# Patient Record
Sex: Male | Born: 1985 | Race: White | Hispanic: No | Marital: Married | Smoking: Never smoker
Health system: Southern US, Community
[De-identification: ages and names within clinical notes are randomized; demographics above are authoritative.]

## PROBLEM LIST (undated history)

## (undated) DIAGNOSIS — F509 Eating disorder, unspecified: Secondary | ICD-10-CM

## (undated) DIAGNOSIS — F1010 Alcohol abuse, uncomplicated: Secondary | ICD-10-CM

## (undated) DIAGNOSIS — F32A Depression, unspecified: Secondary | ICD-10-CM

## (undated) DIAGNOSIS — R569 Unspecified convulsions: Secondary | ICD-10-CM

## (undated) DIAGNOSIS — I10 Essential (primary) hypertension: Secondary | ICD-10-CM

## (undated) HISTORY — DX: Depression, unspecified: F32.A

## (undated) HISTORY — DX: Essential (primary) hypertension: I10

## (undated) HISTORY — DX: Eating disorder, unspecified: F50.9

---
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
## 2009-11-29 ENCOUNTER — Emergency Department: Payer: Self-pay | Admitting: Internal Medicine

## 2010-02-25 ENCOUNTER — Emergency Department: Payer: Self-pay | Admitting: Unknown Physician Specialty

## 2010-04-26 ENCOUNTER — Emergency Department: Payer: Self-pay | Admitting: Emergency Medicine

## 2010-06-15 ENCOUNTER — Emergency Department (HOSPITAL_COMMUNITY): Admission: EM | Admit: 2010-06-15 | Discharge: 2010-06-15 | Payer: Self-pay | Admitting: Emergency Medicine

## 2010-06-25 ENCOUNTER — Ambulatory Visit: Payer: Self-pay | Admitting: Family Medicine

## 2010-06-25 DIAGNOSIS — I10 Essential (primary) hypertension: Secondary | ICD-10-CM | POA: Insufficient documentation

## 2010-06-25 HISTORY — DX: Essential (primary) hypertension: I10

## 2010-11-30 ENCOUNTER — Telehealth: Payer: Self-pay | Admitting: Family Medicine

## 2010-12-24 NOTE — Assessment & Plan Note (Signed)
Summary: BRAND NEW PT/TO EST/CJR   Vital Signs:  Patient profile:   25 year old male Height:      66.25 inches Weight:      189 pounds BMI:     30.39 Temp:     98.4 degrees F oral Pulse rate:   80 / minute Pulse rhythm:   regular Resp:     12 per minute BP sitting:   162 / 100  (left arm) Cuff size:   regular  Vitals Entered By: Sid Falcon LPN (June 25, 2010 11:15 AM)  Nutrition Counseling: Patient's BMI is greater than 25 and therefore counseled on weight management options.    Serial Vital Signs/Assessments:  Time      Position  BP       Pulse  Resp  Temp     By                     154/100                        Evelena Peat MD  CC: New to establish   History of Present Illness: New pt to establish care.  Dxed about 2 months ago with severe hypertension.  Seen at [facility] and started  on Metoprolol and HCTZ.  BP initially reported at 240/130 per pt.  Not well controlled since then. Ran out of metoprolol today.  Has had some fatigue and headache issues since increased BP noted. Dealing with stress of mother dxed with lung cancer recently.  Has occ GERD but no other medical problems. Denies smoking, ETOH, or ilicit drug use.   Preventive Screening-Counseling & Management  Alcohol-Tobacco     Smoking Status: never  Caffeine-Diet-Exercise     Does Patient Exercise: no  Allergies (verified): No Known Drug Allergies  Past History:  Family History: Last updated: 06/25/2010 Family History Hypertension, father Family History Lung cancer, mother  Social History: Last updated: 06/25/2010 Occupation:  unemployed Single Never Smoked Alcohol use-no Regular exercise-no  Risk Factors: Exercise: no (06/25/2010)  Risk Factors: Smoking Status: never (06/25/2010)  Past Medical History:  eating disorder-?past hx of bulemia Hypertension PMH-FH-SH reviewed for relevance  Family History: Family History Hypertension, father Family History  Lung cancer, mother  Social History: Occupation:  unemployed Single Never Smoked Alcohol use-no Regular exercise-no Smoking Status:  never Occupation:  employed Does Patient Exercise:  no  Review of Systems  The patient denies anorexia, weight loss, weight gain, vision loss, chest pain, syncope, dyspnea on exertion, and peripheral edema.    Physical Exam  General:  Well-developed,well-nourished,in no acute distress; alert,appropriate and cooperative throughout examination Head:  Normocephalic and atraumatic without obvious abnormalities. No apparent alopecia or balding. Eyes:  pupils equal, pupils round, and pupils reactive to light.  Fundi benign-no hemorrhages. Neck:  No deformities, masses, or tenderness noted. Lungs:  Normal respiratory effort, chest expands symmetrically. Lungs are clear to auscultation, no crackles or wheezes. Heart:  normal rate, regular rhythm, and no murmur.   Extremities:  no edema. Neurologic:  alert & oriented X3, cranial nerves II-XII intact, and gait normal.     Impression & Recommendations:  Problem # 1:  HYPERTENSION (ICD-401.9) not to goal.  Cont metoprolol 50 mg once daily and add lisinopril hctz 20/12.5 one daily.  He will call with BP readings in 2 weeks.  He has no insurance and we will try to manage and titrate some by home readings. The following  medications were removed from the medication list:    Hydrochlorothiazide 25 Mg Tabs (Hydrochlorothiazide) ..... Once daily His updated medication list for this problem includes:    Metoprolol Tartrate 50 Mg Tabs (Metoprolol tartrate) ..... Once daily    Lisinopril-hydrochlorothiazide 20-12.5 Mg Tabs (Lisinopril-hydrochlorothiazide) ..... One by mouth once daily  Complete Medication List: 1)  Metoprolol Tartrate 50 Mg Tabs (Metoprolol tartrate) .... Once daily 2)  Lisinopril-hydrochlorothiazide 20-12.5 Mg Tabs (Lisinopril-hydrochlorothiazide) .... One by mouth once daily  Patient  Instructions: 1)  Check your  Blood Pressure regularly . If it is above:140/90   you should make an appointment. 2)  Limit your Sodium(salt) .  3)  Call in 2 weeks to give feedback about blood pressure. Prescriptions: METOPROLOL TARTRATE 50 MG TABS (METOPROLOL TARTRATE) once daily  #30 x 6   Entered and Authorized by:   Evelena Peat MD   Signed by:   Evelena Peat MD on 06/25/2010   Method used:   Electronically to        [facility] [address] [phone]* (retail)       [address] [place]  [address]       Ph: [phone]       Fax: [phone]   RxID:   [id number] LISINOPRIL-HYDROCHLOROTHIAZIDE 20-12.5 MG TABS (LISINOPRIL-HYDROCHLOROTHIAZIDE) one by mouth once daily  #30 x 6   Entered and Authorized by:   Evelena Peat MD   Signed by:   Evelena Peat MD on 06/25/2010   Method used:   Electronically to        [facility] [address] [phone]* (retail)       [address] [place], [place]  [address]       Ph: [phone]       Fax: [phone]   RxID:   [id number]   Preventive Care Screening  Last Tetanus Booster:    Date:  04/22/2010    Results:  Tdap

## 2010-12-24 NOTE — Progress Notes (Signed)
Summary: Pt called re: getting put back on diff type of bp med  Phone Note Call from Patient Call back at [facility] Phone [phone]   Caller: Patient Summary of Call: Pt called and would like to know when he could be put back on diff type of bp med? Pls call.  Initial call taken by: Lucy Antigua,  November 30, 2010 2:17 PM  Follow-up for Phone Call        We need to know how his BP is currently on present meds.  Need to make sure he is taking both meds.  He really needs office f/u but he has no insurance and we are trying to help with costs. Follow-up by: Evelena Peat MD,  November 30, 2010 3:21 PM  Additional Follow-up for Phone Call Additional follow up Details #1::        Pt says that he has been taking both meds and bp has been around  125/100, sometimes its 140/80 something. Pls advise.  Additional Follow-up by: Lucy Antigua,  November 30, 2010 4:18 PM    Additional Follow-up for Phone Call Additional follow up Details #2::    This is greatly improved from when we saw pt.  Why does he want to change meds if working? If he feels need to chnage we really need to reassess with office follow up. Follow-up by: Evelena Peat MD,  November 30, 2010 5:52 PM  Additional Follow-up for Phone Call Additional follow up Details #3:: Details for Additional Follow-up Action Taken: Lft vm for pt,notifying him of info noted above. Waiting on call back.  Additional Follow-up by: Lucy Antigua,  December 01, 2010 9:20 AM

## 2011-01-31 ENCOUNTER — Other Ambulatory Visit: Payer: Self-pay | Admitting: Family Medicine

## 2011-02-01 ENCOUNTER — Telehealth: Payer: Self-pay | Admitting: *Deleted

## 2011-02-01 DIAGNOSIS — I10 Essential (primary) hypertension: Secondary | ICD-10-CM

## 2011-02-01 MED ORDER — LISINOPRIL-HYDROCHLOROTHIAZIDE 20-12.5 MG PO TABS
1.0000 | ORAL_TABLET | Freq: Every day | ORAL | Status: DC
Start: 2011-02-01 — End: 2012-04-04

## 2011-02-01 MED ORDER — METOPROLOL TARTRATE 50 MG PO TABS: 50.0000 mg | ORAL_TABLET | Freq: Every day | ORAL | Status: DC

## 2011-02-01 NOTE — Telephone Encounter (Signed)
Pt BP have been running 120/80.  Filled med for 5 months and informed pt he will need annual CPX in August 1012, he voiced his understanding

## 2011-02-01 NOTE — Telephone Encounter (Signed)
Metoprolol 50mg  and Lisinopril 20mg  -12.5 out of meds.  [facility] in [place].

## 2011-03-19 ENCOUNTER — Emergency Department: Payer: Self-pay | Admitting: Emergency Medicine

## 2011-07-10 ENCOUNTER — Other Ambulatory Visit: Payer: Self-pay | Admitting: Family Medicine

## 2011-07-14 ENCOUNTER — Other Ambulatory Visit: Payer: Self-pay | Admitting: *Deleted

## 2011-07-14 ENCOUNTER — Emergency Department: Payer: Self-pay | Admitting: Internal Medicine

## 2011-07-14 MED ORDER — METOPROLOL TARTRATE 50 MG PO TABS: 50.0000 mg | ORAL_TABLET | Freq: Every day | ORAL | Status: DC

## 2011-09-11 ENCOUNTER — Other Ambulatory Visit: Payer: Self-pay | Admitting: Family Medicine

## 2011-09-14 ENCOUNTER — Other Ambulatory Visit: Payer: Self-pay | Admitting: *Deleted

## 2011-09-14 NOTE — Telephone Encounter (Signed)
Refill denied, pt has not been seen since 06/2010 and has been informed several times he needs return OV

## 2011-09-27 ENCOUNTER — Emergency Department: Payer: Self-pay | Admitting: Emergency Medicine

## 2011-10-13 ENCOUNTER — Other Ambulatory Visit: Payer: Self-pay | Admitting: Family Medicine

## 2011-11-21 ENCOUNTER — Other Ambulatory Visit: Payer: Self-pay | Admitting: Family Medicine

## 2011-11-26 ENCOUNTER — Encounter: Payer: Self-pay | Admitting: Family Medicine

## 2011-11-26 ENCOUNTER — Ambulatory Visit: Payer: Self-pay | Admitting: Family Medicine

## 2012-01-20 ENCOUNTER — Ambulatory Visit: Payer: Self-pay | Admitting: Family Medicine

## 2012-01-26 ENCOUNTER — Emergency Department: Payer: Self-pay | Admitting: Emergency Medicine

## 2012-02-20 ENCOUNTER — Emergency Department: Payer: Self-pay | Admitting: Emergency Medicine

## 2012-04-04 ENCOUNTER — Other Ambulatory Visit: Payer: Self-pay | Admitting: *Deleted

## 2012-05-14 ENCOUNTER — Emergency Department: Payer: Self-pay | Admitting: Emergency Medicine

## 2012-05-14 LAB — COMPREHENSIVE METABOLIC PANEL
Albumin: 4.6 g/dL (ref 3.4–5.0)
Anion Gap: 7 (ref 7–16)
BUN: 10 mg/dL (ref 7–18)
Calcium, Total: 9.3 mg/dL (ref 8.5–10.1)
Co2: 31 mmol/L (ref 21–32)
Creatinine: 0.89 mg/dL (ref 0.60–1.30)
EGFR (African American): >60
Glucose: 79 mg/dL (ref 65–99)
Potassium: 4.4 mmol/L (ref 3.5–5.1)
SGOT(AST): 16 U/L (ref 15–37)
SGPT (ALT): 22 U/L
Sodium: 140 mmol/L (ref 136–145)
Total Protein: 8 g/dL (ref 6.4–8.2)

## 2012-05-14 LAB — CBC
MCH: 30.4 pg (ref 26.0–34.0)
MCHC: 33 g/dL (ref 32.0–36.0)
RBC: 5.44 10*6/uL (ref 4.40–5.90)
WBC: 5 10*3/uL (ref 3.8–10.6)

## 2012-09-22 ENCOUNTER — Telehealth: Payer: Self-pay | Admitting: Family Medicine

## 2012-09-22 NOTE — Telephone Encounter (Signed)
Pt called requesting refill on BP meds. He has not been seen since 2011, so I told him he would probably need to come in for [facility] w/ Dr. Caryl Never first. He states he is out of meds. I offered him an appt today, but he refused. I sched for Monday at his [facility]. He said he needed refill on atenolol, but I only see metoprolol on list.  As I was talking to him, [facility] pharmacy called and spoke w/another scheduler. They stated that this pt called them and said we had authorized an emergency refill on his medicine, and that he had an appt (he did not have appt at this time). Pharmacy stated patient has been caught stealing his dad's medicine and lying to the pharmacy.  Pt's apt is Monday 11/4. He asked how much the visit cost, so he may be uninsured, and not sure if he will keep the appt. Please advise about refill without appt, etc.

## 2012-09-22 NOTE — Telephone Encounter (Signed)
Please advise 

## 2012-09-22 NOTE — Telephone Encounter (Signed)
Needs to be seen before further refills 

## 2012-09-22 NOTE — Telephone Encounter (Signed)
Attempt to call- the number listed in the chart - "has been disconnect or changed" cant leave msg for pt to make ROV to discuss meds and RFs

## 2012-09-22 NOTE — Telephone Encounter (Signed)
error 

## 2012-09-25 ENCOUNTER — Ambulatory Visit (INDEPENDENT_AMBULATORY_CARE_PROVIDER_SITE_OTHER): Payer: Self-pay | Admitting: Family Medicine

## 2012-09-25 ENCOUNTER — Encounter: Payer: Self-pay | Admitting: Family Medicine

## 2012-09-25 VITALS — BP 142/92 | Temp 98.0°F | Wt 174.0 lb

## 2012-09-25 DIAGNOSIS — S0003XA Contusion of scalp, initial encounter: Secondary | ICD-10-CM

## 2012-09-25 DIAGNOSIS — S1093XA Contusion of unspecified part of neck, initial encounter: Secondary | ICD-10-CM

## 2012-09-25 DIAGNOSIS — I10 Essential (primary) hypertension: Secondary | ICD-10-CM

## 2012-09-25 DIAGNOSIS — S0083XA Contusion of other part of head, initial encounter: Secondary | ICD-10-CM

## 2012-09-25 MED ORDER — LOSARTAN POTASSIUM 50 MG PO TABS: 50.0000 mg | ORAL_TABLET | Freq: Every day | ORAL | Status: DC

## 2012-09-25 MED ORDER — HYDROCODONE-ACETAMINOPHEN 5-325 MG PO TABS: ORAL_TABLET | ORAL | Status: DC

## 2012-09-25 MED ORDER — ATENOLOL 50 MG PO TABS: ORAL_TABLET | ORAL | Status: DC

## 2012-09-25 NOTE — Progress Notes (Signed)
  Subjective:    Patient ID: Adam [name], male    DOB: [birth year], 26 y.o.   MRN: [id number]  HPI  Patient seen for evaluation of hypertension. Had hypertension for quite some time and fell out of followup after losing insurance. He has recently been on atenolol 50 mg twice daily. He has some fatigue issues and erectile dysfunction.  Would like to explore other options. He apparently had tendency toward high pulse in the past. He denies any history of asthma. No history of depression. Previously was on lisinopril HCTZ but apparently still had somewhat borderline elevated blood pressure on that.  Accidentally hit left jaw with door 4 days ago. Pain with eating. Taking 800 mg ibuprofen but still had considerable soreness. Pain with chewing. No visible bruising.  Difficulty sleeping secondary to pain.   Review of Systems  Constitutional: Negative for fatigue.  Eyes: Negative for visual disturbance.  Respiratory: Negative for cough, chest tightness and shortness of breath.   Cardiovascular: Negative for chest pain, palpitations and leg swelling.  Neurological: Negative for dizziness, syncope, weakness, light-headedness and headaches.       Objective:   Physical Exam  Constitutional: He appears well-developed and well-nourished.  HENT:       Tender left TMJ joint. No visible bruising  Neck: Neck supple. No thyromegaly present.  Cardiovascular: Normal rate and regular rhythm.   No murmur heard. Pulmonary/Chest: Effort normal and breath sounds normal. No respiratory distress. He has no wheezes. He has no rales.  Musculoskeletal: He exhibits no edema.  Lymphadenopathy:    He has no cervical adenopathy.          Assessment & Plan:  #1 hypertension. Currently not well controlled. Start losartan 50 mg once daily. Continue atenolol but reduced to 50 mg one half tablet twice daily. He'll call with blood pressure readings in the next few weeks. #2 contusion left TMJ joint. Limited  hydrocodone 5/325 mg one or 2 every 6 hours when necessary for severe pain (disp #30 with no refill). Consider x-rays if not improving over the next week

## 2012-09-25 NOTE — Patient Instructions (Addendum)
Start Losartan 50 mg once daily Reduce atenolol 50 mg to one half twice daily.

## 2012-10-06 ENCOUNTER — Other Ambulatory Visit: Payer: Self-pay | Admitting: Family Medicine

## 2012-10-06 NOTE — Telephone Encounter (Signed)
Per Dr. Clent Ridges, okay to do a early refill. I called pharmacy and spoke with pt.

## 2012-10-06 NOTE — Telephone Encounter (Signed)
Pharm called b/c pt was there and wanted to get script filled early. Pt called later and states he is out of pills. He would like to get script filled today.

## 2012-10-11 ENCOUNTER — Telehealth: Payer: Self-pay | Admitting: Family Medicine

## 2012-10-11 DIAGNOSIS — R6884 Jaw pain: Secondary | ICD-10-CM

## 2012-10-11 NOTE — Telephone Encounter (Signed)
We need to consider x-rays mandible if still having this much pain.  OK to refill #20 but no additional refills.  Will need further evaluation if not improving.

## 2012-10-11 NOTE — Telephone Encounter (Signed)
Hydrocodone 1-2 tabs every 6 hours prn pain  last filled at [facility] on 09-25-12, #30 with 0 refills

## 2012-10-11 NOTE — Telephone Encounter (Signed)
Patient called stating that he need a refill of his hydrocodone HYDROcodone-acetaminophen (NORCO/VICODIN) 5-325 MG per tablet [[id number]] : 1-2 po q 6 hours prn pain. Please assist.

## 2012-10-12 MED ORDER — HYDROCODONE-ACETAMINOPHEN 5-325 MG PO TABS: ORAL_TABLET | ORAL | Status: DC

## 2012-10-12 NOTE — Telephone Encounter (Signed)
I attempted PC to inform pt, home phone no longer in service, cell VM not set up yet.

## 2012-10-16 NOTE — Telephone Encounter (Signed)
1)  Let's increase his Losartan to 100 mg po daily 2)  Cannot justify further opioids for jaw pain without at least some plain films.  We do not doubt his pain, but without fracture would be unusual to have this level of pain.       We need to avoid prolonged use (of opioids) to avoid dependence.  I will be happy to order some mandible films if he is willing to go.

## 2012-10-16 NOTE — Telephone Encounter (Signed)
Pt called back with updated phone number, message given.  Pt states Dr Caryl Never wrote him out a Rx at [facility] for Percocet 5 mg.  He said you would remember the story about his father letting him try his.  He did have the Hydrocodone acetaminophen (Norco/Vicodin) filled, did not realize until after he opened it that it was not the same med, "Vicodin makes him itch".  I explained to pt we have no record of the percocet on his chart.  Also, pt states the Losartan was not working for him, BP running 170/100, so he has been taking 2 daily and seems to be effective now.  He did stop the atenolol.  With all these changes and Dr Lucie Leather message for pt, I asked him to return for OV soon, he stated he cannot do that as he does not have insurance.

## 2012-10-16 NOTE — Telephone Encounter (Signed)
Pt call be reached on cell [phone]

## 2012-10-17 NOTE — Telephone Encounter (Signed)
Pt cell VM has not been set up yet, not able to inform him.  Will try again tomorrow

## 2012-10-18 MED ORDER — LOSARTAN POTASSIUM 100 MG PO TABS: 100.0000 mg | ORAL_TABLET | Freq: Every day | ORAL | Status: DC

## 2012-10-18 NOTE — Telephone Encounter (Signed)
VM has not been set up yet, I tried calling again today

## 2012-10-18 NOTE — Telephone Encounter (Signed)
Pt finally called back, I asked him again to set up his VM so I could leave him messages  .Losartan 100 mg sent to [facility], pt agreed to left mandible. Pt agreed to left jaw x-rays, I was going to order, however several choices for views.

## 2012-10-23 NOTE — Telephone Encounter (Signed)
I ordered 4 view of mandible.  We need to make sure they do these at [facility].

## 2012-10-24 NOTE — Telephone Encounter (Signed)
Pt informed where to go for x-ray of jaw

## 2012-11-03 ENCOUNTER — Telehealth: Payer: Self-pay | Admitting: Family Medicine

## 2012-11-03 NOTE — Telephone Encounter (Signed)
Pt did not complete jaw films ordered 10/20/12

## 2013-06-07 ENCOUNTER — Other Ambulatory Visit: Payer: Self-pay | Admitting: Family Medicine

## 2013-06-14 ENCOUNTER — Ambulatory Visit: Payer: Self-pay | Admitting: Family Medicine

## 2013-06-14 DIAGNOSIS — Z0289 Encounter for other administrative examinations: Secondary | ICD-10-CM

## 2013-07-20 ENCOUNTER — Ambulatory Visit (INDEPENDENT_AMBULATORY_CARE_PROVIDER_SITE_OTHER): Payer: Self-pay | Admitting: Family Medicine

## 2013-07-20 ENCOUNTER — Encounter: Payer: Self-pay | Admitting: Family Medicine

## 2013-07-20 VITALS — BP 150/88 | HR 84 | Temp 98.0°F | Wt 159.0 lb

## 2013-07-20 DIAGNOSIS — N529 Male erectile dysfunction, unspecified: Secondary | ICD-10-CM

## 2013-07-20 DIAGNOSIS — I10 Essential (primary) hypertension: Secondary | ICD-10-CM

## 2013-07-20 MED ORDER — SILDENAFIL CITRATE 100 MG PO TABS: 100.0000 mg | ORAL_TABLET | Freq: Every day | ORAL | Status: DC | PRN

## 2013-07-20 MED ORDER — LOSARTAN POTASSIUM 100 MG PO TABS: 100.0000 mg | ORAL_TABLET | Freq: Every day | ORAL | Status: DC

## 2013-07-20 NOTE — Patient Instructions (Addendum)

## 2013-07-20 NOTE — Progress Notes (Signed)
  Subjective:    Patient ID: Adam [name], male    DOB: Aug 17, 1986, 27 y.o.   MRN: [id number]  HPI Followup hypertension. Patient takes losartan 100 mg daily. Generally compliant with therapy but did not take medicine this morning. His blood pressures have been consistently 120s to 130s systolic. Generally feels well. Drinks occasional alcohol but not daily. Nonsmoker. Exercising regularly  Has had some problems with erectile dysfunction. We have tapered him off atenolol because of that previously. He has good libido. Able to get erection but difficulty maintaining. Good energy levels. No specific stressors.  Past Medical History  Diagnosis Date  . HYPERTENSION 06/25/2010  . Eating disorder     history of bulemia   No past surgical history on file.  reports that he has never smoked. He does not have any smokeless tobacco history on file. His alcohol and drug histories are not on file. family history includes Cancer in his mother; Hypertension in his father. No Known Allergies    Review of Systems  Constitutional: Negative for appetite change, fatigue and unexpected weight change.  Eyes: Negative for visual disturbance.  Respiratory: Negative for cough, chest tightness and shortness of breath.   Cardiovascular: Negative for chest pain, palpitations and leg swelling.  Neurological: Negative for dizziness, syncope, weakness, light-headedness and headaches.       Objective:   Physical Exam  Constitutional: He is oriented to person, place, and time. He appears well-developed and well-nourished.  Neck: Neck supple. No thyromegaly present.  Cardiovascular: Normal rate and regular rhythm.   No murmur heard. Pulmonary/Chest: Effort normal and breath sounds normal. No respiratory distress. He has no wheezes. He has no rales.  Musculoskeletal: He exhibits no edema.  Neurological: He is alert and oriented to person, place, and time. No cranial nerve deficit.  Psychiatric: He has a  normal mood and affect. His behavior is normal.          Assessment & Plan:  #1 hypertension. Slightly up today but generally controlled by home readings. He did not take medication today. Continue current regimen. Refill medication for one year. Be in touch if consistently over 140/90 #2 erectile dysfunction. Suspect psychological component. Prescription for Viagra 100 mg one half to one tablet daily as needed.

## 2014-10-30 ENCOUNTER — Emergency Department: Payer: Self-pay | Admitting: Emergency Medicine

## 2019-04-28 ENCOUNTER — Inpatient Hospital Stay (HOSPITAL_COMMUNITY)
Admission: EM | Admit: 2019-04-28 | Discharge: 2019-04-30 | DRG: 200 | Disposition: A | Discharge status: Home / Self Care | Payer: Self-pay | Attending: General Surgery | Admitting: General Surgery

## 2019-04-28 ENCOUNTER — Other Ambulatory Visit: Payer: Self-pay

## 2019-04-28 ENCOUNTER — Encounter (HOSPITAL_COMMUNITY): Payer: Self-pay | Admitting: *Deleted

## 2019-04-28 ENCOUNTER — Observation Stay (HOSPITAL_COMMUNITY): Payer: Self-pay

## 2019-04-28 ENCOUNTER — Emergency Department (HOSPITAL_COMMUNITY): Payer: Self-pay

## 2019-04-28 DIAGNOSIS — I10 Essential (primary) hypertension: Secondary | ICD-10-CM | POA: Diagnosis present

## 2019-04-28 DIAGNOSIS — Z1159 Encounter for screening for other viral diseases: Secondary | ICD-10-CM

## 2019-04-28 DIAGNOSIS — Y92239 Unspecified place in hospital as the place of occurrence of the external cause: Secondary | ICD-10-CM | POA: Diagnosis not present

## 2019-04-28 DIAGNOSIS — Z23 Encounter for immunization: Secondary | ICD-10-CM

## 2019-04-28 DIAGNOSIS — R112 Nausea with vomiting, unspecified: Secondary | ICD-10-CM | POA: Diagnosis not present

## 2019-04-28 DIAGNOSIS — S2232XA Fracture of one rib, left side, initial encounter for closed fracture: Secondary | ICD-10-CM | POA: Diagnosis present

## 2019-04-28 DIAGNOSIS — J939 Pneumothorax, unspecified: Secondary | ICD-10-CM | POA: Diagnosis present

## 2019-04-28 DIAGNOSIS — S21112A Laceration without foreign body of left front wall of thorax without penetration into thoracic cavity, initial encounter: Secondary | ICD-10-CM | POA: Diagnosis present

## 2019-04-28 DIAGNOSIS — F17200 Nicotine dependence, unspecified, uncomplicated: Secondary | ICD-10-CM | POA: Diagnosis present

## 2019-04-28 DIAGNOSIS — D62 Acute posthemorrhagic anemia: Secondary | ICD-10-CM | POA: Diagnosis present

## 2019-04-28 DIAGNOSIS — F1010 Alcohol abuse, uncomplicated: Secondary | ICD-10-CM | POA: Diagnosis present

## 2019-04-28 DIAGNOSIS — R7989 Other specified abnormal findings of blood chemistry: Secondary | ICD-10-CM

## 2019-04-28 DIAGNOSIS — E876 Hypokalemia: Secondary | ICD-10-CM | POA: Diagnosis present

## 2019-04-28 DIAGNOSIS — S21111A Laceration without foreign body of right front wall of thorax without penetration into thoracic cavity, initial encounter: Secondary | ICD-10-CM | POA: Diagnosis present

## 2019-04-28 DIAGNOSIS — S270XXA Traumatic pneumothorax, initial encounter: Principal | ICD-10-CM | POA: Diagnosis present

## 2019-04-28 DIAGNOSIS — T402X5A Adverse effect of other opioids, initial encounter: Secondary | ICD-10-CM | POA: Diagnosis not present

## 2019-04-28 HISTORY — DX: Essential (primary) hypertension: I10

## 2019-04-28 LAB — TYPE AND SCREEN
ABO/RH(D): A NEG
Antibody Screen: NEGATIVE
Unit division: 0
Unit division: 0

## 2019-04-28 LAB — COMPREHENSIVE METABOLIC PANEL
ALT: 18 U/L (ref 0–44)
AST: 26 U/L (ref 15–41)
Albumin: 4.3 g/dL (ref 3.5–5.0)
Alkaline Phosphatase: 62 U/L (ref 38–126)
Anion gap: 16 — ABNORMAL HIGH (ref 5–15)
BUN: 10 mg/dL (ref 6–20)
CO2: 25 mmol/L (ref 22–32)
Calcium: 9.6 mg/dL (ref 8.9–10.3)
Chloride: 100 mmol/L (ref 98–111)
Creatinine, Ser: 1.2 mg/dL (ref 0.61–1.24)
GFR calc Af Amer: >60 mL/min (ref >60)
GFR calc non Af Amer: >60 mL/min (ref >60)
Glucose, Bld: 144 mg/dL — ABNORMAL HIGH (ref 70–99)
Potassium: 3.2 mmol/L — ABNORMAL LOW (ref 3.5–5.1)
Sodium: 141 mmol/L (ref 135–145)
Total Bilirubin: 0.8 mg/dL (ref 0.3–1.2)
Total Protein: 6.5 g/dL (ref 6.5–8.1)

## 2019-04-28 LAB — CBC
HCT: 42.8 % (ref 39.0–52.0)
Hemoglobin: 15.1 g/dL (ref 13.0–17.0)
MCH: 31.9 pg (ref 26.0–34.0)
MCHC: 35.3 g/dL (ref 30.0–36.0)
MCV: 90.5 fL (ref 80.0–100.0)
Platelets: 265 10*3/uL (ref 150–400)
RBC: 4.73 MIL/uL (ref 4.22–5.81)
RDW: 11.1 % — ABNORMAL LOW (ref 11.5–15.5)
WBC: 6.3 10*3/uL (ref 4.0–10.5)
nRBC: 0 % (ref 0.0–0.2)

## 2019-04-28 LAB — CDS SEROLOGY

## 2019-04-28 LAB — URINALYSIS, ROUTINE W REFLEX MICROSCOPIC
Bilirubin Urine: NEGATIVE
Glucose, UA: NEGATIVE mg/dL
Hgb urine dipstick: NEGATIVE
Ketones, ur: NEGATIVE mg/dL
Leukocytes,Ua: NEGATIVE
Nitrite: NEGATIVE
Protein, ur: NEGATIVE mg/dL
Specific Gravity, Urine: 1.029 (ref 1.005–1.030)
pH: 7 (ref 5.0–8.0)

## 2019-04-28 LAB — BPAM FFP
Blood Product Expiration Date: 202006102359
Blood Product Expiration Date: 202006102359
ISSUE DATE / TIME: 202006060257
ISSUE DATE / TIME: 202006060257
Unit Type and Rh: 6200
Unit Type and Rh: 6200

## 2019-04-28 LAB — PROTIME-INR
INR: 1 (ref 0.8–1.2)
Prothrombin Time: 12.9 seconds (ref 11.4–15.2)

## 2019-04-28 LAB — BPAM RBC
Blood Product Expiration Date: 202006242359
Blood Product Expiration Date: 202006242359
ISSUE DATE / TIME: 202006060257
ISSUE DATE / TIME: 202006060257
Unit Type and Rh: 5100
Unit Type and Rh: 5100

## 2019-04-28 LAB — PREPARE FRESH FROZEN PLASMA
Unit division: 0
Unit division: 0

## 2019-04-28 LAB — I-STAT CHEM 8, ED
BUN: 11 mg/dL (ref 6–20)
Calcium, Ion: 1.09 mmol/L — ABNORMAL LOW (ref 1.15–1.40)
Chloride: 98 mmol/L (ref 98–111)
Creatinine, Ser: 1.2 mg/dL (ref 0.61–1.24)
Glucose, Bld: 144 mg/dL — ABNORMAL HIGH (ref 70–99)
HCT: 45 % (ref 39.0–52.0)
Hemoglobin: 15.3 g/dL (ref 13.0–17.0)
Potassium: 3.2 mmol/L — ABNORMAL LOW (ref 3.5–5.1)
Sodium: 138 mmol/L (ref 135–145)
TCO2: 24 mmol/L (ref 22–32)

## 2019-04-28 LAB — ETHANOL: Alcohol, Ethyl (B): 80 mg/dL — ABNORMAL HIGH (ref <10)

## 2019-04-28 LAB — BLOOD PRODUCT ORDER (VERBAL) VERIFICATION

## 2019-04-28 LAB — ABO/RH: ABO/RH(D): A NEG

## 2019-04-28 LAB — MRSA PCR SCREENING: MRSA by PCR: NEGATIVE

## 2019-04-28 LAB — SARS CORONAVIRUS 2 BY RT PCR (HOSPITAL ORDER, PERFORMED IN ~~LOC~~ HOSPITAL LAB): SARS Coronavirus 2: NEGATIVE

## 2019-04-28 LAB — LACTIC ACID, PLASMA: Lactic Acid, Venous: 4.7 mmol/L (ref 0.5–1.9)

## 2019-04-28 MED ORDER — MORPHINE SULFATE (PF) 4 MG/ML IV SOLN
4.0000 mg | Freq: Once | INTRAVENOUS | Status: AC
  Administered 2019-04-28: 04:00:00 4 mg via INTRAVENOUS
  Filled 2019-04-28: qty 1

## 2019-04-28 MED ORDER — ONDANSETRON HCL 4 MG/2ML IJ SOLN
4.0000 mg | Freq: Once | INTRAMUSCULAR | Status: AC
  Administered 2019-04-28: 4 mg via INTRAVENOUS

## 2019-04-28 MED ORDER — SODIUM CHLORIDE 0.9 % IV BOLUS
1000.0000 mL | Freq: Once | INTRAVENOUS | Status: AC
  Administered 2019-04-28: 04:00:00 1000 mL via INTRAVENOUS

## 2019-04-28 MED ORDER — METHOCARBAMOL 500 MG PO TABS
500.0000 mg | ORAL_TABLET | Freq: Four times a day (QID) | ORAL | Status: DC | PRN
  Administered 2019-04-28: 500 mg via ORAL
  Filled 2019-04-28 (×2): qty 1

## 2019-04-28 MED ORDER — ONDANSETRON 4 MG PO TBDP
4.0000 mg | ORAL_TABLET | Freq: Four times a day (QID) | ORAL | Status: DC | PRN
  Administered 2019-04-29: 4 mg via ORAL
  Filled 2019-04-28 (×2): qty 1

## 2019-04-28 MED ORDER — SODIUM CHLORIDE 0.9 % IV SOLN
INTRAVENOUS | Status: DC
  Administered 2019-04-28: 1000 mL via INTRAVENOUS
  Administered 2019-04-28: 06:00:00 via INTRAVENOUS

## 2019-04-28 MED ORDER — HYDRALAZINE HCL 20 MG/ML IJ SOLN: 10.0000 mg | INTRAMUSCULAR | Status: DC | PRN

## 2019-04-28 MED ORDER — THIAMINE HCL 100 MG/ML IJ SOLN: 100.0000 mg | Freq: Every day | INTRAMUSCULAR | Status: DC

## 2019-04-28 MED ORDER — GABAPENTIN 300 MG PO CAPS
300.0000 mg | ORAL_CAPSULE | Freq: Three times a day (TID) | ORAL | Status: DC
  Administered 2019-04-28 – 2019-04-30 (×7): 300 mg via ORAL
  Filled 2019-04-28 (×7): qty 1

## 2019-04-28 MED ORDER — LORAZEPAM 1 MG PO TABS
1.0000 mg | ORAL_TABLET | Freq: Four times a day (QID) | ORAL | Status: DC | PRN
  Administered 2019-04-28 – 2019-04-29 (×3): 1 mg via ORAL
  Filled 2019-04-28 (×3): qty 1

## 2019-04-28 MED ORDER — ACETAMINOPHEN 325 MG PO TABS
650.0000 mg | ORAL_TABLET | Freq: Four times a day (QID) | ORAL | Status: DC
  Administered 2019-04-28 – 2019-04-30 (×10): 650 mg via ORAL
  Filled 2019-04-28 (×10): qty 2

## 2019-04-28 MED ORDER — OXYCODONE HCL 5 MG PO TABS
5.0000 mg | ORAL_TABLET | ORAL | Status: DC | PRN
  Administered 2019-04-28 – 2019-04-29 (×3): 5 mg via ORAL
  Filled 2019-04-28 (×3): qty 1

## 2019-04-28 MED ORDER — ADULT MULTIVITAMIN W/MINERALS CH
1.0000 | ORAL_TABLET | Freq: Every day | ORAL | Status: DC
  Administered 2019-04-28 – 2019-04-30 (×3): 1 via ORAL
  Filled 2019-04-28 (×3): qty 1

## 2019-04-28 MED ORDER — IBUPROFEN 200 MG PO TABS
800.0000 mg | ORAL_TABLET | Freq: Three times a day (TID) | ORAL | Status: DC
  Administered 2019-04-28 (×3): 800 mg via ORAL
  Filled 2019-04-28 (×4): qty 4

## 2019-04-28 MED ORDER — TETANUS-DIPHTH-ACELL PERTUSSIS 5-2.5-18.5 LF-MCG/0.5 IM SUSP
0.5000 mL | Freq: Once | INTRAMUSCULAR | Status: AC
  Administered 2019-04-28: 0.5 mL via INTRAMUSCULAR
  Filled 2019-04-28: qty 0.5

## 2019-04-28 MED ORDER — METOPROLOL TARTRATE 5 MG/5ML IV SOLN: 5.0000 mg | Freq: Four times a day (QID) | INTRAVENOUS | Status: DC | PRN

## 2019-04-28 MED ORDER — LORAZEPAM 2 MG/ML IJ SOLN: 1.0000 mg | Freq: Four times a day (QID) | INTRAMUSCULAR | Status: DC | PRN

## 2019-04-28 MED ORDER — DOCUSATE SODIUM 100 MG PO CAPS
100.0000 mg | ORAL_CAPSULE | Freq: Two times a day (BID) | ORAL | Status: DC
  Administered 2019-04-28 – 2019-04-30 (×5): 100 mg via ORAL
  Filled 2019-04-28 (×5): qty 1

## 2019-04-28 MED ORDER — ONDANSETRON HCL 4 MG/2ML IJ SOLN
4.0000 mg | Freq: Four times a day (QID) | INTRAMUSCULAR | Status: DC | PRN
  Administered 2019-04-28 – 2019-04-29 (×2): 4 mg via INTRAVENOUS
  Filled 2019-04-28 (×2): qty 2

## 2019-04-28 MED ORDER — HYDROMORPHONE HCL 1 MG/ML IJ SOLN
0.5000 mg | INTRAMUSCULAR | Status: DC | PRN
  Administered 2019-04-28 (×2): 0.5 mg via INTRAVENOUS
  Filled 2019-04-28 (×2): qty 1

## 2019-04-28 MED ORDER — VITAMIN B-1 100 MG PO TABS
100.0000 mg | ORAL_TABLET | Freq: Every day | ORAL | Status: DC
  Administered 2019-04-28 – 2019-04-30 (×3): 100 mg via ORAL
  Filled 2019-04-28 (×3): qty 1

## 2019-04-28 MED ORDER — FOLIC ACID 1 MG PO TABS
1.0000 mg | ORAL_TABLET | Freq: Every day | ORAL | Status: DC
  Administered 2019-04-28 – 2019-04-30 (×3): 1 mg via ORAL
  Filled 2019-04-28 (×3): qty 1

## 2019-04-28 MED ORDER — ONDANSETRON HCL 4 MG/2ML IJ SOLN
INTRAMUSCULAR | Status: AC
  Filled 2019-04-28: qty 2

## 2019-04-28 MED ORDER — IOHEXOL 300 MG/ML  SOLN
100.0000 mL | Freq: Once | INTRAMUSCULAR | Status: AC | PRN
  Administered 2019-04-28: 100 mL via INTRAVENOUS

## 2019-04-28 NOTE — Consult Note (Signed)
Responded to Lvl 1 page. Pt unavailable, no family present. Staff will page again if chaplain services desired.  Rev. Eloise Levels Chaplain

## 2019-04-28 NOTE — ED Notes (Signed)
Pt's family would like updates ASAP. Pt's sister Wells Guiles) phone number: [phone]. Pt's other sister Keane Scrape) phone number: [phone].
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
## 2019-04-28 NOTE — ED Notes (Signed)
Dr. Roxanne Mins notified of Lactic Acid 4.7.

## 2019-04-28 NOTE — Progress Notes (Signed)
CSW met with patient to assess for substance use concerns. CSW noted patient stated he did not want to talk and requested CSW come back at a later time for assessment.  Lamonte Richer, LCSW, [place] Worker II [phone]

## 2019-04-28 NOTE — ED Notes (Signed)
Pt speaking with sheriff at bedside

## 2019-04-28 NOTE — H&P (Signed)
Please see my note "Consult Note" from 6/6 03:22am, this is the H&P which was erroneously labeled.

## 2019-04-28 NOTE — Progress Notes (Signed)
Sheriff brought order for [facility] to disclose medical information. Paper left in chart. Will pass on in report. Sheriff requesting to be contacted if pt was to try to leave or have discharge order.

## 2019-04-28 NOTE — ED Notes (Signed)
Spoke to [employer], pt's sister, via phone and ok with patient. Informed sister currently waiting on results and would contact her when plan of care is made

## 2019-04-28 NOTE — ED Notes (Signed)
Pt placed on NRB for comfort and sob

## 2019-04-28 NOTE — ED Notes (Signed)
Sheriff's officer requesting to be contacted if pt was to try to leave

## 2019-04-28 NOTE — Progress Notes (Signed)
Pt arrived to unit. VSS stable. Oriented to room and call bell. Call bell within reach. Pt has wallet with him with over $2000 cash. Pt refused to have wallet locked up in security. RN educated pt on Therapist, music. Pt still requesting to keep wallet at bedside. Will continue to monitor.

## 2019-04-28 NOTE — ED Notes (Addendum)
Pt aware of need for urine sample, urinal at bedside, call button within reach
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
## 2019-04-29 ENCOUNTER — Observation Stay (HOSPITAL_COMMUNITY): Payer: Self-pay

## 2019-04-29 LAB — CBC
HCT: 31.6 % — ABNORMAL LOW (ref 39.0–52.0)
Hemoglobin: 10.8 g/dL — ABNORMAL LOW (ref 13.0–17.0)
MCH: 32.2 pg (ref 26.0–34.0)
MCHC: 34.2 g/dL (ref 30.0–36.0)
MCV: 94.3 fL (ref 80.0–100.0)
Platelets: 144 10*3/uL — ABNORMAL LOW (ref 150–400)
RBC: 3.35 MIL/uL — ABNORMAL LOW (ref 4.22–5.81)
RDW: 11.2 % — ABNORMAL LOW (ref 11.5–15.5)
WBC: 4.5 10*3/uL (ref 4.0–10.5)
nRBC: 0 % (ref 0.0–0.2)

## 2019-04-29 LAB — BASIC METABOLIC PANEL
Anion gap: 5 (ref 5–15)
BUN: 11 mg/dL (ref 6–20)
CO2: 27 mmol/L (ref 22–32)
Calcium: 8.5 mg/dL — ABNORMAL LOW (ref 8.9–10.3)
Chloride: 106 mmol/L (ref 98–111)
Creatinine, Ser: 0.93 mg/dL (ref 0.61–1.24)
GFR calc Af Amer: >60 mL/min (ref >60)
GFR calc non Af Amer: >60 mL/min (ref >60)
Glucose, Bld: 104 mg/dL — ABNORMAL HIGH (ref 70–99)
Potassium: 4.5 mmol/L (ref 3.5–5.1)
Sodium: 138 mmol/L (ref 135–145)

## 2019-04-29 LAB — HIV ANTIBODY (ROUTINE TESTING W REFLEX): HIV Screen 4th Generation wRfx: NONREACTIVE

## 2019-04-29 MED ORDER — METHOCARBAMOL 750 MG PO TABS
750.0000 mg | ORAL_TABLET | Freq: Three times a day (TID) | ORAL | Status: DC
  Administered 2019-04-29 – 2019-04-30 (×4): 750 mg via ORAL
  Filled 2019-04-29 (×4): qty 1

## 2019-04-29 MED ORDER — OXYCODONE HCL 5 MG PO TABS
5.0000 mg | ORAL_TABLET | ORAL | Status: DC | PRN
  Administered 2019-04-29 – 2019-04-30 (×6): 10 mg via ORAL
  Filled 2019-04-29: qty 2
  Filled 2019-04-29: qty 1
  Filled 2019-04-29 (×2): qty 2
  Filled 2019-04-29: qty 1
  Filled 2019-04-29 (×2): qty 2

## 2019-04-29 MED ORDER — POTASSIUM CHLORIDE CRYS ER 20 MEQ PO TBCR
20.0000 meq | EXTENDED_RELEASE_TABLET | Freq: Two times a day (BID) | ORAL | Status: DC
  Administered 2019-04-29 – 2019-04-30 (×3): 20 meq via ORAL
  Filled 2019-04-29 (×3): qty 1

## 2019-04-29 MED ORDER — POLYETHYLENE GLYCOL 3350 17 G PO PACK
17.0000 g | PACK | Freq: Every day | ORAL | Status: DC
  Administered 2019-04-29 – 2019-04-30 (×2): 17 g via ORAL
  Filled 2019-04-29 (×2): qty 1

## 2019-04-29 MED ORDER — MORPHINE SULFATE (PF) 2 MG/ML IV SOLN: 2.0000 mg | INTRAVENOUS | Status: DC | PRN
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
## 2019-04-30 ENCOUNTER — Inpatient Hospital Stay (HOSPITAL_COMMUNITY): Payer: Self-pay

## 2019-04-30 LAB — BASIC METABOLIC PANEL
Anion gap: 7 (ref 5–15)
BUN: 9 mg/dL (ref 6–20)
CO2: 30 mmol/L (ref 22–32)
Calcium: 8.8 mg/dL — ABNORMAL LOW (ref 8.9–10.3)
Chloride: 101 mmol/L (ref 98–111)
Creatinine, Ser: 1.08 mg/dL (ref 0.61–1.24)
GFR calc Af Amer: >60 mL/min (ref >60)
GFR calc non Af Amer: >60 mL/min (ref >60)
Glucose, Bld: 96 mg/dL (ref 70–99)
Potassium: 4.4 mmol/L (ref 3.5–5.1)
Sodium: 138 mmol/L (ref 135–145)

## 2019-04-30 LAB — CBC
HCT: 33.1 % — ABNORMAL LOW (ref 39.0–52.0)
Hemoglobin: 11.3 g/dL — ABNORMAL LOW (ref 13.0–17.0)
MCH: 31.7 pg (ref 26.0–34.0)
MCHC: 34.1 g/dL (ref 30.0–36.0)
MCV: 93 fL (ref 80.0–100.0)
Platelets: 161 10*3/uL (ref 150–400)
RBC: 3.56 MIL/uL — ABNORMAL LOW (ref 4.22–5.81)
RDW: 10.8 % — ABNORMAL LOW (ref 11.5–15.5)
WBC: 5.5 10*3/uL (ref 4.0–10.5)
nRBC: 0 % (ref 0.0–0.2)

## 2019-04-30 MED ORDER — ACETAMINOPHEN 325 MG PO TABS: 650.0000 mg | ORAL_TABLET | Freq: Four times a day (QID) | ORAL | Status: DC

## 2019-04-30 MED ORDER — OXYCODONE HCL 5 MG PO TABS: 5.0000 mg | ORAL_TABLET | ORAL | 0 refills | Status: DC | PRN

## 2019-04-30 MED ORDER — METHOCARBAMOL 750 MG PO TABS: 750.0000 mg | ORAL_TABLET | Freq: Three times a day (TID) | ORAL | 0 refills | Status: DC

## 2019-04-30 MED ORDER — GABAPENTIN 300 MG PO CAPS: 300.0000 mg | ORAL_CAPSULE | Freq: Three times a day (TID) | ORAL | 0 refills | Status: DC

## 2019-04-30 MED ORDER — IBUPROFEN 200 MG PO TABS: 600.0000 mg | ORAL_TABLET | Freq: Three times a day (TID) | ORAL | Status: DC | PRN

## 2019-04-30 MED FILL — oxyCODONE HCL 5 MG TABS: 5 | 4 days supply | Qty: 30 | Fill #0

## 2019-04-30 MED FILL — METHOCARBAMOL 750 MG TABS: 750 | 30 days supply | Qty: 90 | Fill #0

## 2019-04-30 MED FILL — GABAPENTIN 300 MG CAPSULE: 300 | 30 days supply | Qty: 90 | Fill #0
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
## 2019-04-30 NOTE — Progress Notes (Addendum)
Pt with discharge orders. [facility] office called to inform of the discharge according to form to disclose medical information to [facility] . RN went over discharge paperwork with pt and all questions answered. IV removed. Prescriptions electronically sent to outpatient pharmacy. Pt assisted with paper scrubs. Sheriff present after discharge information reviewed with pt and RN exited room. NT escorted pt out of hospital via wheelchair, pt wearing paper scrub pants and gown.

## 2019-05-01 ENCOUNTER — Encounter: Payer: Self-pay | Admitting: Family Medicine

## 2019-05-05 ENCOUNTER — Emergency Department (HOSPITAL_COMMUNITY): Payer: Self-pay

## 2019-05-05 ENCOUNTER — Encounter (HOSPITAL_COMMUNITY): Payer: Self-pay | Admitting: Emergency Medicine

## 2019-05-05 ENCOUNTER — Other Ambulatory Visit: Payer: Self-pay

## 2019-05-05 ENCOUNTER — Emergency Department (HOSPITAL_COMMUNITY)
Admission: EM | Admit: 2019-05-05 | Discharge: 2019-05-05 | Disposition: A | Discharge status: Home / Self Care | Payer: Self-pay | Attending: Emergency Medicine | Admitting: Emergency Medicine

## 2019-05-05 DIAGNOSIS — Y999 Unspecified external cause status: Secondary | ICD-10-CM | POA: Insufficient documentation

## 2019-05-05 DIAGNOSIS — Y929 Unspecified place or not applicable: Secondary | ICD-10-CM | POA: Insufficient documentation

## 2019-05-05 DIAGNOSIS — F17290 Nicotine dependence, other tobacco product, uncomplicated: Secondary | ICD-10-CM | POA: Insufficient documentation

## 2019-05-05 DIAGNOSIS — I10 Essential (primary) hypertension: Secondary | ICD-10-CM | POA: Insufficient documentation

## 2019-05-05 DIAGNOSIS — Y939 Activity, unspecified: Secondary | ICD-10-CM | POA: Insufficient documentation

## 2019-05-05 DIAGNOSIS — Z79899 Other long term (current) drug therapy: Secondary | ICD-10-CM | POA: Insufficient documentation

## 2019-05-05 DIAGNOSIS — S2232XA Fracture of one rib, left side, initial encounter for closed fracture: Secondary | ICD-10-CM | POA: Insufficient documentation

## 2019-05-05 LAB — CBC
HCT: 36.9 % — ABNORMAL LOW (ref 39.0–52.0)
Hemoglobin: 12.7 g/dL — ABNORMAL LOW (ref 13.0–17.0)
MCH: 31.8 pg (ref 26.0–34.0)
MCHC: 34.4 g/dL (ref 30.0–36.0)
MCV: 92.5 fL (ref 80.0–100.0)
Platelets: 270 10*3/uL (ref 150–400)
RBC: 3.99 MIL/uL — ABNORMAL LOW (ref 4.22–5.81)
RDW: 11.9 % (ref 11.5–15.5)
WBC: 5.3 10*3/uL (ref 4.0–10.5)
nRBC: 0 % (ref 0.0–0.2)

## 2019-05-05 LAB — BASIC METABOLIC PANEL
Anion gap: 9 (ref 5–15)
BUN: 17 mg/dL (ref 6–20)
CO2: 24 mmol/L (ref 22–32)
Calcium: 9 mg/dL (ref 8.9–10.3)
Chloride: 102 mmol/L (ref 98–111)
Creatinine, Ser: 0.94 mg/dL (ref 0.61–1.24)
GFR calc Af Amer: >60 mL/min (ref >60)
GFR calc non Af Amer: >60 mL/min (ref >60)
Glucose, Bld: 81 mg/dL (ref 70–99)
Potassium: 4.3 mmol/L (ref 3.5–5.1)
Sodium: 135 mmol/L (ref 135–145)

## 2019-05-05 MED ORDER — OXYCODONE-ACETAMINOPHEN 5-325 MG PO TABS
2.0000 | ORAL_TABLET | Freq: Once | ORAL | Status: AC
  Administered 2019-05-05: 2 via ORAL
  Filled 2019-05-05: qty 2

## 2019-05-05 MED ORDER — OXYCODONE HCL 5 MG PO TABS: 5.0000 mg | ORAL_TABLET | Freq: Four times a day (QID) | ORAL | 0 refills | Status: DC | PRN

## 2019-05-05 MED ORDER — OXYCODONE-ACETAMINOPHEN 5-325 MG PO TABS: 1.0000 | ORAL_TABLET | ORAL | 0 refills | Status: DC | PRN

## 2019-05-05 MED ORDER — DOXYCYCLINE HYCLATE 100 MG PO CAPS: 100.0000 mg | ORAL_CAPSULE | Freq: Two times a day (BID) | ORAL | 0 refills | Status: DC

## 2019-05-05 MED ORDER — DOXYCYCLINE HYCLATE 100 MG PO TABS
100.0000 mg | ORAL_TABLET | Freq: Once | ORAL | Status: AC
  Administered 2019-05-05: 100 mg via ORAL
  Filled 2019-05-05: qty 1

## 2019-05-05 MED ORDER — IOHEXOL 300 MG/ML  SOLN
75.0000 mL | Freq: Once | INTRAMUSCULAR | Status: AC | PRN
  Administered 2019-05-05: 75 mL via INTRAVENOUS

## 2019-05-05 NOTE — ED Notes (Signed)
Pt to xray
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
## 2019-05-05 NOTE — ED Notes (Signed)
Patient verbalizes understanding of discharge instructions. Opportunity for questioning and answers were provided. Armband removed by staff, pt discharged from ED.  
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
## 2019-05-05 NOTE — ED Notes (Signed)
Patient transported to X-ray 

## 2019-05-05 NOTE — Discharge Instructions (Addendum)
Please take ibuprofen and tylenol for pain  Fill the prescription for antibiotics

## 2019-09-23 ENCOUNTER — Encounter (HOSPITAL_COMMUNITY): Payer: Self-pay

## 2019-09-23 ENCOUNTER — Emergency Department (HOSPITAL_COMMUNITY): Payer: Self-pay

## 2019-09-23 ENCOUNTER — Inpatient Hospital Stay (HOSPITAL_COMMUNITY)
Admission: EM | Admit: 2019-09-23 | Discharge: 2019-09-27 | DRG: 897 | Disposition: A | Discharge status: Home / Self Care | Payer: Self-pay | Attending: Internal Medicine | Admitting: Internal Medicine

## 2019-09-23 DIAGNOSIS — I10 Essential (primary) hypertension: Secondary | ICD-10-CM | POA: Diagnosis present

## 2019-09-23 DIAGNOSIS — F10229 Alcohol dependence with intoxication, unspecified: Secondary | ICD-10-CM | POA: Diagnosis present

## 2019-09-23 DIAGNOSIS — Z634 Disappearance and death of family member: Secondary | ICD-10-CM

## 2019-09-23 DIAGNOSIS — R079 Chest pain, unspecified: Secondary | ICD-10-CM | POA: Diagnosis present

## 2019-09-23 DIAGNOSIS — Z8659 Personal history of other mental and behavioral disorders: Secondary | ICD-10-CM

## 2019-09-23 DIAGNOSIS — K2970 Gastritis, unspecified, without bleeding: Secondary | ICD-10-CM | POA: Diagnosis present

## 2019-09-23 DIAGNOSIS — F10230 Alcohol dependence with withdrawal, uncomplicated: Secondary | ICD-10-CM

## 2019-09-23 DIAGNOSIS — R109 Unspecified abdominal pain: Secondary | ICD-10-CM

## 2019-09-23 DIAGNOSIS — Z79899 Other long term (current) drug therapy: Secondary | ICD-10-CM

## 2019-09-23 DIAGNOSIS — Z20828 Contact with and (suspected) exposure to other viral communicable diseases: Secondary | ICD-10-CM | POA: Diagnosis present

## 2019-09-23 DIAGNOSIS — Z720 Tobacco use: Secondary | ICD-10-CM

## 2019-09-23 DIAGNOSIS — F329 Major depressive disorder, single episode, unspecified: Secondary | ICD-10-CM | POA: Diagnosis present

## 2019-09-23 DIAGNOSIS — F10939 Alcohol use, unspecified with withdrawal, unspecified: Secondary | ICD-10-CM | POA: Diagnosis present

## 2019-09-23 DIAGNOSIS — K209 Esophagitis, unspecified without bleeding: Secondary | ICD-10-CM | POA: Diagnosis present

## 2019-09-23 DIAGNOSIS — F10239 Alcohol dependence with withdrawal, unspecified: Principal | ICD-10-CM | POA: Diagnosis present

## 2019-09-23 DIAGNOSIS — Y908 Blood alcohol level of 240 mg/100 ml or more: Secondary | ICD-10-CM | POA: Diagnosis present

## 2019-09-23 DIAGNOSIS — R569 Unspecified convulsions: Secondary | ICD-10-CM

## 2019-09-23 LAB — RAPID URINE DRUG SCREEN, HOSP PERFORMED
Amphetamines: NOT DETECTED
Barbiturates: NOT DETECTED
Benzodiazepines: NOT DETECTED
Cocaine: NOT DETECTED
Opiates: NOT DETECTED
Tetrahydrocannabinol: NOT DETECTED

## 2019-09-23 LAB — CBC
HCT: 48.9 % (ref 39.0–52.0)
Hemoglobin: 17.6 g/dL — ABNORMAL HIGH (ref 13.0–17.0)
MCH: 33.1 pg (ref 26.0–34.0)
MCHC: 36 g/dL (ref 30.0–36.0)
MCV: 91.9 fL (ref 80.0–100.0)
Platelets: 257 10*3/uL (ref 150–400)
RBC: 5.32 MIL/uL (ref 4.22–5.81)
RDW: 11.7 % (ref 11.5–15.5)
WBC: 4.8 10*3/uL (ref 4.0–10.5)
nRBC: 0 % (ref 0.0–0.2)

## 2019-09-23 LAB — COMPREHENSIVE METABOLIC PANEL
ALT: 148 U/L — ABNORMAL HIGH (ref 0–44)
AST: 133 U/L — ABNORMAL HIGH (ref 15–41)
Albumin: 5 g/dL (ref 3.5–5.0)
Alkaline Phosphatase: 91 U/L (ref 38–126)
Anion gap: 14 (ref 5–15)
BUN: 8 mg/dL (ref 6–20)
CO2: 26 mmol/L (ref 22–32)
Calcium: 9.6 mg/dL (ref 8.9–10.3)
Chloride: 100 mmol/L (ref 98–111)
Creatinine, Ser: 1.16 mg/dL (ref 0.61–1.24)
GFR calc Af Amer: >60 mL/min (ref >60)
GFR calc non Af Amer: >60 mL/min (ref >60)
Glucose, Bld: 97 mg/dL (ref 70–99)
Potassium: 3.5 mmol/L (ref 3.5–5.1)
Sodium: 140 mmol/L (ref 135–145)
Total Bilirubin: 1.1 mg/dL (ref 0.3–1.2)
Total Protein: 7.5 g/dL (ref 6.5–8.1)

## 2019-09-23 LAB — TROPONIN I (HIGH SENSITIVITY): Troponin I (High Sensitivity): 3 ng/L (ref <18)

## 2019-09-23 LAB — ETHANOL: Alcohol, Ethyl (B): 269 mg/dL — ABNORMAL HIGH (ref <10)

## 2019-09-23 MED ORDER — LORAZEPAM 1 MG PO TABS: 0.0000 mg | ORAL_TABLET | Freq: Two times a day (BID) | ORAL | Status: DC

## 2019-09-23 MED ORDER — LORAZEPAM 1 MG PO TABS
0.0000 mg | ORAL_TABLET | Freq: Four times a day (QID) | ORAL | Status: DC
  Administered 2019-09-23: 2 mg via ORAL
  Filled 2019-09-23: qty 2

## 2019-09-23 MED ORDER — LIDOCAINE VISCOUS HCL 2 % MT SOLN
15.0000 mL | Freq: Once | OROMUCOSAL | Status: AC
  Administered 2019-09-23: 15 mL via ORAL
  Filled 2019-09-23: qty 15

## 2019-09-23 MED ORDER — LORAZEPAM 2 MG/ML IJ SOLN: 0.0000 mg | Freq: Four times a day (QID) | INTRAMUSCULAR | Status: DC

## 2019-09-23 MED ORDER — LORAZEPAM 2 MG/ML IJ SOLN: 0.0000 mg | Freq: Two times a day (BID) | INTRAMUSCULAR | Status: DC

## 2019-09-23 MED ORDER — ALUM & MAG HYDROXIDE-SIMETH 200-200-20 MG/5ML PO SUSP
30.0000 mL | Freq: Once | ORAL | Status: AC
  Administered 2019-09-23: 30 mL via ORAL
  Filled 2019-09-23: qty 30
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
## 2019-09-23 NOTE — ED Notes (Signed)
Patient transported to CT 

## 2019-09-23 NOTE — ED Notes (Signed)
Pt resting on stretcher with eyes closed, RR even and unlabored, NAD 

## 2019-09-24 ENCOUNTER — Encounter (HOSPITAL_COMMUNITY): Payer: Self-pay | Admitting: Internal Medicine

## 2019-09-24 ENCOUNTER — Observation Stay (HOSPITAL_COMMUNITY): Payer: Self-pay

## 2019-09-24 DIAGNOSIS — F10230 Alcohol dependence with withdrawal, uncomplicated: Secondary | ICD-10-CM

## 2019-09-24 DIAGNOSIS — I10 Essential (primary) hypertension: Secondary | ICD-10-CM

## 2019-09-24 DIAGNOSIS — F10239 Alcohol dependence with withdrawal, unspecified: Secondary | ICD-10-CM | POA: Diagnosis present

## 2019-09-24 DIAGNOSIS — R079 Chest pain, unspecified: Secondary | ICD-10-CM | POA: Diagnosis present

## 2019-09-24 DIAGNOSIS — F10939 Alcohol use, unspecified with withdrawal, unspecified: Secondary | ICD-10-CM | POA: Diagnosis present

## 2019-09-24 DIAGNOSIS — R569 Unspecified convulsions: Secondary | ICD-10-CM | POA: Insufficient documentation

## 2019-09-24 LAB — BASIC METABOLIC PANEL
Anion gap: 14 (ref 5–15)
BUN: 8 mg/dL (ref 6–20)
CO2: 25 mmol/L (ref 22–32)
Calcium: 8.5 mg/dL — ABNORMAL LOW (ref 8.9–10.3)
Chloride: 100 mmol/L (ref 98–111)
Creatinine, Ser: 0.97 mg/dL (ref 0.61–1.24)
GFR calc Af Amer: >60 mL/min (ref >60)
GFR calc non Af Amer: >60 mL/min (ref >60)
Glucose, Bld: 96 mg/dL (ref 70–99)
Potassium: 3.3 mmol/L — ABNORMAL LOW (ref 3.5–5.1)
Sodium: 139 mmol/L (ref 135–145)

## 2019-09-24 LAB — COMPREHENSIVE METABOLIC PANEL
ALT: 135 U/L — ABNORMAL HIGH (ref 0–44)
AST: 125 U/L — ABNORMAL HIGH (ref 15–41)
Albumin: 4.7 g/dL (ref 3.5–5.0)
Alkaline Phosphatase: 82 U/L (ref 38–126)
Anion gap: 13 (ref 5–15)
BUN: 9 mg/dL (ref 6–20)
CO2: 25 mmol/L (ref 22–32)
Calcium: 9.3 mg/dL (ref 8.9–10.3)
Chloride: 102 mmol/L (ref 98–111)
Creatinine, Ser: 1.04 mg/dL (ref 0.61–1.24)
GFR calc Af Amer: >60 mL/min (ref >60)
GFR calc non Af Amer: >60 mL/min (ref >60)
Glucose, Bld: 87 mg/dL (ref 70–99)
Potassium: 4.4 mmol/L (ref 3.5–5.1)
Sodium: 140 mmol/L (ref 135–145)
Total Bilirubin: 0.8 mg/dL (ref 0.3–1.2)
Total Protein: 6.9 g/dL (ref 6.5–8.1)

## 2019-09-24 LAB — CBC
HCT: 46 % (ref 39.0–52.0)
HCT: 46.3 % (ref 39.0–52.0)
Hemoglobin: 16.7 g/dL (ref 13.0–17.0)
Hemoglobin: 16.5 g/dL (ref 13.0–17.0)
MCH: 33.6 pg (ref 26.0–34.0)
MCH: 32.9 pg (ref 26.0–34.0)
MCHC: 36.3 g/dL — ABNORMAL HIGH (ref 30.0–36.0)
MCHC: 35.6 g/dL (ref 30.0–36.0)
MCV: 92.6 fL (ref 80.0–100.0)
MCV: 92.2 fL (ref 80.0–100.0)
Platelets: 236 10*3/uL (ref 150–400)
Platelets: 225 10*3/uL (ref 150–400)
RBC: 4.97 MIL/uL (ref 4.22–5.81)
RBC: 5.02 MIL/uL (ref 4.22–5.81)
RDW: 11.8 % (ref 11.5–15.5)
RDW: 11.8 % (ref 11.5–15.5)
WBC: 3.7 10*3/uL — ABNORMAL LOW (ref 4.0–10.5)
WBC: 2.8 10*3/uL — ABNORMAL LOW (ref 4.0–10.5)
nRBC: 0 % (ref 0.0–0.2)
nRBC: 0 % (ref 0.0–0.2)

## 2019-09-24 LAB — MAGNESIUM: Magnesium: 2.1 mg/dL (ref 1.7–2.4)

## 2019-09-24 LAB — LIPASE, BLOOD: Lipase: 26 U/L (ref 11–51)

## 2019-09-24 LAB — TROPONIN I (HIGH SENSITIVITY): Troponin I (High Sensitivity): 3 ng/L (ref <18)

## 2019-09-24 LAB — SARS CORONAVIRUS 2 (TAT 6-24 HRS): SARS Coronavirus 2: NEGATIVE

## 2019-09-24 LAB — PHOSPHORUS: Phosphorus: 2.9 mg/dL (ref 2.5–4.6)

## 2019-09-24 MED ORDER — LISINOPRIL 20 MG PO TABS
20.0000 mg | ORAL_TABLET | Freq: Every day | ORAL | Status: DC
  Administered 2019-09-24 – 2019-09-27 (×4): 20 mg via ORAL
  Filled 2019-09-24 (×4): qty 1

## 2019-09-24 MED ORDER — FOLIC ACID 1 MG PO TABS
1.0000 mg | ORAL_TABLET | Freq: Every day | ORAL | Status: DC
  Administered 2019-09-24 – 2019-09-27 (×4): 1 mg via ORAL
  Filled 2019-09-24 (×4): qty 1

## 2019-09-24 MED ORDER — POTASSIUM CHLORIDE CRYS ER 20 MEQ PO TBCR
40.0000 meq | EXTENDED_RELEASE_TABLET | ORAL | Status: AC
  Administered 2019-09-24 (×2): 40 meq via ORAL
  Filled 2019-09-24 (×2): qty 2

## 2019-09-24 MED ORDER — ALUM & MAG HYDROXIDE-SIMETH 200-200-20 MG/5ML PO SUSP
30.0000 mL | Freq: Once | ORAL | Status: AC
  Administered 2019-09-24: 30 mL via ORAL
  Filled 2019-09-24: qty 30

## 2019-09-24 MED ORDER — ACETAMINOPHEN 325 MG PO TABS
650.0000 mg | ORAL_TABLET | Freq: Four times a day (QID) | ORAL | Status: DC | PRN
  Administered 2019-09-24 (×2): 650 mg via ORAL
  Filled 2019-09-24 (×2): qty 2

## 2019-09-24 MED ORDER — ONDANSETRON HCL 4 MG/2ML IJ SOLN: 4.0000 mg | Freq: Four times a day (QID) | INTRAMUSCULAR | Status: DC | PRN

## 2019-09-24 MED ORDER — THIAMINE HCL 100 MG/ML IJ SOLN: 100.0000 mg | Freq: Every day | INTRAMUSCULAR | Status: DC

## 2019-09-24 MED ORDER — ONDANSETRON HCL 4 MG PO TABS
4.0000 mg | ORAL_TABLET | Freq: Four times a day (QID) | ORAL | Status: DC | PRN
  Administered 2019-09-24: 4 mg via ORAL
  Filled 2019-09-24: qty 1

## 2019-09-24 MED ORDER — VITAMIN B-1 100 MG PO TABS
100.0000 mg | ORAL_TABLET | Freq: Every day | ORAL | Status: DC
  Administered 2019-09-24 – 2019-09-27 (×4): 100 mg via ORAL
  Filled 2019-09-24 (×4): qty 1

## 2019-09-24 MED ORDER — MENTHOL 3 MG MT LOZG
1.0000 | LOZENGE | OROMUCOSAL | Status: DC | PRN
  Administered 2019-09-24 – 2019-09-25 (×2): 3 mg via ORAL
  Filled 2019-09-24 (×2): qty 9

## 2019-09-24 MED ORDER — ACETAMINOPHEN 650 MG RE SUPP: 650.0000 mg | Freq: Four times a day (QID) | RECTAL | Status: DC | PRN

## 2019-09-24 MED ORDER — IOHEXOL 350 MG/ML SOLN
100.0000 mL | Freq: Once | INTRAVENOUS | Status: AC | PRN
  Administered 2019-09-24: 67 mL via INTRAVENOUS

## 2019-09-24 MED ORDER — LORAZEPAM 2 MG/ML IJ SOLN: 1.0000 mg | INTRAMUSCULAR | Status: AC | PRN

## 2019-09-24 MED ORDER — LORAZEPAM 1 MG PO TABS
1.0000 mg | ORAL_TABLET | ORAL | Status: AC | PRN
  Administered 2019-09-24 (×2): 1 mg via ORAL
  Administered 2019-09-24 (×2): 2 mg via ORAL
  Administered 2019-09-24 (×2): 1 mg via ORAL
  Administered 2019-09-25 (×2): 2 mg via ORAL
  Administered 2019-09-25: 1 mg via ORAL
  Administered 2019-09-25: 2 mg via ORAL
  Administered 2019-09-26 (×3): 1 mg via ORAL
  Administered 2019-09-26: 2 mg via ORAL
  Filled 2019-09-24: qty 1
  Filled 2019-09-24: qty 2
  Filled 2019-09-24 (×2): qty 1
  Filled 2019-09-24 (×2): qty 2
  Filled 2019-09-24 (×2): qty 1
  Filled 2019-09-24 (×2): qty 2
  Filled 2019-09-24 (×2): qty 1
  Filled 2019-09-24: qty 2
  Filled 2019-09-24: qty 1

## 2019-09-24 MED ORDER — LIDOCAINE VISCOUS HCL 2 % MT SOLN
15.0000 mL | Freq: Once | OROMUCOSAL | Status: AC
  Administered 2019-09-24: 15 mL via ORAL
  Filled 2019-09-24: qty 15

## 2019-09-24 MED ORDER — HEPARIN SODIUM (PORCINE) 5000 UNIT/ML IJ SOLN
5000.0000 [IU] | Freq: Three times a day (TID) | INTRAMUSCULAR | Status: DC
  Administered 2019-09-24 – 2019-09-26 (×7): 5000 [IU] via SUBCUTANEOUS
  Filled 2019-09-24 (×9): qty 1

## 2019-09-24 MED ORDER — ADULT MULTIVITAMIN W/MINERALS CH
1.0000 | ORAL_TABLET | Freq: Every day | ORAL | Status: DC
  Administered 2019-09-24 – 2019-09-26 (×3): 1 via ORAL
  Filled 2019-09-24 (×4): qty 1

## 2019-09-24 NOTE — Procedures (Addendum)
Patient Name: Adam [name] [facility].  MRN: [id number]  Epilepsy Attending: Lora Havens  Referring Physician/Provider: Dr. Darliss Cheney Date: 09/24/2019 Duration: 23.16 minutes  Patient history: 33 year old male with history of alcohol use presented with seizure-like episode.  EEG to evaluate for seizure.  Level of alertness: Awake and asleep  AEDs during EEG study: Lorazepam  Technical aspects: This EEG study was done with scalp electrodes positioned according to the 10-20 International system of electrode placement. Electrical activity was acquired at a sampling rate of 500Hz  and reviewed with a high frequency filter of 70Hz  and a low frequency filter of 1Hz . EEG data were recorded continuously and digitally stored.   Description: During awake state, the posterior dominant rhythm consists of 10 Hz activity of moderate voltage (25-35 uV) seen predominantly in posterior head regions, symmetric and reactive to eye opening and eye closing.   Sleep was characterized by vertex waves, sleep spindles (12 to 14 Hz ), maximal bifrontal central.  There is also intermittent generalized 3 to 5 Hz theta-delta slowing admixed with an excessive amount of 15 to 18 Hz, 2-3 uV beta activity with irregular morphology distributed symmetrically and diffusely.  Physiologic photic driving was seen during photic stimulation.  Diffuse generalized 3 to 5 Hz theta-delta slowing was seen during hyperventilation.  Abnormality -Intermittent slow, generalized -Excessive beta, generalized   IMPRESSION: This study is suggestive of mild diffuse encephalopathy, nonspecific to etiology. No seizures or epileptiform discharges were seen throughout the recording.  The excessive beta activity seen in the background is most likely due to the effect of benzodiazepine and is a benign EEG pattern.  [place] Barbra Sarks

## 2019-09-24 NOTE — ED Notes (Signed)
Patient transported to CT 

## 2019-09-24 NOTE — ED Notes (Signed)
Pt to Ultrasound

## 2019-09-24 NOTE — Progress Notes (Signed)
NEW ADMISSION NOTE New Admission Note:   Arrival Method: stretcher from ED Mental Orientation: alert and oriented x4  Telemetry: yes  Assessment: Completed Skin: intact and dry  IV: right forearm Pain: 0 Safety Measures: Safety Fall Prevention Plan has been given, discussed and signed Admission: Completed [facility] Orientation: Patient has been orientated to the room, unit and staff.  Family: NA  Orders have been reviewed and implemented. Will continue to monitor the patient. Call light has been placed within reach and bed alarm has been activated.   Baldo Ash, RN

## 2019-09-24 NOTE — ED Notes (Signed)
Tele

## 2019-09-24 NOTE — ED Notes (Signed)
Pt back from US then to CT

## 2019-09-24 NOTE — ED Notes (Signed)
Meal and Beverage offered.

## 2019-09-24 NOTE — Progress Notes (Signed)
EEG Completed; Results Pending  

## 2019-09-24 NOTE — ED Notes (Signed)
EEG at bedside.

## 2019-09-24 NOTE — Progress Notes (Signed)
Patient seen and examined the ER.  This is a young 33 year old gentleman who was admitted early morning by hospitalist service due to witnessed seizure which was thought to be secondary to alcohol withdrawal.  Patient currently feels some nausea and has some tremors but no other complaint.  He has not had any other seizure activity since coming to the emergency department.  EEG is negative for any seizure.  Since he has a started to have mild symptoms of alcohol withdrawal, we are going to keep him in the hospital to treat for this. On examination he was alert and oriented.  Abdomen soft and nontender.  Lungs clear to auscultation.

## 2019-09-24 NOTE — Plan of Care (Signed)
  Problem: Education: Goal: Knowledge of General Education information will improve Description Including pain rating scale, medication(s)/side effects and non-pharmacologic comfort measures Outcome: Progressing   

## 2019-09-24 NOTE — ED Notes (Signed)
Ordered diet tray for pt  
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
## 2019-09-24 NOTE — ED Notes (Signed)
Breakfast Ordered 

## 2019-09-25 LAB — COMPREHENSIVE METABOLIC PANEL
ALT: 99 U/L — ABNORMAL HIGH (ref 0–44)
AST: 76 U/L — ABNORMAL HIGH (ref 15–41)
Albumin: 3.8 g/dL (ref 3.5–5.0)
Alkaline Phosphatase: 70 U/L (ref 38–126)
Anion gap: 9 (ref 5–15)
BUN: 9 mg/dL (ref 6–20)
CO2: 25 mmol/L (ref 22–32)
Calcium: 9 mg/dL (ref 8.9–10.3)
Chloride: 102 mmol/L (ref 98–111)
Creatinine, Ser: 0.94 mg/dL (ref 0.61–1.24)
GFR calc Af Amer: >60 mL/min (ref >60)
GFR calc non Af Amer: >60 mL/min (ref >60)
Glucose, Bld: 97 mg/dL (ref 70–99)
Potassium: 3.7 mmol/L (ref 3.5–5.1)
Sodium: 136 mmol/L (ref 135–145)
Total Bilirubin: 1.9 mg/dL — ABNORMAL HIGH (ref 0.3–1.2)
Total Protein: 6.1 g/dL — ABNORMAL LOW (ref 6.5–8.1)

## 2019-09-25 LAB — CBC WITH DIFFERENTIAL/PLATELET
Abs Immature Granulocytes: 0 10*3/uL (ref 0.00–0.07)
Basophils Absolute: 0 10*3/uL (ref 0.0–0.1)
Basophils Relative: 1 %
Eosinophils Absolute: 0 10*3/uL (ref 0.0–0.5)
Eosinophils Relative: 0 %
HCT: 45.3 % (ref 39.0–52.0)
Hemoglobin: 16 g/dL (ref 13.0–17.0)
Immature Granulocytes: 0 %
Lymphocytes Relative: 45 %
Lymphs Abs: 1.4 10*3/uL (ref 0.7–4.0)
MCH: 32.9 pg (ref 26.0–34.0)
MCHC: 35.3 g/dL (ref 30.0–36.0)
MCV: 93.2 fL (ref 80.0–100.0)
Monocytes Absolute: 0.3 10*3/uL (ref 0.1–1.0)
Monocytes Relative: 8 %
Neutro Abs: 1.4 10*3/uL — ABNORMAL LOW (ref 1.7–7.7)
Neutrophils Relative %: 46 %
Platelets: 216 10*3/uL (ref 150–400)
RBC: 4.86 MIL/uL (ref 4.22–5.81)
RDW: 11.6 % (ref 11.5–15.5)
WBC: 3.1 10*3/uL — ABNORMAL LOW (ref 4.0–10.5)
nRBC: 0 % (ref 0.0–0.2)

## 2019-09-25 LAB — MAGNESIUM: Magnesium: 2.4 mg/dL (ref 1.7–2.4)

## 2019-09-25 NOTE — Plan of Care (Signed)
  Problem: Education: Goal: Knowledge of General Education information will improve Description: Including pain rating scale, medication(s)/side effects and non-pharmacologic comfort measures Outcome: Progressing   Problem: Activity: Goal: Risk for activity intolerance will decrease Outcome: Progressing   
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
## 2019-09-25 NOTE — TOC Initial Note (Signed)
Transition of Care (TOC) - Initial/Assessment Note    Patient Details  Name: Adam [name] [employer]. MRN: [id number] Date of Birth: 1986-04-30  Transition of Care [facility]) CM/SW Contact:    Bartholomew Crews, RN Phone Number: [phone] 09/25/2019, 3:48 PM  Clinical Narrative:                 Spoke with patient at the bedside. No HH or DME needs. Reports being independent. States that he is employed, however, his job is in transition d/t current pandemic. Verified no PCP. Patient agreeable to clinic appointment - able to schedule appointment with [place] for 11/30 9:30am. TOC following for transition needs.   Expected Discharge Plan: Home/Self Care Barriers to Discharge: Continued Medical Work up   Patient Goals and CMS Choice        Expected Discharge Plan and Services Expected Discharge Plan: Home/Self Care In-house Referral: Development worker, community, Clinical Social Work Discharge Planning Services: CM Consult, [facility] Post Acute Care Choice: NA Living arrangements for the past 2 months: Single Family Home                 DME Arranged: N/A DME Agency: NA       HH Arranged: NA HH Agency: NA        Prior Living Arrangements/Services Living arrangements for the past 2 months: Single Family Home Lives with:: Self Patient language and need for interpreter reviewed:: Yes              Criminal Activity/Legal Involvement Pertinent to Current Situation/Hospitalization: No - Comment as needed  Activities of Daily Living      Permission Sought/Granted                  Emotional Assessment Appearance:: Appears stated age Attitude/Demeanor/Rapport: Engaged Affect (typically observed): Accepting Orientation: : Oriented to Self, Oriented to Place, Oriented to  Time, Oriented to Situation Alcohol / Substance Use: Alcohol Use    Admission diagnosis:  Abdominal pain [R10.9] Alcohol withdrawal seizure without complication ([place])  [J28.786, R56.9] Hypertension, unspecified type [I10] Alcohol withdrawal ([place]) [F10.239] Patient Active Problem List   Diagnosis Date Noted  . Alcohol withdrawal ([place]) 09/24/2019  . Chest pain 09/24/2019  . Alcohol withdrawal seizure without complication ([place])   . Pneumothorax 04/28/2019  . Essential hypertension 06/25/2010   PCP:  Patient, No Pcp Per Pharmacy:   [facility] [address] [place] - [address] [place] [address] Phone: [phone] Fax: [place], [place] - [address] [facility] [address] [place] [address] Phone: [phone] Fax: [place], [place] [address] Phone: [phone] Fax: [phone]     Social Determinants of Health (SDOH) Interventions    Readmission Risk Interventions No flowsheet data found.

## 2019-09-26 LAB — BASIC METABOLIC PANEL
Anion gap: 7 (ref 5–15)
BUN: 11 mg/dL (ref 6–20)
CO2: 30 mmol/L (ref 22–32)
Calcium: 9.2 mg/dL (ref 8.9–10.3)
Chloride: 101 mmol/L (ref 98–111)
Creatinine, Ser: 1.13 mg/dL (ref 0.61–1.24)
GFR calc Af Amer: >60 mL/min (ref >60)
GFR calc non Af Amer: >60 mL/min (ref >60)
Glucose, Bld: 84 mg/dL (ref 70–99)
Potassium: 4.3 mmol/L (ref 3.5–5.1)
Sodium: 138 mmol/L (ref 135–145)

## 2019-09-26 LAB — HEPATIC FUNCTION PANEL
ALT: 97 U/L — ABNORMAL HIGH (ref 0–44)
AST: 90 U/L — ABNORMAL HIGH (ref 15–41)
Albumin: 3.7 g/dL (ref 3.5–5.0)
Alkaline Phosphatase: 65 U/L (ref 38–126)
Bilirubin, Direct: 0.2 mg/dL (ref 0.0–0.2)
Indirect Bilirubin: 1.1 mg/dL — ABNORMAL HIGH (ref 0.3–0.9)
Total Bilirubin: 1.3 mg/dL — ABNORMAL HIGH (ref 0.3–1.2)
Total Protein: 6 g/dL — ABNORMAL LOW (ref 6.5–8.1)

## 2019-09-26 LAB — CBC WITH DIFFERENTIAL/PLATELET
Abs Immature Granulocytes: 0.01 10*3/uL (ref 0.00–0.07)
Basophils Absolute: 0.1 10*3/uL (ref 0.0–0.1)
Basophils Relative: 1 %
Eosinophils Absolute: 0 10*3/uL (ref 0.0–0.5)
Eosinophils Relative: 0 %
HCT: 45.9 % (ref 39.0–52.0)
Hemoglobin: 15.9 g/dL (ref 13.0–17.0)
Immature Granulocytes: 0 %
Lymphocytes Relative: 36 %
Lymphs Abs: 1.5 10*3/uL (ref 0.7–4.0)
MCH: 33 pg (ref 26.0–34.0)
MCHC: 34.6 g/dL (ref 30.0–36.0)
MCV: 95.2 fL (ref 80.0–100.0)
Monocytes Absolute: 0.4 10*3/uL (ref 0.1–1.0)
Monocytes Relative: 9 %
Neutro Abs: 2.3 10*3/uL (ref 1.7–7.7)
Neutrophils Relative %: 54 %
Platelets: 207 10*3/uL (ref 150–400)
RBC: 4.82 MIL/uL (ref 4.22–5.81)
RDW: 11.6 % (ref 11.5–15.5)
WBC: 4.3 10*3/uL (ref 4.0–10.5)
nRBC: 0 % (ref 0.0–0.2)
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
## 2019-09-27 DIAGNOSIS — R079 Chest pain, unspecified: Secondary | ICD-10-CM

## 2019-09-27 MED ORDER — ADULT MULTIVITAMIN W/MINERALS CH: 1.0000 | ORAL_TABLET | Freq: Every day | ORAL | Status: DC

## 2019-09-27 MED ORDER — FOLIC ACID 1 MG PO TABS: 1.0000 mg | ORAL_TABLET | Freq: Every day | ORAL | 0 refills | Status: DC

## 2019-09-27 MED ORDER — THIAMINE HCL 100 MG PO TABS: 100.0000 mg | ORAL_TABLET | Freq: Every day | ORAL | 0 refills | Status: DC

## 2019-09-27 NOTE — Plan of Care (Signed)
  Problem: Education: Goal: Knowledge of General Education information will improve Description: Including pain rating scale, medication(s)/side effects and non-pharmacologic comfort measures Outcome: Progressing   Problem: Coping: Goal: Level of anxiety will decrease Outcome: Progressing   

## 2019-09-27 NOTE — Discharge Instructions (Signed)

## 2019-09-27 NOTE — Progress Notes (Signed)
Pt found IV out upon awaking. RN went in and inspected IV sight. Sight clean, dry and intact. Pt refusing another IV line placement. Pt educated. On provider made aware. Will continue to monitor.

## 2019-09-27 NOTE — TOC Progression Note (Signed)
Transition of Care (TOC) - Progression Note    Patient Details  Name: Adam [name] [employer]. MRN: [id number] Date of Birth: Mar 25, 1986  Transition of Care [facility]) CM/SW Contact  Sharlet Salina Mila Homer, LCSW Phone Number: 09/27/2019, 11:32 AM  Clinical Narrative:  CSW talked with patient at the bedside regarding his alcohol use. [name] reported that he drinks 2 5th's of liquor a day. He was agreeable to receiving alcohol resource information.    Expected Discharge Plan: Home/Self Care Barriers to Discharge: Inadequate or no insurance, Continued Medical Work up  Expected Discharge Plan and Services Expected Discharge Plan: Home/Self Care In-house Referral: Development worker, community, Clinical Social Work Discharge Planning Services: CM Consult, [place] Acute Care Choice: NA Living arrangements for the past 2 months: Single Family Home Expected Discharge Date: 09/27/19               DME Arranged: N/A DME Agency: NA       HH Arranged: NA HH Agency: NA         Social Determinants of Health (SDOH) Interventions  Patient abuses alcohol and was agreeable to receiving alcohol treatment resources.  Readmission Risk Interventions No flowsheet data found.

## 2019-09-27 NOTE — Progress Notes (Addendum)
DISCHARGE NOTE  Adam [name]. to be discharged Home per MD order. Patient verbalized understanding.  Skin clean, dry and intact without evidence of skin break down, no evidence of skin tears noted. IV catheter discontinued intact. Site without signs and symptoms of complications. Dressing and pressure applied. Pt denies pain at the site currently. No complaints noted.  Patient free of lines, drains, and wounds.   Discharge packet assembled. Seen by Education officer, museum at bedside.  An After Visit Summary (AVS) was printed and given to the patient at bedside. Patient escorted via wheelchair and discharged to home via private transportation.  All questions and concerns addressed.   Dolores Hoose, RN

## 2019-09-27 NOTE — Progress Notes (Signed)
Patient refused to escort in w/c and walked out of the unit.
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
## 2019-09-27 NOTE — Plan of Care (Signed)
  Problem: Health Behavior/Discharge Planning: Goal: Ability to manage health-related needs will improve Outcome: Adequate for Discharge   Problem: Activity: Goal: Risk for activity intolerance will decrease Outcome: Adequate for Discharge   Problem: Nutrition: Goal: Adequate nutrition will be maintained Outcome: Adequate for Discharge   Problem: Coping: Goal: Level of anxiety will decrease 09/27/2019 1109 by Dolores Hoose, RN Outcome: Adequate for Discharge 09/27/2019 0122 by Dolores Hoose, RN Outcome: Progressing

## 2019-10-22 ENCOUNTER — Inpatient Hospital Stay (INDEPENDENT_AMBULATORY_CARE_PROVIDER_SITE_OTHER): Payer: Self-pay | Admitting: Primary Care

## 2019-11-06 ENCOUNTER — Encounter (HOSPITAL_COMMUNITY): Payer: Self-pay | Admitting: Emergency Medicine

## 2019-11-06 ENCOUNTER — Other Ambulatory Visit: Payer: Self-pay

## 2019-11-06 ENCOUNTER — Emergency Department (HOSPITAL_COMMUNITY)
Admission: EM | Admit: 2019-11-06 | Discharge: 2019-11-06 | Discharge status: Against Medical Advice | Payer: Self-pay | Attending: Emergency Medicine | Admitting: Emergency Medicine

## 2019-11-06 DIAGNOSIS — Z5321 Procedure and treatment not carried out due to patient leaving prior to being seen by health care provider: Secondary | ICD-10-CM | POA: Insufficient documentation

## 2019-11-06 LAB — COMPREHENSIVE METABOLIC PANEL
ALT: 28 U/L (ref 0–44)
AST: 40 U/L (ref 15–41)
Albumin: 4.4 g/dL (ref 3.5–5.0)
Alkaline Phosphatase: 55 U/L (ref 38–126)
Anion gap: 12 (ref 5–15)
BUN: 12 mg/dL (ref 6–20)
CO2: 26 mmol/L (ref 22–32)
Calcium: 8.9 mg/dL (ref 8.9–10.3)
Chloride: 98 mmol/L (ref 98–111)
Creatinine, Ser: 1.01 mg/dL (ref 0.61–1.24)
GFR calc Af Amer: >60 mL/min (ref >60)
GFR calc non Af Amer: >60 mL/min (ref >60)
Glucose, Bld: 102 mg/dL — ABNORMAL HIGH (ref 70–99)
Potassium: 4 mmol/L (ref 3.5–5.1)
Sodium: 136 mmol/L (ref 135–145)
Total Bilirubin: 1.2 mg/dL (ref 0.3–1.2)
Total Protein: 7.4 g/dL (ref 6.5–8.1)

## 2019-11-06 LAB — CBC
HCT: 49.6 % (ref 39.0–52.0)
Hemoglobin: 17.7 g/dL — ABNORMAL HIGH (ref 13.0–17.0)
MCH: 32.8 pg (ref 26.0–34.0)
MCHC: 35.7 g/dL (ref 30.0–36.0)
MCV: 92 fL (ref 80.0–100.0)
Platelets: 214 10*3/uL (ref 150–400)
RBC: 5.39 MIL/uL (ref 4.22–5.81)
RDW: 11.2 % — ABNORMAL LOW (ref 11.5–15.5)
WBC: 3.3 10*3/uL — ABNORMAL LOW (ref 4.0–10.5)
nRBC: 0 % (ref 0.0–0.2)

## 2019-11-06 LAB — ETHANOL: Alcohol, Ethyl (B): 122 mg/dL — ABNORMAL HIGH (ref <10)

## 2019-11-06 MED ORDER — LORAZEPAM 0.5 MG PO TABS
1.0000 mg | ORAL_TABLET | Freq: Once | ORAL | Status: AC
  Administered 2019-11-06: 1 mg via ORAL
  Filled 2019-11-06: qty 2

## 2019-11-06 MED ORDER — ONDANSETRON 4 MG PO TBDP
4.0000 mg | ORAL_TABLET | Freq: Once | ORAL | Status: AC
  Administered 2019-11-06: 4 mg via ORAL
  Filled 2019-11-06: qty 1

## 2019-11-06 NOTE — ED Triage Notes (Signed)
Pt reports has alcohol problem. Drinks 1/5th of liquor daily. Last drink yesterday at 2pm. Had a seizure this morning. Reports began having CP after seizure today. Denies injury. Incontinence. Awake, alert, appropriate at present. C/o nausea and vomiting frequently.

## 2019-11-06 NOTE — ED Notes (Signed)
Pt name called for updated vitals, no response 

## 2019-11-06 NOTE — ED Notes (Signed)
Pt name called 3x for vitals, still no response. Pt moved off the floor.

## 2019-11-10 ENCOUNTER — Inpatient Hospital Stay (HOSPITAL_COMMUNITY)
Admission: EM | Admit: 2019-11-10 | Discharge: 2019-11-13 | DRG: 897 | Disposition: A | Discharge status: Home / Self Care | Payer: Self-pay | Attending: Internal Medicine | Admitting: Internal Medicine

## 2019-11-10 ENCOUNTER — Inpatient Hospital Stay (HOSPITAL_COMMUNITY): Payer: Self-pay

## 2019-11-10 ENCOUNTER — Emergency Department (HOSPITAL_COMMUNITY): Payer: Self-pay

## 2019-11-10 ENCOUNTER — Encounter (HOSPITAL_COMMUNITY): Payer: Self-pay | Admitting: Emergency Medicine

## 2019-11-10 ENCOUNTER — Other Ambulatory Visit: Payer: Self-pay

## 2019-11-10 DIAGNOSIS — Z20828 Contact with and (suspected) exposure to other viral communicable diseases: Secondary | ICD-10-CM | POA: Diagnosis present

## 2019-11-10 DIAGNOSIS — E876 Hypokalemia: Secondary | ICD-10-CM

## 2019-11-10 DIAGNOSIS — F4321 Adjustment disorder with depressed mood: Secondary | ICD-10-CM

## 2019-11-10 DIAGNOSIS — Z8249 Family history of ischemic heart disease and other diseases of the circulatory system: Secondary | ICD-10-CM

## 2019-11-10 DIAGNOSIS — Z23 Encounter for immunization: Secondary | ICD-10-CM

## 2019-11-10 DIAGNOSIS — R0789 Other chest pain: Secondary | ICD-10-CM

## 2019-11-10 DIAGNOSIS — F10230 Alcohol dependence with withdrawal, uncomplicated: Secondary | ICD-10-CM

## 2019-11-10 DIAGNOSIS — R569 Unspecified convulsions: Secondary | ICD-10-CM

## 2019-11-10 DIAGNOSIS — I10 Essential (primary) hypertension: Secondary | ICD-10-CM | POA: Diagnosis present

## 2019-11-10 DIAGNOSIS — F17210 Nicotine dependence, cigarettes, uncomplicated: Secondary | ICD-10-CM | POA: Diagnosis present

## 2019-11-10 DIAGNOSIS — F10239 Alcohol dependence with withdrawal, unspecified: Principal | ICD-10-CM | POA: Diagnosis present

## 2019-11-10 DIAGNOSIS — R079 Chest pain, unspecified: Secondary | ICD-10-CM | POA: Diagnosis present

## 2019-11-10 DIAGNOSIS — F13939 Sedative, hypnotic or anxiolytic use, unspecified with withdrawal, unspecified: Secondary | ICD-10-CM

## 2019-11-10 DIAGNOSIS — Z801 Family history of malignant neoplasm of trachea, bronchus and lung: Secondary | ICD-10-CM

## 2019-11-10 DIAGNOSIS — F10939 Alcohol use, unspecified with withdrawal, unspecified: Secondary | ICD-10-CM

## 2019-11-10 DIAGNOSIS — F13239 Sedative, hypnotic or anxiolytic dependence with withdrawal, unspecified: Secondary | ICD-10-CM | POA: Diagnosis present

## 2019-11-10 LAB — COMPREHENSIVE METABOLIC PANEL
ALT: 35 U/L (ref 0–44)
AST: 54 U/L — ABNORMAL HIGH (ref 15–41)
Albumin: 3.9 g/dL (ref 3.5–5.0)
Alkaline Phosphatase: 45 U/L (ref 38–126)
Anion gap: 11 (ref 5–15)
BUN: 9 mg/dL (ref 6–20)
CO2: 25 mmol/L (ref 22–32)
Calcium: 8.6 mg/dL — ABNORMAL LOW (ref 8.9–10.3)
Chloride: 98 mmol/L (ref 98–111)
Creatinine, Ser: 1.03 mg/dL (ref 0.61–1.24)
GFR calc Af Amer: >60 mL/min (ref >60)
GFR calc non Af Amer: >60 mL/min (ref >60)
Glucose, Bld: 109 mg/dL — ABNORMAL HIGH (ref 70–99)
Potassium: 3.2 mmol/L — ABNORMAL LOW (ref 3.5–5.1)
Sodium: 134 mmol/L — ABNORMAL LOW (ref 135–145)
Total Bilirubin: 1.2 mg/dL (ref 0.3–1.2)
Total Protein: 6.3 g/dL — ABNORMAL LOW (ref 6.5–8.1)

## 2019-11-10 LAB — MRSA PCR SCREENING: MRSA by PCR: NEGATIVE

## 2019-11-10 LAB — RAPID URINE DRUG SCREEN, HOSP PERFORMED
Amphetamines: NOT DETECTED
Barbiturates: NOT DETECTED
Benzodiazepines: NOT DETECTED
Cocaine: NOT DETECTED
Opiates: NOT DETECTED
Tetrahydrocannabinol: NOT DETECTED

## 2019-11-10 LAB — BASIC METABOLIC PANEL
Anion gap: 12 (ref 5–15)
BUN: 9 mg/dL (ref 6–20)
CO2: 25 mmol/L (ref 22–32)
Calcium: 9.1 mg/dL (ref 8.9–10.3)
Chloride: 95 mmol/L — ABNORMAL LOW (ref 98–111)
Creatinine, Ser: 1.08 mg/dL (ref 0.61–1.24)
GFR calc Af Amer: >60 mL/min (ref >60)
GFR calc non Af Amer: >60 mL/min (ref >60)
Glucose, Bld: 138 mg/dL — ABNORMAL HIGH (ref 70–99)
Potassium: 3.2 mmol/L — ABNORMAL LOW (ref 3.5–5.1)
Sodium: 132 mmol/L — ABNORMAL LOW (ref 135–145)

## 2019-11-10 LAB — ETHANOL: Alcohol, Ethyl (B): <10 mg/dL (ref <10)

## 2019-11-10 LAB — RESPIRATORY PANEL BY RT PCR (FLU A&B, COVID)
Influenza A by PCR: NEGATIVE
Influenza B by PCR: NEGATIVE
SARS Coronavirus 2 by RT PCR: NEGATIVE

## 2019-11-10 LAB — CBC
HCT: 45 % (ref 39.0–52.0)
Hemoglobin: 15.9 g/dL (ref 13.0–17.0)
MCH: 33.1 pg (ref 26.0–34.0)
MCHC: 35.3 g/dL (ref 30.0–36.0)
MCV: 93.6 fL (ref 80.0–100.0)
Platelets: 242 10*3/uL (ref 150–400)
RBC: 4.81 MIL/uL (ref 4.22–5.81)
RDW: 11.4 % — ABNORMAL LOW (ref 11.5–15.5)
WBC: 9.8 10*3/uL (ref 4.0–10.5)
nRBC: 0 % (ref 0.0–0.2)

## 2019-11-10 LAB — MAGNESIUM: Magnesium: 1.8 mg/dL (ref 1.7–2.4)

## 2019-11-10 LAB — PHOSPHORUS: Phosphorus: 2.4 mg/dL — ABNORMAL LOW (ref 2.5–4.6)

## 2019-11-10 LAB — TROPONIN I (HIGH SENSITIVITY)
Troponin I (High Sensitivity): 8 ng/L (ref <18)
Troponin I (High Sensitivity): 4 ng/L (ref <18)

## 2019-11-10 MED ORDER — TRAZODONE HCL 50 MG PO TABS
100.0000 mg | ORAL_TABLET | Freq: Every day | ORAL | Status: DC
  Administered 2019-11-10 – 2019-11-12 (×3): 100 mg via ORAL
  Filled 2019-11-10 (×3): qty 2

## 2019-11-10 MED ORDER — FOLIC ACID 1 MG PO TABS
1.0000 mg | ORAL_TABLET | Freq: Every day | ORAL | Status: DC
  Administered 2019-11-10 – 2019-11-13 (×4): 1 mg via ORAL
  Filled 2019-11-10 (×4): qty 1

## 2019-11-10 MED ORDER — LORAZEPAM 2 MG/ML IJ SOLN
0.0000 mg | Freq: Four times a day (QID) | INTRAMUSCULAR | Status: DC
  Administered 2019-11-10: 2 mg via INTRAVENOUS
  Filled 2019-11-10: qty 1

## 2019-11-10 MED ORDER — POTASSIUM CHLORIDE CRYS ER 20 MEQ PO TBCR
40.0000 meq | EXTENDED_RELEASE_TABLET | ORAL | Status: AC
  Administered 2019-11-10 (×2): 40 meq via ORAL
  Filled 2019-11-10 (×2): qty 2

## 2019-11-10 MED ORDER — SODIUM CHLORIDE 0.9% FLUSH
3.0000 mL | Freq: Once | INTRAVENOUS | Status: AC
  Administered 2019-11-10: 3 mL via INTRAVENOUS

## 2019-11-10 MED ORDER — POTASSIUM CHLORIDE CRYS ER 20 MEQ PO TBCR
40.0000 meq | EXTENDED_RELEASE_TABLET | Freq: Once | ORAL | Status: AC
  Administered 2019-11-10: 40 meq via ORAL
  Filled 2019-11-10: qty 2

## 2019-11-10 MED ORDER — ACETAMINOPHEN 650 MG RE SUPP: 650.0000 mg | Freq: Four times a day (QID) | RECTAL | Status: DC | PRN

## 2019-11-10 MED ORDER — ONDANSETRON HCL 4 MG/2ML IJ SOLN
4.0000 mg | Freq: Four times a day (QID) | INTRAMUSCULAR | Status: DC | PRN
  Administered 2019-11-10 – 2019-11-11 (×2): 4 mg via INTRAVENOUS
  Filled 2019-11-10 (×3): qty 2

## 2019-11-10 MED ORDER — LORAZEPAM 1 MG PO TABS
1.0000 mg | ORAL_TABLET | ORAL | Status: AC | PRN
  Administered 2019-11-11: 2 mg via ORAL
  Administered 2019-11-12 – 2019-11-13 (×2): 1 mg via ORAL
  Filled 2019-11-10: qty 1
  Filled 2019-11-10 (×2): qty 2
  Filled 2019-11-10: qty 1

## 2019-11-10 MED ORDER — LORAZEPAM 2 MG/ML IJ SOLN: 0.0000 mg | Freq: Two times a day (BID) | INTRAMUSCULAR | Status: DC

## 2019-11-10 MED ORDER — ACETAMINOPHEN 325 MG PO TABS
650.0000 mg | ORAL_TABLET | Freq: Four times a day (QID) | ORAL | Status: DC | PRN
  Administered 2019-11-11 – 2019-11-12 (×3): 650 mg via ORAL
  Filled 2019-11-10 (×3): qty 2

## 2019-11-10 MED ORDER — INFLUENZA VAC SPLIT QUAD 0.5 ML IM SUSY
0.5000 mL | PREFILLED_SYRINGE | INTRAMUSCULAR | Status: AC
  Administered 2019-11-13: 0.5 mL via INTRAMUSCULAR
  Filled 2019-11-10: qty 0.5

## 2019-11-10 MED ORDER — LORAZEPAM 1 MG PO TABS: 0.0000 mg | ORAL_TABLET | Freq: Four times a day (QID) | ORAL | Status: DC

## 2019-11-10 MED ORDER — THIAMINE HCL 100 MG PO TABS
100.0000 mg | ORAL_TABLET | Freq: Every day | ORAL | Status: DC
  Administered 2019-11-11 – 2019-11-13 (×3): 100 mg via ORAL
  Filled 2019-11-10 (×3): qty 1

## 2019-11-10 MED ORDER — ADULT MULTIVITAMIN W/MINERALS CH
1.0000 | ORAL_TABLET | Freq: Every day | ORAL | Status: DC
  Administered 2019-11-10 – 2019-11-13 (×4): 1 via ORAL
  Filled 2019-11-10 (×4): qty 1

## 2019-11-10 MED ORDER — LORAZEPAM 1 MG PO TABS: 0.0000 mg | ORAL_TABLET | Freq: Two times a day (BID) | ORAL | Status: DC

## 2019-11-10 MED ORDER — LISINOPRIL 20 MG PO TABS
20.0000 mg | ORAL_TABLET | Freq: Every day | ORAL | Status: DC
  Administered 2019-11-10 – 2019-11-13 (×4): 20 mg via ORAL
  Filled 2019-11-10 (×4): qty 1

## 2019-11-10 MED ORDER — LORAZEPAM 2 MG/ML IJ SOLN
1.0000 mg | INTRAMUSCULAR | Status: AC | PRN
  Administered 2019-11-10: 2 mg via INTRAVENOUS
  Administered 2019-11-10: 1 mg via INTRAVENOUS
  Administered 2019-11-10: 2 mg via INTRAVENOUS
  Administered 2019-11-11: 1 mg via INTRAVENOUS
  Filled 2019-11-10 (×3): qty 1

## 2019-11-10 MED ORDER — SODIUM CHLORIDE 0.9 % IV BOLUS
1000.0000 mL | Freq: Once | INTRAVENOUS | Status: AC
  Administered 2019-11-10: 1000 mL via INTRAVENOUS

## 2019-11-10 MED ORDER — PHENOL 1.4 % MT LIQD: 1.0000 | OROMUCOSAL | Status: DC | PRN

## 2019-11-10 MED ORDER — THIAMINE HCL 100 MG/ML IJ SOLN: 100.0000 mg | Freq: Every day | INTRAMUSCULAR | Status: DC

## 2019-11-10 MED ORDER — CHLORDIAZEPOXIDE HCL 25 MG PO CAPS
25.0000 mg | ORAL_CAPSULE | Freq: Four times a day (QID) | ORAL | Status: DC
  Administered 2019-11-10 – 2019-11-12 (×9): 25 mg via ORAL
  Filled 2019-11-10 (×9): qty 1

## 2019-11-10 MED ORDER — THIAMINE HCL 100 MG PO TABS: 100.0000 mg | ORAL_TABLET | Freq: Every day | ORAL | Status: DC

## 2019-11-10 MED ORDER — HYDRALAZINE HCL 20 MG/ML IJ SOLN: 5.0000 mg | INTRAMUSCULAR | Status: DC | PRN

## 2019-11-10 MED ORDER — THIAMINE HCL 100 MG/ML IJ SOLN
100.0000 mg | Freq: Every day | INTRAMUSCULAR | Status: DC
  Administered 2019-11-10: 100 mg via INTRAVENOUS
  Filled 2019-11-10: qty 2

## 2019-11-10 NOTE — Procedures (Signed)
ELECTROENCEPHALOGRAM REPORT   Patient: [name] [employer].       Room #: 2C05C EEG No. ID: [id number] Age: 33 y.o.        Sex: male Referring Physician: Starla Link Report Date:  11/10/2019        Interpreting Physician: Alexis Goodell  History: Adam Gloss Trembath [name] is an 33 y.o. male with seizure like activity  Medications:  Librium, Zestril, Desyrel  Conditions of Recording:  This is a 21 channel routine scalp EEG performed with bipolar and monopolar montages arranged in accordance to the international 10/20 system of electrode placement. One channel was dedicated to EKG recording.  The patient is in the awake, drowsy and asleep states.  Description:  The waking background activity consists of a low voltage, symmetrical, fairly well organized, 9-10 Hz alpha activity, seen from the parieto-occipital and posterior temporal regions.  Low voltage fast activity, poorly organized, is seen anteriorly and is at times superimposed on more posterior regions.  A mixture of theta and alpha rhythms are seen from the central and temporal regions. The patient drowses with slowing to irregular, low voltage theta and beta activity.   The patient goes in to a light sleep with symmetrical sleep spindles, vertex central sharp transients and irregular slow activity.  No epileptiform activity is noted.   Hyperventilation was not performed.  Intermittent photic stimulation was performed and elicits a symmetrical driving response but fails to elicit any abnormalities.  IMPRESSION: Normal electroencephalogram, awake, asleep and with activation procedures. There are no focal lateralizing or epileptiform features.   Alexis Goodell, MD Neurology [phone] 11/10/2019, 1:46 PM

## 2019-11-10 NOTE — TOC Initial Note (Signed)
Transition of Care (TOC) - Initial/Assessment Note    Patient Details  Name: Adam [name]. MRN: [id number] Date of Birth: 1985-12-27  [facility] CM/SW Contact:    Adam [name], [place] Phone Number: [phone] 11/10/2019, 11:11 AM  Clinical Narrative:                  CSW met with patient at bedside, attempted to complete SBIRT for substance abuse assessment. Patient answered first question stating he was drinking the day admitted to hospital, however refused to answer the other questions and acted as if he were asleep. CSW provided substance abuse resource packet left on bedside table, patient nodded acknowledging this was left but did not speak to CSW.   CSW spoke with RN, patient currently on no medications to illicit such extreme drowsiness for patient to appear so unresponsive, appears this is behavioral issue.   Expected Discharge Plan: Home/Self Care Barriers to Discharge: Continued Medical Work up   Patient Goals and CMS Choice   CMS Medicare.gov Compare Post Acute Care list provided to:: Patient Choice offered to / list presented to : Patient  Expected Discharge Plan and Services Expected Discharge Plan: Home/Self Care                                              Prior Living Arrangements/Services   Lives with:: Spouse, Minor Children Patient language and need for interpreter reviewed:: Yes Do you feel safe going back to the place where you live?: Yes      Need for Family Participation in Patient Care: No (Comment) Care giver support system in place?: Yes (comment)   Criminal Activity/Legal Involvement Pertinent to Current Situation/Hospitalization: No - Comment as needed  Activities of Daily Living      Permission Sought/Granted Permission sought to share information with : Case Manager, Customer service manager, Family Supports Permission granted to share information with : Yes, Verbal Permission Granted  Share  Information with NAME: Adam [name]     Permission granted to share info w Relationship: spouse  Permission granted to share info w Contact Information: [phone]  Emotional Assessment Appearance:: Appears stated age Attitude/Demeanor/Rapport: Guarded, Lethargic, Uncooperative, Unresponsive Affect (typically observed): Flat, Guarded, Quiet Orientation: : Oriented to Self, Oriented to Place, Oriented to  Time, Oriented to Situation Alcohol / Substance Use: Not Applicable Psych Involvement: No (comment)  Admission diagnosis:  Atypical chest pain [R07.89] Alcohol withdrawal seizure ([place]) [O17.510, R56.9] Alcohol withdrawal seizure without complication ([place]) [C58.527, R56.9] Patient Active Problem List   Diagnosis Date Noted  . Alcohol withdrawal seizure ([place]) 11/10/2019  . Benzodiazepine withdrawal ([place]) 11/10/2019  . Hypokalemia 11/10/2019  . Alcohol withdrawal ([place]) 09/24/2019  . Chest pain 09/24/2019  . Alcohol withdrawal seizure without complication ([place])   . Pneumothorax 04/28/2019  . Essential hypertension 06/25/2010   PCP:  Patient, No Pcp Per Pharmacy:   [facility] [address] ([address] [place] [address] [place]  [address] Phone: [phone] Fax: [phone]     Social Determinants of Health (SDOH) Interventions    Readmission Risk Interventions No flowsheet data found.

## 2019-11-10 NOTE — ED Notes (Addendum)
Pt advised that there is a history of seizures. Pt states that today he had a seizure at home. The chest pain that the pt experienced came after the onset of the seizure. Pt claims that the seizures come with the detox of ETOH and xanax

## 2019-11-10 NOTE — Consult Note (Addendum)
Telepsych Consultation   Reason for Consult: ''severe depression'' Referring Physician:  Kara Dies Location of Patient: [facility] Location of Provider: [facility]  Patient Identification: Adam [name]. MRN:  [id number] Principal Diagnosis: Alcohol withdrawal (HCC) Diagnosis:  Principal Problem:   Alcohol withdrawal (HCC) Active Problems:   Chest pain   Alcohol withdrawal seizure (HCC)   Benzodiazepine withdrawal (HCC)   Hypokalemia   Adjustment disorder with depressed mood   Total Time spent with patient: 45 minutes  Subjective:   Adam [name]. is a 33 y.o. male patient admitted with seizure.  HPI:   33 y.o. male with medical history significant of hypertension, alcohol dependence but denies any history of mental illness. He states that he was admitted to the hospital after he had alcohol related seizure episode at home. He states that he normally drinks 1/5 of liquor daily but stopped drinking alcohol 2 days ago in an attempt to self detox. He reports history of alcohol use disorder dating back to age 59 but decided to be sober few days ago.Patient reports that he had a personal tragedy about 4 months ago but refused to elaborate because he does not like to talk about it. However, he states that he has been getting increaseingly depressed about the tragedy and has been considering talking to a counselor before he ended up in the hospital yesterday. He endorses poor appetite, sleep problem,  low energy level, feeling hopeless but denies self harming thoughts, psychosis or delusional thinking. Patient is requesting to be placed on ant-depressant and would like to be referred to a psychiatrist upon discharge. Patient denies illicit drug use.  Past Psychiatric History: none reported by the patient  Risk to Self:  denies Risk to Others:  denies Prior Inpatient Therapy:  N/A Prior Outpatient Therapy:   None Past Medical History:  Past Medical  History:  Diagnosis Date  . Eating disorder    history of bulemia  . HYPERTENSION 06/25/2010  . Hypertension    History reviewed. No pertinent surgical history. Family History:  Family History  Problem Relation Age of Onset  . Cancer Mother        lung  . Hypertension Father    Family Psychiatric  History: Patient's father is an alcoholic Social History:  Social History   Substance and Sexual Activity  Alcohol Use Yes     Social History   Substance and Sexual Activity  Drug Use Not Currently    Social History   Socioeconomic History  . Marital status: Married    Spouse name: Not on file  . Number of children: Not on file  . Years of education: Not on file  . Highest education level: Not on file  Occupational History  . Not on file  Tobacco Use  . Smoking status: Current Every Day Smoker  . Smokeless tobacco: Current User  Substance and Sexual Activity  . Alcohol use: Yes  . Drug use: Not Currently  . Sexual activity: Not on file  Other Topics Concern  . Not on file  Social History Narrative   ** Merged History Encounter **       Social Determinants of Health   Financial Resource Strain:   . Difficulty of Paying Living Expenses: Not on file  Food Insecurity:   . Worried About Programme researcher, broadcasting/film/video in the Last Year: Not on file  . Ran Out of Food in the Last Year: Not on file  Transportation Needs:   . Lack  of Transportation (Medical): Not on file  . Lack of Transportation (Non-Medical): Not on file  Physical Activity:   . Days of Exercise per Week: Not on file  . Minutes of Exercise per Session: Not on file  Stress:   . Feeling of Stress : Not on file  Social Connections:   . Frequency of Communication with Friends and Family: Not on file  . Frequency of Social Gatherings with Friends and Family: Not on file  . Attends Religious Services: Not on file  . Active Member of Clubs or Organizations: Not on file  . Attends Archivist Meetings: Not on  file  . Marital Status: Not on file   Additional Social History:    Allergies:  No Known Allergies  Labs:  Results for orders placed or performed during the hospital encounter of 11/10/19 (from the past 48 hour(s))  Rapid urine drug screen (hospital performed)     Status: None   Collection Time: 11/10/19  3:55 AM  Result Value Ref Range   Opiates NONE DETECTED NONE DETECTED   Cocaine NONE DETECTED NONE DETECTED   Benzodiazepines NONE DETECTED NONE DETECTED   Amphetamines NONE DETECTED NONE DETECTED   Tetrahydrocannabinol NONE DETECTED NONE DETECTED   Barbiturates NONE DETECTED NONE DETECTED    Comment: (NOTE) DRUG SCREEN FOR MEDICAL PURPOSES ONLY.  IF CONFIRMATION IS NEEDED FOR ANY PURPOSE, NOTIFY LAB WITHIN 5 DAYS. LOWEST DETECTABLE LIMITS FOR URINE DRUG SCREEN Drug Class                     Cutoff (ng/mL) Amphetamine and metabolites    1000 Barbiturate and metabolites    200 Benzodiazepine                 299 Tricyclics and metabolites     300 Opiates and metabolites        300 Cocaine and metabolites        300 THC                            50 Performed at [facility], [place] [address], [place], [place] [address]   Basic metabolic panel     Status: Abnormal   Collection Time: 11/10/19  3:56 AM  Result Value Ref Range   Sodium 132 (L) 135 - 145 mmol/L   Potassium 3.2 (L) 3.5 - 5.1 mmol/L   Chloride 95 (L) 98 - 111 mmol/L   CO2 25 22 - 32 mmol/L   Glucose, Bld 138 (H) 70 - 99 mg/dL   BUN 9 6 - 20 mg/dL   Creatinine, Ser 1.08 0.61 - 1.24 mg/dL   Calcium 9.1 8.9 - 10.3 mg/dL   GFR calc non Af Amer >60 >60 mL/min   GFR calc Af Amer >60 >60 mL/min   Anion gap 12 5 - 15    Comment: Performed at [place] [address] [place], [place] [address]  CBC     Status: Abnormal   Collection Time: 11/10/19  3:56 AM  Result Value Ref Range   WBC 9.8 4.0 - 10.5 K/uL   RBC 4.81 4.22 - 5.81 MIL/uL   Hemoglobin 15.9 13.0 - 17.0 g/dL   HCT 45.0 39.0 - 52.0 %    MCV 93.6 80.0 - 100.0 fL   MCH 33.1 26.0 - 34.0 pg   MCHC 35.3 30.0 - 36.0 g/dL   RDW 11.4 (L) 11.5 -  15.5 %   Platelets 242 150 - 400 K/uL   nRBC 0.0 0.0 - 0.2 %    Comment: Performed at [facility], [address], [place], [place] [address]  Troponin I (High Sensitivity)     Status: None   Collection Time: 11/10/19  3:56 AM  Result Value Ref Range   Troponin I (High Sensitivity) 8 <18 ng/L    Comment: (NOTE) Elevated high sensitivity troponin I (hsTnI) values and significant  changes across serial measurements may suggest ACS but many other  chronic and acute conditions are known to elevate hsTnI results.  Refer to the "Links" section for chest pain algorithms and additional  guidance. Performed at [facility], [address] [place], [place] [address]   Respiratory Panel by RT PCR (Flu A&B, Covid) - Nasopharyngeal Swab     Status: None   Collection Time: 11/10/19  5:39 AM   Specimen: Nasopharyngeal Swab  Result Value Ref Range   SARS Coronavirus 2 by RT PCR NEGATIVE NEGATIVE    Comment: (NOTE) SARS-CoV-2 target nucleic acids are NOT DETECTED. The SARS-CoV-2 RNA is generally detectable in upper respiratoy specimens during the acute phase of infection. The lowest concentration of SARS-CoV-2 viral copies this assay can detect is 131 copies/mL. A negative result does not preclude SARS-Cov-2 infection and should not be used as the sole basis for treatment or other patient management decisions. A negative result may occur with  improper specimen collection/handling, submission of specimen other than nasopharyngeal swab, presence of viral mutation(s) within the areas targeted by this assay, and inadequate number of viral copies (<131 copies/mL). A negative result must be combined with clinical observations, patient history, and epidemiological information. The expected result is Negative. Fact Sheet for Patients:  https://www.moore.com/ Fact  Sheet for Healthcare Providers:  https://www.young.biz/ This test is not yet ap proved or cleared by the [place] FDA and  has been authorized for detection and/or diagnosis of SARS-CoV-2 by FDA under an Emergency Use Authorization (EUA). This EUA will remain  in effect (meaning this test can be used) for the duration of the COVID-19 declaration under Section 564(b)(1) of the Act, 21 U.S.C. section 360bbb-3(b)(1), unless the authorization is terminated or revoked sooner.    Influenza A by PCR NEGATIVE NEGATIVE   Influenza B by PCR NEGATIVE NEGATIVE    Comment: (NOTE) The Xpert Xpress SARS-CoV-2/FLU/RSV assay is intended as an aid in  the diagnosis of influenza from Nasopharyngeal swab specimens and  should not be used as a sole basis for treatment. Nasal washings and  aspirates are unacceptable for Xpert Xpress SARS-CoV-2/FLU/RSV  testing. Fact Sheet for Patients: https://www.moore.com/ Fact Sheet for Healthcare Providers: https://www.young.biz/ This test is not yet approved or cleared by the [place] FDA and  has been authorized for detection and/or diagnosis of SARS-CoV-2 by  FDA under an Emergency Use Authorization (EUA). This EUA will remain  in effect (meaning this test can be used) for the duration of the  Covid-19 declaration under Section 564(b)(1) of the Act, 21  U.S.C. section 360bbb-3(b)(1), unless the authorization is  terminated or revoked. Performed at [facility], [address] [place], [place] [address]   Ethanol     Status: None   Collection Time: 11/10/19  5:39 AM  Result Value Ref Range   Alcohol, Ethyl (B) <10 <10 mg/dL    Comment: (NOTE) Lowest detectable limit for serum alcohol is 10 mg/dL. For medical purposes only. Performed at [facility], [address] [place], [place] [address]   Troponin I (High Sensitivity)     Status: None   Collection Time: 11/10/19  5:46 AM   Result Value Ref Range   Troponin I (High Sensitivity) 4 <18 ng/L    Comment: (NOTE) Elevated high sensitivity troponin I (hsTnI) values and significant  changes across serial measurements may suggest ACS but many other  chronic and acute conditions are known to elevate hsTnI results.  Refer to the "Links" section for chest pain algorithms and additional  guidance. Performed at [facility], [address] [place], [place] [address]   Magnesium     Status: None   Collection Time: 11/10/19  5:46 AM  Result Value Ref Range   Magnesium 1.8 1.7 - 2.4 mg/dL    Comment: Performed at [facility], [address] [place], [place] [address]  Phosphorus     Status: Abnormal   Collection Time: 11/10/19  5:46 AM  Result Value Ref Range   Phosphorus 2.4 (L) 2.5 - 4.6 mg/dL    Comment: Performed at [facility], [address] [place], [place] [address]  Comprehensive metabolic panel     Status: Abnormal   Collection Time: 11/10/19  5:46 AM  Result Value Ref Range   Sodium 134 (L) 135 - 145 mmol/L   Potassium 3.2 (L) 3.5 - 5.1 mmol/L   Chloride 98 98 - 111 mmol/L   CO2 25 22 - 32 mmol/L   Glucose, Bld 109 (H) 70 - 99 mg/dL   BUN 9 6 - 20 mg/dL   Creatinine, Ser 3.241.03 0.61 - 1.24 mg/dL   Calcium 8.6 (L) 8.9 - 10.3 mg/dL   Total Protein 6.3 (L) 6.5 - 8.1 g/dL   Albumin 3.9 3.5 - 5.0 g/dL   AST 54 (H) 15 - 41 U/L   ALT 35 0 - 44 U/L   Alkaline Phosphatase 45 38 - 126 U/L   Total Bilirubin 1.2 0.3 - 1.2 mg/dL   GFR calc non Af Amer >60 >60 mL/min   GFR calc Af Amer >60 >60 mL/min   Anion gap 11 5 - 15    Comment: Performed at [facility], [address] [place], [place] [address]  MRSA PCR Screening     Status: None   Collection Time: 11/10/19  8:18 AM   Specimen: Nasal Mucosa; Nasopharyngeal  Result Value Ref Range   MRSA by PCR NEGATIVE NEGATIVE    Comment:        The GeneXpert MRSA Assay (FDA approved for NASAL specimens only), is one component of  a comprehensive MRSA colonization surveillance program. It is not intended to diagnose MRSA infection nor to guide or monitor treatment for MRSA infections. Performed at [facility], [address] [place], [place] [address]     Medications:  Current Facility-Administered Medications  Medication Dose Route Frequency Provider Last Rate Last Admin  . acetaminophen (TYLENOL) tablet 650 mg  650 mg Oral Q6H PRN John Giovanniathore, Vasundhra, MD       Or  . acetaminophen (TYLENOL) suppository 650 mg  650 mg Rectal Q6H PRN John Giovanniathore, Vasundhra, MD      . chlordiazePOXIDE (LIBRIUM) capsule 25 mg  25 mg Oral Q6H John Giovanniathore, Vasundhra, MD   25 mg at 11/10/19 0705  . folic acid (FOLVITE) tablet 1 mg  1 mg Oral Daily John Giovanniathore, Vasundhra, MD   1 mg at 11/10/19 0913  . hydrALAZINE (APRESOLINE)  injection 5 mg  5 mg Intravenous Q4H PRN John Giovanni, MD      . lisinopril (ZESTRIL) tablet 20 mg  20 mg Oral Daily John Giovanni, MD   20 mg at 11/10/19 0914  . LORazepam (ATIVAN) tablet 1-4 mg  1-4 mg Oral Q1H PRN John Giovanni, MD       Or  . LORazepam (ATIVAN) injection 1-4 mg  1-4 mg Intravenous Q1H PRN John Giovanni, MD   2 mg at 11/10/19 0913  . multivitamin with minerals tablet 1 tablet  1 tablet Oral Daily John Giovanni, MD   1 tablet at 11/10/19 0914  . potassium chloride SA (KLOR-CON) CR tablet 40 mEq  40 mEq Oral Q4H Alekh, Kshitiz, MD   40 mEq at 11/10/19 0913  . thiamine tablet 100 mg  100 mg Oral Daily John Giovanni, MD       Or  . thiamine (B-1) injection 100 mg  100 mg Intravenous Daily John Giovanni, MD   100 mg at 11/10/19 0912    Musculoskeletal: Strength & Muscle Tone: not tested Gait & Station: normal Patient leans: N/A  Psychiatric Specialty Exam: Physical Exam  Psychiatric: His speech is normal and behavior is normal. Judgment and thought content normal. Cognition and memory are normal. He exhibits a depressed mood.    Review of Systems   Constitutional: Negative.   HENT: Negative.   Eyes: Negative.   Respiratory: Negative.   Cardiovascular: Negative.   Gastrointestinal: Negative.   Endocrine: Negative.   Genitourinary: Negative.   Musculoskeletal: Negative.   Allergic/Immunologic: Negative.   Neurological: Positive for seizures.  Hematological: Negative.   Psychiatric/Behavioral: Positive for dysphoric mood.    Blood pressure 110/67, pulse (!) 57, temperature 97.7 F (36.5 C), temperature source Oral, resp. rate 13, height  (1.676 m), weight 77.1 kg, SpO2 98 %.Body mass index is 27.44 kg/m.  General Appearance: Casual  Eye Contact:  Good  Speech:  Clear and Coherent  Volume:  Decreased  Mood:  Dysphoric  Affect:  Constricted  Thought Process:  Coherent and Linear  Orientation:  Full (Time, Place, and Person)  Thought Content:  Logical  Suicidal Thoughts:  No  Homicidal Thoughts:  No  Memory:  Immediate;   Good Recent;   Good Remote;   Good  Judgement:  Intact  Insight:  Fair  Psychomotor Activity:  Psychomotor Retardation  Concentration:  Concentration: Good and Attention Span: Good  Recall:  Good  Fund of Knowledge:  Good  Language:  Good  Akathisia:  No  Handed:  Right  AIMS (if indicated):     Assets:  Communication Skills Desire for Improvement  ADL's:  Intact  Cognition:  WNL  Sleep:   poor     Treatment Plan Summary: 33 year old male with long history of alcohol use disorder who was admitted after he has a seizure episode at home. Patient reports worsening depression since a close family member suddenly passed about 4 months ago. Today, patient denies self harming thoughts but agreed to be placed on medication for depression.  Recommendations: -Continue Lorazepam alcohol detox and CIWA protocol. -Consider Trazodone 100 mg daily at bedtime  for depression/insomnia/alcohol use disorder. -Consider social worker consult to refer patient to alcohol treatment facility and outpatient  psychiatrist for medication management/counseling   Disposition: No evidence of imminent risk to self or others at present.   Patient does not meet criteria for psychiatric inpatient admission. Supportive therapy provided about ongoing stressors. Psychiatric service is siging out  This service was provided via telemedicine using a 2-way, interactive audio and video technology.  Names of all persons participating in this telemedicine service and their role in this encounter. Name: Tarvares Lant. Role: Patient  Name: Nigel Sloop Role: RN  Name: Thedore Mins, MD Role: Psychiatrist  Name:  Role:     Thedore Mins, MD 11/10/2019 11:53 AM

## 2019-11-10 NOTE — Progress Notes (Signed)
Patient ID: Adam [name] Adam [name]., male   DOB: 1986-02-07, 33 y.o.   MRN: [id number] Patient was admitted early this morning for seizure with concern for possible alcohol withdrawal seizure.  Patient seen and examined at bedside and plan of care discussed with him.  I have reviewed his medical records including this morning's H&P, labs and medications myself.  I will also discuss with neurology regarding need for antiseizure medication as this is his second time in 2 months with alcohol withdrawal seizure.  Repeat a.m. labs.  Continue CIWA protocol.  Social worker consult for alcohol abuse.

## 2019-11-10 NOTE — ED Triage Notes (Signed)
Pt presents with midsternal CP onset @ midnight while playing with kids. + radiation to L jaw and arm. Pt states he is self detoxing from etoh and Xanax.

## 2019-11-10 NOTE — H&P (Signed)
History and Physical    [name] NWG:[id number] DOB: 08/23/1986 DOA: 11/10/2019  PCP: Patient, No Pcp Per Patient coming from: Home  Chief Complaint: Seizure  HPI: Adam [name]. is a 33 y.o. male with medical history significant of hypertension, alcohol dependence, history of alcohol withdrawal seizures presenting to the ED after having a seizure at home.  Patient states he normally drinks 1/5 of liquor daily but stopped drinking alcohol 2 days ago in an attempt to self detox.  He had a personal tragedy in his life and as a result started taking Xanax given to him by someone.  He was previously taking a lot of Xanax but has been trying to cut down recently.  He is not sure what dose of Xanax he takes.  He last took one half of a pill yesterday morning.  Last night around midnight while playing with his children he had a seizure.  He fell on the ground and is not sure if he hit his head.  He is having a slight headache.  States his wife found him on the floor having a seizure.  Soon after the seizure, he experienced substernal chest pain which felt like both pressure and a stabbing sensation.  His arms and legs were tingling.  Symptoms resolved after a few minutes.  Denies any fevers or recent illness.  Denies cough, shortness of breath, nausea, vomiting, abdominal pain, diarrhea, or dysuria.  He ran out of his home blood pressure medication lisinopril 2 days ago.  Of note, patient presented to the ED on 12/15 with complaints of seizure and it seems he left before work-up could be finished.  I do not see an ED provider note from this visit.  ED Course: Blood pressure elevated systolic in 160s 578I.  Remainder of vital signs stable.  Afebrile and no leukocytosis.  UDS negative.  EKG with new inverted T waves in inferior and lateral leads.  Initial high-sensitivity troponin negative, repeat pending.  Blood ethanol level pending.  Chest x-ray showing no active disease.  Patient  was started on Ativan per CIWA protocol.  Received 1 L normal saline bolus.  Review of Systems:  All systems reviewed and apart from history of presenting illness, are negative.  Past Medical History:  Diagnosis Date  . Eating disorder    history of bulemia  . HYPERTENSION 06/25/2010  . Hypertension     History reviewed. No pertinent surgical history.   reports that he has been smoking. He uses smokeless tobacco. He reports current alcohol use. He reports previous drug use.  No Known Allergies  Family History  Problem Relation Age of Onset  . Cancer Mother        lung  . Hypertension Father     Prior to Admission medications   Medication Sig Start Date End Date Taking? Authorizing Provider  lisinopril (ZESTRIL) 20 MG tablet Take 20 mg by mouth daily. 07/28/19  Yes [provider]  Multiple Vitamin (MULTIVITAMIN WITH MINERALS) TABS tablet Take 1 tablet by mouth daily. 09/27/19  Yes Hongalgi, Maximino Greenland, MD  folic acid (FOLVITE) 1 MG tablet Take 1 tablet (1 mg total) by mouth daily. Patient not taking: Reported on 11/10/2019 09/27/19   Elease Etienne, MD  thiamine 100 MG tablet Take 1 tablet (100 mg total) by mouth daily. Patient not taking: Reported on 11/10/2019 09/27/19   Elease Etienne, MD    Physical Exam: Vitals:   11/10/19 0530 11/10/19 0531 11/10/19 0615 11/10/19  0630  BP: (!) 172/99 (!) 172/99 (!) 144/87 134/81  Pulse:  96 91 77  Resp: Temp:      TempSrc:      SpO2:   97% 95%    Physical Exam  Constitutional: He is oriented to person, place, and time.  Tremulous  HENT:  Head: Normocephalic.  Eyes: Right eye exhibits no discharge. Left eye exhibits no discharge.  Cardiovascular: Normal rate, regular rhythm and intact distal pulses.  Pulmonary/Chest: Effort normal and breath sounds normal. No respiratory distress. He has no wheezes. He has no rales.  Abdominal: Soft. Bowel sounds are normal. He exhibits no distension. There is no abdominal  tenderness. There is no guarding.  Musculoskeletal:        General: No edema.     Cervical back: Neck supple.  Neurological: He is alert and oriented to person, place, and time.  No focal motor or sensory deficit  Skin: Skin is warm and dry. He is not diaphoretic.     Labs on Admission: I have personally reviewed following labs and imaging studies  CBC: Recent Labs  Lab 11/06/19 1155 11/10/19 0356  WBC 3.3* 9.8  HGB 17.7* 15.9  HCT 49.6 45.0  MCV 92.0 93.6  PLT 214 242   Basic Metabolic Panel: Recent Labs  Lab 11/06/19 1155 11/10/19 0356  NA 136 132*  K 4.0 3.2*  CL 98 95*  CO2 26 25  GLUCOSE 102* 138*  BUN 12 9  CREATININE 1.01 1.08  CALCIUM 8.9 9.1   GFR: Estimated Creatinine Clearance: 96.9 mL/min (by C-G formula based on SCr of 1.08 mg/dL). Liver Function Tests: Recent Labs  Lab 11/06/19 1155  AST 40  ALT 28  ALKPHOS 55  BILITOT 1.2  PROT 7.4  ALBUMIN 4.4   No results for input(s): LIPASE, AMYLASE in the last 168 hours. No results for input(s): AMMONIA in the last 168 hours. Coagulation Profile: No results for input(s): INR, PROTIME in the last 168 hours. Cardiac Enzymes: No results for input(s): CKTOTAL, CKMB, CKMBINDEX, TROPONINI in the last 168 hours. BNP (last 3 results) No results for input(s): PROBNP in the last 8760 hours. HbA1C: No results for input(s): HGBA1C in the last 72 hours. CBG: No results for input(s): GLUCAP in the last 168 hours. Lipid Profile: No results for input(s): CHOL, HDL, LDLCALC, TRIG, CHOLHDL, LDLDIRECT in the last 72 hours. Thyroid Function Tests: No results for input(s): TSH, T4TOTAL, FREET4, T3FREE, THYROIDAB in the last 72 hours. Anemia Panel: No results for input(s): VITAMINB12, FOLATE, FERRITIN, TIBC, IRON, RETICCTPCT in the last 72 hours. Urine analysis:    Component Value Date/Time   COLORURINE YELLOW 04/28/2019 1759   APPEARANCEUR CLEAR 04/28/2019 1759   LABSPEC 1.029 04/28/2019 1759   PHURINE 7.0  04/28/2019 1759   GLUCOSEU NEGATIVE 04/28/2019 1759   HGBUR NEGATIVE 04/28/2019 1759   BILIRUBINUR NEGATIVE 04/28/2019 1759   KETONESUR NEGATIVE 04/28/2019 1759   PROTEINUR NEGATIVE 04/28/2019 1759   NITRITE NEGATIVE 04/28/2019 1759   LEUKOCYTESUR NEGATIVE 04/28/2019 1759    Radiological Exams on Admission: DG Chest 2 View  Result Date: 11/10/2019 CLINICAL DATA:  Chest pain. EXAM: CHEST - 2 VIEW COMPARISON:  Radiograph and CT 09/24/2019 FINDINGS: The cardiomediastinal contours are normal. The lungs are clear. Pulmonary vasculature is normal. No consolidation, pleural effusion, or pneumothorax. No acute osseous abnormalities are seen. IMPRESSION: Negative radiographs of the chest. Electronically Signed   By: Narda Rutherford M.D.   On: 11/10/2019 04:13  EKG: Independently reviewed.  Sinus rhythm, heart rate 100.  Inverted T waves in inferior and lateral leads are new compared to prior tracing from 11/06/2019.  Assessment/Plan Principal Problem:   Alcohol withdrawal (HCC) Active Problems:   Chest pain   Alcohol withdrawal seizure (HCC)   Benzodiazepine withdrawal (HCC)   Hypokalemia   Acute alcohol withdrawal with seizure Patient normally drinks 1/5 of liquor daily and stop drinking alcohol 2 days ago in an attempt to self detox.  Last night around midnight he had a seizure.  Patient also presented to the ED on 12/15 with complaints of a seizure.  Appears anxious and tremulous at present.  CIWA score 16.  He had an admission for alcohol withdrawal seizure in November 2020 as well.  At risk for severe alcohol withdrawal symptoms/delirium tremens. -Progressive care CIWA protocol; Ativan as needed -Hepatic function was normal on labs done 12/15.  Start Librium 25 mg every 6 hours.  Recheck LFTs. -Monitor mag and Phos levels -Seizure precautions -Head CT as he had an unwitnessed fall and is complaining of a headache -EEG  Acute benzodiazepine withdrawal Patient does not have a  prescription for Xanax.  He has been getting this medication from someone he knows and does not know what dose he takes.  Does state that previously he was taking a lot of Xanax but has been trying to cut down recently.  Appears anxious and tremulous.  He had a seizure at home. -Ativan and Librium as above  Mild hypokalemia Potassium 3.2.  Likely secondary to ethanol use/decreased p.o. intake. -Replete potassium.  Check magnesium level and replete if low.  Continue to monitor electrolytes.  Atypical chest pain EKG with new inverted T waves in inferior and lateral leads.  High-sensitivity troponin negative.  Chest x-ray showing no active disease.  Currently chest pain-free.  Suspect EKG changes are related to demand ischemia in the setting of recent seizure. -Cardiac monitoring -Continue to trend troponin -Repeat EKG in a.m.  Uncontrolled hypertension Blood pressure elevated systolic in 160s 161W170s.  Patient ran out of lisinopril 2 days ago. -Resume home lisinopril -Hydralazine as needed for SBP >160  Depression/bereavement Patient reported having a personal tragedy but did not elaborate.  From chart review, his 31-year-old daughter passed away in a MVA.  Since then he has been drinking heavily and using benzodiazepines.  He had declined inpatient psychiatry consultation during his previous hospitalization in November.  Patient did tell me he has not seen a psychiatrist or counselor yet. -Please discuss inpatient psychiatry consultation with the patient again in the morning.  If he declines, please ensure outpatient psychiatry follow-up.  DVT prophylaxis: SCDs at this time.  Start chemical DVT prophylaxis if head CT negative for acute bleed. Code Status: Full code Family Communication: No family available at this time. Disposition Plan: Anticipate discharge after clinical improvement. Consults called: None Admission status: It is my clinical opinion that admission to INPATIENT is reasonable  and necessary in this 33 y.o. male . presenting with seizure secondary to acute alcohol and benzodiazepine withdrawal.  CIWA score 16.  At high risk for severe withdrawal symptoms/delirium tremens.  Needs close monitoring in the progressive care unit.  Given the aforementioned, the predictability of an adverse outcome is felt to be significant. I expect that the patient will require at least 2 midnights in the hospital to treat this condition.   The medical decision making on this patient was of high complexity and the patient is at high risk for clinical  deterioration, therefore this is a level 3 visit.  Adam Leff MD [facility] Pager [phone]  If 7PM-7AM, please contact night-coverage [email or web address] Password [email or web address]  11/10/2019, 7:01 AM

## 2019-11-10 NOTE — ED Notes (Signed)
ED TO INPATIENT HANDOFF REPORT  ED Nurse Name and Phone #: Myriam Jacobson [phone]  S Name/Age/Gender Adam Peoples Hilgeman Jr. 33 y.o. male Room/Bed: 036C/036C  Code Status   Code Status: Full Code  Home/SNF/Other Home Patient oriented to: self, place, time and situation Is this baseline? Yes   Triage Complete: Triage complete  Chief Complaint Alcohol withdrawal seizure (HCC) [T88.828, R56.9]  Triage Note Pt presents with midsternal CP onset @ midnight while playing with kids. + radiation to L jaw and arm. Pt states he is self detoxing from etoh and Xanax.     Allergies No Known Allergies  Level of Care/Admitting Diagnosis ED Disposition    ED Disposition Condition Comment   Admit  Hospital Area: [facility] [[id number]]  Level of Care: Progressive [102]  Admit to Progressive based on following criteria: MULTISYSTEM THREATS such as stable sepsis, metabolic/electrolyte imbalance with or without encephalopathy that is responding to early treatment.  Covid Evaluation: Asymptomatic Screening Protocol (No Symptoms)  Diagnosis: Alcohol withdrawal seizure [facility]) [[id number]]  Admitting Physician: John Giovanni [[id number]]  Attending Physician: John Giovanni [[id number]]  Estimated length of stay: past midnight tomorrow  Certification:: I certify this patient will need inpatient services for at least 2 midnights       B Medical/Surgery History Past Medical History:  Diagnosis Date  . Eating disorder    history of bulemia  . HYPERTENSION 06/25/2010  . Hypertension    History reviewed. No pertinent surgical history.   A IV Location/Drains/Wounds Patient Lines/Drains/Airways Status   Active Line/Drains/Airways    Name:   Placement date:   Placement time:   Site:   Days:   Peripheral IV 11/10/19 Right Forearm   11/10/19    0528    Forearm   less than 1   Wound / Incision (Open or Dehisced) 04/28/19 Laceration Back Posterior;Mid   04/28/19    0815    Back    196   Wound / Incision (Open or Dehisced) 04/28/19 Laceration Back Mid;Posterior   04/28/19    0815    Back   196          Intake/Output Last 24 hours  Intake/Output Summary (Last 24 hours) at 11/10/2019 0732 Last data filed at 11/10/2019 6553 Gross per 24 hour  Intake 1000 ml  Output --  Net 1000 ml    Labs/Imaging Results for orders placed or performed during the hospital encounter of 11/10/19 (from the past 48 hour(s))  Rapid urine drug screen (hospital performed)     Status: None   Collection Time: 11/10/19  3:55 AM  Result Value Ref Range   Opiates NONE DETECTED NONE DETECTED   Cocaine NONE DETECTED NONE DETECTED   Benzodiazepines NONE DETECTED NONE DETECTED   Amphetamines NONE DETECTED NONE DETECTED   Tetrahydrocannabinol NONE DETECTED NONE DETECTED   Barbiturates NONE DETECTED NONE DETECTED    Comment: (NOTE) DRUG SCREEN FOR MEDICAL PURPOSES ONLY.  IF CONFIRMATION IS NEEDED FOR ANY PURPOSE, NOTIFY LAB WITHIN 5 DAYS. LOWEST DETECTABLE LIMITS FOR URINE DRUG SCREEN Drug Class                     Cutoff (ng/mL) Amphetamine and metabolites    1000 Barbiturate and metabolites    200 Benzodiazepine                 200 Tricyclics and metabolites     300 Opiates and metabolites        300 Cocaine and  metabolites        300 THC                            50 Performed at [facility], [address] [place], [place] [address]   Basic metabolic panel     Status: Abnormal   Collection Time: 11/10/19  3:56 AM  Result Value Ref Range   Sodium 132 (L) 135 - 145 mmol/L   Potassium 3.2 (L) 3.5 - 5.1 mmol/L   Chloride 95 (L) 98 - 111 mmol/L   CO2 25 22 - 32 mmol/L   Glucose, Bld 138 (H) 70 - 99 mg/dL   BUN 9 6 - 20 mg/dL   Creatinine, Ser 6.04 0.61 - 1.24 mg/dL   Calcium 9.1 8.9 - 54.0 mg/dL   GFR calc non Af Amer >60 >60 mL/min   GFR calc Af Amer >60 >60 mL/min   Anion gap 12 5 - 15    Comment: Performed at [facility], [address], [place],  [place] [address]  CBC     Status: Abnormal   Collection Time: 11/10/19  3:56 AM  Result Value Ref Range   WBC 9.8 4.0 - 10.5 K/uL   RBC 4.81 4.22 - 5.81 MIL/uL   Hemoglobin 15.9 13.0 - 17.0 g/dL   HCT 14.7 82.9 - 56.2 %   MCV 93.6 80.0 - 100.0 fL   MCH 33.1 26.0 - 34.0 pg   MCHC 35.3 30.0 - 36.0 g/dL   RDW 13.0 (L) 86.5 - 78.4 %   Platelets 242 150 - 400 K/uL   nRBC 0.0 0.0 - 0.2 %    Comment: Performed at [facility], [address] [place], [place] [address]  Troponin I (High Sensitivity)     Status: None   Collection Time: 11/10/19  3:56 AM  Result Value Ref Range   Troponin I (High Sensitivity) 8 <18 ng/L    Comment: (NOTE) Elevated high sensitivity troponin I (hsTnI) values and significant  changes across serial measurements may suggest ACS but many other  chronic and acute conditions are known to elevate hsTnI results.  Refer to the "Links" section for chest pain algorithms and additional  guidance. Performed at [facility], [address], [place], [place] [address]   Respiratory Panel by RT PCR (Flu A&B, Covid) - Nasopharyngeal Swab     Status: None   Collection Time: 11/10/19  5:39 AM   Specimen: Nasopharyngeal Swab  Result Value Ref Range   SARS Coronavirus 2 by RT PCR NEGATIVE NEGATIVE    Comment: (NOTE) SARS-CoV-2 target nucleic acids are NOT DETECTED. The SARS-CoV-2 RNA is generally detectable in upper respiratoy specimens during the acute phase of infection. The lowest concentration of SARS-CoV-2 viral copies this assay can detect is 131 copies/mL. A negative result does not preclude SARS-Cov-2 infection and should not be used as the sole basis for treatment or other patient management decisions. A negative result may occur with  improper specimen collection/handling, submission of specimen other than nasopharyngeal swab, presence of viral mutation(s) within the areas targeted by this assay, and inadequate number of viral copies (<131 copies/mL). A  negative result must be combined with clinical observations, patient history, and epidemiological information. The expected result is Negative. Fact Sheet for Patients:  https://www.moore.com/ Fact Sheet for Healthcare Providers:  https://www.young.biz/ This test is not yet ap proved or cleared by the [place]  FDA and  has been authorized for detection and/or diagnosis of SARS-CoV-2 by FDA under an Emergency Use Authorization (EUA). This EUA will remain  in effect (meaning this test can be used) for the duration of the COVID-19 declaration under Section 564(b)(1) of the Act, 21 U.S.C. section 360bbb-3(b)(1), unless the authorization is terminated or revoked sooner.    Influenza A by PCR NEGATIVE NEGATIVE   Influenza B by PCR NEGATIVE NEGATIVE    Comment: (NOTE) The Xpert Xpress SARS-CoV-2/FLU/RSV assay is intended as an aid in  the diagnosis of influenza from Nasopharyngeal swab specimens and  should not be used as a sole basis for treatment. Nasal washings and  aspirates are unacceptable for Xpert Xpress SARS-CoV-2/FLU/RSV  testing. Fact Sheet for Patients: https://www.moore.com/https://www.fda.gov/media/142436/download Fact Sheet for Healthcare Providers: https://www.young.biz/https://www.fda.gov/media/142435/download This test is not yet approved or cleared by the [place] FDA and  has been authorized for detection and/or diagnosis of SARS-CoV-2 by  FDA under an Emergency Use Authorization (EUA). This EUA will remain  in effect (meaning this test can be used) for the duration of the  Covid-19 declaration under Section 564(b)(1) of the Act, 21  U.S.C. section 360bbb-3(b)(1), unless the authorization is  terminated or revoked. Performed at [facility], [address] [place], [place] [address]   Ethanol     Status: None   Collection Time: 11/10/19  5:39 AM  Result Value Ref Range   Alcohol, Ethyl (B) <10 <10 mg/dL    Comment: (NOTE) Lowest detectable limit for  serum alcohol is 10 mg/dL. For medical purposes only. Performed at [facility], [address] [place], [place] [address]   Troponin I (High Sensitivity)     Status: None   Collection Time: 11/10/19  5:46 AM  Result Value Ref Range   Troponin I (High Sensitivity) 4 <18 ng/L    Comment: (NOTE) Elevated high sensitivity troponin I (hsTnI) values and significant  changes across serial measurements may suggest ACS but many other  chronic and acute conditions are known to elevate hsTnI results.  Refer to the "Links" section for chest pain algorithms and additional  guidance. Performed at [facility], [address] [place], [place] [address]    DG Chest 2 View  Result Date: 11/10/2019 CLINICAL DATA:  Chest pain. EXAM: CHEST - 2 VIEW COMPARISON:  Radiograph and CT 09/24/2019 FINDINGS: The cardiomediastinal contours are normal. The lungs are clear. Pulmonary vasculature is normal. No consolidation, pleural effusion, or pneumothorax. No acute osseous abnormalities are seen. IMPRESSION: Negative radiographs of the chest. Electronically Signed   By: Narda RutherfordMelanie  Sanford M.D.   On: 11/10/2019 04:13   CT HEAD WO CONTRAST  Result Date: 11/10/2019 CLINICAL DATA:  Encephalopathy. Headache, head trauma. Seizure at home, history of seizures. EXAM: CT HEAD WITHOUT CONTRAST TECHNIQUE: Contiguous axial images were obtained from the base of the skull through the vertex without intravenous contrast. COMPARISON:  Head CT 09/23/2019 FINDINGS: Brain: No intracranial hemorrhage, mass effect, or midline shift. Mild atrophy for age. No hydrocephalus. The basilar cisterns are patent. No evidence of territorial infarct or acute ischemia. No extra-axial or intracranial fluid collection. Vascular: No hyperdense vessel or unexpected calcification. Skull: No fracture or focal lesion. Sinuses/Orbits: Paranasal sinuses and mastoid air cells are clear. The visualized orbits are unremarkable. Other: None.  IMPRESSION: 1.  No acute intracranial abnormality. 2. Mild atrophy for age. Electronically Signed   By: Narda RutherfordMelanie  Sanford M.D.   On: 11/10/2019 07:00    Pending  Labs [employer] (From admission, onward)    Start     Ordered   11/10/19 0612  Comprehensive metabolic panel  Once,   AD     11/10/19 0612   11/10/19 0610  Magnesium  Once,   AD     11/10/19 0612   11/10/19 0610  Phosphorus  Once,   AD     11/10/19 0612          Vitals/Pain Today's Vitals   11/10/19 0630 11/10/19 0700 11/10/19 0719 11/10/19 0727  BP: 134/81 123/67 (!) 148/82 (!) 148/82  Pulse: 77 87 81 81  Resp: 14 15 13    Temp:      TempSrc:      SpO2: 95% 97% 97%   PainSc:   0-No pain     Isolation Precautions No active isolations  Medications Medications  lisinopril (ZESTRIL) tablet 20 mg (has no administration in time range)  LORazepam (ATIVAN) tablet 1-4 mg (has no administration in time range)    Or  LORazepam (ATIVAN) injection 1-4 mg (has no administration in time range)  thiamine tablet 100 mg (has no administration in time range)    Or  thiamine (B-1) injection 100 mg (has no administration in time range)  folic acid (FOLVITE) tablet 1 mg (has no administration in time range)  multivitamin with minerals tablet 1 tablet (has no administration in time range)  acetaminophen (TYLENOL) tablet 650 mg (has no administration in time range)    Or  acetaminophen (TYLENOL) suppository 650 mg (has no administration in time range)  chlordiazePOXIDE (LIBRIUM) capsule 25 mg (25 mg Oral Given 11/10/19 0705)  hydrALAZINE (APRESOLINE) injection 5 mg (has no administration in time range)  sodium chloride flush (NS) 0.9 % injection 3 mL (3 mLs Intravenous Given 11/10/19 0532)  sodium chloride 0.9 % bolus 1,000 mL (0 mLs Intravenous Stopped 11/10/19 0711)  potassium chloride SA (KLOR-CON) CR tablet 40 mEq (40 mEq Oral Given 11/10/19 0704)    Mobility walks     Focused Assessments Cardiac Assessment  Handoff:  Cardiac Rhythm: Normal sinus rhythm Lab Results  Component Value Date   TROPONINI < 0.02 05/14/2012   No results found for: DDIMER Does the Patient currently have chest pain? No    R Recommendations: See Admitting Provider Note  Report given to:   Additional Notes: Patient alert and oriented tolerating PO intake.

## 2019-11-10 NOTE — ED Provider Notes (Signed)
[facility] EMERGENCY DEPARTMENT Provider Note   CSN: [id number] Arrival date & time: 11/10/19  1601     History Chief Complaint  Patient presents with  . Chest Pain    Adam [name]. is a 33 y.o. male.  HPI     This is a 33 year old male with a history of hypertension, alcohol withdrawal seizure who presents with possible seizure.  Patient reports he is trying to self detox from alcohol and Xanax.  He reports he had Xanax on the street.  His last Xanax was over 24 hours ago.  He also states that his last alcohol use was over 2 days ago.  He normally drinks at least 1/5 of liquor per day.  He has been doing this since the death of his daughter.  He was admitted in November for alcohol withdrawal seizure as well.  He states tonight he was playing with his children when his wife witnessed him falling to the ground and foaming at the mouth.  Patient does not recall the event.  She transported him emergently to the hospital.  Currently, patient is awake, alert and oriented.  He denies any recent fevers or cough.  He does state that upon awakening, he developed some anterior chest pain.  It is still currently present.  It is nonradiating.  I have reviewed his chart.  He was admitted for several days for presumed alcohol withdrawal seizures in November.  Past Medical History:  Diagnosis Date  . Eating disorder    history of bulemia  . HYPERTENSION 06/25/2010  . Hypertension     Patient Active Problem List   Diagnosis Date Noted  . Alcohol withdrawal ([place]) 09/24/2019  . Chest pain 09/24/2019  . Alcohol withdrawal seizure without complication ([place])   . Pneumothorax 04/28/2019  . Essential hypertension 06/25/2010    History reviewed. No pertinent surgical history.     Family History  Problem Relation Age of Onset  . Cancer Mother        lung  . Hypertension Father     Social History   Tobacco Use  . Smoking status: Current Every Day Smoker  .  Smokeless tobacco: Current User  Substance Use Topics  . Alcohol use: Yes  . Drug use: Not Currently    Home Medications Prior to Admission medications   Medication Sig Start Date End Date Taking? Authorizing Provider  lisinopril (ZESTRIL) 20 MG tablet Take 20 mg by mouth daily. 07/28/19  Yes [provider]  Multiple Vitamin (MULTIVITAMIN WITH MINERALS) TABS tablet Take 1 tablet by mouth daily. 09/27/19  Yes Hongalgi, Lenis Dickinson, MD  folic acid (FOLVITE) 1 MG tablet Take 1 tablet (1 mg total) by mouth daily. Patient not taking: Reported on 11/10/2019 09/27/19   Modena Jansky, MD  thiamine 100 MG tablet Take 1 tablet (100 mg total) by mouth daily. Patient not taking: Reported on 11/10/2019 09/27/19   Modena Jansky, MD    Allergies    Patient has no known allergies.  Review of Systems   Review of Systems  Constitutional: Negative for fever.  Respiratory: Negative for shortness of breath.   Cardiovascular: Positive for chest pain. Negative for leg swelling.  Gastrointestinal: Negative for abdominal pain, nausea and vomiting.  Neurological: Positive for seizures.  All other systems reviewed and are negative.   Physical Exam Updated Vital Signs BP (!) 172/99   Pulse 96   Temp 99 F (37.2 C) (Oral)   Resp 18  SpO2 97%   Physical Exam Vitals and nursing note reviewed.  Constitutional:      Appearance: He is well-developed. He is not ill-appearing.     Comments: Anxious but non-ill-appearing  HENT:     Head: Normocephalic and atraumatic.     Comments: Mucous membranes dry    Mouth/Throat:     Comments: No tongue lacerations present Eyes:     Pupils: Pupils are equal, round, and reactive to light.  Cardiovascular:     Rate and Rhythm: Regular rhythm. Tachycardia present.     Heart sounds: Normal heart sounds. No murmur.  Pulmonary:     Effort: Pulmonary effort is normal. No respiratory distress.     Breath sounds: Normal breath sounds. No wheezing.    Abdominal:     General: Bowel sounds are normal.     Palpations: Abdomen is soft.     Tenderness: There is no abdominal tenderness. There is no rebound.  Musculoskeletal:     Cervical back: Neck supple.     Right lower leg: No tenderness.     Left lower leg: No tenderness.  Lymphadenopathy:     Cervical: No cervical adenopathy.  Skin:    General: Skin is warm and dry.  Neurological:     General: No focal deficit present.     Mental Status: He is alert and oriented to person, place, and time.     Comments: Resting tremor noted  Psychiatric:        Mood and Affect: Mood is anxious.     ED Results / Procedures / Treatments   Labs (all labs ordered are listed, but only abnormal results are displayed) Labs Reviewed  BASIC METABOLIC PANEL - Abnormal; Notable for the following components:      Result Value   Sodium 132 (*)    Potassium 3.2 (*)    Chloride 95 (*)    Glucose, Bld 138 (*)    All other components within normal limits  CBC - Abnormal; Notable for the following components:   RDW 11.4 (*)    All other components within normal limits  RESPIRATORY PANEL BY RT PCR (FLU A&B, COVID)  RAPID URINE DRUG SCREEN, HOSP PERFORMED  ETHANOL  TROPONIN I (HIGH SENSITIVITY)  TROPONIN I (HIGH SENSITIVITY)    EKG EKG Interpretation  Date/Time:  Saturday November 10 2019 03:35:16 EST Ventricular Rate:  100 PR Interval:  160 QRS Duration: 94 QT Interval:  344 QTC Calculation: 443 R Axis:   57 Text Interpretation: Normal sinus rhythm T wave abnormality, consider inferior ischemia Abnormal ECG No significant change since last tracing Inverted T waves III aVF Confirmed by Ross MarcusHorton, Marston Mccadden ([id number]) on 11/10/2019 4:41:37 AM   Radiology DG Chest 2 View  Result Date: 11/10/2019 CLINICAL DATA:  Chest pain. EXAM: CHEST - 2 VIEW COMPARISON:  Radiograph and CT 09/24/2019 FINDINGS: The cardiomediastinal contours are normal. The lungs are clear. Pulmonary vasculature is normal. No  consolidation, pleural effusion, or pneumothorax. No acute osseous abnormalities are seen. IMPRESSION: Negative radiographs of the chest. Electronically Signed   By: Narda RutherfordMelanie  Sanford M.D.   On: 11/10/2019 04:13    Procedures Procedures (including critical care time)  CRITICAL CARE Performed by: Shon Batonourtney F Cristie Mckinney   Total critical care time: 45 minutes  Critical care time was exclusive of separately billable procedures and treating other patients.  Critical care was necessary to treat or prevent imminent or life-threatening deterioration.  Critical care was time spent personally by me on the following activities:  development of treatment plan with patient and/or surrogate as well as nursing, discussions with consultants, evaluation of patient's response to treatment, examination of patient, obtaining history from patient or surrogate, ordering and performing treatments and interventions, ordering and review of laboratory studies, ordering and review of radiographic studies, pulse oximetry and re-evaluation of patient's condition.   Medications Ordered in ED Medications  LORazepam (ATIVAN) injection 0-4 mg (2 mg Intravenous Given 11/10/19 0537)    Or  LORazepam (ATIVAN) tablet 0-4 mg ( Oral See Alternative 11/10/19 0537)  LORazepam (ATIVAN) injection 0-4 mg (has no administration in time range)    Or  LORazepam (ATIVAN) tablet 0-4 mg (has no administration in time range)  thiamine tablet 100 mg (has no administration in time range)    Or  thiamine (B-1) injection 100 mg (has no administration in time range)  sodium chloride flush (NS) 0.9 % injection 3 mL (3 mLs Intravenous Given 11/10/19 0532)  sodium chloride 0.9 % bolus 1,000 mL (1,000 mLs Intravenous New Bag/Given 11/10/19 0533)    ED Course  I have reviewed the triage vital signs and the nursing notes.  Pertinent labs & imaging results that were available during my care of the patient were reviewed by me and considered in my  medical decision making (see chart for details).  Clinical Course as of Nov 10 551  Sat Nov 10, 2019  0551 INitial CIWA 16   [CH]    Clinical Course User Index [CH] Sarahmarie Leavey, Mayer Masker, MD   MDM Rules/Calculators/A&P                      Patient presents with possible seizure.  Reports self detox from Xanax and alcohol.  History of alcohol withdrawal seizure.  He is overall nontoxic.  He is hypertensive.  He has a resting tremor on exam and does appear anxious.  Work-up initiated.  Initial CIWA score is 16.  He has slightly low sodium and potassium.  This will be replaced.  Alcohol level is pending.  Initial troponin is negative.  His chest pain is very atypical.  Given that he has high risk, he will need admission for detox.  Interestingly his UDS is without positive benzos.  Will discuss with admitting hospitalist.  Final Clinical Impression(s) / ED Diagnoses Final diagnoses:  Alcohol withdrawal seizure without complication (HCC)  Atypical chest pain    Rx / DC Orders ED Discharge Orders    None       Armella Stogner, Mayer Masker, MD 11/10/19 [id number]

## 2019-11-10 NOTE — ED Notes (Signed)
Pt advised that he is experiencing more chest tightness at this time.

## 2019-11-10 NOTE — Progress Notes (Signed)
EEG complete - results pending 

## 2019-11-11 DIAGNOSIS — F10230 Alcohol dependence with withdrawal, uncomplicated: Secondary | ICD-10-CM

## 2019-11-11 DIAGNOSIS — F13239 Sedative, hypnotic or anxiolytic dependence with withdrawal, unspecified: Secondary | ICD-10-CM

## 2019-11-11 LAB — BASIC METABOLIC PANEL
Anion gap: 7 (ref 5–15)
BUN: 11 mg/dL (ref 6–20)
CO2: 25 mmol/L (ref 22–32)
Calcium: 8.8 mg/dL — ABNORMAL LOW (ref 8.9–10.3)
Chloride: 105 mmol/L (ref 98–111)
Creatinine, Ser: 0.94 mg/dL (ref 0.61–1.24)
GFR calc Af Amer: >60 mL/min (ref >60)
GFR calc non Af Amer: >60 mL/min (ref >60)
Glucose, Bld: 92 mg/dL (ref 70–99)
Potassium: 4.6 mmol/L (ref 3.5–5.1)
Sodium: 137 mmol/L (ref 135–145)

## 2019-11-11 LAB — MAGNESIUM: Magnesium: 2.1 mg/dL (ref 1.7–2.4)

## 2019-11-11 NOTE — Progress Notes (Signed)
Patient ID: Adam Peoples Creasman Montez Hageman., male   DOB: 31-Jul-1986, 31 y.o.   MRN: [id number]  PROGRESS NOTE    Adam Peoples Lattanzio Jr.  ZWC:[id number] DOB: 1986-08-08 DOA: 11/10/2019 PCP: Patient, No Pcp Per   Brief Narrative:  33 year old male with history of hypertension, alcohol dependence, alcohol withdrawal seizure presented after having a seizure at home.  He had stopped drinking alcohol 2 days prior to presentation in an attempt to self detox.  In the ED, he was hypertensive but afebrile with no leukocytosis.  UDS was negative.  He was admitted for alcohol withdrawal and started on CIWA protocol.  Assessment & Plan:   Alcohol withdrawal seizure Alcohol abuse with withdrawal -Patient was admitted in November 2020 with alcohol withdrawal seizure as well.  Present second this time with probable alcohol withdrawal seizure. -Currently on CIWA protocol.  Decrease Librium to 25 mg every 8 hours.  Use Ativan as needed continue multivitamin, thiamine and folate. -No further seizures since admission.  CT head was negative for any acute abnormality.  EEG was negative for any seizures.  I discussed with neurology/Dr. Aroor on 11/10/2019 on phone and he recommended that if the EEG was negative and if this is alcohol withdrawal seizure, patient would not need any antiepileptics -Outpatient follow-up with neurology. -Seizure precautions  Possible acute benzodiazepine withdrawal -Patient does not have a prescription for Xanax but apparently he takes medication from someone he knows.  Patient apparently was taking a lot of Xanax recently and has been trying to cut down recently.  Continue Ativan and Librium as well.  Hypokalemia -Improved  Atypical chest pain -Troponin negative.  Chest pain has resolved.  No further work-up needed.  Hypertension next-monitor blood pressure.  Continue lisinopril.  Depression/bereavement -Patient reported having a personal tragedy but did not elaborate.  From chart  review, his 38-year-old daughter passed away in a MVA.  Since then he has been drinking heavily and using benzodiazepines.  -Psychiatry evaluation appreciated.  He has been started on trazodone.   DVT prophylaxis: SCDs Code Status: Full Family Communication: Spoke to patient at bedside Disposition Plan: Home in 1 to 2 days if clinically improves  Consultants: Psychiatry  Procedures: None  Antimicrobials: None   Subjective: Patient seen and examined at bedside.  He is sleepy, wakes up on calling his name.  Poor historian.  No overnight fever, nausea, vomiting or seizures reported.  Objective: Vitals:   11/10/19 1935 11/10/19 2320 11/11/19 0312 11/11/19 0650  BP: (!) 138/93 128/80 (!) 96/56 99/63  Pulse: 93 75 65 66  Resp: 14 18 18 16   Temp: 99.4 F (37.4 C) 98.5 F (36.9 C) 98.3 F (36.8 C) (!) 97.4 F (36.3 C)  TempSrc: Oral Oral Oral Oral  SpO2: 95% 98% 97% 98%  Weight:      Height:        Intake/Output Summary (Last 24 hours) at 11/11/2019 0954 Last data filed at 11/11/2019 0000 Gross per 24 hour  Intake 1420 ml  Output 600 ml  Net 820 ml   Filed Weights   11/10/19 0900  Weight: 77.1 kg    Examination:  General exam: Appears calm and comfortable.  No acute distress Respiratory system: Bilateral decreased breath sounds at bases Cardiovascular system: S1 & S2 heard, Rate controlled Gastrointestinal system: Abdomen is nondistended, soft and nontender. Normal bowel sounds heard. Extremities: No cyanosis, clubbing, edema  Central nervous system: Sleepy, wakes up slightly, poor historian. No focal neurological deficits. Moving extremities. Skin: No rashes, lesions or  ulcers Psychiatry: Could not be assessed because of mental status    Data Reviewed: I have personally reviewed following labs and imaging studies  CBC: Recent Labs  Lab 11/06/19 1155 11/10/19 0356  WBC 3.3* 9.8  HGB 17.7* 15.9  HCT 49.6 45.0  MCV 92.0 93.6  PLT 214 242   Basic  Metabolic Panel: Recent Labs  Lab 11/06/19 1155 11/10/19 0356 11/10/19 0546 11/11/19 0231  NA 136 132* 134* 137  K 4.0 3.2* 3.2* 4.6  CL 98 95* 98 105  CO2 26 25 25 25   GLUCOSE 102* 138* 109* 92  BUN 12 9 9 11   CREATININE 1.01 1.08 1.03 0.94  CALCIUM 8.9 9.1 8.6* 8.8*  MG  --   --  1.8 2.1  PHOS  --   --  2.4*  --    GFR: Estimated Creatinine Clearance: 111.3 mL/min (by C-G formula based on SCr of 0.94 mg/dL). Liver Function Tests: Recent Labs  Lab 11/06/19 1155 11/10/19 0546  AST 40 54*  ALT 28 35  ALKPHOS 55 45  BILITOT 1.2 1.2  PROT 7.4 6.3*  ALBUMIN 4.4 3.9   No results for input(s): LIPASE, AMYLASE in the last 168 hours. No results for input(s): AMMONIA in the last 168 hours. Coagulation Profile: No results for input(s): INR, PROTIME in the last 168 hours. Cardiac Enzymes: No results for input(s): CKTOTAL, CKMB, CKMBINDEX, TROPONINI in the last 168 hours. BNP (last 3 results) No results for input(s): PROBNP in the last 8760 hours. HbA1C: No results for input(s): HGBA1C in the last 72 hours. CBG: No results for input(s): GLUCAP in the last 168 hours. Lipid Profile: No results for input(s): CHOL, HDL, LDLCALC, TRIG, CHOLHDL, LDLDIRECT in the last 72 hours. Thyroid Function Tests: No results for input(s): TSH, T4TOTAL, FREET4, T3FREE, THYROIDAB in the last 72 hours. Anemia Panel: No results for input(s): VITAMINB12, FOLATE, FERRITIN, TIBC, IRON, RETICCTPCT in the last 72 hours. Sepsis Labs: No results for input(s): PROCALCITON, LATICACIDVEN in the last 168 hours.  Recent Results (from the past 240 hour(s))  Respiratory Panel by RT PCR (Flu A&B, Covid) - Nasopharyngeal Swab     Status: None   Collection Time: 11/10/19  5:39 AM   Specimen: Nasopharyngeal Swab  Result Value Ref Range Status   SARS Coronavirus 2 by RT PCR NEGATIVE NEGATIVE Final    Comment: (NOTE) SARS-CoV-2 target nucleic acids are NOT DETECTED. The SARS-CoV-2 RNA is generally detectable in  upper respiratoy specimens during the acute phase of infection. The lowest concentration of SARS-CoV-2 viral copies this assay can detect is 131 copies/mL. A negative result does not preclude SARS-Cov-2 infection and should not be used as the sole basis for treatment or other patient management decisions. A negative result may occur with  improper specimen collection/handling, submission of specimen other than nasopharyngeal swab, presence of viral mutation(s) within the areas targeted by this assay, and inadequate number of viral copies (<131 copies/mL). A negative result must be combined with clinical observations, patient history, and epidemiological information. The expected result is Negative. Fact Sheet for Patients:  https://www.moore.com/https://www.fda.gov/media/142436/download Fact Sheet for Healthcare Providers:  https://www.young.biz/https://www.fda.gov/media/142435/download This test is not yet ap proved or cleared by the [place] FDA and  has been authorized for detection and/or diagnosis of SARS-CoV-2 by FDA under an Emergency Use Authorization (EUA). This EUA will remain  in effect (meaning this test can be used) for the duration of the COVID-19 declaration under Section 564(b)(1) of the Act, 21 U.S.C. section 360bbb-3(b)(1), unless  the authorization is terminated or revoked sooner.    Influenza A by PCR NEGATIVE NEGATIVE Final   Influenza B by PCR NEGATIVE NEGATIVE Final    Comment: (NOTE) The Xpert Xpress SARS-CoV-2/FLU/RSV assay is intended as an aid in  the diagnosis of influenza from Nasopharyngeal swab specimens and  should not be used as a sole basis for treatment. Nasal washings and  aspirates are unacceptable for Xpert Xpress SARS-CoV-2/FLU/RSV  testing. Fact Sheet for Patients: PinkCheek.be Fact Sheet for Healthcare Providers: GravelBags.it This test is not yet approved or cleared by the [place] FDA and  has been authorized for  detection and/or diagnosis of SARS-CoV-2 by  FDA under an Emergency Use Authorization (EUA). This EUA will remain  in effect (meaning this test can be used) for the duration of the  Covid-19 declaration under Section 564(b)(1) of the Act, 21  U.S.C. section 360bbb-3(b)(1), unless the authorization is  terminated or revoked. Performed at [facility], [place] [address] [place], [place] [address]   MRSA PCR Screening     Status: None   Collection Time: 11/10/19  8:18 AM   Specimen: Nasal Mucosa; Nasopharyngeal  Result Value Ref Range Status   MRSA by PCR NEGATIVE NEGATIVE Final    Comment:        The GeneXpert MRSA Assay (FDA approved for NASAL specimens only), is one component of a comprehensive MRSA colonization surveillance program. It is not intended to diagnose MRSA infection nor to guide or monitor treatment for MRSA infections. Performed at [facility], [place] [address] [place], [place] [address]          Radiology Studies: DG Chest 2 View  Result Date: 11/10/2019 CLINICAL DATA:  Chest pain. EXAM: CHEST - 2 VIEW COMPARISON:  Radiograph and CT 09/24/2019 FINDINGS: The cardiomediastinal contours are normal. The lungs are clear. Pulmonary vasculature is normal. No consolidation, pleural effusion, or pneumothorax. No acute osseous abnormalities are seen. IMPRESSION: Negative radiographs of the chest. Electronically Signed   By: Keith Rake M.D.   On: 11/10/2019 04:13   CT HEAD WO CONTRAST  Result Date: 11/10/2019 CLINICAL DATA:  Encephalopathy. Headache, head trauma. Seizure at home, history of seizures. EXAM: CT HEAD WITHOUT CONTRAST TECHNIQUE: Contiguous axial images were obtained from the base of the skull through the vertex without intravenous contrast. COMPARISON:  Head CT 09/23/2019 FINDINGS: Brain: No intracranial hemorrhage, mass effect, or midline shift. Mild atrophy for age. No hydrocephalus. The basilar cisterns are patent. No evidence of  territorial infarct or acute ischemia. No extra-axial or intracranial fluid collection. Vascular: No hyperdense vessel or unexpected calcification. Skull: No fracture or focal lesion. Sinuses/Orbits: Paranasal sinuses and mastoid air cells are clear. The visualized orbits are unremarkable. Other: None. IMPRESSION: 1.  No acute intracranial abnormality. 2. Mild atrophy for age. Electronically Signed   By: Keith Rake M.D.   On: 11/10/2019 07:00   EEG adult  Result Date: 11/10/2019 Alexis Goodell, MD     11/10/2019  1:47 PM ELECTROENCEPHALOGRAM REPORT Patient: Adam Corsino [facility].       Room #: 2C05C EEG No. ID: [id number] Age: 33 y.o.        Sex: male Referring Physician: Starla Link Report Date:  11/10/2019       Interpreting Physician: Alexis Goodell History: Renae Gloss Dowell [name] is an 33 y.o. male with seizure like activity Medications: Librium, Zestril, Desyrel Conditions of Recording:  This is a 21 channel routine scalp EEG performed with bipolar and monopolar  montages arranged in accordance to the international 10/20 system of electrode placement. One channel was dedicated to EKG recording. The patient is in the awake, drowsy and asleep states. Description:  The waking background activity consists of a low voltage, symmetrical, fairly well organized, 9-10 Hz alpha activity, seen from the parieto-occipital and posterior temporal regions.  Low voltage fast activity, poorly organized, is seen anteriorly and is at times superimposed on more posterior regions.  A mixture of theta and alpha rhythms are seen from the central and temporal regions. The patient drowses with slowing to irregular, low voltage theta and beta activity.  The patient goes in to a light sleep with symmetrical sleep spindles, vertex central sharp transients and irregular slow activity. No epileptiform activity is noted.  Hyperventilation was not performed.  Intermittent photic stimulation was performed and elicits a symmetrical  driving response but fails to elicit any abnormalities. IMPRESSION: Normal electroencephalogram, awake, asleep and with activation procedures. There are no focal lateralizing or epileptiform features. Thana Farr, MD Neurology [phone] 11/10/2019, 1:46 PM        Scheduled Meds: . chlordiazePOXIDE  25 mg Oral Q6H  . folic acid  1 mg Oral Daily  . influenza vac split quadrivalent PF  0.5 mL Intramuscular Tomorrow-1000  . lisinopril  20 mg Oral Daily  . multivitamin with minerals  1 tablet Oral Daily  . thiamine  100 mg Oral Daily   Or  . thiamine  100 mg Intravenous Daily  . traZODone  100 mg Oral QHS   Continuous Infusions:        Glade Lloyd, MD [facility] 11/11/2019, 9:54 AM

## 2019-11-12 LAB — CBC WITH DIFFERENTIAL/PLATELET
Abs Immature Granulocytes: 0.01 10*3/uL (ref 0.00–0.07)
Basophils Absolute: 0 10*3/uL (ref 0.0–0.1)
Basophils Relative: 0 %
Eosinophils Absolute: 0 10*3/uL (ref 0.0–0.5)
Eosinophils Relative: 1 %
HCT: 42.5 % (ref 39.0–52.0)
Hemoglobin: 14.7 g/dL (ref 13.0–17.0)
Immature Granulocytes: 0 %
Lymphocytes Relative: 38 %
Lymphs Abs: 1.8 10*3/uL (ref 0.7–4.0)
MCH: 33.3 pg (ref 26.0–34.0)
MCHC: 34.6 g/dL (ref 30.0–36.0)
MCV: 96.4 fL (ref 80.0–100.0)
Monocytes Absolute: 0.4 10*3/uL (ref 0.1–1.0)
Monocytes Relative: 8 %
Neutro Abs: 2.5 10*3/uL (ref 1.7–7.7)
Neutrophils Relative %: 53 %
Platelets: 190 10*3/uL (ref 150–400)
RBC: 4.41 MIL/uL (ref 4.22–5.81)
RDW: 11.5 % (ref 11.5–15.5)
WBC: 4.8 10*3/uL (ref 4.0–10.5)
nRBC: 0 % (ref 0.0–0.2)

## 2019-11-12 LAB — BASIC METABOLIC PANEL
Anion gap: 8 (ref 5–15)
BUN: 14 mg/dL (ref 6–20)
CO2: 27 mmol/L (ref 22–32)
Calcium: 8.8 mg/dL — ABNORMAL LOW (ref 8.9–10.3)
Chloride: 103 mmol/L (ref 98–111)
Creatinine, Ser: 1.2 mg/dL (ref 0.61–1.24)
GFR calc Af Amer: >60 mL/min (ref >60)
GFR calc non Af Amer: >60 mL/min (ref >60)
Glucose, Bld: 94 mg/dL (ref 70–99)
Potassium: 4 mmol/L (ref 3.5–5.1)
Sodium: 138 mmol/L (ref 135–145)

## 2019-11-12 LAB — MAGNESIUM: Magnesium: 2.3 mg/dL (ref 1.7–2.4)

## 2019-11-12 MED ORDER — CHLORDIAZEPOXIDE HCL 25 MG PO CAPS
25.0000 mg | ORAL_CAPSULE | Freq: Three times a day (TID) | ORAL | Status: DC
  Administered 2019-11-12 – 2019-11-13 (×3): 25 mg via ORAL
  Filled 2019-11-12 (×3): qty 1

## 2019-11-12 NOTE — Progress Notes (Signed)
Patient ID: Adam PeoplesStephen Dean Quirino Montez HagemanJr., male   DOB: Dec 23, 1985, 33 y.o.   MRN: [id number]  PROGRESS NOTE    Adam PeoplesStephen Dean Clendenning Jr.  NFA:[id number] DOB: Dec 23, 1985 DOA: 11/10/2019 PCP: Patient, No Pcp Per   Brief Narrative:  33 year old male with history of hypertension, alcohol dependence, alcohol withdrawal seizure presented after having a seizure at home.  He had stopped drinking alcohol 2 days prior to presentation in an attempt to self detox.  In the ED, he was hypertensive but afebrile with no leukocytosis.  UDS was negative.  He was admitted for alcohol withdrawal and started on CIWA protocol.  Assessment & Plan:   Alcohol withdrawal seizure Alcohol abuse with withdrawal -Patient was admitted in November 2020 with alcohol withdrawal seizure as well.  Present second this time with probable alcohol withdrawal seizure. -Currently on CIWA protocol.  Continue Librium 25 mg every 8 hours and probably switch to every 12 hours tomorrow..  Use Ativan as needed continue multivitamin, thiamine and folate. -No further seizures since admission.  CT head was negative for any acute abnormality.  EEG was negative for any seizures.  I discussed with neurology/Dr. Aroor on 11/10/2019 on phone and he recommended that if the EEG was negative and if this is alcohol withdrawal seizure, patient would not need any antiepileptics -Outpatient follow-up with neurology. -Seizure precautions  Possible acute benzodiazepine withdrawal -Patient does not have a prescription for Xanax but apparently he takes medication from someone he knows.  Patient apparently was taking a lot of Xanax recently and has been trying to cut down recently.  Continue Ativan and Librium as well.  Hypokalemia -Improved  Atypical chest pain -Troponin negative.  Chest pain has resolved.  No further work-up needed.  Hypertension -monitor blood pressure.  Continue lisinopril.  Depression/bereavement -Patient reported having a personal  tragedy but did not elaborate.  From chart review, his 540-year-old daughter passed away in a MVA.  Since then he has been drinking heavily and using benzodiazepines.  -Psychiatry evaluation appreciated.  He has been started on trazodone.   DVT prophylaxis: SCDs Code Status: Full Family Communication: Spoke to patient at bedside Disposition Plan: Home in 1 to 2 days if clinically improves  Consultants: Psychiatry  Procedures: None  Antimicrobials: None   Subjective: Patient seen and examined at bedside.  Sleeping, wakes up on calling his name.  States that he is feeling slightly better but is still jittery at times and is not ready for discharge today yet.  No overnight fever or vomiting reported.  Objective: Vitals:   11/11/19 1200 11/11/19 1644 11/11/19 2000 11/12/19 0930  BP: 122/75 117/76 113/76 (!) 107/59  Pulse: 88 68 79   Resp:  20 16   Temp:  99.5 F (37.5 C) 98.8 F (37.1 C)   TempSrc:  Oral Oral   SpO2:  97% 98%   Weight:      Height:        Intake/Output Summary (Last 24 hours) at 11/12/2019 0934 Last data filed at 11/12/2019 0600 Gross per 24 hour  Intake 1200 ml  Output 600 ml  Net 600 ml   Filed Weights   11/10/19 0900  Weight: 77.1 kg    Examination:  General exam: No distress.  Sleepy, wakes up slightly, poor historian. Respiratory system: Bilateral decreased breath sounds at bases, some scattered crackles Cardiovascular system: Rate controlled, S1-2 heard Gastrointestinal system: Abdomen is nondistended, soft and nontender. Normal bowel sounds heard. Extremities: No cyanosis or edema   Data Reviewed: I  have personally reviewed following labs and imaging studies  CBC: Recent Labs  Lab 11/06/19 1155 11/10/19 0356 11/12/19 0243  WBC 3.3* 9.8 4.8  NEUTROABS  --   --  2.5  HGB 17.7* 15.9 14.7  HCT 49.6 45.0 42.5  MCV 92.0 93.6 96.4  PLT 214 242 190   Basic Metabolic Panel: Recent Labs  Lab 11/06/19 1155 11/10/19 0356 11/10/19 0546  11/11/19 0231 11/12/19 0243  NA 136 132* 134* 137 138  K 4.0 3.2* 3.2* 4.6 4.0  CL 98 95* 98 105 103  CO2 26 25 25 25 27   GLUCOSE 102* 138* 109* 92 94  BUN 12 9 9 11 14   CREATININE 1.01 1.08 1.03 0.94 1.20  CALCIUM 8.9 9.1 8.6* 8.8* 8.8*  MG  --   --  1.8 2.1 2.3  PHOS  --   --  2.4*  --   --    GFR: Estimated Creatinine Clearance: 87.2 mL/min (by C-G formula based on SCr of 1.2 mg/dL). Liver Function Tests: Recent Labs  Lab 11/06/19 1155 11/10/19 0546  AST 40 54*  ALT 28 35  ALKPHOS 55 45  BILITOT 1.2 1.2  PROT 7.4 6.3*  ALBUMIN 4.4 3.9   No results for input(s): LIPASE, AMYLASE in the last 168 hours. No results for input(s): AMMONIA in the last 168 hours. Coagulation Profile: No results for input(s): INR, PROTIME in the last 168 hours. Cardiac Enzymes: No results for input(s): CKTOTAL, CKMB, CKMBINDEX, TROPONINI in the last 168 hours. BNP (last 3 results) No results for input(s): PROBNP in the last 8760 hours. HbA1C: No results for input(s): HGBA1C in the last 72 hours. CBG: No results for input(s): GLUCAP in the last 168 hours. Lipid Profile: No results for input(s): CHOL, HDL, LDLCALC, TRIG, CHOLHDL, LDLDIRECT in the last 72 hours. Thyroid Function Tests: No results for input(s): TSH, T4TOTAL, FREET4, T3FREE, THYROIDAB in the last 72 hours. Anemia Panel: No results for input(s): VITAMINB12, FOLATE, FERRITIN, TIBC, IRON, RETICCTPCT in the last 72 hours. Sepsis Labs: No results for input(s): PROCALCITON, LATICACIDVEN in the last 168 hours.  Recent Results (from the past 240 hour(s))  Respiratory Panel by RT PCR (Flu A&B, Covid) - Nasopharyngeal Swab     Status: None   Collection Time: 11/10/19  5:39 AM   Specimen: Nasopharyngeal Swab  Result Value Ref Range Status   SARS Coronavirus 2 by RT PCR NEGATIVE NEGATIVE Final    Comment: (NOTE) SARS-CoV-2 target nucleic acids are NOT DETECTED. The SARS-CoV-2 RNA is generally detectable in upper respiratoy specimens  during the acute phase of infection. The lowest concentration of SARS-CoV-2 viral copies this assay can detect is 131 copies/mL. A negative result does not preclude SARS-Cov-2 infection and should not be used as the sole basis for treatment or other patient management decisions. A negative result may occur with  improper specimen collection/handling, submission of specimen other than nasopharyngeal swab, presence of viral mutation(s) within the areas targeted by this assay, and inadequate number of viral copies (<131 copies/mL). A negative result must be combined with clinical observations, patient history, and epidemiological information. The expected result is Negative. Fact Sheet for Patients:  11/12/19 Fact Sheet for Healthcare Providers:  11/12/19 This test is not yet ap proved or cleared by the https://www.moore.com/ FDA and  has been authorized for detection and/or diagnosis of SARS-CoV-2 by FDA under an Emergency Use Authorization (EUA). This EUA will remain  in effect (meaning this test can be used) for the duration of  the COVID-19 declaration under Section 564(b)(1) of the Act, 21 U.S.C. section 360bbb-3(b)(1), unless the authorization is terminated or revoked sooner.    Influenza A by PCR NEGATIVE NEGATIVE Final   Influenza B by PCR NEGATIVE NEGATIVE Final    Comment: (NOTE) The Xpert Xpress SARS-CoV-2/FLU/RSV assay is intended as an aid in  the diagnosis of influenza from Nasopharyngeal swab specimens and  should not be used as a sole basis for treatment. Nasal washings and  aspirates are unacceptable for Xpert Xpress SARS-CoV-2/FLU/RSV  testing. Fact Sheet for Patients: PinkCheek.be Fact Sheet for Healthcare Providers: GravelBags.it This test is not yet approved or cleared by the [place] FDA and  has been authorized for detection and/or diagnosis of  SARS-CoV-2 by  FDA under an Emergency Use Authorization (EUA). This EUA will remain  in effect (meaning this test can be used) for the duration of the  Covid-19 declaration under Section 564(b)(1) of the Act, 21  U.S.C. section 360bbb-3(b)(1), unless the authorization is  terminated or revoked. Performed at [facility], [place] [address], [place], [place] [address]   MRSA PCR Screening     Status: None   Collection Time: 18-Nov-2019  8:18 AM   Specimen: Nasal Mucosa; Nasopharyngeal  Result Value Ref Range Status   MRSA by PCR NEGATIVE NEGATIVE Final    Comment:        The GeneXpert MRSA Assay (FDA approved for NASAL specimens only), is one component of a comprehensive MRSA colonization surveillance program. It is not intended to diagnose MRSA infection nor to guide or monitor treatment for MRSA infections. Performed at [facility], [place] [address] [place], [place] [address]          Radiology Studies: EEG adult  Result Date: 18-Nov-2019 Alexis Goodell, MD     11/18/19  1:47 PM ELECTROENCEPHALOGRAM REPORT Patient: Daysen Gundrum Balcerzak Jr.       Room #: 2C05C EEG No. ID: [id number] Age: 33 y.o.        Sex: male Referring Physician: Starla Link Report Date:  11/18/19       Interpreting Physician: Alexis Goodell History: Renae Gloss Laye [name] is an 33 y.o. male with seizure like activity Medications: Librium, Zestril, Desyrel Conditions of Recording:  This is a 21 channel routine scalp EEG performed with bipolar and monopolar montages arranged in accordance to the international 10/20 system of electrode placement. One channel was dedicated to EKG recording. The patient is in the awake, drowsy and asleep states. Description:  The waking background activity consists of a low voltage, symmetrical, fairly well organized, 9-10 Hz alpha activity, seen from the parieto-occipital and posterior temporal regions.  Low voltage fast activity, poorly organized, is seen anteriorly and is  at times superimposed on more posterior regions.  A mixture of theta and alpha rhythms are seen from the central and temporal regions. The patient drowses with slowing to irregular, low voltage theta and beta activity.  The patient goes in to a light sleep with symmetrical sleep spindles, vertex central sharp transients and irregular slow activity. No epileptiform activity is noted.  Hyperventilation was not performed.  Intermittent photic stimulation was performed and elicits a symmetrical driving response but fails to elicit any abnormalities. IMPRESSION: Normal electroencephalogram, awake, asleep and with activation procedures. There are no focal lateralizing or epileptiform features. Alexis Goodell, MD Neurology [phone] Nov 18, 2019, 1:46 PM        Scheduled Meds: . chlordiazePOXIDE  25 mg Oral K8L  . folic acid  1 mg Oral Daily  . influenza vac split quadrivalent PF  0.5 mL Intramuscular Tomorrow-1000  . lisinopril  20 mg Oral Daily  . multivitamin with minerals  1 tablet Oral Daily  . thiamine  100 mg Oral Daily   Or  . thiamine  100 mg Intravenous Daily  . traZODone  100 mg Oral QHS   Continuous Infusions:        Glade Lloyd, MD [facility] 11/12/2019, 9:34 AM

## 2019-11-12 NOTE — Plan of Care (Signed)
  Problem: Health Behavior/Discharge Planning: Goal: Ability to manage health-related needs will improve Outcome: Progressing   Problem: Coping: Goal: Level of anxiety will decrease Outcome: Progressing   Problem: Safety: Goal: Ability to remain free from injury will improve Outcome: Progressing    Patient has had no complaints of nausea or vomiting. Patient has been in bed resting. RN encouraged patient to move around and walk. Patient has ambulated around the room and to the bathroom.

## 2019-11-13 DIAGNOSIS — F4321 Adjustment disorder with depressed mood: Secondary | ICD-10-CM

## 2019-11-13 MED ORDER — THIAMINE HCL 100 MG PO TABS: 100.0000 mg | ORAL_TABLET | Freq: Every day | ORAL | 0 refills | Status: DC

## 2019-11-13 MED ORDER — LISINOPRIL 20 MG PO TABS: 20.0000 mg | ORAL_TABLET | Freq: Every day | ORAL | 0 refills | Status: DC

## 2019-11-13 MED ORDER — FOLIC ACID 1 MG PO TABS: 1.0000 mg | ORAL_TABLET | Freq: Every day | ORAL | 0 refills | Status: DC

## 2019-11-13 MED ORDER — TRAZODONE HCL 100 MG PO TABS: 100.0000 mg | ORAL_TABLET | Freq: Every day | ORAL | 0 refills | Status: DC

## 2019-11-13 MED ORDER — CHLORDIAZEPOXIDE HCL 25 MG PO CAPS: ORAL_CAPSULE | ORAL | 0 refills | Status: DC

## 2019-11-13 MED ORDER — CHLORDIAZEPOXIDE HCL 25 MG PO CAPS: 25.0000 mg | ORAL_CAPSULE | Freq: Two times a day (BID) | ORAL | Status: DC

## 2019-11-13 MED FILL — traZODone HCL 100 MG TABS: 100 | 10 days supply | Qty: 10 | Fill #0

## 2019-11-13 MED FILL — LISINOPRIL 20 MG TABLET: 20 | 30 days supply | Qty: 30 | Fill #0

## 2019-11-13 MED FILL — FOLIC ACID 1 MG TABS: 1 | 30 days supply | Qty: 30 | Fill #0

## 2019-11-13 MED FILL — CHLORDIAZEPOXIDE 25 MG CAP: 25 | 4 days supply | Qty: 6 | Fill #0

## 2019-11-13 MED FILL — VITAMIN B-1 100 MG TABS: 100 | 30 days supply | Qty: 30 | Fill #0

## 2019-11-13 NOTE — Discharge Summary (Signed)
Physician Discharge Summary  Adam PeoplesStephen Dean Schram Jr. EAV:[id number] DOB: [birth year] DOA: 11/10/2019  PCP: Patient, No Pcp Per  Admit date: 11/10/2019 Discharge date: 11/13/2019  Admitted From: Home Disposition: Home  Recommendations for Outpatient Follow-up:  1. Follow up with PCP in 1 week with repeat CBC/BMP 2. Outpatient follow-up with psychiatry 3. Abstain from alcohol 4. Follow up in ED if symptoms worsen or new appear 5. Patient should not be driving until cleared by his PCP and/or neurology.   Home Health: No Equipment/Devices: None  Discharge Condition: Stable CODE STATUS: Full Diet recommendation: Heart healthy  Brief/Interim Summary: 33 year old male with history of hypertension, alcohol dependence, alcohol withdrawal seizure presented after having a seizure at home.  He had stopped drinking alcohol 2 days prior to presentation in an attempt to self detox.  In the ED, he was hypertensive but afebrile with no leukocytosis.  UDS was negative.  He was admitted for alcohol withdrawal and started on CIWA protocol.  During the hospitalization, he was evaluated by psychiatry and placed on trazodone.  His symptoms have improved.  He will be discharged on tapering doses of Librium.  He will need to abstain from alcohol.  Discharge Diagnoses:   Alcohol withdrawal seizure Alcohol abuse with withdrawal -Patient was admitted in November 2020 with alcohol withdrawal seizure as well.  Present second this time with probable alcohol withdrawal seizure. -Currently on CIWA protocol.    Currently on tapering doses of Librium.  -No further seizures since admission.  CT head was negative for any acute abnormality.  EEG was negative for any seizures.  I discussed with neurology/Dr. Aroor on 11/10/2019 on phone and he recommended that if the EEG was negative and if this is alcohol withdrawal seizure, patient would not need any antiepileptics -Outpatient follow-up with neurology. -Seizure  precautions -Condition has improved.  Will discharge on tapering doses of Librium: 25 mg twice a day for 2 days then 25 mg once a day for 2 days then stop. -He should not be driving till cleared by neurology and/or PCP  Possible acute benzodiazepine withdrawal -Patient does not have a prescription for Xanax but apparently he takes medication from someone he knows.  Patient apparently was taking a lot of Xanax recently and has been trying to cut down recently.    Librium plasma as above.  Outpatient follow-up with PCP  Hypokalemia -Improved  Atypical chest pain -Troponin negative.  Chest pain has resolved.  No further work-up needed.  Hypertension -monitor blood pressure.  Continue lisinopril.  Outpatient follow-up  Depression/bereavement -Patient reported having a personal tragedy but did not elaborate. From chart review, his 31-year-old daughter passed away in a MVA. Since then he has been drinking heavily and using benzodiazepines.  -Psychiatry evaluation appreciated.  He has been started on trazodone.  Trazodone will be continued on discharge.  He will need to follow-up with psychiatry as an outpatient for continuation of the same.  Discharge Instructions  Discharge Instructions    Ambulatory referral to Neurology   Complete by: As directed    An appointment is requested in approximately: few weeks for recurrent alcohol withdrawal seizure   Ambulatory referral to Psychiatry   Complete by: As directed    Diet - low sodium heart healthy   Complete by: As directed    Increase activity slowly   Complete by: As directed      Allergies as of 11/13/2019   No Known Allergies     Medication List    TAKE these medications  chlordiazePOXIDE 25 MG capsule Commonly known as: LIBRIUM 25 mg twice a day for 2 days then 25 mg daily for 2 days then stop   folic acid 1 MG tablet Commonly known as: FOLVITE Take 1 tablet (1 mg total) by mouth daily.   lisinopril 20 MG  tablet Commonly known as: ZESTRIL Take 1 tablet (20 mg total) by mouth daily.   multivitamin with minerals Tabs tablet Take 1 tablet by mouth daily.   thiamine 100 MG tablet Take 1 tablet (100 mg total) by mouth daily.   traZODone 100 MG tablet Commonly known as: DESYREL Take 1 tablet (100 mg total) by mouth at bedtime.      Follow-up Information    [facility] Follow up on 12/06/2019.   Specialty: Family Medicine Why: 9:30 for follow up apt Contact information: [address] [facility] [id number] [phone]         No Known Allergies  Consultations:  Psychiatry   Procedures/Studies: DG Chest 2 View  Result Date: 11/10/2019 CLINICAL DATA:  Chest pain. EXAM: CHEST - 2 VIEW COMPARISON:  Radiograph and CT 09/24/2019 FINDINGS: The cardiomediastinal contours are normal. The lungs are clear. Pulmonary vasculature is normal. No consolidation, pleural effusion, or pneumothorax. No acute osseous abnormalities are seen. IMPRESSION: Negative radiographs of the chest. Electronically Signed   By: Narda Rutherford M.D.   On: 11/10/2019 04:13   CT HEAD WO CONTRAST  Result Date: 11/10/2019 CLINICAL DATA:  Encephalopathy. Headache, head trauma. Seizure at home, history of seizures. EXAM: CT HEAD WITHOUT CONTRAST TECHNIQUE: Contiguous axial images were obtained from the base of the skull through the vertex without intravenous contrast. COMPARISON:  Head CT 09/23/2019 FINDINGS: Brain: No intracranial hemorrhage, mass effect, or midline shift. Mild atrophy for age. No hydrocephalus. The basilar cisterns are patent. No evidence of territorial infarct or acute ischemia. No extra-axial or intracranial fluid collection. Vascular: No hyperdense vessel or unexpected calcification. Skull: No fracture or focal lesion. Sinuses/Orbits: Paranasal sinuses and mastoid air cells are clear. The visualized orbits are unremarkable. Other: None. IMPRESSION: 1.  No  acute intracranial abnormality. 2. Mild atrophy for age. Electronically Signed   By: Narda Rutherford M.D.   On: 11/10/2019 07:00   EEG adult  Result Date: 11/10/2019 Thana Farr, MD     11/10/2019  1:47 PM ELECTROENCEPHALOGRAM REPORT Patient: [facility].       Room #: 2C05C EEG No. ID: [id number] Age: 33 y.o.        Sex: male Referring Physician: Hanley Ben Report Date:  11/10/2019       Interpreting Physician: Thana Farr History: Adam [name] Vera Montez Hageman. is an 33 y.o. male with seizure like activity Medications: Librium, Zestril, Desyrel Conditions of Recording:  This is a 21 channel routine scalp EEG performed with bipolar and monopolar montages arranged in accordance to the international 10/20 system of electrode placement. One channel was dedicated to EKG recording. The patient is in the awake, drowsy and asleep states. Description:  The waking background activity consists of a low voltage, symmetrical, fairly well organized, 9-10 Hz alpha activity, seen from the parieto-occipital and posterior temporal regions.  Low voltage fast activity, poorly organized, is seen anteriorly and is at times superimposed on more posterior regions.  A mixture of theta and alpha rhythms are seen from the central and temporal regions. The patient drowses with slowing to irregular, low voltage theta and beta activity.  The patient goes in to a light sleep  with symmetrical sleep spindles, vertex central sharp transients and irregular slow activity. No epileptiform activity is noted.  Hyperventilation was not performed.  Intermittent photic stimulation was performed and elicits a symmetrical driving response but fails to elicit any abnormalities. IMPRESSION: Normal electroencephalogram, awake, asleep and with activation procedures. There are no focal lateralizing or epileptiform features. Thana Farr, MD Neurology [phone] 11/10/2019, 1:46 PM       Subjective: Patient seen and examined at bedside.   He denies any overnight fever, nausea, vomiting.  Feels better and thinks that he is ready to go home today.  Discharge Exam: Vitals:   11/13/19 0025 11/13/19 0745  BP: 128/85 118/74  Pulse:    Resp: 18   Temp: 97.8 F (36.6 C) (!) 97.5 F (36.4 C)  SpO2: 99%     General: Pt is alert, awake, not in acute distress.  Poor historian Cardiovascular: rate controlled, S1/S2 + Respiratory: bilateral decreased breath sounds at bases Abdominal: Soft, NT, ND, bowel sounds + Extremities: no edema, no cyanosis    The results of significant diagnostics from this hospitalization (including imaging, microbiology, ancillary and laboratory) are listed below for reference.     Microbiology: Recent Results (from the past 240 hour(s))  Respiratory Panel by RT PCR (Flu A&B, Covid) - Nasopharyngeal Swab     Status: None   Collection Time: 11/10/19  5:39 AM   Specimen: Nasopharyngeal Swab  Result Value Ref Range Status   SARS Coronavirus 2 by RT PCR NEGATIVE NEGATIVE Final    Comment: (NOTE) SARS-CoV-2 target nucleic acids are NOT DETECTED. The SARS-CoV-2 RNA is generally detectable in upper respiratoy specimens during the acute phase of infection. The lowest concentration of SARS-CoV-2 viral copies this assay can detect is 131 copies/mL. A negative result does not preclude SARS-Cov-2 infection and should not be used as the sole basis for treatment or other patient management decisions. A negative result may occur with  improper specimen collection/handling, submission of specimen other than nasopharyngeal swab, presence of viral mutation(s) within the areas targeted by this assay, and inadequate number of viral copies (<131 copies/mL). A negative result must be combined with clinical observations, patient history, and epidemiological information. The expected result is Negative. Fact Sheet for Patients:  https://www.moore.com/ Fact Sheet for Healthcare Providers:   https://www.young.biz/ This test is not yet ap proved or cleared by the [place] FDA and  has been authorized for detection and/or diagnosis of SARS-CoV-2 by FDA under an Emergency Use Authorization (EUA). This EUA will remain  in effect (meaning this test can be used) for the duration of the COVID-19 declaration under Section 564(b)(1) of the Act, 21 U.S.C. section 360bbb-3(b)(1), unless the authorization is terminated or revoked sooner.    Influenza A by PCR NEGATIVE NEGATIVE Final   Influenza B by PCR NEGATIVE NEGATIVE Final    Comment: (NOTE) The Xpert Xpress SARS-CoV-2/FLU/RSV assay is intended as an aid in  the diagnosis of influenza from Nasopharyngeal swab specimens and  should not be used as a sole basis for treatment. Nasal washings and  aspirates are unacceptable for Xpert Xpress SARS-CoV-2/FLU/RSV  testing. Fact Sheet for Patients: https://www.moore.com/ Fact Sheet for Healthcare Providers: https://www.young.biz/ This test is not yet approved or cleared by the [place] FDA and  has been authorized for detection and/or diagnosis of SARS-CoV-2 by  FDA under an Emergency Use Authorization (EUA). This EUA will remain  in effect (meaning this test can be used) for the duration of the  Covid-19 declaration under Section  564(b)(1) of the Act, 21  U.S.C. section 360bbb-3(b)(1), unless the authorization is  terminated or revoked. Performed at [facility], [address], [place], [place] [address]   MRSA PCR Screening     Status: None   Collection Time: 11/10/19  8:18 AM   Specimen: Nasal Mucosa; Nasopharyngeal  Result Value Ref Range Status   MRSA by PCR NEGATIVE NEGATIVE Final    Comment:        The GeneXpert MRSA Assay (FDA approved for NASAL specimens only), is one component of a comprehensive MRSA colonization surveillance program. It is not intended to diagnose MRSA infection nor to guide  or monitor treatment for MRSA infections. Performed at [facility], [address], [place], [place] [address]      Labs: BNP (last 3 results) No results for input(s): BNP in the last 8760 hours. Basic Metabolic Panel: Recent Labs  Lab 11/06/19 1155 11/10/19 0356 11/10/19 0546 11/11/19 0231 11/12/19 0243  NA 136 132* 134* 137 138  K 4.0 3.2* 3.2* 4.6 4.0  CL 98 95* 98 105 103  CO2 GLUCOSE 102* 138* 109* 92 94  BUN CREATININE 1.01 1.08 1.03 0.94 1.20  CALCIUM 8.9 9.1 8.6* 8.8* 8.8*  MG  --   --  1.8 2.1 2.3  PHOS  --   --  2.4*  --   --    Liver Function Tests: Recent Labs  Lab 11/06/19 1155 11/10/19 0546  AST 40 54*  ALT 28 35  ALKPHOS 55 45  BILITOT 1.2 1.2  PROT 7.4 6.3*  ALBUMIN 4.4 3.9   No results for input(s): LIPASE, AMYLASE in the last 168 hours. No results for input(s): AMMONIA in the last 168 hours. CBC: Recent Labs  Lab 11/06/19 1155 11/10/19 0356 11/12/19 0243  WBC 3.3* 9.8 4.8  NEUTROABS  --   --  2.5  HGB 17.7* 15.9 14.7  HCT 49.6 45.0 42.5  MCV 92.0 93.6 96.4  PLT 214 242 190   Cardiac Enzymes: No results for input(s): CKTOTAL, CKMB, CKMBINDEX, TROPONINI in the last 168 hours. BNP: Invalid input(s): POCBNP CBG: No results for input(s): GLUCAP in the last 168 hours. D-Dimer No results for input(s): DDIMER in the last 72 hours. Hgb A1c No results for input(s): HGBA1C in the last 72 hours. Lipid Profile No results for input(s): CHOL, HDL, LDLCALC, TRIG, CHOLHDL, LDLDIRECT in the last 72 hours. Thyroid function studies No results for input(s): TSH, T4TOTAL, T3FREE, THYROIDAB in the last 72 hours.  Invalid input(s): FREET3 Anemia work up No results for input(s): VITAMINB12, FOLATE, FERRITIN, TIBC, IRON, RETICCTPCT in the last 72 hours. Urinalysis    Component Value Date/Time   COLORURINE YELLOW 04/28/2019 1759   APPEARANCEUR CLEAR 04/28/2019 1759   LABSPEC 1.029 04/28/2019 1759   PHURINE 7.0  04/28/2019 1759   GLUCOSEU NEGATIVE 04/28/2019 1759   HGBUR NEGATIVE 04/28/2019 1759   BILIRUBINUR NEGATIVE 04/28/2019 1759   KETONESUR NEGATIVE 04/28/2019 1759   PROTEINUR NEGATIVE 04/28/2019 1759   NITRITE NEGATIVE 04/28/2019 1759   LEUKOCYTESUR NEGATIVE 04/28/2019 1759   Sepsis Labs Invalid input(s): PROCALCITONIN,  WBC,  LACTICIDVEN Microbiology Recent Results (from the past 240 hour(s))  Respiratory Panel by RT PCR (Flu A&B, Covid) - Nasopharyngeal Swab     Status: None   Collection Time: 11/10/19  5:39 AM   Specimen: Nasopharyngeal Swab  Result Value Ref Range Status   SARS Coronavirus 2  by RT PCR NEGATIVE NEGATIVE Final    Comment: (NOTE) SARS-CoV-2 target nucleic acids are NOT DETECTED. The SARS-CoV-2 RNA is generally detectable in upper respiratoy specimens during the acute phase of infection. The lowest concentration of SARS-CoV-2 viral copies this assay can detect is 131 copies/mL. A negative result does not preclude SARS-Cov-2 infection and should not be used as the sole basis for treatment or other patient management decisions. A negative result may occur with  improper specimen collection/handling, submission of specimen other than nasopharyngeal swab, presence of viral mutation(s) within the areas targeted by this assay, and inadequate number of viral copies (<131 copies/mL). A negative result must be combined with clinical observations, patient history, and epidemiological information. The expected result is Negative. Fact Sheet for Patients:  PinkCheek.be Fact Sheet for Healthcare Providers:  GravelBags.it This test is not yet ap proved or cleared by the [place] FDA and  has been authorized for detection and/or diagnosis of SARS-CoV-2 by FDA under an Emergency Use Authorization (EUA). This EUA will remain  in effect (meaning this test can be used) for the duration of the COVID-19 declaration under  Section 564(b)(1) of the Act, 21 U.S.C. section 360bbb-3(b)(1), unless the authorization is terminated or revoked sooner.    Influenza A by PCR NEGATIVE NEGATIVE Final   Influenza B by PCR NEGATIVE NEGATIVE Final    Comment: (NOTE) The Xpert Xpress SARS-CoV-2/FLU/RSV assay is intended as an aid in  the diagnosis of influenza from Nasopharyngeal swab specimens and  should not be used as a sole basis for treatment. Nasal washings and  aspirates are unacceptable for Xpert Xpress SARS-CoV-2/FLU/RSV  testing. Fact Sheet for Patients: PinkCheek.be Fact Sheet for Healthcare Providers: GravelBags.it This test is not yet approved or cleared by the [place] FDA and  has been authorized for detection and/or diagnosis of SARS-CoV-2 by  FDA under an Emergency Use Authorization (EUA). This EUA will remain  in effect (meaning this test can be used) for the duration of the  Covid-19 declaration under Section 564(b)(1) of the Act, 21  U.S.C. section 360bbb-3(b)(1), unless the authorization is  terminated or revoked. Performed at [facility], [place] [address] [place], [place] [address]   MRSA PCR Screening     Status: None   Collection Time: 11/10/19  8:18 AM   Specimen: Nasal Mucosa; Nasopharyngeal  Result Value Ref Range Status   MRSA by PCR NEGATIVE NEGATIVE Final    Comment:        The GeneXpert MRSA Assay (FDA approved for NASAL specimens only), is one component of a comprehensive MRSA colonization surveillance program. It is not intended to diagnose MRSA infection nor to guide or monitor treatment for MRSA infections. Performed at [facility], [place] [address], [place], [place] [address]      Time coordinating discharge: 35 minutes  SIGNED:   Aline August, MD  [facility] 11/13/2019, 8:48 AM

## 2019-11-13 NOTE — TOC Transition Note (Signed)
Transition of Care [facility]) - CM/SW Discharge Note   Patient Details  Name: Adam [name]. MRN: [id number] Date of Birth: June 22, 1986  Transition of Care [facility]) CM/SW Contact:  Zenon Mayo, RN Phone Number: 11/13/2019, 9:19 AM   Clinical Narrative:    Patient for dc today, his wife will be transporting him home.  He has follow up apt with [facility] listed on AVS.  NCM will be assisting him with medication thru [facility].     Final next level of care: Home/Self Care Barriers to Discharge: No Barriers Identified   Patient Goals and CMS Choice Patient states their goals for this hospitalization and ongoing recovery are:: get better CMS Medicare.gov Compare Post Acute Care list provided to:: Patient Choice offered to / list presented to : Patient  Discharge Placement                       Discharge Plan and Services                DME Arranged: (NA)         HH Arranged: NA          Social Determinants of Health (SDOH) Interventions     Readmission Risk Interventions No flowsheet data found.

## 2019-11-13 NOTE — Progress Notes (Signed)
Pt provide with verbal discharge instructions copy of AVS provided to patient. Patient had no further questions. VSS at dc. IV removed per orders. Flu shot administered in right IM before discharge. Pt belongings sent with patient. Pt dc via wheelchair through employee entrance to private vehicle.

## 2019-12-06 ENCOUNTER — Inpatient Hospital Stay (INDEPENDENT_AMBULATORY_CARE_PROVIDER_SITE_OTHER): Payer: Self-pay | Admitting: Primary Care

## 2020-01-15 DIAGNOSIS — F10239 Alcohol dependence with withdrawal, unspecified: Secondary | ICD-10-CM | POA: Insufficient documentation

## 2020-01-21 ENCOUNTER — Emergency Department (HOSPITAL_COMMUNITY): Payer: Self-pay

## 2020-01-21 ENCOUNTER — Encounter (HOSPITAL_COMMUNITY): Payer: Self-pay | Admitting: Emergency Medicine

## 2020-01-21 ENCOUNTER — Observation Stay (HOSPITAL_COMMUNITY)
Admission: EM | Admit: 2020-01-21 | Discharge: 2020-01-22 | Disposition: A | Discharge status: Home / Self Care | Payer: Self-pay | Attending: Internal Medicine | Admitting: Internal Medicine

## 2020-01-21 ENCOUNTER — Other Ambulatory Visit: Payer: Self-pay

## 2020-01-21 DIAGNOSIS — Z7289 Other problems related to lifestyle: Secondary | ICD-10-CM

## 2020-01-21 DIAGNOSIS — F10239 Alcohol dependence with withdrawal, unspecified: Principal | ICD-10-CM | POA: Insufficient documentation

## 2020-01-21 DIAGNOSIS — F10939 Alcohol use, unspecified with withdrawal, unspecified: Secondary | ICD-10-CM

## 2020-01-21 DIAGNOSIS — Z20822 Contact with and (suspected) exposure to covid-19: Secondary | ICD-10-CM | POA: Insufficient documentation

## 2020-01-21 DIAGNOSIS — M25512 Pain in left shoulder: Secondary | ICD-10-CM | POA: Insufficient documentation

## 2020-01-21 DIAGNOSIS — F17210 Nicotine dependence, cigarettes, uncomplicated: Secondary | ICD-10-CM | POA: Insufficient documentation

## 2020-01-21 DIAGNOSIS — T424X5A Adverse effect of benzodiazepines, initial encounter: Secondary | ICD-10-CM

## 2020-01-21 DIAGNOSIS — Y906 Blood alcohol level of 120-199 mg/100 ml: Secondary | ICD-10-CM | POA: Insufficient documentation

## 2020-01-21 DIAGNOSIS — R569 Unspecified convulsions: Secondary | ICD-10-CM

## 2020-01-21 DIAGNOSIS — Z79899 Other long term (current) drug therapy: Secondary | ICD-10-CM | POA: Insufficient documentation

## 2020-01-21 DIAGNOSIS — F13239 Sedative, hypnotic or anxiolytic dependence with withdrawal, unspecified: Secondary | ICD-10-CM

## 2020-01-21 DIAGNOSIS — I10 Essential (primary) hypertension: Secondary | ICD-10-CM | POA: Insufficient documentation

## 2020-01-21 DIAGNOSIS — R0789 Other chest pain: Secondary | ICD-10-CM | POA: Insufficient documentation

## 2020-01-21 DIAGNOSIS — G4700 Insomnia, unspecified: Secondary | ICD-10-CM

## 2020-01-21 LAB — COMPREHENSIVE METABOLIC PANEL
ALT: 23 U/L (ref 0–44)
AST: 31 U/L (ref 15–41)
Albumin: 4.1 g/dL (ref 3.5–5.0)
Alkaline Phosphatase: 74 U/L (ref 38–126)
Anion gap: 10 (ref 5–15)
BUN: 8 mg/dL (ref 6–20)
CO2: 33 mmol/L — ABNORMAL HIGH (ref 22–32)
Calcium: 9 mg/dL (ref 8.9–10.3)
Chloride: 97 mmol/L — ABNORMAL LOW (ref 98–111)
Creatinine, Ser: 0.95 mg/dL (ref 0.61–1.24)
GFR calc Af Amer: >60 mL/min (ref >60)
GFR calc non Af Amer: >60 mL/min (ref >60)
Glucose, Bld: 95 mg/dL (ref 70–99)
Potassium: 3.6 mmol/L (ref 3.5–5.1)
Sodium: 140 mmol/L (ref 135–145)
Total Bilirubin: 0.9 mg/dL (ref 0.3–1.2)
Total Protein: 6.9 g/dL (ref 6.5–8.1)

## 2020-01-21 LAB — ETHANOL: Alcohol, Ethyl (B): 148 mg/dL — ABNORMAL HIGH (ref <10)

## 2020-01-21 LAB — CBC WITH DIFFERENTIAL/PLATELET
Abs Immature Granulocytes: 0.02 10*3/uL (ref 0.00–0.07)
Basophils Absolute: 0 10*3/uL (ref 0.0–0.1)
Basophils Relative: 1 %
Eosinophils Absolute: 0 10*3/uL (ref 0.0–0.5)
Eosinophils Relative: 0 %
HCT: 45.9 % (ref 39.0–52.0)
Hemoglobin: 15.7 g/dL (ref 13.0–17.0)
Immature Granulocytes: 0 %
Lymphocytes Relative: 41 %
Lymphs Abs: 2 10*3/uL (ref 0.7–4.0)
MCH: 31.6 pg (ref 26.0–34.0)
MCHC: 34.2 g/dL (ref 30.0–36.0)
MCV: 92.4 fL (ref 80.0–100.0)
Monocytes Absolute: 0.3 10*3/uL (ref 0.1–1.0)
Monocytes Relative: 6 %
Neutro Abs: 2.5 10*3/uL (ref 1.7–7.7)
Neutrophils Relative %: 52 %
Platelets: 206 10*3/uL (ref 150–400)
RBC: 4.97 MIL/uL (ref 4.22–5.81)
RDW: 11 % — ABNORMAL LOW (ref 11.5–15.5)
WBC: 4.8 10*3/uL (ref 4.0–10.5)
nRBC: 0 % (ref 0.0–0.2)

## 2020-01-21 LAB — TROPONIN I (HIGH SENSITIVITY): Troponin I (High Sensitivity): <2 ng/L (ref <18)

## 2020-01-21 MED ORDER — FOLIC ACID 1 MG PO TABS
1.0000 mg | ORAL_TABLET | Freq: Every day | ORAL | Status: DC
  Administered 2020-01-22: 1 mg via ORAL
  Filled 2020-01-21: qty 1

## 2020-01-21 MED ORDER — ACETAMINOPHEN 650 MG RE SUPP: 650.0000 mg | Freq: Four times a day (QID) | RECTAL | Status: DC | PRN

## 2020-01-21 MED ORDER — ACETAMINOPHEN 325 MG PO TABS
650.0000 mg | ORAL_TABLET | Freq: Four times a day (QID) | ORAL | Status: DC | PRN
  Administered 2020-01-21: 650 mg via ORAL
  Filled 2020-01-21: qty 2

## 2020-01-21 MED ORDER — CHLORDIAZEPOXIDE HCL 25 MG PO CAPS
25.0000 mg | ORAL_CAPSULE | Freq: Three times a day (TID) | ORAL | Status: DC
  Administered 2020-01-21: 25 mg via ORAL
  Filled 2020-01-21: qty 1

## 2020-01-21 MED ORDER — TRAZODONE HCL 100 MG PO TABS
100.0000 mg | ORAL_TABLET | Freq: Every day | ORAL | Status: DC
  Administered 2020-01-21: 100 mg via ORAL
  Filled 2020-01-21: qty 1

## 2020-01-21 MED ORDER — ONDANSETRON HCL 4 MG PO TABS: 4.0000 mg | ORAL_TABLET | Freq: Four times a day (QID) | ORAL | Status: DC | PRN

## 2020-01-21 MED ORDER — THIAMINE HCL 100 MG PO TABS
100.0000 mg | ORAL_TABLET | Freq: Every day | ORAL | Status: DC
  Administered 2020-01-22: 100 mg via ORAL
  Filled 2020-01-21: qty 1

## 2020-01-21 MED ORDER — ENOXAPARIN SODIUM 40 MG/0.4ML ~~LOC~~ SOLN
40.0000 mg | Freq: Every day | SUBCUTANEOUS | Status: DC
  Administered 2020-01-22: 40 mg via SUBCUTANEOUS
  Filled 2020-01-21: qty 0.4

## 2020-01-21 MED ORDER — ADULT MULTIVITAMIN W/MINERALS CH
1.0000 | ORAL_TABLET | Freq: Every day | ORAL | Status: DC
  Administered 2020-01-22: 1 via ORAL
  Filled 2020-01-21: qty 1

## 2020-01-21 MED ORDER — LORAZEPAM 2 MG/ML IJ SOLN
2.0000 mg | Freq: Once | INTRAMUSCULAR | Status: DC
  Filled 2020-01-21: qty 1

## 2020-01-21 MED ORDER — ONDANSETRON HCL 4 MG/2ML IJ SOLN
4.0000 mg | Freq: Four times a day (QID) | INTRAMUSCULAR | Status: DC | PRN
  Administered 2020-01-22: 4 mg via INTRAVENOUS
  Filled 2020-01-21: qty 2

## 2020-01-21 MED ORDER — LISINOPRIL 20 MG PO TABS: 20.0000 mg | ORAL_TABLET | Freq: Every day | ORAL | Status: DC

## 2020-01-21 MED ORDER — LORAZEPAM 2 MG/ML IJ SOLN
2.0000 mg | Freq: Once | INTRAMUSCULAR | Status: AC
  Administered 2020-01-21: 2 mg via INTRAVENOUS
  Filled 2020-01-21: qty 1

## 2020-01-21 MED ORDER — CHLORDIAZEPOXIDE HCL 25 MG PO CAPS
100.0000 mg | ORAL_CAPSULE | Freq: Once | ORAL | Status: AC
  Administered 2020-01-21: 100 mg via ORAL
  Filled 2020-01-21: qty 4
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
## 2020-01-21 NOTE — ED Notes (Addendum)
Pt requested more ativan, stating he felt like he was going to seize again. Spoke with EDP who ordered another dose of ativan. Upon entering pt's room, he was sleeping and only responsive to painful stimuli. Held ativan for now as pt is not experiencing any significant withdrawal symptoms at this time.
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
## 2020-01-22 DIAGNOSIS — F13939 Sedative, hypnotic or anxiolytic use, unspecified with withdrawal, unspecified: Secondary | ICD-10-CM

## 2020-01-22 DIAGNOSIS — F10988 Alcohol use, unspecified with other alcohol-induced disorder: Secondary | ICD-10-CM

## 2020-01-22 DIAGNOSIS — I959 Hypotension, unspecified: Secondary | ICD-10-CM

## 2020-01-22 DIAGNOSIS — F17200 Nicotine dependence, unspecified, uncomplicated: Secondary | ICD-10-CM

## 2020-01-22 LAB — BASIC METABOLIC PANEL
Anion gap: 10 (ref 5–15)
BUN: 9 mg/dL (ref 6–20)
CO2: 31 mmol/L (ref 22–32)
Calcium: 9.1 mg/dL (ref 8.9–10.3)
Chloride: 98 mmol/L (ref 98–111)
Creatinine, Ser: 1.04 mg/dL (ref 0.61–1.24)
GFR calc Af Amer: >60 mL/min (ref >60)
GFR calc non Af Amer: >60 mL/min (ref >60)
Glucose, Bld: 108 mg/dL — ABNORMAL HIGH (ref 70–99)
Potassium: 3.4 mmol/L — ABNORMAL LOW (ref 3.5–5.1)
Sodium: 139 mmol/L (ref 135–145)

## 2020-01-22 LAB — SARS CORONAVIRUS 2 (TAT 6-24 HRS): SARS Coronavirus 2: NEGATIVE

## 2020-01-22 LAB — TROPONIN I (HIGH SENSITIVITY): Troponin I (High Sensitivity): 4 ng/L (ref <18)

## 2020-01-22 MED ORDER — CHLORDIAZEPOXIDE HCL 25 MG PO CAPS: 25.0000 mg | ORAL_CAPSULE | ORAL | Status: DC

## 2020-01-22 MED ORDER — CHLORDIAZEPOXIDE HCL 25 MG PO CAPS: 25.0000 mg | ORAL_CAPSULE | Freq: Every day | ORAL | Status: DC

## 2020-01-22 MED ORDER — CHLORDIAZEPOXIDE HCL 25 MG PO CAPS
25.0000 mg | ORAL_CAPSULE | Freq: Three times a day (TID) | ORAL | Status: DC
  Administered 2020-01-22 (×2): 25 mg via ORAL
  Filled 2020-01-22 (×3): qty 1

## 2020-01-22 MED ORDER — CHLORDIAZEPOXIDE HCL 25 MG PO CAPS: 25.0000 mg | ORAL_CAPSULE | Freq: Three times a day (TID) | ORAL | 0 refills | Status: DC | PRN

## 2020-01-22 MED ORDER — CHLORDIAZEPOXIDE HCL 25 MG PO CAPS: 25.0000 mg | ORAL_CAPSULE | Freq: Three times a day (TID) | ORAL | Status: DC

## 2020-01-22 NOTE — TOC Initial Note (Signed)
Transition of Care (TOC) - Initial/Assessment Note    Patient Details  Name: Adam [name]. MRN: [id number] Date of Birth: 04-Nov-1986  Transition of Care [facility]) CM/SW Contact:    Beckie Busing, RN Phone Number: [phone] 01/22/2020, 2:03 PM  Clinical Narrative:                 CM consulted to provide substance abuse resources. CM provided patient with online, inpatient and outpatient resources. Patient has no primary provider or insurance. Patient denies any current scripts for home meds. Patient was offered to be set up with [facility] for follow up but patient states he is already getting set up with a clinic on [address] but unable to recall name. Will continue to follow patient for any further discharge needs.        Patient Goals and CMS Choice        Expected Discharge Plan and Services                                                Prior Living Arrangements/Services                       Activities of Daily Living Home Assistive Devices/Equipment: None ADL Screening (condition at time of admission) Patient's cognitive ability adequate to safely complete daily activities?: Yes Is the patient deaf or have difficulty hearing?: No Does the patient have difficulty seeing, even when wearing glasses/contacts?: No Does the patient have difficulty concentrating, remembering, or making decisions?: No Patient able to express need for assistance with ADLs?: Yes Does the patient have difficulty dressing or bathing?: No Independently performs ADLs?: Yes (appropriate for developmental age) Does the patient have difficulty walking or climbing stairs?: No Weakness of Legs: None Weakness of Arms/Hands: None  Permission Sought/Granted                  Emotional Assessment              Admission diagnosis:  Seizure [facility]) [R56.9] Patient Active Problem List   Diagnosis Date Noted  . Seizure (HCC) 01/21/2020  . Alcohol withdrawal  seizure (HCC) 11/10/2019  . Benzodiazepine withdrawal (HCC) 11/10/2019  . Hypokalemia 11/10/2019  . Adjustment disorder with depressed mood 11/10/2019  . Alcohol withdrawal (HCC) 09/24/2019  . Chest pain 09/24/2019  . Alcohol withdrawal seizure without complication (HCC)   . Pneumothorax 04/28/2019  . Essential hypertension 06/25/2010   PCP:  Patient, No Pcp Per Pharmacy:   [facility] [address] ([address] [place] - [address] [place] [address] Phone: [phone] Fax: [phone]  [facility] [place], [place] - [address] [place] [address] Phone: [phone] Fax: [phone]     Social Determinants of Health (SDOH) Interventions    Readmission Risk Interventions No flowsheet data found.

## 2020-01-22 NOTE — Progress Notes (Deleted)
 Name: Adam Dean Schellinger Jr. MRN: [id number] DOB: 02/26/1986 34 y.o. PCP: Patient, No Pcp Per  Date of Admission: 01/21/2020  7:15 PM Date of Discharge: 01/22/2020 Attending Physician: Carolyn Guilloud, MD  Discharge Diagnosis: 1. ETOH Withdrawal with seizure  Discharge Medications: Allergies as of 01/22/2020   No Known Allergies     Medication List    TAKE these medications   chlordiazePOXIDE 25 MG capsule Commonly known as: LIBRIUM Take 1 capsule (25 mg total) by mouth 3 (three) times daily as needed for anxiety.   lisinopril 20 MG tablet Commonly known as: ZESTRIL Take 1 tablet (20 mg total) by mouth daily.   traZODone 100 MG tablet Commonly known as: DESYREL Take 1 tablet (100 mg total) by mouth at bedtime.       Disposition and follow-up:   Mr.Adam Dean Dolman Jr. was discharged from [facility] in stable condition.  At the hospital follow up visit please address:  1.  Follow-up: ,ake sure patient has finished librium   2.  Labs / imaging needed at time of follow-up: none  3.  Pending labs/ test needing follow-up: none  Follow-up Appointments:  He was instructed to follow-up with his primary care physician.  Patient did not stay on if to set up further follow-up appointments.  Hospital Course by problem list: 1.  ETO H use disorder with withdrawal -patient presented after recently being seen at [facility] for similar reason.  He was discharged on Ativan taper but did not take this and continued to drink after being discharged.  He does have previous history of benzodiazepine use disorder that may have complicated the situation.  Patient was loaded with Librium and monitor on CIWA.  Patient had no further withdrawal activity once being admitted.  Patient stated that he needed to get out of the hospital soon as he could for his daughters surgery.  Once patient was stable for discharge, he was given a Librium taper and instructed to follow-up with  outpatient rehab and counseled on the importance of not using alcohol or benzodiazepines on this medication as it can cause serious side effects including death.  Discharge Vitals:   BP 128/85 (BP Location: Right Arm)   Pulse 72   Temp 98.3 F (36.8 C) (Oral)   Resp 18   Ht 5' 7" (1.702 m)   Wt 75 kg   SpO2 96%   BMI 25.90 kg/m   Pertinent Labs, Studies, and Procedures:  CBC Latest Ref Rng & Units 01/29/2020 01/28/2020 01/21/2020  WBC 4.0 - 10.5 K/uL 4.7 6.4 4.8  Hemoglobin 13.0 - 17.0 g/dL 15.2 16.5 15.7  Hematocrit 39.0 - 52.0 % 44.0 47.4 45.9  Platelets 150 - 400 K/uL 230 285 206   CMP Latest Ref Rng & Units 01/29/2020 01/28/2020 01/22/2020  Glucose 70 - 99 mg/dL 91 99 108(H)  BUN 6 - 20 mg/dL 8 11 9  Creatinine 0.61 - 1.24 mg/dL 0.82 0.89 1.04  Sodium 135 - 145 mmol/L 140 140 139  Potassium 3.5 - 5.1 mmol/L 3.9 3.8 3.4(L)  Chloride 98 - 111 mmol/L 106 98 98  CO2 22 - 32 mmol/L 25 28 31  Calcium 8.9 - 10.3 mg/dL 8.5(L) 9.4 9.1  Total Protein 6.5 - 8.1 g/dL 6.7 5.7(L) -  Total Bilirubin 0.3 - 1.2 mg/dL 1.0 0.8 -  Alkaline Phos 38 - 126 U/L 63 53 -  AST 15 - 41 U/L 20 18 -  ALT 0 - 44 U/L 15 12 -       Discharge Instructions: Discharge Instructions    Diet - low sodium heart healthy   Complete by: As directed    Discharge instructions   Complete by: As directed    Mr. Adam [name],   It was a pleasure taking care of you here in the hospital.  As we discussed during your stay, we believe the seizures you had was due to withdrawal from both alcohol and benzodiazepine.  Here is my recommendations moving forward.  I will send you out on a Librium taper  Day 1: Librium 25 mg 3 times a day Day 2 Librium 25 mg 2 times a day Day 3 Librium 1 tab  Take Care!   Increase activity slowly   Complete by: As directed       Signed: Lorna Strother, MD 01/29/2020, 7:30 AM   Pager: [phone] 

## 2020-01-22 NOTE — Progress Notes (Signed)
   Subjective: HD#0   Overnight:no overnight events  Today, Adam [name] [name]. re-iterated that he had a seizure while he was driving and a subsequent episode at home witnessed by his wife. He states that he began experiencing seizures about several months ago. He began consuming alcohol about 6 months ago. Initially it consumed a heavy amount of liquor (about a 5th a day). Prior to 6 months ago, he had no history of seizure disorder. He does not consume alcohol the first time he wakes up, he does endorse family issues due to his alcohol use His current medications are Lisinopril, trazadone and ativan (taper) but he has not taken the medications yet. He reports that he had to leave early from his recent admission at [facility]. He states that his last use of EtOH was 3 days ago. He also reports that he was co-administering EtOH with Xanax. He is willing to abstain from alcohol via AA groups from his wife's church. He works as a Pensions consultant on towers. Currently he endorses shakiness, headache, nausea but denies hallucination.   Objective:  Vital signs in last 24 hours: Vitals:   01/21/20 2325 01/22/20 0419 01/22/20 0808 01/22/20 0812  BP: 125/84 (!) 103/52 (!) 90/48 (!) 93/55  Pulse: 93 79 83   Resp: 16 16 18    Temp: 97.7 F (36.5 C) 98 F (36.7 C) 97.7 F (36.5 C)   TempSrc: Oral  Oral   SpO2: 99% 98% 97%   Weight: 75 kg     Height: 5\' 7"  (1.702 m)        Const: In no apparent distress, lying comfortably in bed, conversational HEENT: Atraumatic, normocephalic Resp: CTA BL, no wheezes, crackles, rhonchi CV: RRR, no murmurs, gallop, rub Abd: Bowel sounds present, nondistended, nontender to palpation Ext: No lower extremity edema, skin is warm to touch Neuro: Cranial nerves II to XII grossly intact without focal neurological deficits Skin: Warm, no rashes Patient does not have any signs or symptoms concerning for EtOH or benzodiazepine withdrawal at this  time.  Assessment/Plan:  Active Problems:   Seizure (HCC)  Adam [name] is a 34 year old male with alcohol use disorder with a history of alcohol withdrawal seizures who was admitted for EtOH versus benzodiazepine withdrawal.  #Benzodiazepine withdrawal seizure Patient does not clinically have any signs or symptoms concerning for withdrawal at this time.  He is currently tolerating his Librium.  Patient states that he does not want to continue taking benzodiazepines over EtOH.  I counseled the patient on the importance of not using any more denies pain or alcohol as you are recovering from this hospitalization.  Patient will need a Librium taper at discharge but otherwise is stable for discharge at this time.  #Alcohol use disorder -Patient will likely benefit from continuing folic acid multivitamin thiamine supplementation  #Hypertension -Patient should hold off on taking his antihypertensive medication until following up with his primary care provider in the setting of his acute withdrawal.   Dispo: Anticipated discharge in approximately today day(s).    Eual Fines, MD 01/22/2020, 9:30 AM Pager: [phone] Internal Medicine Teaching Service

## 2020-01-28 ENCOUNTER — Inpatient Hospital Stay (HOSPITAL_COMMUNITY)
Admission: EM | Admit: 2020-01-28 | Discharge: 2020-01-30 | DRG: 897 | Disposition: A | Discharge status: Home / Self Care | Payer: Self-pay | Attending: Internal Medicine | Admitting: Internal Medicine

## 2020-01-28 ENCOUNTER — Other Ambulatory Visit: Payer: Self-pay

## 2020-01-28 ENCOUNTER — Encounter (HOSPITAL_COMMUNITY): Payer: Self-pay | Admitting: Family Medicine

## 2020-01-28 ENCOUNTER — Emergency Department (HOSPITAL_COMMUNITY): Payer: Self-pay

## 2020-01-28 DIAGNOSIS — F10931 Alcohol use, unspecified with withdrawal delirium: Secondary | ICD-10-CM | POA: Diagnosis present

## 2020-01-28 DIAGNOSIS — Z79891 Long term (current) use of opiate analgesic: Secondary | ICD-10-CM

## 2020-01-28 DIAGNOSIS — F13939 Sedative, hypnotic or anxiolytic use, unspecified with withdrawal, unspecified: Secondary | ICD-10-CM | POA: Diagnosis present

## 2020-01-28 DIAGNOSIS — Z79899 Other long term (current) drug therapy: Secondary | ICD-10-CM

## 2020-01-28 DIAGNOSIS — Z720 Tobacco use: Secondary | ICD-10-CM

## 2020-01-28 DIAGNOSIS — Z20822 Contact with and (suspected) exposure to covid-19: Secondary | ICD-10-CM | POA: Diagnosis present

## 2020-01-28 DIAGNOSIS — F10239 Alcohol dependence with withdrawal, unspecified: Secondary | ICD-10-CM

## 2020-01-28 DIAGNOSIS — F13239 Sedative, hypnotic or anxiolytic dependence with withdrawal, unspecified: Secondary | ICD-10-CM | POA: Diagnosis present

## 2020-01-28 DIAGNOSIS — F10231 Alcohol dependence with withdrawal delirium: Principal | ICD-10-CM | POA: Diagnosis present

## 2020-01-28 DIAGNOSIS — I10 Essential (primary) hypertension: Secondary | ICD-10-CM | POA: Diagnosis present

## 2020-01-28 DIAGNOSIS — F10939 Alcohol use, unspecified with withdrawal, unspecified: Secondary | ICD-10-CM | POA: Diagnosis present

## 2020-01-28 DIAGNOSIS — R079 Chest pain, unspecified: Secondary | ICD-10-CM

## 2020-01-28 DIAGNOSIS — Z8249 Family history of ischemic heart disease and other diseases of the circulatory system: Secondary | ICD-10-CM

## 2020-01-28 DIAGNOSIS — R569 Unspecified convulsions: Secondary | ICD-10-CM

## 2020-01-28 LAB — HEPATIC FUNCTION PANEL
ALT: 12 U/L (ref 0–44)
AST: 18 U/L (ref 15–41)
Albumin: 3.4 g/dL — ABNORMAL LOW (ref 3.5–5.0)
Alkaline Phosphatase: 53 U/L (ref 38–126)
Bilirubin, Direct: 0.1 mg/dL (ref 0.0–0.2)
Indirect Bilirubin: 0.7 mg/dL (ref 0.3–0.9)
Total Bilirubin: 0.8 mg/dL (ref 0.3–1.2)
Total Protein: 5.7 g/dL — ABNORMAL LOW (ref 6.5–8.1)

## 2020-01-28 LAB — CBC
HCT: 47.4 % (ref 39.0–52.0)
Hemoglobin: 16.5 g/dL (ref 13.0–17.0)
MCH: 31.9 pg (ref 26.0–34.0)
MCHC: 34.8 g/dL (ref 30.0–36.0)
MCV: 91.5 fL (ref 80.0–100.0)
Platelets: 285 10*3/uL (ref 150–400)
RBC: 5.18 MIL/uL (ref 4.22–5.81)
RDW: 11.1 % — ABNORMAL LOW (ref 11.5–15.5)
WBC: 6.4 10*3/uL (ref 4.0–10.5)
nRBC: 0 % (ref 0.0–0.2)

## 2020-01-28 LAB — TROPONIN I (HIGH SENSITIVITY)
Troponin I (High Sensitivity): <2 ng/L (ref <18)
Troponin I (High Sensitivity): <2 ng/L

## 2020-01-28 LAB — BASIC METABOLIC PANEL
Anion gap: 14 (ref 5–15)
BUN: 11 mg/dL (ref 6–20)
CO2: 28 mmol/L (ref 22–32)
Calcium: 9.4 mg/dL (ref 8.9–10.3)
Chloride: 98 mmol/L (ref 98–111)
Creatinine, Ser: 0.89 mg/dL (ref 0.61–1.24)
GFR calc Af Amer: >60 mL/min (ref >60)
GFR calc non Af Amer: >60 mL/min (ref >60)
Glucose, Bld: 99 mg/dL (ref 70–99)
Potassium: 3.8 mmol/L (ref 3.5–5.1)
Sodium: 140 mmol/L (ref 135–145)

## 2020-01-28 LAB — PROTIME-INR
INR: 1 (ref 0.8–1.2)
Prothrombin Time: 13.4 s (ref 11.4–15.2)

## 2020-01-28 LAB — PHOSPHORUS: Phosphorus: 2 mg/dL — ABNORMAL LOW (ref 2.5–4.6)

## 2020-01-28 LAB — MAGNESIUM: Magnesium: 1.5 mg/dL — ABNORMAL LOW (ref 1.7–2.4)

## 2020-01-28 LAB — ETHANOL: Alcohol, Ethyl (B): 190 mg/dL — ABNORMAL HIGH (ref <10)

## 2020-01-28 LAB — LACTIC ACID, PLASMA: Lactic Acid, Venous: 2.5 mmol/L (ref 0.5–1.9)

## 2020-01-28 LAB — GLUCOSE, CAPILLARY: Glucose-Capillary: 90 mg/dL (ref 70–99)

## 2020-01-28 MED ORDER — LORAZEPAM 2 MG/ML IJ SOLN
1.0000 mg | INTRAMUSCULAR | Status: DC | PRN
  Administered 2020-01-29: 3 mg via INTRAVENOUS
  Administered 2020-01-29 – 2020-01-30 (×4): 1 mg via INTRAVENOUS
  Filled 2020-01-28 (×4): qty 1
  Filled 2020-01-28: qty 2

## 2020-01-28 MED ORDER — ONDANSETRON HCL 4 MG/2ML IJ SOLN
4.0000 mg | Freq: Once | INTRAMUSCULAR | Status: AC
  Administered 2020-01-28: 4 mg via INTRAVENOUS
  Filled 2020-01-28: qty 2

## 2020-01-28 MED ORDER — SODIUM CHLORIDE 0.9 % IV BOLUS
1000.0000 mL | Freq: Once | INTRAVENOUS | Status: AC
  Administered 2020-01-28: 1000 mL via INTRAVENOUS

## 2020-01-28 MED ORDER — THIAMINE HCL 100 MG/ML IJ SOLN: 100.0000 mg | Freq: Every day | INTRAMUSCULAR | Status: DC

## 2020-01-28 MED ORDER — SODIUM CHLORIDE 0.9% FLUSH
3.0000 mL | Freq: Once | INTRAVENOUS | Status: AC
  Administered 2020-01-28: 3 mL via INTRAVENOUS

## 2020-01-28 MED ORDER — LORAZEPAM 1 MG PO TABS: 1.0000 mg | ORAL_TABLET | ORAL | Status: DC | PRN

## 2020-01-28 MED ORDER — ADULT MULTIVITAMIN W/MINERALS CH
1.0000 | ORAL_TABLET | Freq: Every day | ORAL | Status: DC
  Administered 2020-01-29 – 2020-01-30 (×2): 1 via ORAL
  Filled 2020-01-28 (×2): qty 1

## 2020-01-28 MED ORDER — FOLIC ACID 1 MG PO TABS
1.0000 mg | ORAL_TABLET | Freq: Every day | ORAL | Status: DC
  Administered 2020-01-29 – 2020-01-30 (×2): 1 mg via ORAL
  Filled 2020-01-28 (×2): qty 1

## 2020-01-28 MED ORDER — LORAZEPAM 2 MG/ML IJ SOLN
2.0000 mg | Freq: Once | INTRAMUSCULAR | Status: AC
  Administered 2020-01-28: 2 mg via INTRAVENOUS
  Filled 2020-01-28: qty 1

## 2020-01-28 MED ORDER — SODIUM CHLORIDE 0.9 % IV SOLN: INTRAVENOUS | Status: DC

## 2020-01-28 MED ORDER — THIAMINE HCL 100 MG PO TABS
100.0000 mg | ORAL_TABLET | Freq: Every day | ORAL | Status: DC
  Administered 2020-01-29 – 2020-01-30 (×2): 100 mg via ORAL
  Filled 2020-01-28 (×2): qty 1

## 2020-01-28 MED ORDER — THIAMINE HCL 100 MG/ML IJ SOLN
100.0000 mg | Freq: Once | INTRAMUSCULAR | Status: AC
  Administered 2020-01-28: 100 mg via INTRAVENOUS
  Filled 2020-01-28: qty 2

## 2020-01-28 NOTE — H&P (Signed)
Adam [name] [employer]. YTW:[id number] DOB: [birth year] DOA: 01/28/2020     PCP: Patient, No Pcp Per   Outpatient Specialists:  NONE    Patient arrived to ER on 01/28/20 at [place]  Patient coming from: home Lives  With family    Chief Complaint:   Chief Complaint  Patient presents with  . Seizures    HPI: Adam [name] Adam [name]. is a 34 y.o. male with medical history significant of alcohol abuse withdrawal alcohol seizures, bulimia, hypertension    Presented with  recurrent seizure. Had two seizures today  Patient was just recently admitted for alcohol withdrawal seizure to teaching service at that time he was on Ativan taper and was using alcohol together with Xanax. It seems that he was put on Librium protocol He was discharged home on Librium taper possibly on 2 March He has blood pressures were soft so his blood pressure medications were discontinued until he is seen by primary care provider Patient went home continue to drink. He had 2 witnessed seizures by his spouse today and then another in the emergency department  Patient states that he was not able to feel Librium as an outpatient because he did not have his driver's license on him.  When he realized he was going to be withdrawal he try to use alcohol instead but there was not enough and he proceeded to withdrawal again.  Patient states he is highly motivated in quitting drinking. Endorses nausea and vomiting Patient endorses last alcoholic drink was today.  Infectious risk factors:  Reports N/V/     in house  PCR testing  Pending  Lab Results  Component Value Date   [place] 01/21/2020   [place] NEGATIVE 11/10/2019   [place] NEGATIVE 09/24/2019   [place] NEGATIVE 04/28/2019    Regarding pertinent Chronic problems:    HTN currently blood pressure medications on hold    While in ER: Witnessed patient in emergency department    The following Work up has been ordered so  far:  Orders Placed This Encounter  Procedures  . Critical Care  . SARS CORONAVIRUS 2 (TAT 6-24 HRS) Nasopharyngeal Nasopharyngeal Swab  . DG Chest Port 1 View  . Basic metabolic panel  . CBC  . Glucose, capillary  . Ethanol  . Diet NPO time specified  . Cardiac monitoring  . Saline Lock IV, Maintain IV access  . Cardiac monitoring  . Saline Lock IV, Maintain IV access  . Consult to hospitalist  ALL PATIENTS BEING ADMITTED/HAVING PROCEDURES NEED COVID-19 SCREENING  . Pulse oximetry, continuous  . Pulse oximetry, continuous  . CBG monitoring, ED  . ED EKG  . Seizure precautions    Following Medications were ordered in ER: Medications  sodium chloride flush (NS) 0.9 % injection 3 mL (3 mLs Intravenous Given 01/28/20 2028)  LORazepam (ATIVAN) injection 2 mg (2 mg Intravenous Given 01/28/20 2045)  sodium chloride 0.9 % bolus 1,000 mL (0 mLs Intravenous Stopped 01/28/20 2234)  sodium chloride 0.9 % bolus 1,000 mL (0 mLs Intravenous Stopped 01/28/20 2234)  ondansetron (ZOFRAN) injection 4 mg (4 mg Intravenous Given 01/28/20 2116)        Consult Orders  (From admission, onward)         Start     Ordered   01/28/20 2218  Consult to hospitalist  ALL PATIENTS BEING ADMITTED/HAVING PROCEDURES NEED COVID-19 SCREENING  Once    Comments: ALL PATIENTS BEING ADMITTED/HAVING PROCEDURES NEED COVID-19 SCREENING  Provider:  (Not yet  assigned)  Question Answer Comment  Place call to: [facility]   Reason for Consult Admit      01/28/20 2217          Significant initial  Findings: Abnormal Labs Reviewed  CBC - Abnormal; Notable for the following components:      Result Value   RDW 11.1 (*)    All other components within normal limits  ETHANOL - Abnormal; Notable for the following components:   Alcohol, Ethyl (B) 190 (*)    All other components within normal limits     Otherwise labs showing:    Recent Labs  Lab 01/22/20 0347 01/28/20 2027  NA 139 140  K 3.4* 3.8  CO2 31  28  GLUCOSE 108* 99  BUN 9 11  CREATININE 1.04 0.89  CALCIUM 9.1 9.4    Cr  Stable,  Lab Results  Component Value Date   CREATININE 0.89 01/28/2020   CREATININE 1.04 01/22/2020   CREATININE 0.95 01/21/2020    No results for input(s): AST, ALT, ALKPHOS, BILITOT, PROT, ALBUMIN in the last 168 hours. Lab Results  Component Value Date   CALCIUM 9.4 01/28/2020   PHOS 2.4 (L) 11/10/2019     WBC      Component Value Date/Time   WBC 6.4 01/28/2020 2027   ANC    Component Value Date/Time   NEUTROABS 2.5 01/21/2020 1941   ALC No components found for: LYMPHAB   Plt: Lab Results  Component Value Date   PLT 285 01/28/2020       COVID-19 Labs  No results for input(s): DDIMER, FERRITIN, LDH, CRP in the last 72 hours.  Lab Results  Component Value Date   SARSCOV2NAA NEGATIVE 01/21/2020   SARSCOV2NAA NEGATIVE 11/10/2019   SARSCOV2NAA NEGATIVE 09/24/2019   SARSCOV2NAA NEGATIVE 04/28/2019    HG/HCT   stable,      Component Value Date/Time   HGB 16.5 01/28/2020 2027   HGB 16.5 05/14/2012 1916   HCT 47.4 01/28/2020 2027   HCT 50.1 05/14/2012 1916          ECG: Ordered Personally reviewed by me showing: HR : 98 Rhythm NSR,   no evidence of ischemic changes QTC 414    CBG (last 3)  Recent Labs    01/28/20 2008  GLUCAP 90       UA   not ordered      Ordered  CT HEAD from 3/1 was negative  CXR -  NON acute      ED Triage Vitals  Enc Vitals Group     BP 01/28/20 1951 (!) 168/103     Pulse Rate 01/28/20 1951 (!) 105     Resp 01/28/20 1951 16     Temp 01/28/20 1951 97.8 F (36.6 C)     Temp Source 01/28/20 1951 Oral     SpO2 01/28/20 1951 99 %     Weight 01/28/20 1951 165 lb (74.8 kg)     Height 01/28/20 1951 5\' 7"  (1.702 m)     Head Circumference --      Peak Flow --      Pain Score 01/28/20 1958 10     Pain Loc --      Pain Edu? --      Excl. in GC? --   TMAX(24)@       Latest  Blood pressure 106/70, pulse 87, temperature 97.8 F (36.6  C), temperature source Oral, resp. rate 14, height 5\' 7"  (1.702 m), weight 74.8  kg, SpO2 94 %.    Hospitalist was called for admission for alcohol/benzo withdraw seizure   Review of Systems:    Pertinent positives include: unable to obtain due to withdraw  Constitutional:  No weight loss, night sweats, Fevers, chills, fatigue, weight loss  HEENT:  No headaches, Difficulty swallowing,Tooth/dental problems,Sore throat,  No sneezing, itching, ear ache, nasal congestion, post nasal drip,  Cardio-vascular:  No chest pain, Orthopnea, PND, anasarca, dizziness, palpitations.no Bilateral lower extremity swelling  GI:  No heartburn, indigestion, abdominal pain, nausea, vomiting, diarrhea, change in bowel habits, loss of appetite, melena, blood in stool, hematemesis Resp:  no shortness of breath at rest. No dyspnea on exertion, No excess mucus, no productive cough, No non-productive cough, No coughing up of blood.No change in color of mucus.No wheezing. Skin:  no rash or lesions. No jaundice GU:  no dysuria, change in color of urine, no urgency or frequency. No straining to urinate.  No flank pain.  Musculoskeletal:  No joint pain or no joint swelling. No decreased range of motion. No back pain.  Psych:  No change in mood or affect. No depression or anxiety. No memory loss.  Neuro: no localizing neurological complaints, no tingling, no weakness, no double vision, no gait abnormality, no slurred speech, no confusion  All systems reviewed and apart from HOPI all are negative  Past Medical History:   Past Medical History:  Diagnosis Date  . Eating disorder    history of bulemia  . HYPERTENSION 06/25/2010  . Hypertension     History reviewed. No pertinent surgical history.  Social History:  Ambulatory  Independently      reports that he has quit smoking. He uses smokeless tobacco. He reports current alcohol use. He reports previous drug use.   Family History:   Family History   Problem Relation Age of Onset  . Cancer Mother        lung  . Hypertension Father     Allergies: No Known Allergies   Prior to Admission medications   Medication Sig Start Date End Date Taking? Authorizing Provider  chlordiazePOXIDE (LIBRIUM) 25 MG capsule Take 1 capsule (25 mg total) by mouth 3 (three) times daily as needed for anxiety. 01/22/20   Yvette Rack, MD  lisinopril (ZESTRIL) 20 MG tablet Take 1 tablet (20 mg total) by mouth daily. 11/13/19   Glade Lloyd, MD  traZODone (DESYREL) 100 MG tablet Take 1 tablet (100 mg total) by mouth at bedtime. 11/13/19   Glade Lloyd, MD   Physical Exam: Blood pressure 106/70, pulse 87, temperature 97.8 F (36.6 C), temperature source Oral, resp. rate 14, height 5\' 7"  (1.702 m), weight 74.8 kg, SpO2 94 %. 1. General:  in No Acute distress    Chronically ill -appearing 2. Psychological: Alert and   Oriented 3. Head/ENT:     Dry Mucous Membranes                          Head Non traumatic, neck supple                            Poor Dentition 4. SKIN: decreased Skin turgor,  Skin clean Dry and intact no rash 5. Heart: Regular rate and rhythm no Murmur, no Rub or gallop 6. Lungs:   no wheezes or crackles   7. Abdomen: Soft, non-tender, Non distended bowel sounds present 8. Lower extremities: no clubbing, cyanosis, no  edema 9. Neurologically Grossly intact, moving all 4 extremities equally tremulous  10. MSK: Normal range of motion   All other LABS:     Recent Labs  Lab 01/28/20 2027  WBC 6.4  HGB 16.5  HCT 47.4  MCV 91.5  PLT 285     Recent Labs  Lab 01/22/20 0347 01/28/20 2027  NA 139 140  K 3.4* 3.8  CL 98 98  CO2 31 28  GLUCOSE 108* 99  BUN 9 11  CREATININE 1.04 0.89  CALCIUM 9.1 9.4     No results for input(s): AST, ALT, ALKPHOS, BILITOT, PROT, ALBUMIN in the last 168 hours.     Cultures: No results found for: SDES, SPECREQUEST, CULT, REPTSTATUS   Radiological Exams on Admission: DG Chest Port 1  View  Result Date: 01/28/2020 CLINICAL DATA:  Witnessed seizure EXAM: PORTABLE CHEST 1 VIEW COMPARISON:  01/21/2020 FINDINGS: The heart size and mediastinal contours are within normal limits. Both lungs are clear. The visualized skeletal structures are unremarkable. IMPRESSION: No active disease. Electronically Signed   By: Jasmine Pang M.D.   On: 01/28/2020 20:46    Chart has been reviewed   Assessment/Plan   34 y.o. male with medical history significant of alcohol abuse withdrawal alcohol seizures, bulimia, hypertension  Admitted for alcohol withdrawal seizures  Present on Admission: Alcohol withdraw seizure -order seizure precautions monitor in stepdown given recurrent seizures today, This is recurrent  occurrence for the patient with repeated admissions for the same  . Essential hypertension -hold home medications for now   . Benzodiazepine withdrawal (HCC)/ Alcohol withdrawal (HCC) -order CIWA protocol monitor in stepdown patient is at high potential for decompensation.  Check liver function.  Will need further discussion regarding alcohol cessation once able   Other plan as per orders.  DVT prophylaxis:  SCD    Code Status:  FULL CODE   Family Communication:   Family not at  Bedside    Disposition Plan:   To home once workup is complete and patient is stable                                      Social Work  consulted                                     Consults called: none   Admission status:  ED Disposition    ED Disposition Condition Comment   Admit  The patient appears reasonably stabilized for admission considering the current resources, flow, and capabilities available in the ED at this time, and I doubt any other [facility] requiring further screening and/or treatment in the ED prior to admission is  present.         inpatient     I Expect 2 midnight stay secondary to severity of patient's current illness need for inpatient interventions justified by the  following:   hemodynamic instability despite optimal treatment (tachycardia  )   and extensive comorbidities including:  substance abuse    That are currently affecting medical management.   I expect  patient to be hospitalized for 2 midnights requiring inpatient medical care.  Patient is at high risk for adverse outcome (such as loss of life or disability) if not treated.  Indication for inpatient stay as follows:  Severe change from baseline regarding mental status  Hemodynamic instability despite maximal medical therapy,    inability to maintain oral hydration     Need for  , IV fluids, IV benzodiazepines    Level of care         SDU tele indefinitely please discontinue once patient no longer qualifies   Precautions: admitted as PUI  No active isolations  If Covid PCR is negative  - please DC precautions   PPE: Used by the provider:   P100  eye Goggles,  Gloves    Almando Brawley 01/29/2020, 12:05 AM    [facility]     after 2 AM please page floor coverage PA If 7AM-7PM, please contact the day team taking care of the patient using [email or web address]   Patient was evaluated in the context of the global COVID-19 pandemic, which necessitated consideration that the patient might be at risk for infection with the SARS-CoV-2 virus that causes COVID-19. Institutional protocols and algorithms that pertain to the evaluation of patients at risk for COVID-19 are in a state of rapid change based on information released by regulatory bodies including the CDC and federal and state organizations. These policies and algorithms were followed during the patient's care.

## 2020-01-28 NOTE — ED Notes (Signed)
Went into pt room to draw blood and after tying the tourniquet the pt convulsed and slid down to the floor and began to vomit. Called for help and when he came to realizing what happened he sat up and was taken to a room.

## 2020-01-28 NOTE — ED Triage Notes (Signed)
Patient states he had a witnessed seizure by his spouse about 2 hours ago and had another seizure earlier today. He reports the seizures are not all related to ETOH withdraws. Patient states he has been trying to withdrawal over the last 5 days. Patient reports his last drink about 3-4 hours ago.

## 2020-01-28 NOTE — ED Provider Notes (Signed)
[facility] Emergency Department Provider Note MRN:  [id number]  Arrival date & time: 01/28/20     Chief Complaint   Seizures   History of Present Illness   Adam Enyeart Fernholz [name] is a 34 y.o. year-old male with a history of HTN, ethanol abuse presenting to the ED with chief complaint of seizure.  2 seizures today.  Has been trying to cut down on alcohol recently.  Seizure in triage as well.  I was unable to obtain an accurate HPI, PMH, or ROS due to the patient's alcohol withdrawal.  Level 5 caveat.  Review of Systems  Positive for alcohol withdrawal, seizure, nausea, vomiting.  Patient's Health History    Past Medical History:  Diagnosis Date  . Eating disorder    history of bulemia  . HYPERTENSION 06/25/2010  . Hypertension     History reviewed. No pertinent surgical history.  Family History  Problem Relation Age of Onset  . Cancer Mother        lung  . Hypertension Father     Social History   Socioeconomic History  . Marital status: Married    Spouse name: Not on file  . Number of children: Not on file  . Years of education: Not on file  . Highest education level: Not on file  Occupational History  . Not on file  Tobacco Use  . Smoking status: Former Research scientist (life sciences)  . Smokeless tobacco: Current User  Substance and Sexual Activity  . Alcohol use: Yes    Comment: Last drink: 3-4 hours ago   . Drug use: Not Currently  . Sexual activity: Not on file  Other Topics Concern  . Not on file  Social History Narrative   ** Merged History Encounter **       Social Determinants of Health   Financial Resource Strain:   . Difficulty of Paying Living Expenses: Not on file  Food Insecurity:   . Worried About Charity fundraiser in the Last Year: Not on file  . Ran Out of Food in the Last Year: Not on file  Transportation Needs:   . Lack of Transportation (Medical): Not on file  . Lack of Transportation (Non-Medical): Not on file  Physical  Activity:   . Days of Exercise per Week: Not on file  . Minutes of Exercise per Session: Not on file  Stress:   . Feeling of Stress : Not on file  Social Connections:   . Frequency of Communication with Friends and Family: Not on file  . Frequency of Social Gatherings with Friends and Family: Not on file  . Attends Religious Services: Not on file  . Active Member of Clubs or Organizations: Not on file  . Attends Archivist Meetings: Not on file  . Marital Status: Not on file  Intimate Partner Violence:   . Fear of Current or Ex-Partner: Not on file  . Emotionally Abused: Not on file  . Physically Abused: Not on file  . Sexually Abused: Not on file     Physical Exam   Vitals:   01/28/20 1951 01/28/20 2130  BP: (!) 168/103 136/84  Pulse: (!) 105 86  Resp: 16 17  Temp: 97.8 F (36.6 C)   SpO2: 99% 98%    CONSTITUTIONAL: Chronically ill-appearing, NAD, intermittent vomiting NEURO:  Alert and oriented x 3, no focal deficits EYES:  eyes equal and reactive ENT/NECK:  no LAD, no JVD CARDIO: Tachycardic rate, well-perfused, normal S1 and S2 PULM:  CTAB no wheezing or rhonchi GI/GU:  normal bowel sounds, non-distended, non-tender MSK/SPINE:  No gross deformities, no edema SKIN:  no rash, atraumatic PSYCH:  Appropriate speech and behavior  *Additional and/or pertinent findings included in MDM below  Diagnostic and Interventional Summary    EKG Interpretation  Date/Time:  Monday January 28 2020 20:06:53 EST Ventricular Rate:  98 PR Interval:    QRS Duration: 100 QT Interval:  324 QTC Calculation: 414 R Axis:   76 Text Interpretation: Sinus rhythm Borderline repolarization abnormality Baseline wander in lead(s) V5 Confirmed by Kennis Carina [phone]) on 01/28/2020 8:53:21 PM      Cardiac Monitoring Interpretation:  Labs Reviewed  CBC - Abnormal; Notable for the following components:      Result Value   RDW 11.1 (*)    All other components within normal limits    ETHANOL - Abnormal; Notable for the following components:   Alcohol, Ethyl (B) 190 (*)    All other components within normal limits  SARS CORONAVIRUS 2 (TAT 6-24 HRS)  GLUCOSE, CAPILLARY  BASIC METABOLIC PANEL  CBG MONITORING, ED  TROPONIN I (HIGH SENSITIVITY)  TROPONIN I (HIGH SENSITIVITY)    DG Chest Port 1 View  Final Result      Medications  sodium chloride flush (NS) 0.9 % injection 3 mL (3 mLs Intravenous Given 01/28/20 2028)  LORazepam (ATIVAN) injection 2 mg (2 mg Intravenous Given 01/28/20 2045)  sodium chloride 0.9 % bolus 1,000 mL (1,000 mLs Intravenous New Bag/Given 01/28/20 2050)  sodium chloride 0.9 % bolus 1,000 mL (1,000 mLs Intravenous New Bag/Given 01/28/20 2049)  ondansetron (ZOFRAN) injection 4 mg (4 mg Intravenous Given 01/28/20 2116)     Procedures  /  Critical Care .Critical Care Performed by: Sabas Sous, MD Authorized by: Sabas Sous, MD   Critical care provider statement:    Critical care time (minutes):  35   Critical care was necessary to treat or prevent imminent or life-threatening deterioration of the following conditions: Acute alcohol withdrawal with multiple seizures.   Critical care was time spent personally by me on the following activities:  Discussions with consultants, evaluation of patient's response to treatment, examination of patient, ordering and performing treatments and interventions, ordering and review of laboratory studies, ordering and review of radiographic studies, pulse oximetry, re-evaluation of patient's condition, obtaining history from patient or surrogate and review of old charts    ED Course and Medical Decision Making  I have reviewed the triage vital signs, the nursing notes, and pertinent available records from the EMR.  Pertinent labs & imaging results that were available during my care of the patient were reviewed by me and considered in my medical decision making (see below for details).     Suspect alcohol  withdrawal seizures in this 34 year old male with history of the same, providing fluids, Ativan, CIWA protocol, will need admission.  10:18 PM update: Patient clinically improving with IV Ativan, given the multiple seizures in the setting of withdrawal, will admit to hospital service.  Elmer Sow. Pilar Plate, MD [facility] Emergency Medicine [facility] [email or web address]  Final Clinical Impressions(s) / ED Diagnoses     ICD-10-CM   1. Alcohol withdrawal seizure with complication (HCC)  F10.239    R56.9   2. Chest pain  R07.9 DG Chest [facility] 1 View    DG Chest Port 1 View    ED Discharge Orders    None       Discharge Instructions Discussed with and  Provided to Patient:   Discharge Instructions   None       Sabas Sous, MD 01/28/20 2218

## 2020-01-29 ENCOUNTER — Other Ambulatory Visit: Payer: Self-pay | Admitting: Internal Medicine

## 2020-01-29 DIAGNOSIS — F10231 Alcohol dependence with withdrawal delirium: Secondary | ICD-10-CM | POA: Diagnosis present

## 2020-01-29 DIAGNOSIS — F10931 Alcohol use, unspecified with withdrawal delirium: Secondary | ICD-10-CM | POA: Diagnosis present

## 2020-01-29 LAB — COMPREHENSIVE METABOLIC PANEL
ALT: 15 U/L (ref 0–44)
AST: 20 U/L (ref 15–41)
Albumin: 4 g/dL (ref 3.5–5.0)
Alkaline Phosphatase: 63 U/L (ref 38–126)
Anion gap: 9 (ref 5–15)
BUN: 8 mg/dL (ref 6–20)
CO2: 25 mmol/L (ref 22–32)
Calcium: 8.5 mg/dL — ABNORMAL LOW (ref 8.9–10.3)
Chloride: 106 mmol/L (ref 98–111)
Creatinine, Ser: 0.82 mg/dL (ref 0.61–1.24)
GFR calc Af Amer: >60 mL/min (ref >60)
GFR calc non Af Amer: >60 mL/min (ref >60)
Glucose, Bld: 91 mg/dL (ref 70–99)
Potassium: 3.9 mmol/L (ref 3.5–5.1)
Sodium: 140 mmol/L (ref 135–145)
Total Bilirubin: 1 mg/dL (ref 0.3–1.2)
Total Protein: 6.7 g/dL (ref 6.5–8.1)

## 2020-01-29 LAB — CBC
HCT: 44 % (ref 39.0–52.0)
Hemoglobin: 15.2 g/dL (ref 13.0–17.0)
MCH: 32.7 pg (ref 26.0–34.0)
MCHC: 34.5 g/dL (ref 30.0–36.0)
MCV: 94.6 fL (ref 80.0–100.0)
Platelets: 230 10*3/uL (ref 150–400)
RBC: 4.65 MIL/uL (ref 4.22–5.81)
RDW: 11.3 % — ABNORMAL LOW (ref 11.5–15.5)
WBC: 4.7 10*3/uL (ref 4.0–10.5)
nRBC: 0 % (ref 0.0–0.2)

## 2020-01-29 LAB — PHOSPHORUS: Phosphorus: 2.2 mg/dL — ABNORMAL LOW (ref 2.5–4.6)

## 2020-01-29 LAB — SARS CORONAVIRUS 2 (TAT 6-24 HRS): SARS Coronavirus 2: NEGATIVE

## 2020-01-29 LAB — MAGNESIUM: Magnesium: 2.6 mg/dL — ABNORMAL HIGH (ref 1.7–2.4)

## 2020-01-29 LAB — TSH: TSH: 1.29 u[IU]/mL (ref 0.350–4.500)

## 2020-01-29 LAB — MRSA PCR SCREENING: MRSA by PCR: NEGATIVE

## 2020-01-29 LAB — LACTIC ACID, PLASMA: Lactic Acid, Venous: 2.2 mmol/L (ref 0.5–1.9)

## 2020-01-29 MED ORDER — CHLORDIAZEPOXIDE HCL 10 MG PO CAPS
10.0000 mg | ORAL_CAPSULE | Freq: Three times a day (TID) | ORAL | Status: DC
  Administered 2020-01-29 – 2020-01-30 (×3): 10 mg via ORAL
  Filled 2020-01-29 (×3): qty 1

## 2020-01-29 MED ORDER — ONDANSETRON HCL 4 MG/2ML IJ SOLN
4.0000 mg | Freq: Four times a day (QID) | INTRAMUSCULAR | Status: DC | PRN
  Administered 2020-01-29 (×2): 4 mg via INTRAVENOUS
  Filled 2020-01-29 (×2): qty 2

## 2020-01-29 MED ORDER — CHLORHEXIDINE GLUCONATE CLOTH 2 % EX PADS
6.0000 | MEDICATED_PAD | Freq: Every day | CUTANEOUS | Status: DC
  Administered 2020-01-29 – 2020-01-30 (×2): 6 via TOPICAL

## 2020-01-29 MED ORDER — POTASSIUM & SODIUM PHOSPHATES 280-160-250 MG PO PACK
1.0000 | PACK | Freq: Three times a day (TID) | ORAL | Status: DC
  Administered 2020-01-29 (×3): 1 via ORAL
  Filled 2020-01-29 (×7): qty 1

## 2020-01-29 MED ORDER — METOPROLOL TARTRATE 5 MG/5ML IV SOLN: 5.0000 mg | INTRAVENOUS | Status: DC | PRN

## 2020-01-29 MED ORDER — ONDANSETRON HCL 4 MG PO TABS: 4.0000 mg | ORAL_TABLET | Freq: Four times a day (QID) | ORAL | Status: DC | PRN

## 2020-01-29 MED ORDER — MAGNESIUM SULFATE 2 GM/50ML IV SOLN
2.0000 g | Freq: Once | INTRAVENOUS | Status: AC
  Administered 2020-01-29: 2 g via INTRAVENOUS
  Filled 2020-01-29: qty 50

## 2020-01-29 MED ORDER — SODIUM CHLORIDE 0.9 % IV SOLN: INTRAVENOUS | Status: AC

## 2020-01-29 MED ORDER — ACETAMINOPHEN 325 MG PO TABS
650.0000 mg | ORAL_TABLET | ORAL | Status: DC | PRN
  Administered 2020-01-29: 650 mg via ORAL
  Filled 2020-01-29: qty 2

## 2020-01-29 MED ORDER — CLONIDINE HCL 0.1 MG PO TABS
0.1000 mg | ORAL_TABLET | Freq: Three times a day (TID) | ORAL | Status: DC
  Administered 2020-01-29 – 2020-01-30 (×4): 0.1 mg via ORAL
  Filled 2020-01-29 (×4): qty 1

## 2020-01-29 MED ORDER — ENOXAPARIN SODIUM 40 MG/0.4ML ~~LOC~~ SOLN
40.0000 mg | SUBCUTANEOUS | Status: DC
  Administered 2020-01-29: 40 mg via SUBCUTANEOUS
  Filled 2020-01-29: qty 0.4

## 2020-01-29 NOTE — Discharge Summary (Addendum)
Name: [name] [facility] MRN: [id number] DOB: [birth year] 34 y.o. PCP: Patient, No Pcp Per  Date of Admission: 01/21/2020  7:15 PM Date of Discharge: 01/22/2020 Attending Physician: Velna Ochs, MD  Discharge Diagnosis: 1. ETOH Withdrawal with seizure  Discharge Medications: Allergies as of 01/22/2020   No Known Allergies     Medication List    TAKE these medications   chlordiazePOXIDE 25 MG capsule Commonly known as: LIBRIUM Take 1 capsule (25 mg total) by mouth 3 (three) times daily as needed for anxiety.   lisinopril 20 MG tablet Commonly known as: ZESTRIL Take 1 tablet (20 mg total) by mouth daily.   traZODone 100 MG tablet Commonly known as: DESYREL Take 1 tablet (100 mg total) by mouth at bedtime.       Disposition and follow-up:   Adam [name]. was discharged from [facility] in stable condition.  At the hospital follow up visit please address:  1.  Follow-up: ,ake sure patient has finished librium   2.  Labs / imaging needed at time of follow-up: none  3.  Pending labs/ test needing follow-up: none  Follow-up Appointments:  He was instructed to follow-up with his primary care physician.  Patient did not stay on if to set up further follow-up appointments.  Hospital Course by problem list: 1.  ETO H use disorder with withdrawal -patient presented after recently being seen at [facility] for similar reason.  He was discharged on Ativan taper but did not take this and continued to drink after being discharged.  He does have previous history of benzodiazepine use disorder that may have complicated the situation.  Patient was loaded with Librium and monitor on CIWA.  Patient had no further withdrawal activity once being admitted.  Patient stated that he needed to get out of the hospital soon as he could for his daughters surgery.  Once patient was stable for discharge, he was given a Librium taper and instructed to follow-up with  outpatient rehab and counseled on the importance of not using alcohol or benzodiazepines on this medication as it can cause serious side effects including death.  Discharge Vitals:   BP 128/85 (BP Location: Right Arm)   Pulse 72   Temp 98.3 F (36.8 C) (Oral)   Resp 18   Ht 5\' 7"  (1.702 m)   Wt 75 kg   SpO2 96%   BMI 25.90 kg/m   Pertinent Labs, Studies, and Procedures:  CBC Latest Ref Rng & Units 01/29/2020 01/28/2020 01/21/2020  WBC 4.0 - 10.5 K/uL 4.7 6.4 4.8  Hemoglobin 13.0 - 17.0 g/dL 15.2 16.5 15.7  Hematocrit 39.0 - 52.0 % 44.0 47.4 45.9  Platelets 150 - 400 K/uL 230 285 206   CMP Latest Ref Rng & Units 01/29/2020 01/28/2020 01/22/2020  Glucose 70 - 99 mg/dL 91 99 108(H)  BUN 6 - 20 mg/dL 8 11 9   Creatinine 0.61 - 1.24 mg/dL 0.82 0.89 1.04  Sodium 135 - 145 mmol/L 140 140 139  Potassium 3.5 - 5.1 mmol/L 3.9 3.8 3.4(L)  Chloride 98 - 111 mmol/L 106 98 98  CO2 22 - 32 mmol/L 25 28 31   Calcium 8.9 - 10.3 mg/dL 8.5(L) 9.4 9.1  Total Protein 6.5 - 8.1 g/dL 6.7 5.7(L) -  Total Bilirubin 0.3 - 1.2 mg/dL 1.0 0.8 -  Alkaline Phos 38 - 126 U/L 63 53 -  AST 15 - 41 U/L 20 18 -  ALT 0 - 44 U/L 15 12 -  Discharge Instructions: Discharge Instructions    Diet - low sodium heart healthy   Complete by: As directed    Discharge instructions   Complete by: As directed    Adam [name],   It was a pleasure taking care of you here in the hospital.  As we discussed during your stay, we believe the seizures you had was due to withdrawal from both alcohol and benzodiazepine.  Here is my recommendations moving forward.  I will send you out on a Librium taper  Day 1: Librium 25 mg 3 times a day Day 2 Librium 25 mg 2 times a day Day 3 Librium 1 tab  Take Care!   Increase activity slowly   Complete by: As directed       Signed: Dellia Cloud, MD 01/29/2020, 7:30 AM   Pager: [phone]

## 2020-01-29 NOTE — TOC Initial Note (Signed)
Transition of Care (TOC) - Initial/Assessment Note    Patient Details  Name: Adam [name]. MRN: [id number] Date of Birth: July 03, 1986  Transition of Care [facility]) CM/SW Contact:    Golda Acre, RN Phone Number: 01/29/2020, 9:56 AM  Clinical Narrative:                 Unable to give resources at this time patient is not awake and has not been receptive.  Expected Discharge Plan: Home/Self Care Barriers to Discharge: Continued Medical Work up   Patient Goals and CMS Choice        Expected Discharge Plan and [name] Expected Discharge Plan: Home/Self Care In-house Referral: Clinical Social Work     Living arrangements for the past 2 months: Single Family Home                                      Prior Living Arrangements/[name] Living arrangements for the past 2 months: Single Family Home Lives with:: Self                   Activities of Daily Living Home Assistive Devices/Equipment: None ADL Screening (condition at time of admission) Patient's cognitive ability adequate to safely complete daily activities?: Yes Is the patient deaf or have difficulty hearing?: No Does the patient have difficulty seeing, even when wearing glasses/contacts?: No Does the patient have difficulty concentrating, remembering, or making decisions?: No Patient able to express need for assistance with ADLs?: Yes Does the patient have difficulty dressing or bathing?: No Independently performs ADLs?: Yes (appropriate for developmental age) Does the patient have difficulty walking or climbing stairs?: No Weakness of Legs: None Weakness of Arms/Hands: None  Permission Sought/Granted                  Emotional Assessment              Admission diagnosis:  Alcohol withdrawal delirium (HCC) [F10.231] Chest pain [R07.9] Alcohol withdrawal seizure with complication (HCC) [M09.470, R56.9] Patient Active Problem List   Diagnosis Date Noted  . Alcohol  withdrawal delirium (HCC) 01/29/2020  . Alcohol withdrawal seizure with complication, with unspecified complication (HCC) 01/28/2020  . Seizure (HCC) 01/21/2020  . Alcohol withdrawal seizure (HCC) 11/10/2019  . Benzodiazepine withdrawal (HCC) 11/10/2019  . Hypokalemia 11/10/2019  . Adjustment disorder with depressed mood 11/10/2019  . Alcohol withdrawal (HCC) 09/24/2019  . Chest pain 09/24/2019  . Alcohol withdrawal seizure without complication (HCC)   . Pneumothorax 04/28/2019  . Essential hypertension 06/25/2010   PCP:  Patient, No Pcp Per Pharmacy:   [facility] [address] ([address] [place] - [address] [place] ([place]) [place] [address] Phone: [phone] Fax: [phone]  [facility] [place], [place] - [address] [place] [address] Phone: [phone] Fax: [phone]     Social Determinants of Health (SDOH) Interventions    Readmission Risk Interventions No flowsheet data found.

## 2020-01-29 NOTE — Progress Notes (Signed)
PROGRESS NOTE  Renae Gloss [employer].  ZJQ:[id number] DOB: December 13, 1985 DOA: 01/28/2020 PCP: Patient, No Pcp Per   Brief Narrative: Renae Gloss [place] [name] is a 34 y.o. male with a history of alcohol abuse and withdrawal seizures, and HTN who presented to the ED 3/8 with multiple seizures while withdrawing from alcohol. He had been admitted recently for similar presentation, discharged 3/2 on librium taper but did not have a license to present to the pharmacy to actually get this medication. He has a desire to stop drinking. He had 2 seizures at home and an additional seizure in the ED prompting admission.   Assessment & Plan: Active Problems:   Essential hypertension   Alcohol withdrawal (HCC)   Alcohol withdrawal seizure ([place])   Benzodiazepine withdrawal ([place])   Seizure ([place])   Alcohol withdrawal seizure with complication, with unspecified complication ([place])   Alcohol withdrawal delirium ([place])  Alcohol abuse with withdrawal complicated by seizures:  - Continue CIWA protocol, will start librium taper and clonidine as below.  - Seizure precautions - Monitor in SDU until no seizure x24 hrs.  - Empiric thiamine, MVM - TOC consulted for resource provision  HTN:  - BP elevation worsened by withdrawal, start clonidine in lieu of lisinopril for now.  Hypophosphatemia:  - Supplement  DVT prophylaxis: Lovenox Code Status: Full Family Communication: None at bedside Disposition Plan: Likely to return home once stable from alcohol withdrawal perspective, will monitor for at least 24 hours from last seizure.   Consultants:   None  Procedures:   None  Antimicrobials:  None   Subjective: No seizures this morning, ready to start a diet, no chest pain or palpitations.  Objective: Vitals:   01/29/20 0956 01/29/20 1000 01/29/20 1100 01/29/20 1200  BP: (!) 168/108 (!) 151/92 (!) 114/57 115/67  Pulse:  75 66 66  Resp:  12 13   Temp:      TempSrc:      SpO2:  97% 95% 97%    Weight:      Height:        Intake/Output Summary (Last 24 hours) at 01/29/2020 1242 Last data filed at 01/29/2020 0845 Gross per 24 hour  Intake 3080.9 ml  Output --  Net 3080.9 ml   Filed Weights   01/28/20 1951 01/29/20 0215  Weight: 74.8 kg 73.8 kg   Gen: 34 y.o. male in no distress Pulm: Non-labored breathing room air. Clear to auscultation bilaterally.  CV: Regular rate and rhythm. No murmur, rub, or gallop. No JVD, no pedal edema. GI: Abdomen soft, non-tender, non-distended, with normoactive bowel sounds. No organomegaly or masses felt. Ext: Warm, no deformities Skin: No rashes, lesions or ulcers, mildly diaphoretic Neuro: Alert and oriented. No tremor or focal neurological deficits. Psych: Judgement and insight appear normal. Mood anxious & affect appropriate.   Data Reviewed: I have personally reviewed following labs and imaging studies  CBC: Recent Labs  Lab 01/28/20 2027 01/29/20 0210  WBC 6.4 4.7  HGB 16.5 15.2  HCT 47.4 44.0  MCV 91.5 94.6  PLT 285 240   Basic Metabolic Panel: Recent Labs  Lab 01/28/20 2027 01/28/20 2300 01/29/20 0210  NA 140  --  140  K 3.8  --  3.9  CL 98  --  106  CO2 28  --  25  GLUCOSE 99  --  91  BUN 11  --  8  CREATININE 0.89  --  0.82  CALCIUM 9.4  --  8.5*  MG  --  1.5* 2.6*  PHOS  --  2.0* 2.2*   GFR: Estimated Creatinine Clearance: 122 mL/min (by C-G formula based on SCr of 0.82 mg/dL). Liver Function Tests: Recent Labs  Lab 01/28/20 2300 01/29/20 0210  AST 18 20  ALT 12 15  ALKPHOS 53 63  BILITOT 0.8 1.0  PROT 5.7* 6.7  ALBUMIN 3.4* 4.0   No results for input(s): LIPASE, AMYLASE in the last 168 hours. No results for input(s): AMMONIA in the last 168 hours. Coagulation Profile: Recent Labs  Lab 01/28/20 2315  INR 1.0   Cardiac Enzymes: No results for input(s): CKTOTAL, CKMB, CKMBINDEX, TROPONINI in the last 168 hours. BNP (last 3 results) No results for input(s): PROBNP in the last 8760  hours. HbA1C: No results for input(s): HGBA1C in the last 72 hours. CBG: Recent Labs  Lab 01/28/20 2008  GLUCAP 90   Lipid Profile: No results for input(s): CHOL, HDL, LDLCALC, TRIG, CHOLHDL, LDLDIRECT in the last 72 hours. Thyroid Function Tests: Recent Labs    01/29/20 0210  TSH 1.290   Anemia Panel: No results for input(s): VITAMINB12, FOLATE, FERRITIN, TIBC, IRON, RETICCTPCT in the last 72 hours. Urine analysis:    Component Value Date/Time   COLORURINE YELLOW 04/28/2019 1759   APPEARANCEUR CLEAR 04/28/2019 1759   LABSPEC 1.029 04/28/2019 1759   PHURINE 7.0 04/28/2019 1759   GLUCOSEU NEGATIVE 04/28/2019 1759   HGBUR NEGATIVE 04/28/2019 1759   BILIRUBINUR NEGATIVE 04/28/2019 1759   KETONESUR NEGATIVE 04/28/2019 1759   PROTEINUR NEGATIVE 04/28/2019 1759   NITRITE NEGATIVE 04/28/2019 1759   LEUKOCYTESUR NEGATIVE 04/28/2019 1759   Recent Results (from the past 240 hour(s))  SARS CORONAVIRUS 2 (TAT 6-24 HRS) Nasopharyngeal Nasopharyngeal Swab     Status: None   Collection Time: 01/21/20  9:35 PM   Specimen: Nasopharyngeal Swab  Result Value Ref Range Status   SARS Coronavirus 2 NEGATIVE NEGATIVE Final    Comment: (NOTE) SARS-CoV-2 target nucleic acids are NOT DETECTED. The SARS-CoV-2 RNA is generally detectable in upper and lower respiratory specimens during the acute phase of infection. Negative results do not preclude SARS-CoV-2 infection, do not rule out co-infections with other pathogens, and should not be used as the sole basis for treatment or other patient management decisions. Negative results must be combined with clinical observations, patient history, and epidemiological information. The expected result is Negative. Fact Sheet for Patients: HairSlick.no Fact Sheet for Healthcare Providers: quierodirigir.com This test is not yet approved or cleared by the [place] FDA and  has been authorized for  detection and/or diagnosis of SARS-CoV-2 by FDA under an Emergency Use Authorization (EUA). This EUA will remain  in effect (meaning this test can be used) for the duration of the COVID-19 declaration under Section 56 4(b)(1) of the Act, 21 U.S.C. section 360bbb-3(b)(1), unless the authorization is terminated or revoked sooner. Performed at [facility], [address], [place], [place] [address]   SARS CORONAVIRUS 2 (TAT 6-24 HRS) Nasopharyngeal Nasopharyngeal Swab     Status: None   Collection Time: 01/28/20 10:34 PM   Specimen: Nasopharyngeal Swab  Result Value Ref Range Status   SARS Coronavirus 2 NEGATIVE NEGATIVE Final    Comment: (NOTE) SARS-CoV-2 target nucleic acids are NOT DETECTED. The SARS-CoV-2 RNA is generally detectable in upper and lower respiratory specimens during the acute phase of infection. Negative results do not preclude SARS-CoV-2 infection, do not rule out co-infections with other pathogens, and should not be used as the sole basis for treatment or  other patient management decisions. Negative results must be combined with clinical observations, patient history, and epidemiological information. The expected result is Negative. Fact Sheet for Patients: HairSlick.no Fact Sheet for Healthcare Providers: quierodirigir.com This test is not yet approved or cleared by the [place] FDA and  has been authorized for detection and/or diagnosis of SARS-CoV-2 by FDA under an Emergency Use Authorization (EUA). This EUA will remain  in effect (meaning this test can be used) for the duration of the COVID-19 declaration under Section 56 4(b)(1) of the Act, 21 U.S.C. section 360bbb-3(b)(1), unless the authorization is terminated or revoked sooner. Performed at [facility], [address], [place], [place] [address]   MRSA PCR Screening     Status: None   Collection Time: 01/29/20  1:51 AM   Specimen:  Nasal Mucosa; Nasopharyngeal  Result Value Ref Range Status   MRSA by PCR NEGATIVE NEGATIVE Final    Comment:        The GeneXpert MRSA Assay (FDA approved for NASAL specimens only), is one component of a comprehensive MRSA colonization surveillance program. It is not intended to diagnose MRSA infection nor to guide or monitor treatment for MRSA infections. Performed at [facility], [address] [place], [place] [address]       Radiology Studies: DG Chest Port 1 View  Result Date: 01/28/2020 CLINICAL DATA:  Witnessed seizure EXAM: PORTABLE CHEST 1 VIEW COMPARISON:  01/21/2020 FINDINGS: The heart size and mediastinal contours are within normal limits. Both lungs are clear. The visualized skeletal structures are unremarkable. IMPRESSION: No active disease. Electronically Signed   By: Jasmine Pang M.D.   On: 01/28/2020 20:46    Scheduled Meds: . Chlorhexidine Gluconate Cloth  6 each Topical Daily  . cloNIDine  0.1 mg Oral TID  . folic acid  1 mg Oral Daily  . multivitamin with minerals  1 tablet Oral Daily  . potassium & sodium phosphates  1 packet Oral TID WC & HS  . thiamine  100 mg Oral Daily   Or  . thiamine  100 mg Intravenous Daily   Continuous Infusions:   LOS: 0 days   Time spent: 35 minutes.  Tyrone Nine, MD [facility] [email or web address] 01/29/2020, 12:42 PM

## 2020-01-30 DIAGNOSIS — R079 Chest pain, unspecified: Secondary | ICD-10-CM

## 2020-01-30 LAB — COMPREHENSIVE METABOLIC PANEL
ALT: 13 U/L (ref 0–44)
AST: 19 U/L (ref 15–41)
Albumin: 3.8 g/dL (ref 3.5–5.0)
Alkaline Phosphatase: 60 U/L (ref 38–126)
Anion gap: 9 (ref 5–15)
BUN: 10 mg/dL (ref 6–20)
CO2: 25 mmol/L (ref 22–32)
Calcium: 9.3 mg/dL (ref 8.9–10.3)
Chloride: 102 mmol/L (ref 98–111)
Creatinine, Ser: 1.03 mg/dL (ref 0.61–1.24)
GFR calc Af Amer: >60 mL/min (ref >60)
GFR calc non Af Amer: >60 mL/min (ref >60)
Glucose, Bld: 108 mg/dL — ABNORMAL HIGH (ref 70–99)
Potassium: 4.8 mmol/L (ref 3.5–5.1)
Sodium: 136 mmol/L (ref 135–145)
Total Bilirubin: 1.3 mg/dL — ABNORMAL HIGH (ref 0.3–1.2)
Total Protein: 6.2 g/dL — ABNORMAL LOW (ref 6.5–8.1)

## 2020-01-30 LAB — PHOSPHORUS: Phosphorus: 2.8 mg/dL (ref 2.5–4.6)

## 2020-01-30 LAB — MAGNESIUM: Magnesium: 2.1 mg/dL (ref 1.7–2.4)

## 2020-01-30 MED ORDER — LISINOPRIL 20 MG PO TABS: 10.0000 mg | ORAL_TABLET | Freq: Every day | ORAL | 0 refills | Status: DC

## 2020-01-30 MED ORDER — THIAMINE HCL 100 MG PO TABS: 100.0000 mg | ORAL_TABLET | Freq: Every day | ORAL | Status: DC

## 2020-01-30 MED ORDER — ADULT MULTIVITAMIN W/MINERALS CH: 1.0000 | ORAL_TABLET | Freq: Every day | ORAL | Status: DC

## 2020-01-30 MED ORDER — ONDANSETRON HCL 4 MG PO TABS: 4.0000 mg | ORAL_TABLET | Freq: Four times a day (QID) | ORAL | 0 refills | Status: DC | PRN

## 2020-01-30 MED ORDER — TRAZODONE HCL 100 MG PO TABS: 100.0000 mg | ORAL_TABLET | Freq: Every day | ORAL | 0 refills | Status: DC

## 2020-01-30 MED ORDER — FOLIC ACID 1 MG PO TABS: 1.0000 mg | ORAL_TABLET | Freq: Every day | ORAL | Status: DC

## 2020-01-30 MED ORDER — CHLORDIAZEPOXIDE HCL 25 MG PO CAPS: 25.0000 mg | ORAL_CAPSULE | Freq: Three times a day (TID) | ORAL | 0 refills | Status: DC | PRN

## 2020-01-30 MED FILL — ONDANSETRON HCL 4 MG TABLET: 4 | 5 days supply | Qty: 20 | Fill #0

## 2020-01-30 MED FILL — CHLORDIAZEPOXIDE 25 MG CAP: 25 | 2 days supply | Qty: 6 | Fill #0

## 2020-01-30 NOTE — TOC Initial Note (Signed)
Transition of Care (TOC) - Initial/Assessment Note    Patient Details  Name: Adam [name]. MRN: [id number] Date of Birth: [birth year]  Transition of Care [facility]) CM/SW Contact:    Golda Acre, RN Phone Number: 01/30/2020, 11:05 AM  Clinical Narrative:                 dcd to home with self care   Expected Discharge Plan: Home/Self Care Barriers to Discharge: Barriers Resolved   Patient Goals and CMS Choice        Expected Discharge Plan and [name] Expected Discharge Plan: Home/Self Care In-house Referral: Clinical Social Work     Living arrangements for the past 2 months: Single Family Home Expected Discharge Date: 01/30/20                                    Prior Living Arrangements/[name] Living arrangements for the past 2 months: Single Family Home Lives with:: Self                   Activities of Daily Living Home Assistive Devices/Equipment: None ADL Screening (condition at time of admission) Patient's cognitive ability adequate to safely complete daily activities?: Yes Is the patient deaf or have difficulty hearing?: No Does the patient have difficulty seeing, even when wearing glasses/contacts?: No Does the patient have difficulty concentrating, remembering, or making decisions?: No Patient able to express need for assistance with ADLs?: Yes Does the patient have difficulty dressing or bathing?: No Independently performs ADLs?: Yes (appropriate for developmental age) Does the patient have difficulty walking or climbing stairs?: No Weakness of Legs: None Weakness of Arms/Hands: None  Permission Sought/Granted                  Emotional Assessment              Admission diagnosis:  Alcohol withdrawal delirium (HCC) [F10.231] Chest pain [R07.9] Alcohol withdrawal seizure with complication (HCC) [O35.009, R56.9] Patient Active Problem List   Diagnosis Date Noted  . Alcohol withdrawal delirium (HCC) 01/29/2020   . Alcohol withdrawal seizure with complication, with unspecified complication (HCC) 01/28/2020  . Seizure (HCC) 01/21/2020  . Alcohol withdrawal seizure (HCC) 11/10/2019  . Benzodiazepine withdrawal (HCC) 11/10/2019  . Hypokalemia 11/10/2019  . Adjustment disorder with depressed mood 11/10/2019  . Alcohol withdrawal (HCC) 09/24/2019  . Chest pain 09/24/2019  . Alcohol withdrawal seizure without complication (HCC)   . Pneumothorax 04/28/2019  . Essential hypertension 06/25/2010   PCP:  Patient, No Pcp Per Pharmacy:   [facility] [address] ([address] [place] - [address] [place] [address] Phone: [phone] Fax: [phone]  [facility] [place], [place] - [address] [place] [address] Phone: [phone] Fax: [phone]  [facility] - [place], [place] - [address] [place] [address] [place] [address] Phone: [phone] Fax: [phone]     Social Determinants of Health (SDOH) Interventions    Readmission Risk Interventions No flowsheet data found.
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
## 2020-08-29 DIAGNOSIS — R710 Precipitous drop in hematocrit: Secondary | ICD-10-CM | POA: Insufficient documentation

## 2020-11-11 DIAGNOSIS — S022XXA Fracture of nasal bones, initial encounter for closed fracture: Secondary | ICD-10-CM | POA: Insufficient documentation

## 2020-12-01 ENCOUNTER — Other Ambulatory Visit: Payer: Self-pay

## 2020-12-03 ENCOUNTER — Ambulatory Visit: Payer: Self-pay | Admitting: Internal Medicine

## 2020-12-03 DIAGNOSIS — Z0289 Encounter for other administrative examinations: Secondary | ICD-10-CM

## 2021-02-17 ENCOUNTER — Emergency Department (HOSPITAL_COMMUNITY)
Admission: EM | Admit: 2021-02-17 | Discharge: 2021-02-17 | Disposition: A | Discharge status: Home / Self Care | Payer: Self-pay | Attending: Emergency Medicine | Admitting: Emergency Medicine

## 2021-02-17 ENCOUNTER — Other Ambulatory Visit: Payer: Self-pay

## 2021-02-17 ENCOUNTER — Encounter (HOSPITAL_COMMUNITY): Payer: Self-pay

## 2021-02-17 ENCOUNTER — Emergency Department (HOSPITAL_COMMUNITY): Payer: Self-pay

## 2021-02-17 DIAGNOSIS — L03113 Cellulitis of right upper limb: Secondary | ICD-10-CM | POA: Insufficient documentation

## 2021-02-17 DIAGNOSIS — M6790 Unspecified disorder of synovium and tendon, unspecified site: Secondary | ICD-10-CM

## 2021-02-17 DIAGNOSIS — Z87891 Personal history of nicotine dependence: Secondary | ICD-10-CM | POA: Insufficient documentation

## 2021-02-17 DIAGNOSIS — L03119 Cellulitis of unspecified part of limb: Secondary | ICD-10-CM

## 2021-02-17 DIAGNOSIS — Z79899 Other long term (current) drug therapy: Secondary | ICD-10-CM | POA: Insufficient documentation

## 2021-02-17 DIAGNOSIS — I10 Essential (primary) hypertension: Secondary | ICD-10-CM | POA: Insufficient documentation

## 2021-02-17 MED ORDER — AMOXICILLIN-POT CLAVULANATE 875-125 MG PO TABS: 1.0000 | ORAL_TABLET | Freq: Two times a day (BID) | ORAL | 0 refills | Status: AC

## 2021-02-17 MED ORDER — LIDOCAINE HCL 2 % IJ SOLN
10.0000 mL | Freq: Once | INTRAMUSCULAR | Status: AC
  Administered 2021-02-17: 200 mg
  Filled 2021-02-17: qty 10
  Filled 2021-02-17: qty 20

## 2021-02-17 MED ORDER — BACITRACIN ZINC 500 UNIT/GM EX OINT
TOPICAL_OINTMENT | Freq: Once | CUTANEOUS | Status: AC
  Administered 2021-02-17: 1 via TOPICAL
  Filled 2021-02-17: qty 0.9

## 2021-02-17 NOTE — ED Triage Notes (Signed)
Patient complains of right hand middle finger pain after cutting same last week with glass. Appears limited rom with finger. Area red and tender around wound

## 2021-02-17 NOTE — ED Provider Notes (Signed)
[facility] EMERGENCY DEPARTMENT Provider Note   CSN: [id number] Arrival date & time: 02/17/21  1138     History CC: hand injury  Wilmon Conover Jinkins Montez Hageman. is a 35 y.o. male with hx of HTN, eating disorder, seizure due to alcohol withdrawal who presents with hand injury. He states that he initially had a small laceration over the dorsum of his R 3rd metacarpal-phalangeal joint while changing a broken light bulb 5 days ago. It became swollen, erythematous, painful, and drained green discharge. He cleaned the wound with peroxide, Neo-sporin, and a "wound cleaner" product and had improvement, states the redness and swelling have decreased. 2 days ago he was playing with his daughter and manipulating a stuff animal when he felt a pop over the 3rd metacarpal-phalangeal joint and since then has had weakness of the joint. Pain is worse with extension. No fever, chills, nausea, or other signs of systemic illness. No history of injury or surgery to the hand.  Past Medical History:  Diagnosis Date  . Eating disorder    history of bulemia  . HYPERTENSION 06/25/2010  . Hypertension     Patient Active Problem List   Diagnosis Date Noted  . Alcohol withdrawal delirium (HCC) 01/29/2020  . Alcohol withdrawal seizure with complication, with unspecified complication (HCC) 01/28/2020  . Seizure (HCC) 01/21/2020  . Alcohol withdrawal seizure (HCC) 11/10/2019  . Benzodiazepine withdrawal (HCC) 11/10/2019  . Hypokalemia 11/10/2019  . Adjustment disorder with depressed mood 11/10/2019  . Alcohol withdrawal (HCC) 09/24/2019  . Chest pain 09/24/2019  . Alcohol withdrawal seizure without complication (HCC)   . Pneumothorax 04/28/2019  . Essential hypertension 06/25/2010    History reviewed. No pertinent surgical history.     Family History  Problem Relation Age of Onset  . Cancer Mother        lung  . Hypertension Father     Social History   Tobacco Use  . Smoking status:  Former Games developer  . Smokeless tobacco: Current User  Substance Use Topics  . Alcohol use: Yes    Alcohol/week: 2.0 - 3.0 standard drinks    Types: 2 - 3 Standard drinks or equivalent per week  . Drug use: Not Currently    Home Medications Prior to Admission medications   Medication Sig Start Date End Date Taking? Authorizing Provider  amoxicillin-clavulanate (AUGMENTIN) 875-125 MG tablet Take 1 tablet by mouth 2 (two) times daily for 7 days. 02/17/21 02/24/21 Yes Remo Lipps, MD  lisinopril (ZESTRIL) 20 MG tablet Take 0.5 tablets (10 mg total) by mouth daily. 01/30/20   Alwyn Ren, MD  traZODone (DESYREL) 100 MG tablet Take 1 tablet (100 mg total) by mouth at bedtime. 01/30/20   Alwyn Ren, MD    Allergies    Patient has no known allergies.  Review of Systems   Review of Systems  Constitutional: Negative for chills and fever.  Gastrointestinal: Negative for abdominal pain, nausea and vomiting.  Musculoskeletal: Positive for arthralgias and joint swelling.  All other systems reviewed and are negative.   Physical Exam Updated Vital Signs BP 119/79   Pulse 68   Temp 98.9 F (37.2 C) (Temporal)   Resp 16   SpO2 99%   Physical Exam Vitals and nursing note reviewed.  Constitutional:      Appearance: Normal appearance.  HENT:     Head: Normocephalic and atraumatic.     Right Ear: External ear normal.     Left Ear: External ear normal.  Nose: Nose normal.     Mouth/Throat:     Mouth: Mucous membranes are moist.  Eyes:     Extraocular Movements: Extraocular movements intact.  Cardiovascular:     Rate and Rhythm: Normal rate and regular rhythm.     Pulses: Normal pulses.     Heart sounds: Normal heart sounds. No murmur heard. No friction rub. No gallop.   Pulmonary:     Effort: Pulmonary effort is normal. No respiratory distress.     Breath sounds: Normal breath sounds. No wheezing or rhonchi.  Abdominal:     General: Abdomen is flat.   Musculoskeletal:     Cervical back: Normal range of motion and neck supple.     Comments: Over the dorsum of the right 3rd MCP joint, there is a 1.5cm diameter area of erythema and edema which is mildly tender. There is overlying this a 1cm laceration which is healing and covered with a scab. Unable to express any drainage. No streaking.  There is pain with passive and active ROM of the finger in all directions, but worst with extension.  Thee is mild weakness with extension at that joint. No pain or weakness with flexion of the IP joints.  Skin:    General: Skin is warm and dry.  Neurological:     General: No focal deficit present.     Mental Status: He is alert and oriented to person, place, and time.  Psychiatric:        Mood and Affect: Mood normal.        Behavior: Behavior normal.          ED Results / Procedures / Treatments   Labs (all labs ordered are listed, but only abnormal results are displayed) Labs Reviewed - No data to display  EKG None  Radiology DG Hand Complete Right  Result Date: 02/17/2021 CLINICAL DATA:  Posterior third digit pain EXAM: RIGHT HAND - COMPLETE 3+ VIEW COMPARISON:  None. FINDINGS: There is no evidence of fracture or dislocation. There is no evidence of arthropathy or other focal bone abnormality. Soft tissue swelling seen along the dorsum of the hand. IMPRESSION: Negative. Electronically Signed   By: Jonna Clark M.D.   On: 02/17/2021 12:31    Procedures .Marland KitchenIncision and Drainage  Date/Time: 02/17/2021 4:31 PM Performed by: Remo Lipps, MD Authorized by: Arby Barrette, MD   Consent:    Consent obtained:  Verbal   Consent given by:  Patient   Risks, benefits, and alternatives were discussed: yes     Risks discussed:  Bleeding, incomplete drainage and pain   Alternatives discussed:  No treatment Universal protocol:    Procedure explained and questions answered to patient or proxy's satisfaction: yes     Relevant documents  present and verified: yes     Imaging studies available: yes     Required blood products, implants, devices, and special equipment available: no     Site/side marked: yes     Immediately prior to procedure, a time out was called: yes     Patient identity confirmed:  Verbally with patient Location:    Type:  Abscess   Size:  1cc   Location:  Upper extremity   Upper extremity location:  Hand   Hand location:  R hand Pre-procedure details:    Skin preparation:  Chlorhexidine with alcohol Sedation:    Sedation type:  None Anesthesia:    Anesthesia method:  Local infiltration   Local anesthetic:  Lidocaine 2% w/o  epi Procedure type:    Complexity:  Simple Procedure details:    Ultrasound guidance: no     Needle aspiration: no     Incision types:  Single straight   Incision depth:  Dermal   Wound management:  Probed and deloculated and irrigated with saline   Drainage:  Bloody and purulent   Drainage amount:  Scant   Wound treatment:  Wound left open   Packing materials:  None Post-procedure details:    Procedure completion:  Tolerated well, no immediate complications    Medications Ordered in ED Medications  lidocaine (XYLOCAINE) 2 % (with pres) injection 200 mg (200 mg Infiltration Given 02/17/21 1545)  bacitracin ointment (1 application Topical Given 02/17/21 1545)    ED Course  I have reviewed the triage vital signs and the nursing notes.  Pertinent labs & imaging results that were available during my care of the patient were reviewed by me and considered in my medical decision making (see chart for details).    MDM Rules/Calculators/A&P                          Patient presented with hand infection after injury 5 days ago which improved with wound care at home. 2 days ago was using his hand when he had a popping sensation "like a string snapping," and since then has had pain and weakness of flexion at the R MCP. Suspected partial tendon injury vs pain from overlying  infection, as he has good but painful ROM and mild weakness at the MCP which could be secondary to pain. Consulted with hand surgery, who recommended draining the small fluid collection, starting antibiotics, then follow up in their clinic for possible tendon injury. The wound was incised and drained with small amount of purulent drainage, irrigated with normal saline. Left open and given wound care instructions. Buddy taped per hand surgery until time of follow up. Discharged with Augmentin.   Final Clinical Impression(s) / ED Diagnoses Final diagnoses:  Cellulitis of hand  Tendinopathy    Rx / DC Orders ED Discharge Orders         Ordered    amoxicillin-clavulanate (AUGMENTIN) 875-125 MG tablet  2 times daily        02/17/21 1528           Remo Lipps, MD 02/17/21 1636    Arby Barrette, MD 02/17/21 1652

## 2021-02-17 NOTE — ED Notes (Signed)
Discharge paperwork given to pt along with prescriptions. Pt agreeable to discharge and understands instructions. Esignature pad not working. 

## 2021-02-17 NOTE — Discharge Instructions (Signed)
Mr. Gosse, it was a pleasure taking care of you. We incised and drained your hand wound. If you are seeing the hand surgeon later than Thursday, please wash it under running water to irrigate it once daily. Keep it covered. You can apply bacitracin ointment.   We are prescribing an antibiotic, Augmentin, to take twice daily for 1 week.  Please follow up with the hand surgeon, Dr. Janee Morn. Keep the finger buddy taped until then.

## 2021-02-17 NOTE — Consult Note (Addendum)
Reason for Consult:MCP laceration Referring Physician: Stanton Kidney Time called: 1454 Time at bedside: 1500   Ledell Peoples Bader Montez Hageman. is an 35 y.o. male.  HPI: Javarious cut his hand on a glass bulb about 5d ago. He though it became infected and treated it with H2O2 at home and it was improving. 2d ago he was playing with his daughter and felt it "pop". He had immediate increased pain into his long finger and down his hand. After this he noted weakness of that finger. He denies any fevers, chills, sweats, N/V or prior issues. 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  Past Medical History:  Diagnosis Date  . Eating disorder    history of bulemia  . HYPERTENSION 06/25/2010  . Hypertension     History reviewed. No pertinent surgical history.  Family History  Problem Relation Age of Onset  . Cancer Mother        lung  . Hypertension Father     Social History:  reports that he has quit smoking. He uses smokeless tobacco. He reports current alcohol use of about 2.0 - 3.0 standard drinks of alcohol per week. He reports previous drug use.  Allergies: No Known Allergies  Medications: I have reviewed the patient's current medications.  No results found for this or any previous visit (from the past 48 hour(s)).  DG Hand Complete Right  Result Date: 02/17/2021 CLINICAL DATA:  Posterior third digit pain EXAM: RIGHT HAND - COMPLETE 3+ VIEW COMPARISON:  None. FINDINGS: There is no evidence of fracture or dislocation. There is no evidence of arthropathy or other focal bone abnormality. Soft tissue swelling seen along the dorsum of the hand. IMPRESSION: Negative. Electronically Signed   By: Jonna Clark M.D.   On: 02/17/2021 12:31    Review of Systems  Constitutional: Negative for chills, diaphoresis and fever.  HENT: Negative for ear discharge, ear pain, hearing loss and tinnitus.   Eyes: Negative for photophobia and pain.  Respiratory: Negative for cough and shortness of breath.    Cardiovascular: Negative for chest pain.  Gastrointestinal: Negative for abdominal pain, nausea and vomiting.  Genitourinary: Negative for dysuria, flank pain, frequency and urgency.  Musculoskeletal: Positive for arthralgias (Right 3rd finger/hand). Negative for back pain, myalgias and neck pain.  Neurological: Negative for dizziness and headaches.  Hematological: Does not bruise/bleed easily.  Psychiatric/Behavioral: The patient is not nervous/anxious.    Blood pressure 124/83, pulse 72, temperature 98.2 F (36.8 C), temperature source Oral, resp. rate 20, SpO2 99 %. Physical Exam Constitutional:      General: He is not in acute distress.    Appearance: He is well-developed. He is not diaphoretic.  HENT:     Head: Normocephalic and atraumatic.  Eyes:     General: No scleral icterus.       Right eye: No discharge.        Left eye: No discharge.     Conjunctiva/sclera: Conjunctivae normal.  Cardiovascular:     Rate and Rhythm: Normal rate and regular rhythm.  Pulmonary:     Effort: Pulmonary effort is normal. No respiratory distress.  Musculoskeletal:     Cervical back: Normal range of motion.     Comments: Right shoulder, elbow, wrist, digits- Healing 0.5cm lac over 3rd MCP joint, mild TTP and fluctuance, 4/5 MCP extension, no instability, no blocks to motion  Sens  Ax/R/M/U intact  Mot   Ax/ R/ PIN/ M/ AIN/ U intact  Rad 2+  Skin:  General: Skin is warm and dry.  Neurological:     Mental Status: He is alert.  Psychiatric:        Behavior: Behavior normal.     Assessment/Plan: Right 3rd MCP injury -- Plan Augmentin x7d with f/u with Dr. Janee Morn. Office to arrange follow up. Buddy tape to ring finger. Recommend I&D of MCP joint.     Freeman Caldron, PA-C Orthopedic Surgery [phone] 02/17/2021, 3:14 PM    5-day old dorsal lac overlying R LF MCP from broken bulb.  Had concerns because he felt a pop today.    81mm scab on oblique lac overlying dorsal aspect  of LF MCP, mild edema, minimal erythema, full relatively painless MCP flexion, minimal LF MCP extension lag and increased pain with resisted extension.  I don't think this injury represents disruption of extensor tendon, but perhaps incomplete injury.  Also low liklihood of significant infection.  ED will open wound by removing scab and spreading to allow any fluid to drain.  Rec oral antibiotics for a few days, BT to RF, and office to arrange f/u with me for later this week.  Neil Crouch, MD  Hand Surgery

## 2021-02-17 NOTE — ED Notes (Signed)
I did a buddy tape of pt's 3rd and 4th digit of right hand as per MD order.

## 2021-06-27 DIAGNOSIS — F419 Anxiety disorder, unspecified: Secondary | ICD-10-CM | POA: Insufficient documentation

## 2021-12-26 ENCOUNTER — Encounter (HOSPITAL_COMMUNITY): Payer: Self-pay

## 2021-12-26 ENCOUNTER — Emergency Department (HOSPITAL_COMMUNITY)
Admission: EM | Admit: 2021-12-26 | Discharge: 2021-12-26 | Disposition: A | Discharge status: Home / Self Care | Payer: No Typology Code available for payment source | Attending: Emergency Medicine | Admitting: Emergency Medicine

## 2021-12-26 ENCOUNTER — Emergency Department (HOSPITAL_COMMUNITY): Payer: No Typology Code available for payment source

## 2021-12-26 ENCOUNTER — Other Ambulatory Visit: Payer: Self-pay

## 2021-12-26 DIAGNOSIS — S2242XA Multiple fractures of ribs, left side, initial encounter for closed fracture: Secondary | ICD-10-CM | POA: Insufficient documentation

## 2021-12-26 DIAGNOSIS — Y92410 Unspecified street and highway as the place of occurrence of the external cause: Secondary | ICD-10-CM | POA: Insufficient documentation

## 2021-12-26 DIAGNOSIS — M79641 Pain in right hand: Secondary | ICD-10-CM | POA: Insufficient documentation

## 2021-12-26 DIAGNOSIS — S299XXA Unspecified injury of thorax, initial encounter: Secondary | ICD-10-CM | POA: Diagnosis present

## 2021-12-26 MED ORDER — HYDROCODONE-ACETAMINOPHEN 5-325 MG PO TABS: 2.0000 | ORAL_TABLET | ORAL | 0 refills | Status: DC | PRN
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
## 2021-12-26 NOTE — ED Notes (Signed)
Patient instructed on incentive spirometry usage and education.
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
## 2022-03-18 ENCOUNTER — Other Ambulatory Visit: Payer: Self-pay

## 2022-03-18 ENCOUNTER — Encounter (HOSPITAL_COMMUNITY): Payer: Self-pay

## 2022-03-18 ENCOUNTER — Emergency Department (HOSPITAL_COMMUNITY): Payer: Self-pay

## 2022-03-18 ENCOUNTER — Inpatient Hospital Stay (HOSPITAL_COMMUNITY)
Admission: EM | Admit: 2022-03-18 | Discharge: 2022-03-20 | DRG: 918 | Disposition: A | Discharge status: Home / Self Care | Payer: Self-pay | Attending: Internal Medicine | Admitting: Internal Medicine

## 2022-03-18 DIAGNOSIS — T510X1A Toxic effect of ethanol, accidental (unintentional), initial encounter: Principal | ICD-10-CM | POA: Diagnosis present

## 2022-03-18 DIAGNOSIS — I959 Hypotension, unspecified: Secondary | ICD-10-CM | POA: Diagnosis present

## 2022-03-18 DIAGNOSIS — Z20822 Contact with and (suspected) exposure to covid-19: Secondary | ICD-10-CM | POA: Diagnosis present

## 2022-03-18 DIAGNOSIS — E860 Dehydration: Secondary | ICD-10-CM | POA: Diagnosis present

## 2022-03-18 DIAGNOSIS — R569 Unspecified convulsions: Secondary | ICD-10-CM | POA: Diagnosis present

## 2022-03-18 DIAGNOSIS — Z8249 Family history of ischemic heart disease and other diseases of the circulatory system: Secondary | ICD-10-CM

## 2022-03-18 DIAGNOSIS — F10229 Alcohol dependence with intoxication, unspecified: Secondary | ICD-10-CM | POA: Diagnosis present

## 2022-03-18 DIAGNOSIS — I10 Essential (primary) hypertension: Secondary | ICD-10-CM | POA: Diagnosis present

## 2022-03-18 DIAGNOSIS — R0789 Other chest pain: Secondary | ICD-10-CM | POA: Diagnosis present

## 2022-03-18 DIAGNOSIS — F10931 Alcohol use, unspecified with withdrawal delirium: Secondary | ICD-10-CM | POA: Diagnosis present

## 2022-03-18 DIAGNOSIS — F10930 Alcohol use, unspecified with withdrawal, uncomplicated: Secondary | ICD-10-CM

## 2022-03-18 DIAGNOSIS — Z801 Family history of malignant neoplasm of trachea, bronchus and lung: Secondary | ICD-10-CM

## 2022-03-18 DIAGNOSIS — N179 Acute kidney failure, unspecified: Secondary | ICD-10-CM | POA: Diagnosis present

## 2022-03-18 DIAGNOSIS — R079 Chest pain, unspecified: Secondary | ICD-10-CM | POA: Diagnosis present

## 2022-03-18 DIAGNOSIS — F17290 Nicotine dependence, other tobacco product, uncomplicated: Secondary | ICD-10-CM | POA: Diagnosis present

## 2022-03-18 LAB — COMPREHENSIVE METABOLIC PANEL
ALT: 26 U/L (ref 0–44)
AST: 34 U/L (ref 15–41)
Albumin: 4.5 g/dL (ref 3.5–5.0)
Alkaline Phosphatase: 52 U/L (ref 38–126)
Anion gap: 14 (ref 5–15)
BUN: 17 mg/dL (ref 6–20)
CO2: 24 mmol/L (ref 22–32)
Calcium: 9.3 mg/dL (ref 8.9–10.3)
Chloride: 98 mmol/L (ref 98–111)
Creatinine, Ser: 1.28 mg/dL — ABNORMAL HIGH (ref 0.61–1.24)
GFR, Estimated: >60 mL/min (ref >60)
Glucose, Bld: 94 mg/dL (ref 70–99)
Potassium: 4 mmol/L (ref 3.5–5.1)
Sodium: 136 mmol/L (ref 135–145)
Total Bilirubin: 0.6 mg/dL (ref 0.3–1.2)
Total Protein: 6.8 g/dL (ref 6.5–8.1)

## 2022-03-18 LAB — CBC WITH DIFFERENTIAL/PLATELET
Abs Immature Granulocytes: 0.01 10*3/uL (ref 0.00–0.07)
Basophils Absolute: 0 10*3/uL (ref 0.0–0.1)
Basophils Relative: 1 %
Eosinophils Absolute: 0 10*3/uL (ref 0.0–0.5)
Eosinophils Relative: 0 %
HCT: 42 % (ref 39.0–52.0)
Hemoglobin: 14.9 g/dL (ref 13.0–17.0)
Immature Granulocytes: 0 %
Lymphocytes Relative: 43 %
Lymphs Abs: 2.5 10*3/uL (ref 0.7–4.0)
MCH: 34.7 pg — ABNORMAL HIGH (ref 26.0–34.0)
MCHC: 35.5 g/dL (ref 30.0–36.0)
MCV: 97.7 fL (ref 80.0–100.0)
Monocytes Absolute: 0.4 10*3/uL (ref 0.1–1.0)
Monocytes Relative: 6 %
Neutro Abs: 2.9 10*3/uL (ref 1.7–7.7)
Neutrophils Relative %: 50 %
Platelets: 224 10*3/uL (ref 150–400)
RBC: 4.3 MIL/uL (ref 4.22–5.81)
RDW: 11.2 % — ABNORMAL LOW (ref 11.5–15.5)
WBC: 5.7 10*3/uL (ref 4.0–10.5)
nRBC: 0 % (ref 0.0–0.2)

## 2022-03-18 LAB — RESP PANEL BY RT-PCR (FLU A&B, COVID) ARPGX2
Influenza A by PCR: NEGATIVE
Influenza B by PCR: NEGATIVE
SARS Coronavirus 2 by RT PCR: NEGATIVE

## 2022-03-18 LAB — LIPASE, BLOOD: Lipase: 41 U/L (ref 11–51)

## 2022-03-18 LAB — MAGNESIUM: Magnesium: 2.1 mg/dL (ref 1.7–2.4)

## 2022-03-18 LAB — ETHANOL: Alcohol, Ethyl (B): 236 mg/dL — ABNORMAL HIGH (ref <10)

## 2022-03-18 LAB — TROPONIN I (HIGH SENSITIVITY): Troponin I (High Sensitivity): 3 ng/L (ref <18)

## 2022-03-18 MED ORDER — LEVETIRACETAM IN NACL 1500 MG/100ML IV SOLN
1500.0000 mg | Freq: Two times a day (BID) | INTRAVENOUS | Status: DC
  Filled 2022-03-18: qty 100

## 2022-03-18 MED ORDER — LORAZEPAM 1 MG PO TABS
0.0000 mg | ORAL_TABLET | Freq: Four times a day (QID) | ORAL | Status: DC
  Administered 2022-03-19: 2 mg via ORAL
  Administered 2022-03-19 – 2022-03-20 (×2): 1 mg via ORAL
  Administered 2022-03-20: 2 mg via ORAL
  Filled 2022-03-18: qty 2
  Filled 2022-03-18: qty 1
  Filled 2022-03-18: qty 2

## 2022-03-18 MED ORDER — LORAZEPAM 2 MG/ML IJ SOLN: 0.0000 mg | Freq: Two times a day (BID) | INTRAMUSCULAR | Status: DC

## 2022-03-18 MED ORDER — LORAZEPAM 1 MG PO TABS: 0.0000 mg | ORAL_TABLET | Freq: Two times a day (BID) | ORAL | Status: DC

## 2022-03-18 MED ORDER — THIAMINE HCL 100 MG/ML IJ SOLN
100.0000 mg | Freq: Every day | INTRAMUSCULAR | Status: DC
Start: 2022-03-18 — End: 2022-03-20
  Administered 2022-03-18: 100 mg via INTRAVENOUS
  Filled 2022-03-18: qty 2

## 2022-03-18 MED ORDER — LORAZEPAM 2 MG/ML IJ SOLN
0.0000 mg | Freq: Four times a day (QID) | INTRAMUSCULAR | Status: DC
  Administered 2022-03-18: 2 mg via INTRAVENOUS
  Administered 2022-03-19: 1 mg via INTRAVENOUS
  Filled 2022-03-18: qty 1
  Filled 2022-03-18: qty 2

## 2022-03-18 MED ORDER — THIAMINE HCL 100 MG PO TABS
100.0000 mg | ORAL_TABLET | Freq: Every day | ORAL | Status: DC
  Administered 2022-03-19 – 2022-03-20 (×2): 100 mg via ORAL
  Filled 2022-03-18 (×2): qty 1

## 2022-03-18 NOTE — ED Triage Notes (Signed)
Per EMS pt refused IV and CBG  ?

## 2022-03-18 NOTE — ED Triage Notes (Signed)
Pt stated he normally drinks about a half gallon of bourbon a day and last drink was yesterday, pt stated his wife is pregnant and he feels ready to quit drinking  ?
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
## 2022-03-18 NOTE — ED Provider Notes (Signed)
?[facility] EMERGENCY DEPARTMENT ?Provider Note ? ? ?CSN: [id number] ?Arrival date & time: 03/18/22  2027 ? ?  ? ?History ? ?Chief Complaint  ?Patient presents with  ? Alcohol Problem  ? Chest Pain  ? ? ?Adam [name]. is a 36 y.o. male. ? ?HPI ?Patient presents with concern for alcohol addiction, seizures, chest pain.  Patient notes a history of intermittent alcohol abuse, recently relapsed.  He typically drinks about 1/5 a day of brown liquor.  Last alcohol was yesterday.  Today the patient was noted to have a seizure, an episode of syncope.  He notes that he is trying to obtain help for this.  At the point the patient also developed chest pain, anterior, nonradiating, tight.  Patient does not have a history of cardiac disease, does have a history of prior alcohol withdrawal delirium with seizures. ?  ? ?Home Medications ?Prior to Admission medications   ?Medication Sig Start Date End Date Taking? Authorizing Provider  ?HYDROcodone-acetaminophen (NORCO/VICODIN) 5-325 MG tablet Take 2 tablets by mouth every 4 (four) hours as needed. 12/26/21   Roemhildt, Lorin T, PA-C  ?lisinopril (ZESTRIL) 20 MG tablet Take 0.5 tablets (10 mg total) by mouth daily. 01/30/20   Alwyn Ren, MD  ?traZODone (DESYREL) 100 MG tablet Take 1 tablet (100 mg total) by mouth at bedtime. 01/30/20   Alwyn Ren, MD  ?   ? ?Allergies    ?Patient has no known allergies.   ? ?Review of Systems   ?Review of Systems  ?Constitutional:   ?     Per HPI, otherwise negative  ?HENT:    ?     Per HPI, otherwise negative  ?Respiratory:    ?     Per HPI, otherwise negative  ?Cardiovascular:   ?     Per HPI, otherwise negative  ?Gastrointestinal:  Negative for vomiting.  ?Endocrine:  ?     Negative aside from HPI  ?Genitourinary:   ?     Neg aside from HPI   ?Musculoskeletal:   ?     Per HPI, otherwise negative  ?Skin: Negative.   ?Neurological:  Positive for seizures. Negative for syncope.  ? ?Physical Exam ?Updated  Vital Signs ?BP 121/80   Pulse 79   Temp 98.3 ?F (36.8 ?C)   Resp 13   SpO2 99%  ?Physical Exam ?Vitals and nursing note reviewed.  ?Constitutional:   ?   General: He is not in acute distress. ?   Appearance: He is well-developed.  ?HENT:  ?   Head: Normocephalic and atraumatic.  ?Eyes:  ?   Conjunctiva/sclera: Conjunctivae normal.  ?Cardiovascular:  ?   Rate and Rhythm: Normal rate and regular rhythm.  ?Pulmonary:  ?   Effort: Pulmonary effort is normal. No respiratory distress.  ?   Breath sounds: No stridor.  ?Abdominal:  ?   General: There is no distension.  ?Skin: ?   General: Skin is warm and dry.  ?Neurological:  ?   Mental Status: He is alert and oriented to person, place, and time.  ? ? ?ED Results / Procedures / Treatments   ?Labs ?(all labs ordered are listed, but only abnormal results are displayed) ?Labs Reviewed  ?RESP PANEL BY RT-PCR (FLU A&B, COVID) [id number]  ?COMPREHENSIVE METABOLIC PANEL  ?ETHANOL  ?LIPASE, BLOOD  ?CBC WITH DIFFERENTIAL/PLATELET  ?MAGNESIUM  ?TROPONIN I (HIGH SENSITIVITY)  ? ? ?EKG ?None ? ?Radiology ?No results found. ? ?Procedures ?Procedures  ? ? ?Medications  Ordered in ED ?Medications  ?LORazepam (ATIVAN) injection 0-4 mg (2 mg Intravenous Given 03/18/22 2114)  ?  Or  ?LORazepam (ATIVAN) tablet 0-4 mg ( Oral See Alternative 03/18/22 2114)  ?LORazepam (ATIVAN) injection 0-4 mg (has no administration in time range)  ?  Or  ?LORazepam (ATIVAN) tablet 0-4 mg (has no administration in time range)  ?thiamine tablet 100 mg (has no administration in time range)  ?  Or  ?thiamine (B-1) injection 100 mg (has no administration in time range)  ? ? ?ED Course/ Medical Decision Making/ A&P ?This patient with a Hx of alcohol abuse, seizures presents to the ED for concern of seizure, syncope, in the context of ongoing alcohol abuse, last yesterday., this involves an extensive number of treatment options, and is a complaint that carries with it a high risk of complications and morbidity.    ? ?The differential diagnosis includes seizure, withdrawal, ACS ? ? ?Social Determinants of Health: ? ?Alcohol abuse ? ?Additional history obtained: ? ?Additional history and/or information obtained from chart review, EMS, notable for HPI as above.  Patient refused IV in route ?Chart review also notable for EEG performed 3 years ago results below: ?IMPRESSION: ?Normal electroencephalogram, awake, asleep and with activation procedures. There are no focal lateralizing or epileptiform features. ? ? ? ? ?After the initial evaluation, orders, including: Labs, monitoring were initiated. ?Almost immediately after the initial evaluation the patient had a seizure requiring IV Ativan. ? ?Patient placed on Cardiac and Pulse-Oximetry Monitors. ?The patient was maintained on a cardiac monitor.  The cardiac monitored showed an rhythm of 80 sinus normal ?The patient was also maintained on pulse oximetry. The readings were typically 100% room air normal ? ? ?On repeat evaluation of the patient worsened ? ?Lab Tests: ? ?I personally interpreted labs.  The pertinent results include: Alcohol 236, creatinine 1.28 ? ?Imaging Studies ordered: ? ?I independently visualized and interpreted imaging which showed head CT unremarkable aside from mild atrophy ?I agree with the radiologist interpretation ? ?Consultations Obtained: ? ?I requested consultation with the neurology and internal medicine,  and discussed lab and imaging findings as well as pertinent plan - they recommend: Neurology recommends IV Keppra, 1500 mg, followed by twice daily 500 mg ? ?Dispostion / Final MDM: ? ?After consideration of the diagnostic results and the patient's response to treatment, will be admitted for monitoring, management.  This adult male with history of alcohol abuse presents with chest pain, seizures, has a seizure in the ED, and given concern for ongoing seizures related to alcohol withdrawal required loading with Keppra, and after conversation with  neurology colleagues, fluid resuscitation initiation of CIWA protocol, provision of Ativan 2 mg, patient was admitted to our internal medicine colleagues for further monitoring, management.  He did complain of chest pain, but his EKG is unremarkable, troponin normal, and risk profile for cardiac disease is minimal. ? ?Final Clinical Impression(s) / ED Diagnoses ?Final diagnoses:  ?Alcohol withdrawal syndrome, with delirium (HCC)  ?Seizure (HCC)  ?CRITICAL CARE ?Performed by: Gerhard Munchobert Trisha Ken ?Total critical care time: 35 minutes ?Critical care time was exclusive of separately billable procedures and treating other patients. ?Critical care was necessary to treat or prevent imminent or life-threatening deterioration. ?Critical care was time spent personally by me on the following activities: development of treatment plan with patient and/or surrogate as well as nursing, discussions with consultants, evaluation of patient's response to treatment, examination of patient, obtaining history from patient or surrogate, ordering and performing treatments and interventions, ordering and  review of laboratory studies, ordering and review of radiographic studies, pulse oximetry and re-evaluation of patient's condition. ? ?  ?Gerhard Munch, MD ?03/18/22 2348 ? ?

## 2022-03-19 ENCOUNTER — Encounter (HOSPITAL_COMMUNITY): Payer: Self-pay | Admitting: Internal Medicine

## 2022-03-19 ENCOUNTER — Observation Stay (HOSPITAL_COMMUNITY): Payer: Self-pay

## 2022-03-19 ENCOUNTER — Observation Stay (HOSPITAL_BASED_OUTPATIENT_CLINIC_OR_DEPARTMENT_OTHER): Payer: Self-pay

## 2022-03-19 DIAGNOSIS — F10930 Alcohol use, unspecified with withdrawal, uncomplicated: Secondary | ICD-10-CM

## 2022-03-19 DIAGNOSIS — I10 Essential (primary) hypertension: Secondary | ICD-10-CM

## 2022-03-19 DIAGNOSIS — R569 Unspecified convulsions: Secondary | ICD-10-CM

## 2022-03-19 DIAGNOSIS — R079 Chest pain, unspecified: Secondary | ICD-10-CM

## 2022-03-19 DIAGNOSIS — F10931 Alcohol use, unspecified with withdrawal delirium: Secondary | ICD-10-CM

## 2022-03-19 LAB — ECHOCARDIOGRAM COMPLETE
AR max vel: 2.91 cm2
AV Area VTI: 2.74 cm2
AV Area mean vel: 2.87 cm2
AV Mean grad: 4 mmHg
AV Peak grad: 7.8 mmHg
Ao pk vel: 1.4 m/s
Area-P 1/2: 3.61 cm2
S' Lateral: 2.2 cm

## 2022-03-19 LAB — RAPID URINE DRUG SCREEN, HOSP PERFORMED
Amphetamines: NOT DETECTED
Barbiturates: NOT DETECTED
Benzodiazepines: NOT DETECTED
Cocaine: NOT DETECTED
Opiates: NOT DETECTED
Tetrahydrocannabinol: NOT DETECTED

## 2022-03-19 LAB — LACTIC ACID, PLASMA
Lactic Acid, Venous: 2 mmol/L (ref 0.5–1.9)
Lactic Acid, Venous: 1.8 mmol/L (ref 0.5–1.9)

## 2022-03-19 LAB — URINALYSIS, COMPLETE (UACMP) WITH MICROSCOPIC
Bilirubin Urine: NEGATIVE
Glucose, UA: NEGATIVE mg/dL
Hgb urine dipstick: NEGATIVE
Ketones, ur: NEGATIVE mg/dL
Leukocytes,Ua: NEGATIVE
Nitrite: NEGATIVE
Protein, ur: 30 mg/dL — AB
Specific Gravity, Urine: 1.016 (ref 1.005–1.030)
pH: 7 (ref 5.0–8.0)

## 2022-03-19 LAB — CK: Total CK: 61 U/L (ref 49–397)

## 2022-03-19 LAB — PHOSPHORUS: Phosphorus: 4.8 mg/dL — ABNORMAL HIGH (ref 2.5–4.6)

## 2022-03-19 LAB — TROPONIN I (HIGH SENSITIVITY): Troponin I (High Sensitivity): 3 ng/L (ref <18)

## 2022-03-19 MED ORDER — MELATONIN 3 MG PO TABS: 3.0000 mg | ORAL_TABLET | Freq: Every day | ORAL | Status: DC

## 2022-03-19 MED ORDER — CHLORDIAZEPOXIDE HCL 25 MG PO CAPS
25.0000 mg | ORAL_CAPSULE | Freq: Three times a day (TID) | ORAL | Status: DC
  Administered 2022-03-19 – 2022-03-20 (×3): 25 mg via ORAL
  Filled 2022-03-19 (×3): qty 1

## 2022-03-19 MED ORDER — SODIUM CHLORIDE 0.9 % IV BOLUS
1000.0000 mL | Freq: Once | INTRAVENOUS | Status: AC
Start: 2022-03-19 — End: 2022-03-19
  Administered 2022-03-19: 1000 mL via INTRAVENOUS

## 2022-03-19 MED ORDER — CHLORDIAZEPOXIDE HCL 5 MG PO CAPS
20.0000 mg | ORAL_CAPSULE | Freq: Three times a day (TID) | ORAL | Status: DC
  Administered 2022-03-19 (×2): 20 mg via ORAL
  Filled 2022-03-19: qty 4
  Filled 2022-03-19: qty 2
  Filled 2022-03-19: qty 4

## 2022-03-19 MED ORDER — LORAZEPAM 1 MG PO TABS
1.0000 mg | ORAL_TABLET | ORAL | Status: DC | PRN
  Administered 2022-03-19: 2 mg via ORAL
  Filled 2022-03-19: qty 2
  Filled 2022-03-19: qty 1

## 2022-03-19 MED ORDER — TRAMADOL HCL 50 MG PO TABS
50.0000 mg | ORAL_TABLET | Freq: Three times a day (TID) | ORAL | Status: DC | PRN
  Administered 2022-03-19: 50 mg via ORAL
  Filled 2022-03-19: qty 1

## 2022-03-19 MED ORDER — SODIUM CHLORIDE 0.9 % IV SOLN: INTRAVENOUS | Status: DC

## 2022-03-19 MED ORDER — POTASSIUM CL IN DEXTROSE 5% 20 MEQ/L IV SOLN
20.0000 meq | INTRAVENOUS | Status: DC
  Administered 2022-03-19: 20 meq via INTRAVENOUS
  Filled 2022-03-19 (×2): qty 1000

## 2022-03-19 MED ORDER — PANTOPRAZOLE SODIUM 40 MG PO TBEC
40.0000 mg | DELAYED_RELEASE_TABLET | Freq: Every day | ORAL | Status: DC
Start: 2022-03-19 — End: 2022-03-20
  Administered 2022-03-19 – 2022-03-20 (×2): 40 mg via ORAL
  Filled 2022-03-19 (×2): qty 1

## 2022-03-19 MED ORDER — LEVETIRACETAM IN NACL 1500 MG/100ML IV SOLN
1500.0000 mg | Freq: Once | INTRAVENOUS | Status: AC
  Administered 2022-03-19: 1500 mg via INTRAVENOUS
  Filled 2022-03-19: qty 100

## 2022-03-19 MED ORDER — MELATONIN 3 MG PO TABS
3.0000 mg | ORAL_TABLET | Freq: Once | ORAL | Status: AC
  Administered 2022-03-19: 3 mg via ORAL
  Filled 2022-03-19: qty 1

## 2022-03-19 MED ORDER — LEVETIRACETAM IN NACL 500 MG/100ML IV SOLN
500.0000 mg | Freq: Two times a day (BID) | INTRAVENOUS | Status: DC
  Administered 2022-03-19 – 2022-03-20 (×3): 500 mg via INTRAVENOUS
  Filled 2022-03-19 (×3): qty 100

## 2022-03-19 MED ORDER — LORAZEPAM 2 MG/ML IJ SOLN: 1.0000 mg | INTRAMUSCULAR | Status: DC | PRN

## 2022-03-19 NOTE — Assessment & Plan Note (Addendum)
A typical EKG nonspecific troponin is negative for completion can obtain echogram ?Of note patient cannot elaborate further on his nature of his chest pain given intoxication ?

## 2022-03-19 NOTE — Plan of Care (Signed)

## 2022-03-19 NOTE — Progress Notes (Signed)
?  Echocardiogram ?2D Echocardiogram has been performed. ? ?Adam [name] ?03/19/2022, 9:11 AM ?

## 2022-03-19 NOTE — Assessment & Plan Note (Addendum)
Will need to follow up out patient with Neurology  ?Will continue on Keppra 500 mg po bid ?Order seizure precautions ?

## 2022-03-19 NOTE — Subjective & Objective (Addendum)
Had a seizure today. Was trying to quit EtOH ?His seizure lasted few moments with breath episode of postictal state ?He drinks at least half of fifth a day ?He reported some associated CP  ?

## 2022-03-19 NOTE — H&P (Addendum)
? ? ?Adam [name] [employer]. [id number] DOB: 1986/05/23 DOA: 03/18/2022 ? ? ?  ?PCP: Patient, No Pcp Per (Inactive)   ?Outpatient Specialists:  NONE ?  ? ?Patient arrived to ER on 03/18/22 at 2027 ?Referred by Attending Toy Baker, MD ? ? ?Patient coming from:   ? home Lives   With family ?  ? ?Chief Complaint: seizure ?Chief Complaint  ?Patient presents with  ? Alcohol Problem  ? Chest Pain  ? ? ?HPI: ?Adam Culligan Carvey [name] is a 36 y.o. male with medical history significant of alcohol abuse and HTN ?  ? ?Presented with   seizure episodes when he stopped drinking today ?Had a seizure today. Was trying to quit EtOH ?His seizure lasted few moments with breath episode of postictal state ?He drinks at least half of fifth a day ?He reported some associated CP  ? Reports last EtOh was yesterday, he actually now says he has been drinking half a gallon of liquor every day and has been doing so for months he is interested in quitting.  States he does not smoke does not do drugs ?Denies prior history of seizure disorder but states with when he did quit drinking in the past he had significant alcohol withdrawal was patient states that he had to be intubated in the past for alcohol withdrawals.  At this point him patient is too intoxicated and under influence. ?Patient is currently employed.  He has supportive family he is wife is currently pregnant and this is encouraged him to quit drinking ? Initial COVID TEST  ?NEGATIVE  ? ?Lab Results  ?Component Value Date  ? [place] NEGATIVE 03/18/2022  ? [place] NEGATIVE 01/28/2020  ? [place] NEGATIVE 01/21/2020  ? [place] NEGATIVE 11/10/2019  ? ?  ?Regarding pertinent Chronic problems:   ?  ? ? HTN on lisinopril ?  ?While in ER: ?  ? ?Case discussed with neurology who recommended loading patient with Keppra which was done and continue Keppra twice daily. ? ? ?Ordered ? ?CT HEAD   NON acute ?  ? ?Following Medications were ordered in ER: ?Medications   ?LORazepam (ATIVAN) injection 0-4 mg (2 mg Intravenous Given 03/18/22 2114)  ?  Or  ?LORazepam (ATIVAN) tablet 0-4 mg ( Oral See Alternative 03/18/22 2114)  ?LORazepam (ATIVAN) injection 0-4 mg (has no administration in time range)  ?  Or  ?LORazepam (ATIVAN) tablet 0-4 mg (has no administration in time range)  ?thiamine tablet 100 mg ( Oral See Alternative 03/18/22 2144)  ?  Or  ?thiamine (B-1) injection 100 mg (100 mg Intravenous Given 03/18/22 2144)  ?levETIRAcetam (KEPPRA) IVPB 500 mg/100 mL premix (has no administration in time range)  ?levETIRAcetam (KEPPRA) IVPB 1500 mg/ 100 mL premix (0 mg Intravenous Stopped 03/19/22 0114)  ?  ?_______________________________________________________ ?ER Provider Called:   Neurology Dr. Lorrin Goodell ?They Recommend admit to medicine   ?Call back if continues to have seizures ?  ?ED Triage Vitals  ?Enc Vitals Group  ?   BP 03/18/22 2041 127/75  ?   Pulse Rate 03/18/22 2041 90  ?   Resp 03/18/22 2041 16  ?   Temp 03/18/22 2041 98.3 ?F (36.8 ?C)  ?   Temp src --   ?   SpO2 03/18/22 2036 98 %  ?   Weight --   ?   Height --   ?   Head Circumference --   ?   Peak Flow --   ?   Pain Score 03/18/22 2045  7  ?   Pain Loc --   ?   Pain Edu? --   ?   Excl. in [place]? --   ?[id number]    ? _________________________________________ ?Significant initial  Findings: ?Abnormal Labs Reviewed  ?COMPREHENSIVE METABOLIC PANEL - Abnormal; Notable for the following components:  ?    Result Value  ? Creatinine, Ser 1.28 (*)   ? All other components within normal limits  ?ETHANOL - Abnormal; Notable for the following components:  ? Alcohol, Ethyl (B) 236 (*)   ? All other components within normal limits  ?CBC WITH DIFFERENTIAL/PLATELET - Abnormal; Notable for the following components:  ? MCH 34.7 (*)   ? RDW 11.2 (*)   ? All other components within normal limits  ?  ?_________________________ ?Troponin  3 - 3 started at ?ECG: Ordered ?Personally reviewed by me showing: ?HR : 90 ?Rhythm:Sinus rhythm ?ST-t wave  abnormality ?Abnormal ECG ?QTC 429 ?     ?The recent clinical data is shown below. ?Vitals:  ? 03/18/22 2100 03/18/22 2130 03/18/22 2200 03/19/22 0050  ?BP: 119/78 115/70 107/65 (!) 100/58  ?Pulse: 93 99 92 95  ?Resp: 19 17 16 14   ?Temp:      ?SpO2: 98% 95% 94% 95%  ?  ?  ?WBC ? ?   ?Component Value Date/Time  ? WBC 5.7 03/18/2022 2119  ? LYMPHSABS 2.5 03/18/2022 2119  ? MONOABS 0.4 03/18/2022 2119  ? EOSABS 0.0 03/18/2022 2119  ? BASOSABS 0.0 03/18/2022 2119  ?   ? ?Results for orders placed or performed during the hospital encounter of 03/18/22  ?Resp Panel by RT-PCR (Flu A&B, Covid) Nasopharyngeal Swab     Status: None  ? Collection Time: 03/18/22  9:04 PM  ? Specimen: Nasopharyngeal Swab; Nasopharyngeal(NP) swabs in vial transport medium  ?Result Value Ref Range Status  ? SARS Coronavirus 2 by RT PCR NEGATIVE NEGATIVE Final  ?      ? Influenza A by PCR NEGATIVE NEGATIVE Final  ? Influenza B by PCR NEGATIVE NEGATIVE Final  ?      ? ?  ?_______________________________________________ ?Hospitalist was called for admission for   Alcohol withdrawal syndrome, with delirium ([place]) ?   ?Seizure ([place]) ?  ? ? ? ?The following Work up has been ordered so far: ? ?Orders Placed This Encounter  ?Procedures  ? Resp Panel by RT-PCR (Flu A&B, Covid) Nasopharyngeal Swab  ? CT Head Wo Contrast  ? Comprehensive metabolic panel  ? Ethanol  ? Lipase, blood  ? CBC with Differential  ? Magnesium  ? Diet regular Room service appropriate? Yes; Fluid consistency: Thin  ? Vital signs  ? Notify physician (specify)  ? Pharmacy Tech to prioritize PTA Med Rec  ? Initiate [place] Fluid Protocol  ? Clinical institute withdrawal assessment every 6 hours X 48 hours, then every 12 hours x 48 hours  ? Notify MD if CIWA-AR is greater than 10 for 2 consecutive assessments.  ? May administer Ativan PO versus IV if patient is tolerating PO intake well  ? Vital signs every 6 hours X 48 hours, then per unit protocol  ? Cardiac monitoring  ? Full code  ?  Consult to neurology  ? Consult for [place] Admission  ? EKG 12-Lead  ? ED EKG  ? EKG 12-Lead  ? Place in observation (patient's expected length of stay will be less than 2 midnights)  ? Place in psych hold  ?  ? ?OTHER Significant initial  Findings: ? ?  labs showing: ? ?  ?Recent Labs  ?Lab 03/18/22 ?2119  ?NA 136  ?K 4.0  ?CO2 24  ?GLUCOSE 94  ?BUN 17  ?CREATININE 1.28*  ?CALCIUM 9.3  ?MG 2.1  ? ? ?Cr   Up from baseline see below ?Lab Results  ?Component Value Date  ? CREATININE 1.28 (H) 03/18/2022  ? CREATININE 1.03 01/30/2020  ? CREATININE 0.82 01/29/2020  ? ? ?Recent Labs  ?Lab 03/18/22 ?2119  ?AST 34  ?ALT 26  ?ALKPHOS 52  ?BILITOT 0.6  ?PROT 6.8  ?ALBUMIN 4.5  ? ?Lab Results  ?Component Value Date  ? CALCIUM 9.3 03/18/2022  ? PHOS 2.8 01/30/2020  ? ?    ?   ?Plt: ?Lab Results  ?Component Value Date  ? PLT 224 03/18/2022  ? ? ? ?  ?COVID-19 Labs ? ?No results for input(s): DDIMER, FERRITIN, LDH, CRP in the last 72 hours. ? ?Lab Results  ?Component Value Date  ? [place] NEGATIVE 03/18/2022  ? [place] NEGATIVE 01/28/2020  ? [place] NEGATIVE 01/21/2020  ? [place] NEGATIVE 11/10/2019  ? ?  ?   ?Recent Labs  ?Lab 03/18/22 ?2119  ?WBC 5.7  ?NEUTROABS 2.9  ?HGB 14.9  ?HCT 42.0  ?MCV 97.7  ?PLT 224  ? ? ?HG/HCT  stable,    ?   ?Component Value Date/Time  ? HGB 14.9 03/18/2022 2119  ? HGB 16.5 05/14/2012 1916  ? HCT 42.0 03/18/2022 2119  ? HCT 50.1 05/14/2012 1916  ? MCV 97.7 03/18/2022 2119  ? MCV 92 05/14/2012 1916  ?  ?Recent Labs  ?Lab 03/18/22 ?2119  ?LIPASE 41  ?  ? ?    ?Cultures: ?No results found for: SDES, [place], [place], REPTSTATUS ?  ?Radiological Exams on Admission: ?CT Head Wo Contrast ? ?Result Date: 03/18/2022 ?CLINICAL DATA:  Seizure, ETOH detox EXAM: CT HEAD WITHOUT CONTRAST TECHNIQUE: Contiguous axial images were obtained from the base of the skull through the vertex without intravenous contrast. RADIATION DOSE REDUCTION: This exam was performed according to the  departmental dose-optimization program which includes automated exposure control, adjustment of the mA and/or kV according to patient size and/or use of iterative reconstruction technique. COMPARISON:  01/21/2020 FINDI

## 2022-03-19 NOTE — Progress Notes (Signed)
EEG completed, results pending. 

## 2022-03-19 NOTE — Progress Notes (Signed)
?                                  PROGRESS NOTE                                             ?                                                                                                                     ?                                         ? ? Patient Demographics:  ? ? Adam [name], is a 36 y.o. male, DOB - [birth year], [id number] ? ?Outpatient Primary MD for the patient is Patient, No Pcp Per (Inactive)    LOS - 0  Admit date - 03/18/2022   ? ?Chief Complaint  ?Patient presents with  ? Alcohol Problem  ? Chest Pain  ?    ? ?Brief Narrative (HPI from H&P)   - 36 year-old male with history of alcohol abuse and hypertension came to the hospital after drinking about half a gallon of alcohol at home, while he was leaving home he fell to the ground and had a seizure episode.  He was brought to the ER where head CT was unremarkable he was slightly dehydrated had AKI and was admitted to the hospital for further care. ? ? Subjective:  ? ? Adam [name] today has, mild generalized headache, No chest pain, No abdominal pain - No Nausea, No new weakness tingling or numbness, no shortness of breath, feels little tired ? ? Assessment  & Plan :  ? ? ?Ongoing alcohol abuse with alcohol induced seizure in a patient who drank half a gallon of alcohol 15 minutes before his seizure-like activity- head CT unremarkable, currently stable but dehydrated with AKI.  We will get EEG, hydrate with IV fluids, monitor.  He might go into DTs.  Note patient is on Keppra for previous history of seizures which will be continued. ? ?2.  Dehydration with AKI.  Hydrate with IV fluids avoid nephrotoxins ? ?3.  Going alcohol abuse.  Strictly counseled to quit alcohol, monitor for DTs, added Librium to CIWA protocol. ? ?4.  History of hypertension.  Currently stable off of medications as needed IV hydralazine. ? ?5.  History of atypical chest pain upon hospital presentation, EKG nonspecific.  Negative  troponin, currently chest pain-free.  Echocardiogram pending. ? ?  ? ?   ? ?Condition - Fair ? ?Family Communication  :  spouse Verdis Frederickson [phone] on 03/19/22 ? ?Code Status :  Full ? ?Consults  :  None ? ?PUD Prophylaxis : PPI ? ? Procedures  :    ? ?  CT head.  Nonacute.   ? ?EEG ? ?TTE. ? ?   ? ?Disposition Plan  :   ? ?Status is: Observation ? ?DVT Prophylaxis  :   ? ? ?Lab Results  ?Component Value Date  ? PLT 224 03/18/2022  ? ? ?Diet :  ?Diet Order   ? ?       ?  Diet regular Room service appropriate? Yes; Fluid consistency: Thin  Diet effective now       ?  ? ?  ?  ? ?  ?  ? ?Inpatient Medications ? ?Scheduled Meds: ? chlordiazePOXIDE  20 mg Oral TID  ? LORazepam  0-4 mg Intravenous Q6H  ? Or  ? LORazepam  0-4 mg Oral Q6H  ? [START ON 03/21/2022] LORazepam  0-4 mg Intravenous Q12H  ? Or  ? [START ON 03/21/2022] LORazepam  0-4 mg Oral Q12H  ? pantoprazole  40 mg Oral Daily  ? thiamine  100 mg Oral Daily  ? Or  ? thiamine  100 mg Intravenous Daily  ? ?Continuous Infusions: ? dextrose 5 % with KCl 20 mEq / L 20 mEq (03/19/22 0700)  ? levETIRAcetam    ? ?PRN Meds:.LORazepam **OR** LORazepam ? ?Antibiotics  :   ? ?Anti-infectives (From admission, onward)  ? ? None  ? ?  ? ? ? Time Spent in minutes  30 ? ? ?Lala Lund M.D on 03/19/2022 at 8:41 AM ? ?To page go to [email or web address]  ? ?[facility] -  Office  [phone] ? ?See all Orders from today for further details ? ? ? Objective:  ? ?Vitals:  ? 03/19/22 0413 03/19/22 0530 03/19/22 0532 03/19/22 0800  ?BP: 120/74 106/70 106/70   ?Pulse: 81 67 67 64  ?Resp:  15  14  ?Temp:      ?SpO2:  95%  97%  ? ? ?Wt Readings from Last 3 Encounters:  ?12/26/21 72.6 kg  ?01/29/20 73.8 kg  ?01/21/20 75 kg  ? ? ? ?Intake/Output Summary (Last 24 hours) at 03/19/2022 0841 ?Last data filed at 03/19/2022 [id number] ?Gross per 24 hour  ?Intake 2000.76 ml  ?Output --  ?Net 2000.76 ml  ? ? ? ?Physical Exam ? ?Awake Alert, No new F.N deficits,   ?[place].AT,PERRAL ?Supple Neck, No JVD,    ?Symmetrical Chest wall movement, Good air movement bilaterally, CTAB ?RRR,No Gallops,Rubs or new Murmurs,  ?+ve B.Sounds, Abd Soft, No tenderness,   ?No Cyanosis, Clubbing or edema  ?  ?  ? ? Data Review:  ? ? ?CBC ?Recent Labs  ?Lab 03/18/22 ?2119  ?WBC 5.7  ?HGB 14.9  ?HCT 42.0  ?PLT 224  ?MCV 97.7  ?MCH 34.7*  ?MCHC 35.5  ?RDW 11.2*  ?LYMPHSABS 2.5  ?MONOABS 0.4  ?EOSABS 0.0  ?BASOSABS 0.0  ? ? ?Electrolytes ?Recent Labs  ?Lab 03/18/22 ?2119 03/19/22 ?0322 03/19/22 ?[id number]  ?NA 136  --   --   ?K 4.0  --   --   ?CL 98  --   --   ?CO2 24  --   --   ?GLUCOSE 94  --   --   ?BUN 17  --   --   ?CREATININE 1.28*  --   --   ?CALCIUM 9.3  --   --   ?AST 34  --   --   ?ALT 26  --   --   ?ALKPHOS 52  --   --   ?BILITOT 0.6  --   --   ?  ALBUMIN 4.5  --   --   ?MG 2.1  --   --   ?LATICACIDVEN  --  2.0* 1.8  ? ?Radiology Reports ?CT Head Wo Contrast ? ?Result Date: 03/18/2022 ?CLINICAL DATA:  Seizure, ETOH detox EXAM: CT HEAD WITHOUT CONTRAST TECHNIQUE: Contiguous axial images were obtained from the base of the skull through the vertex without intravenous contrast. RADIATION DOSE REDUCTION: This exam was performed according to the departmental dose-optimization program which includes automated exposure control, adjustment of the mA and/or kV according to patient size and/or use of iterative reconstruction technique. COMPARISON:  01/21/2020 FINDINGS: Brain: No evidence of acute infarction, hemorrhage, hydrocephalus, extra-axial collection or mass lesion/mass effect. Mild cortical atrophy. Vascular: No hyperdense vessel or unexpected calcification. Skull: Normal. Negative for fracture or focal lesion. Sinuses/Orbits: The visualized paranasal sinuses are essentially clear. The mastoid air cells are unopacified. Other: None. IMPRESSION: No evidence of acute intracranial abnormality. Mild cortical atrophy. Electronically Signed   By: Julian Hy M.D.   On: 03/18/2022 23:42    ? ? ?

## 2022-03-19 NOTE — Procedures (Signed)
Patient Name: Andrik Sandt [facility].  ?MRN: [id number]  ?Epilepsy Attending: Charlsie Quest  ?Referring Physician/Provider: Leroy Sea, MD ?Date: 03/19/2022 ?Duration: 23.59 mins ? ?Patient history: 36 y.o. male with medical history significant of alcohol abuse and HTN presented with seizure  like episodes when he stopped drinking. EEG to evaluate for seizure.  ? ?Level of alertness: Awake, asleep ? ?AEDs during EEG study: Ativan, LEV ? ?Technical aspects: This EEG study was done with scalp electrodes positioned according to the 10-20 International system of electrode placement. Electrical activity was acquired at a sampling rate of 500Hz  and reviewed with a high frequency filter of 70Hz  and a low frequency filter of 1Hz . EEG data were recorded continuously and digitally stored.  ? ?Description: The posterior dominant rhythm consists of 9 Hz activity of moderate voltage (25-35 uV) seen predominantly in posterior head regions, symmetric and reactive to eye opening and eye closing. Sleep was characterized by vertex waves, sleep spindles (12 to 14 Hz), maximal frontocentral region. Physiologic photic driving was not seen during photic stimulation.  Hyperventilation was not performed.    ? ?IMPRESSION: ?This study is within normal limits. No seizures or epileptiform discharges were seen throughout the recording. ? ?  ? ?

## 2022-03-19 NOTE — Plan of Care (Signed)
  Problem: Education: Goal: Knowledge of General Education information will improve Description: Including pain rating scale, medication(s)/side effects and non-pharmacologic comfort measures Outcome: Progressing   Problem: Health Behavior/Discharge Planning: Goal: Ability to manage health-related needs will improve Outcome: Progressing   Problem: Activity: Goal: Risk for activity intolerance will decrease Outcome: Progressing   Problem: Nutrition: Goal: Adequate nutrition will be maintained Outcome: Progressing   Problem: Coping: Goal: Level of anxiety will decrease Outcome: Progressing   Problem: Elimination: Goal: Will not experience complications related to bowel motility Outcome: Progressing   Problem: Pain Managment: Goal: General experience of comfort will improve Outcome: Progressing   Problem: Safety: Goal: Ability to remain free from injury will improve Outcome: Progressing   

## 2022-03-19 NOTE — ED Notes (Signed)
This paramedic reached out to MD to clarify whether or not patient was psych. MD states Patient here for alcohol detox denying HI and SI. Pt not psych at this time. Patient dressed in normal clothes and patients belongings at bedside. Patient on cardiac monitor for continuous evaluation until medically cleared.  ?

## 2022-03-19 NOTE — Assessment & Plan Note (Addendum)
Order CIWA protocol may develop severe withdrawals in which case may need Precedex and ICU transfer monitor in stepdown/progressive for now ?Patient is still under influence ?

## 2022-03-19 NOTE — Assessment & Plan Note (Signed)
Allow permissive hypertension for now patient somewhat hypotensive ?

## 2022-03-19 NOTE — Assessment & Plan Note (Signed)
ciwa

## 2022-03-20 LAB — COMPREHENSIVE METABOLIC PANEL
ALT: 23 U/L (ref 0–44)
AST: 29 U/L (ref 15–41)
Albumin: 3.4 g/dL — ABNORMAL LOW (ref 3.5–5.0)
Alkaline Phosphatase: 40 U/L (ref 38–126)
Anion gap: 5 (ref 5–15)
BUN: 14 mg/dL (ref 6–20)
CO2: 24 mmol/L (ref 22–32)
Calcium: 9 mg/dL (ref 8.9–10.3)
Chloride: 107 mmol/L (ref 98–111)
Creatinine, Ser: 0.97 mg/dL (ref 0.61–1.24)
GFR, Estimated: >60 mL/min (ref >60)
Glucose, Bld: 89 mg/dL (ref 70–99)
Potassium: 4.3 mmol/L (ref 3.5–5.1)
Sodium: 136 mmol/L (ref 135–145)
Total Bilirubin: 1.3 mg/dL — ABNORMAL HIGH (ref 0.3–1.2)
Total Protein: 5.4 g/dL — ABNORMAL LOW (ref 6.5–8.1)

## 2022-03-20 LAB — CBC
HCT: 39.7 % (ref 39.0–52.0)
Hemoglobin: 13.4 g/dL (ref 13.0–17.0)
MCH: 33.1 pg (ref 26.0–34.0)
MCHC: 33.8 g/dL (ref 30.0–36.0)
MCV: 98 fL (ref 80.0–100.0)
Platelets: 160 10*3/uL (ref 150–400)
RBC: 4.05 MIL/uL — ABNORMAL LOW (ref 4.22–5.81)
RDW: 11.1 % — ABNORMAL LOW (ref 11.5–15.5)
WBC: 4.1 10*3/uL (ref 4.0–10.5)
nRBC: 0 % (ref 0.0–0.2)

## 2022-03-20 LAB — MAGNESIUM: Magnesium: 1.7 mg/dL (ref 1.7–2.4)

## 2022-03-20 MED ORDER — CHLORDIAZEPOXIDE HCL 5 MG PO CAPS: ORAL_CAPSULE | ORAL | 0 refills | Status: DC

## 2022-03-20 MED ORDER — LEVETIRACETAM 500 MG PO TABS: 500.0000 mg | ORAL_TABLET | Freq: Two times a day (BID) | ORAL | 0 refills | Status: DC

## 2022-03-20 MED ORDER — FOLIC ACID 1 MG PO TABS: 1.0000 mg | ORAL_TABLET | Freq: Every day | ORAL | 0 refills | Status: DC

## 2022-03-20 MED ORDER — CARVEDILOL 3.125 MG PO TABS: 3.1250 mg | ORAL_TABLET | Freq: Two times a day (BID) | ORAL | 11 refills | Status: DC

## 2022-03-20 MED ORDER — THIAMINE HCL 100 MG PO TABS: 100.0000 mg | ORAL_TABLET | Freq: Every day | ORAL | 0 refills | Status: DC

## 2022-03-20 MED ORDER — TRAZODONE HCL 100 MG PO TABS
100.0000 mg | ORAL_TABLET | Freq: Every day | ORAL | 0 refills | Status: DC
Start: 2022-03-20 — End: 2022-05-25

## 2022-03-20 NOTE — Plan of Care (Signed)
?  ?                                    [facility] ?                           [address] [place], [place] [address] ?  ? ? ? ?Adam [name] was admitted to the Hospital on 03/18/2022 and Discharged  03/20/2022 and should be excused from work/school  ? ?for  4  days starting from date -  03/18/2022 , may return to work/school without any restrictions. ? ?Call Susa Raring MD, [facility]  [phone] with questions. ? ?Susa Raring M.D on 03/20/2022,at 11:16 AM ? ?[facility]   ?Office  [phone] ? ?

## 2022-03-20 NOTE — Discharge Summary (Signed)
?                                                                                ? ?Adam [name]. PYY:[id number] DOB: 05-28-1986 DOA: 03/18/2022 ? ?PCP: Patient, No Pcp Per (Inactive) ? ?Admit date: 03/18/2022  Discharge date: 03/20/2022 ? ?Admitted From: Home   Disposition:  Home ? ? ?Recommendations for Outpatient Follow-up:  ? ?Follow up with PCP in 1-2 weeks ? ?PCP Please obtain BMP/CBC, 2 view CXR in 1week,  (see Discharge instructions)  ? ?PCP Please follow up on the following pending results: must follow with Neurology in 1-2 days ? ? ?Home Health: None   ?Equipment/Devices: None  ?Consultations: None  ?Discharge Condition: Stable    ?CODE STATUS: Full    ?Diet Recommendation: Heart Healthy  ?  ? ?Chief Complaint  ?Patient presents with  ? Alcohol Problem  ? Chest Pain  ?  ? ?Brief history of present illness from the day of admission and additional interim summary   ? ?36 year-old male with history of alcohol abuse and hypertension came to the hospital after drinking about half a gallon of alcohol at home, while he was leaving home he fell to the ground and had a seizure episode.  He was brought to the ER where head CT was unremarkable he was slightly dehydrated had AKI and was admitted to the hospital for further care. ? ?                                                               Hospital Course  ? ? Ongoing alcohol abuse with alcohol induced seizure in a patient who drank half a gallon of alcohol 15 minutes before his seizure-like activity- head CT unremarkable, unremarkable EEG and echocardiogram, seizures could have been due to alcohol intoxication, he was started on Keppra in the ER which will be continued, currently seizure-free, seizure instructions given in writing with outpatient neurology and PCP follow-up.  Will give him 1 month supply of Keppra till he is seen by neurology and PCP.   ? ?2.   Dehydration with AKI.  Resolved after IV fluids. ?  ?3.  Going alcohol abuse.  Strictly counseled to quit alcohol, monitor for DTs, [place] no signs of DTs will be discharged on Librium taper, seems motivated to quit alcohol.  Outpatient referral per social work. ?  ?4.  History of hypertension.  Patient has been placed on Coreg. ?  ?5.  History of atypical chest pain upon hospital presentation, EKG nonspecific.  Negative troponin, currently chest pain-free.  Stable echocardiogram. ? ? ?Discharge diagnosis   ? ? ?Principal Problem: ?  Alcohol withdrawal delirium (HCC) ?Active Problems: ?  Essential hypertension ?  Alcohol withdrawal seizure without complication (HCC) ?  Chest pain ? ? ? ?Discharge instructions   ? ?Discharge Instructions   ? ? Diet - low sodium heart healthy   Complete by: As directed ?  ? Discharge instructions  Complete by: As directed ?  ? Do not drive, operate heavy machinery, perform activities at heights, swimming or participation in water activities or provide baby sitting [name] until you have seen by Primary MD or a Neurologist and advised to do so again. ? ?Follow with Primary MD  in 7 days  ? ?Get CBC, CMP, 2 view Chest X ray -  checked next visit within 1 week by Primary MD   ? ?Activity: As tolerated with Full fall precautions use walker/cane & assistance as needed ? ?Disposition Home   ? ?Diet: Heart Healthy   ? ?Special Instructions: If you have smoked or chewed Tobacco  in the last 2 yrs please stop smoking, stop any regular Alcohol  and or any Recreational drug use. ? ?On your next visit with your primary care physician please Get Medicines reviewed and adjusted. ? ?Please request your Prim.MD to go over all Hospital Tests and Procedure/Radiological results at the follow up, please get all Hospital records sent to your Prim MD by signing hospital release before you go home. ? ?If you experience worsening of your admission symptoms, develop shortness of breath, life threatening  emergency, suicidal or homicidal thoughts you must seek medical attention immediately by calling 911 or calling your MD immediately  if symptoms less severe. ? ?You Must read complete instructions/literature along with all the possible adverse reactions/side effects for all the Medicines you take and that have been prescribed to you. Take any new Medicines after you have completely understood and accpet all the possible adverse reactions/side effects.  ? Increase activity slowly   Complete by: As directed ?  ? ?  ? ? ?Discharge Medications  ? ?Allergies as of 03/20/2022   ?No Known Allergies ?  ? ?  ?Medication List  ?  ? ?STOP taking these medications   ? ?HYDROcodone-acetaminophen 5-325 MG tablet ?Commonly known as: NORCO/VICODIN ?  ?lisinopril 20 MG tablet ?Commonly known as: ZESTRIL ?  ? ?  ? ?TAKE these medications   ? ?carvedilol 3.125 MG tablet ?Commonly known as: Coreg ?Take 1 tablet (3.125 mg total) by mouth 2 (two) times daily. ?  ?chlordiazePOXIDE 5 MG capsule ?Commonly known as: LIBRIUM ?Please dispense 18 pills - Take 1 pill three times a day for 3 days, then Take 1 pill two times a day for 3 days, then Take 1 pill once a day for 3 days and stop. ?  ?folic acid 1 MG tablet ?Commonly known as: FOLVITE ?Take 1 tablet (1 mg total) by mouth daily. ?  ?levETIRAcetam 500 MG tablet ?Commonly known as: Keppra ?Take 1 tablet (500 mg total) by mouth 2 (two) times daily. ?  ?thiamine 100 MG tablet ?Take 1 tablet (100 mg total) by mouth daily. ?Start taking on: March 21, 2022 ?  ?traZODone 100 MG tablet ?Commonly known as: DESYREL ?Take 1 tablet (100 mg total) by mouth at bedtime. ?  ? ?  ? ? ? Follow-up Information   ? ? [facility]. Schedule an appointment as soon as possible for a visit in 1 week(s).   ?Contact information: ?[place] ?[place] [phone] ?[phone] ? ?  ?  ? ? [facility]. Schedule an appointment as soon as  possible for a visit in 1 week(s).   ?Contact information: ?[place] [phone] ?[phone] ? ?  ?  ? ?  ?  ? ?  ? ? ?  Major procedures and Radiology Reports - PLEASE review detailed and final reports thoroughly  -    ? ?  ?CT Head Wo Contrast ? ?Result Date: 03/18/2022 ?CLINICAL DATA:  Seizure, ETOH detox EXAM: CT HEAD WITHOUT CONTRAST TECHNIQUE: Contiguous axial images were obtained from the base of the skull through the vertex without intravenous contrast. RADIATION DOSE REDUCTION: This exam was performed according to the departmental dose-optimization program which includes automated exposure control, adjustment of the mA and/or kV according to patient size and/or use of iterative reconstruction technique. COMPARISON:  01/21/2020 FINDINGS: Brain: No evidence of acute infarction, hemorrhage, hydrocephalus, extra-axial collection or mass lesion/mass effect. Mild cortical atrophy. Vascular: No hyperdense vessel or unexpected calcification. Skull: Normal. Negative for fracture or focal lesion. Sinuses/Orbits: The visualized paranasal sinuses are essentially clear. The mastoid air cells are unopacified. Other: None. IMPRESSION: No evidence of acute intracranial abnormality. Mild cortical atrophy. Electronically Signed   By: Julian Hy M.D.   On: 03/18/2022 23:42  ? ?EEG adult ? ?Result Date: 03/19/2022 ?Lora Havens, MD     03/19/2022 11:23 AM Patient Name: Adam Gloss Studley Jr. MRN: [id number] Epilepsy Attending: Lora Havens Referring Physician/Provider: Thurnell Lose, MD Date: 03/19/2022 Duration: 23.59 mins Patient history: 36 y.o. male with medical history significant of alcohol abuse and HTN presented with seizure  like episodes when he stopped drinking. EEG to evaluate for seizure. Level of alertness: Awake, asleep AEDs during EEG study: Ativan, LEV Technical aspects: This EEG study was done with scalp electrodes positioned according to the  10-20 International system of electrode placement. Electrical activity was acquired at a sampling rate of 500Hz  and reviewed with a high frequency filter of 70Hz  and a low frequency filter of 1Hz . EEG data were

## 2022-03-20 NOTE — TOC CAGE-AID Note (Signed)
Transition of Care (TOC) - CAGE-AID Screening ? ? ?Patient Details  ?Name: Adam [name] [facility]. ?MRN: [id number] ?Date of Birth: [birth year] ? ?Transition of Care (TOC) CM/SW Contact:    ?Bess Kinds, RN ?Phone Number: [phone] ?03/20/2022, 11:09 AM ? ? ?Clinical Narrative: ? ?Spoke with patient on his mobile phone to discuss post acute transition. Discussed need for PCP - he stated that he will schedule an appointment with his dad's PCP for hospital follow up and to establish care. Discussed potential delay for first appointment and to schedule appointment any way - patient verbalized understanding.  ? ?Discussed desire to quit drinking alcohol. Patient is motivated to quit with pending birth of new baby. Discussed potential resources and placed information on AVS.  ? ?Discussed importance of getting medications filled. Patient states familiarity with [facility]. Discussed that [facility] has good affordability options.  ? ?Patient able to arrange transportation home.  ? ?No further TOC needs identified at this time.  ? ?CAGE-AID Screening: ?  ? ?Have You Ever Felt You Ought to Cut Down on Your Drinking or Drug Use?: Yes ?Have People Annoyed You By Critizing Your Drinking Or Drug Use?: Yes ?Have You Felt Bad Or Guilty About Your Drinking Or Drug Use?: Yes ?Have You Ever Had a Drink or Used Drugs First Thing In The Morning to Steady Your Nerves or to Get Rid of a Hangover?: No ?CAGE-AID Score: 3 ? ?Substance Abuse Education Offered: Yes ? ?Substance abuse interventions: Transport planner, SDOH Screening, Patient Counseling ? ? ? ? ? ? ?

## 2022-03-20 NOTE — TOC Transition Note (Signed)
Transition of Care (TOC) - CM/SW Discharge Note ? ? ?Patient Details  ?Name: Philander Ake [facility]. ?MRN: [id number] ?Date of Birth: Aug 25, 1986 ? ?Transition of Care (TOC) CM/SW Contact:  ?Mearl Latin, LCSW ?Phone Number: ?03/20/2022, 11:03 AM ? ? ?Clinical Narrative:    ?CSW received consult regarding patient requiring substance use resources. CSW spoke with patient at bedside. His wife was also present and patient provided verbal permission for her to remain at the bedside during conversation. CSW spoke with him regarding ETOH use. Patient states that he is ready to quit drinking due to having a baby on the way. He accepted community resources from CSW. [facility] info placed on AVS for patient to call for an appointment on Monday. ? ? ? ? ? ? ? ?Final next level of care: Home/Self Care ?Barriers to Discharge: No Barriers Identified ? ? ?Patient Goals and CMS Choice ?  ?  ?  ? ?Discharge Placement ?  ?           ?  ?  ?  ?  ? ?Discharge Plan and Services ?In-house Referral: Clinical Social Work ?  ?           ?  ?  ?  ?  ?  ?  ?  ?  ?  ?  ? ?Social Determinants of Health (SDOH) Interventions ?  ? ? ?Readmission Risk Interventions ?   ? View : No data to display.  ?  ?  ?  ? ? ? ? ? ?

## 2022-03-20 NOTE — Discharge Instructions (Addendum)
Do not drive, operate heavy machinery, perform activities at heights, swimming or participation in water activities or provide baby sitting services until you have seen by Primary MD or a Neurologist and advised to do so again. ? ?Follow with Primary MD  in 7 days  ? ?Get CBC, CMP, 2 view Chest X ray -  checked next visit within 1 week by Primary MD   ? ?Activity: As tolerated with Full fall precautions use walker/cane & assistance as needed ? ?Disposition Home   ? ?Diet: Heart Healthy   ? ?Special Instructions: If you have smoked or chewed Tobacco  in the last 2 yrs please stop smoking, stop any regular Alcohol  and or any Recreational drug use. ? ?On your next visit with your primary care physician please Get Medicines reviewed and adjusted. ? ?Please request your Prim.MD to go over all Hospital Tests and Procedure/Radiological results at the follow up, please get all Hospital records sent to your Prim MD by signing hospital release before you go home. ? ?If you experience worsening of your admission symptoms, develop shortness of breath, life threatening emergency, suicidal or homicidal thoughts you must seek medical attention immediately by calling 911 or calling your MD immediately  if symptoms less severe. ? ?You Must read complete instructions/literature along with all the possible adverse reactions/side effects for all the Medicines you take and that have been prescribed to you. Take any new Medicines after you have completely understood and accpet all the possible adverse reactions/side effects.  ? ?[facility] ?Addiction treatment center ?[address] ?Closed ? Opens 6:30?AM Sun ? [phone] ?Medicare/Medicaid accepted ? ?[facility] ?Addiction treatment center ?[address] ?Closed ? Opens 9?AM Mon ? [phone] ?Medicare/Medicaid accepted ? ?[facility] ?Addiction treatment center ?[address] ?Open 24 hours ? [phone] ? ?[facility] ?[address] ?Closed ? Opens 9?AM Mon ? [phone] ? ?[facility] ?Alcoholism treatment program ?[address] ?Closed ? Opens 8?AM Mon ? [phone] ? ?[facility] ?Addiction treatment center ?[place], [place] ?([place] ?[facility] ? ?Addiction treatment center ?[address] ?Open ? Closes 1?PM ? [phone] ?Medicare/Medicaid accepted ?  ?alcoholics anonymous in [place] ?[address] ? [phone] ?

## 2022-04-23 ENCOUNTER — Encounter: Payer: Self-pay | Admitting: Emergency Medicine

## 2022-04-23 ENCOUNTER — Inpatient Hospital Stay
Admission: EM | Admit: 2022-04-23 | Discharge: 2022-04-25 | DRG: 894 | Discharge status: Against Medical Advice | Payer: Self-pay | Attending: General Practice | Admitting: General Practice

## 2022-04-23 ENCOUNTER — Other Ambulatory Visit: Payer: Self-pay

## 2022-04-23 DIAGNOSIS — F1010 Alcohol abuse, uncomplicated: Secondary | ICD-10-CM

## 2022-04-23 DIAGNOSIS — Y908 Blood alcohol level of 240 mg/100 ml or more: Secondary | ICD-10-CM | POA: Diagnosis present

## 2022-04-23 DIAGNOSIS — Z79899 Other long term (current) drug therapy: Secondary | ICD-10-CM

## 2022-04-23 DIAGNOSIS — F1029 Alcohol dependence with unspecified alcohol-induced disorder: Principal | ICD-10-CM | POA: Diagnosis present

## 2022-04-23 DIAGNOSIS — Y92009 Unspecified place in unspecified non-institutional (private) residence as the place of occurrence of the external cause: Secondary | ICD-10-CM

## 2022-04-23 DIAGNOSIS — W19XXXA Unspecified fall, initial encounter: Secondary | ICD-10-CM | POA: Diagnosis present

## 2022-04-23 DIAGNOSIS — S0990XA Unspecified injury of head, initial encounter: Secondary | ICD-10-CM

## 2022-04-23 DIAGNOSIS — G319 Degenerative disease of nervous system, unspecified: Secondary | ICD-10-CM | POA: Diagnosis present

## 2022-04-23 DIAGNOSIS — K7010 Alcoholic hepatitis without ascites: Secondary | ICD-10-CM | POA: Diagnosis present

## 2022-04-23 DIAGNOSIS — I10 Essential (primary) hypertension: Secondary | ICD-10-CM | POA: Diagnosis present

## 2022-04-23 DIAGNOSIS — Z8249 Family history of ischemic heart disease and other diseases of the circulatory system: Secondary | ICD-10-CM

## 2022-04-23 DIAGNOSIS — R569 Unspecified convulsions: Secondary | ICD-10-CM

## 2022-04-23 DIAGNOSIS — F10951 Alcohol use, unspecified with alcohol-induced psychotic disorder with hallucinations: Secondary | ICD-10-CM

## 2022-04-23 DIAGNOSIS — R441 Visual hallucinations: Secondary | ICD-10-CM | POA: Diagnosis present

## 2022-04-23 DIAGNOSIS — F10229 Alcohol dependence with intoxication, unspecified: Secondary | ICD-10-CM | POA: Diagnosis present

## 2022-04-23 DIAGNOSIS — R7401 Elevation of levels of liver transaminase levels: Secondary | ICD-10-CM

## 2022-04-23 DIAGNOSIS — F10939 Alcohol use, unspecified with withdrawal, unspecified: Principal | ICD-10-CM

## 2022-04-23 MED ORDER — THIAMINE HCL 100 MG/ML IJ SOLN
100.0000 mg | Freq: Once | INTRAMUSCULAR | Status: AC
  Administered 2022-04-23: 100 mg via INTRAVENOUS
  Filled 2022-04-23: qty 2

## 2022-04-23 MED ORDER — LORAZEPAM 2 MG/ML IJ SOLN
2.0000 mg | Freq: Once | INTRAMUSCULAR | Status: AC
  Administered 2022-04-23: 2 mg via INTRAVENOUS
  Filled 2022-04-23: qty 1

## 2022-04-23 NOTE — ED Triage Notes (Signed)
Pt coming from home.  Earlier today has a near syncopal episode and hit his head today and wanted to get checked out.   Pt was found outside of ED parking lot with seizure like activity. Pt states he has a hx seizures but has not had a seizure in a while. Pt states he does drink a 5th of alcohol per day and states his last drink was yesterday.

## 2022-04-23 NOTE — ED Provider Notes (Signed)
[facility] Provider Note    Event Date/Time   First MD Initiated Contact with Patient 04/23/22 2344     (approximate)   History   Seizures   HPI  Adam [name]. is a 36 y.o. male who reports a history of daily alcohol abuse (1/5 of liquor every day) and a history of prior seizure when he stops drinking.  He presents tonight after a fall at home and striking his head.  He came to the emergency department by private vehicle and started to check himself in.  He then got a phone call went outside where he was seen having a seizure-like episode on the ground.  He was brought in to a room immediately and I saw him within minutes.  At this point he is no longer exhibiting seizure-like activity and seems to be back to his baseline.  He reports that he drinks 1/5 of liquor daily but he decided to stop drinking cold [place] yesterday.  He says he has had no alcohol since some point yesterday afternoon.  He confirmed that it has been years since he had a seizure but the last time he had 1, it was because he had stopped drinking or cut way back on his drinking.  He denies any other chronic medical issues.  He reports some pain in his head.  He has some nausea and has vomited at least once.  He denies chest pain or shortness of breath.  Denies other drug use.     Physical Exam   ***   Most recent vital signs: There were no vitals filed for this visit.   General: Somewhat somnolent but generally alert and oriented.  Contusion to the right side of his forehead. CV:  Good peripheral perfusion.  Normal heart sounds. Resp:  Normal effort.  Lungs clear to auscultation. Abd:  No distention.  No tenderness to palpation.  Evidence of prior surgical scars on abdomen, well-healed. Other:  Mild tremulousness.  Moving all 4 extremities, no focal neurological deficits.   ED Results / Procedures / Treatments   Labs (all labs ordered are listed, but only abnormal  results are displayed) Labs Reviewed  ETHANOL  COMPREHENSIVE METABOLIC PANEL  PROTIME-INR  CBC WITH DIFFERENTIAL/PLATELET  URINE DRUG SCREEN, QUALITATIVE ([place])     EKG  ED ECG REPORT I, Hinda Kehr, the attending physician, personally viewed and interpreted this ECG.  Date: 04/23/2022 EKG Time: 23: 42 Rate: 95 Rhythm: Sinus rhythm with short PR interval QRS Axis: normal Intervals: PR interval 64 ms ST/T Wave abnormalities: Non-specific ST segment / T-wave changes, but no clear evidence of acute ischemia. Narrative Interpretation: no definitive evidence of acute ischemia; does not meet STEMI criteria.  Artifact is present due to the patient's tremulousness.    RADIOLOGY ***    PROCEDURES:  Critical Care performed: Yes, see critical care procedure note(s)  .1-3 Lead EKG Interpretation Performed by: Hinda Kehr, MD Authorized by: Hinda Kehr, MD     Interpretation: normal     ECG rate:  95   ECG rate assessment: normal     Rhythm: sinus rhythm     Ectopy: none     Conduction: normal   .Critical Care Performed by: Hinda Kehr, MD Authorized by: Hinda Kehr, MD   Critical care provider statement:    Critical care time (minutes):  30   Critical care time was exclusive of:  Separately billable procedures and treating other patients   Critical care was  necessary to treat or prevent imminent or life-threatening deterioration of the following conditions:  Toxidrome and metabolic crisis   Critical care was time spent personally by me on the following activities:  Development of treatment plan with patient or surrogate, evaluation of patient's response to treatment, examination of patient, obtaining history from patient or surrogate, ordering and performing treatments and interventions, ordering and review of laboratory studies, ordering and review of radiographic studies, pulse oximetry, re-evaluation of patient's condition and review of old  charts   MEDICATIONS ORDERED IN ED: Medications  LORazepam (ATIVAN) injection 2 mg (has no administration in time range)  thiamine (B-1) injection 100 mg (has no administration in time range)     IMPRESSION / MDM / [place] / ED COURSE  I reviewed the triage vital signs and the nursing notes.                              Differential diagnosis includes, but is not limited to, alcohol withdrawal seizure, acute head injury with intracranial bleed, other nonspecific electrolyte or metabolic abnormality, acute infectious process such as encephalitis/meningitis.  Patient's presentation is most consistent with acute presentation with potential threat to life or bodily function.  Patient is essentially back to baseline, just a little bit somnolent after his seizure-like episode.  Given the history he provides, he is almost certainly suffering from an alcohol withdrawal seizure.  I ordered Ativan 2 mg IV.  The patient is on the cardiac monitor to evaluate for evidence of arrhythmia and/or significant heart rate changes, and will be watched on continuous pulse oximeter.  Seizure precautions have been ordered.  CIWA protocol was also ordered, along with order for thiamine 100 mg IV.  He has an obvious contusion to the right side of his forehead but I do not feel he is stable to go to the CT scanner at this time, at least until he is stabilized and has received his Ativan.  At that point I will scan his head and neck to rule out intracranial and cervical spine injury.  I ordered the following lab work: Comprehensive metabolic panel, CBC with differential, pro time-INR, ethanol level, urine drug screen.  EKG interpreted by me, no evidence of ischemia or acute arrhythmias nor interval abnormalities.       FINAL CLINICAL IMPRESSION(S) / ED DIAGNOSES   Final diagnoses:  None     Rx / DC Orders   ED Discharge Orders     None        Note:  This document was prepared using  Dragon voice recognition software and may include unintentional dictation errors.

## 2022-04-24 ENCOUNTER — Inpatient Hospital Stay: Payer: Self-pay

## 2022-04-24 ENCOUNTER — Emergency Department: Payer: Self-pay

## 2022-04-24 DIAGNOSIS — R7401 Elevation of levels of liver transaminase levels: Secondary | ICD-10-CM

## 2022-04-24 DIAGNOSIS — R569 Unspecified convulsions: Secondary | ICD-10-CM

## 2022-04-24 DIAGNOSIS — F10951 Alcohol use, unspecified with alcohol-induced psychotic disorder with hallucinations: Secondary | ICD-10-CM

## 2022-04-24 DIAGNOSIS — I10 Essential (primary) hypertension: Secondary | ICD-10-CM

## 2022-04-24 DIAGNOSIS — F10931 Alcohol use, unspecified with withdrawal delirium: Secondary | ICD-10-CM

## 2022-04-24 DIAGNOSIS — F1010 Alcohol abuse, uncomplicated: Secondary | ICD-10-CM

## 2022-04-24 LAB — COMPREHENSIVE METABOLIC PANEL
ALT: 105 U/L — ABNORMAL HIGH (ref 0–44)
ALT: 111 U/L — ABNORMAL HIGH (ref 0–44)
AST: 132 U/L — ABNORMAL HIGH (ref 15–41)
AST: 147 U/L — ABNORMAL HIGH (ref 15–41)
Albumin: 4.2 g/dL (ref 3.5–5.0)
Albumin: 4.6 g/dL (ref 3.5–5.0)
Alkaline Phosphatase: 59 U/L (ref 38–126)
Alkaline Phosphatase: 68 U/L (ref 38–126)
Anion gap: 10 (ref 5–15)
Anion gap: 12 (ref 5–15)
BUN: 15 mg/dL (ref 6–20)
BUN: 18 mg/dL (ref 6–20)
CO2: 24 mmol/L (ref 22–32)
CO2: 28 mmol/L (ref 22–32)
Calcium: 9.5 mg/dL (ref 8.9–10.3)
Calcium: 9.7 mg/dL (ref 8.9–10.3)
Chloride: 101 mmol/L (ref 98–111)
Chloride: 102 mmol/L (ref 98–111)
Creatinine, Ser: 1.15 mg/dL (ref 0.61–1.24)
Creatinine, Ser: 1.45 mg/dL — ABNORMAL HIGH (ref 0.61–1.24)
GFR, Estimated: >60 mL/min (ref >60)
GFR, Estimated: >60 mL/min (ref >60)
Glucose, Bld: 107 mg/dL — ABNORMAL HIGH (ref 70–99)
Glucose, Bld: 87 mg/dL (ref 70–99)
Potassium: 3.6 mmol/L (ref 3.5–5.1)
Potassium: 3.7 mmol/L (ref 3.5–5.1)
Sodium: 138 mmol/L (ref 135–145)
Sodium: 139 mmol/L (ref 135–145)
Total Bilirubin: 0.8 mg/dL (ref 0.3–1.2)
Total Bilirubin: 1 mg/dL (ref 0.3–1.2)
Total Protein: 6.9 g/dL (ref 6.5–8.1)
Total Protein: 7.4 g/dL (ref 6.5–8.1)

## 2022-04-24 LAB — CBC WITH DIFFERENTIAL/PLATELET
Abs Immature Granulocytes: 0.01 10*3/uL (ref 0.00–0.07)
Basophils Absolute: 0.1 10*3/uL (ref 0.0–0.1)
Basophils Relative: 1 %
Eosinophils Absolute: 0 10*3/uL (ref 0.0–0.5)
Eosinophils Relative: 0 %
HCT: 44.4 % (ref 39.0–52.0)
Hemoglobin: 15.4 g/dL (ref 13.0–17.0)
Immature Granulocytes: 0 %
Lymphocytes Relative: 44 %
Lymphs Abs: 2.8 10*3/uL (ref 0.7–4.0)
MCH: 34 pg (ref 26.0–34.0)
MCHC: 34.7 g/dL (ref 30.0–36.0)
MCV: 98 fL (ref 80.0–100.0)
Monocytes Absolute: 0.6 10*3/uL (ref 0.1–1.0)
Monocytes Relative: 9 %
Neutro Abs: 3 10*3/uL (ref 1.7–7.7)
Neutrophils Relative %: 46 %
Platelets: 202 10*3/uL (ref 150–400)
RBC: 4.53 MIL/uL (ref 4.22–5.81)
RDW: 11.9 % (ref 11.5–15.5)
WBC: 6.4 10*3/uL (ref 4.0–10.5)
nRBC: 0 % (ref 0.0–0.2)

## 2022-04-24 LAB — CBC
HCT: 43.9 % (ref 39.0–52.0)
Hemoglobin: 14.9 g/dL (ref 13.0–17.0)
MCH: 33.3 pg (ref 26.0–34.0)
MCHC: 33.9 g/dL (ref 30.0–36.0)
MCV: 98 fL (ref 80.0–100.0)
Platelets: 191 10*3/uL (ref 150–400)
RBC: 4.48 MIL/uL (ref 4.22–5.81)
RDW: 12 % (ref 11.5–15.5)
WBC: 5.2 10*3/uL (ref 4.0–10.5)
nRBC: 0 % (ref 0.0–0.2)

## 2022-04-24 LAB — URINE DRUG SCREEN, QUALITATIVE (ARMC ONLY)
Amphetamines, Ur Screen: NOT DETECTED
Barbiturates, Ur Screen: NOT DETECTED
Benzodiazepine, Ur Scrn: NOT DETECTED
Cannabinoid 50 Ng, Ur ~~LOC~~: NOT DETECTED
Cocaine Metabolite,Ur ~~LOC~~: NOT DETECTED
MDMA (Ecstasy)Ur Screen: NOT DETECTED
Methadone Scn, Ur: NOT DETECTED
Opiate, Ur Screen: NOT DETECTED
Phencyclidine (PCP) Ur S: NOT DETECTED
Tricyclic, Ur Screen: NOT DETECTED

## 2022-04-24 LAB — PROTIME-INR
INR: 0.9 (ref 0.8–1.2)
Prothrombin Time: 12.1 seconds (ref 11.4–15.2)

## 2022-04-24 LAB — ETHANOL: Alcohol, Ethyl (B): 258 mg/dL — ABNORMAL HIGH (ref <10)

## 2022-04-24 LAB — CBG MONITORING, ED: Glucose-Capillary: 134 mg/dL — ABNORMAL HIGH (ref 70–99)

## 2022-04-24 LAB — HIV ANTIBODY (ROUTINE TESTING W REFLEX): HIV Screen 4th Generation wRfx: NONREACTIVE

## 2022-04-24 LAB — LACTATE DEHYDROGENASE: LDH: 141 U/L (ref 98–192)

## 2022-04-24 MED ORDER — LORAZEPAM 1 MG PO TABS: 1.0000 mg | ORAL_TABLET | ORAL | Status: DC | PRN

## 2022-04-24 MED ORDER — ADULT MULTIVITAMIN W/MINERALS CH: 1.0000 | ORAL_TABLET | Freq: Every day | ORAL | Status: DC

## 2022-04-24 MED ORDER — THIAMINE HCL 100 MG PO TABS: 100.0000 mg | ORAL_TABLET | Freq: Every day | ORAL | Status: DC

## 2022-04-24 MED ORDER — LORAZEPAM 2 MG PO TABS: 0.0000 mg | ORAL_TABLET | Freq: Three times a day (TID) | ORAL | Status: DC

## 2022-04-24 MED ORDER — ACETAMINOPHEN 650 MG RE SUPP: 650.0000 mg | Freq: Four times a day (QID) | RECTAL | Status: DC | PRN

## 2022-04-24 MED ORDER — FOLIC ACID 1 MG PO TABS: 1.0000 mg | ORAL_TABLET | Freq: Every day | ORAL | Status: DC

## 2022-04-24 MED ORDER — TRAZODONE HCL 50 MG PO TABS: 100.0000 mg | ORAL_TABLET | Freq: Every day | ORAL | Status: DC

## 2022-04-24 MED ORDER — LORAZEPAM 2 MG PO TABS
0.0000 mg | ORAL_TABLET | ORAL | Status: DC
  Administered 2022-04-24: 2 mg via ORAL
  Administered 2022-04-24 (×2): 1 mg via ORAL
  Administered 2022-04-25: 2 mg via ORAL
  Filled 2022-04-24 (×4): qty 1

## 2022-04-24 MED ORDER — ENOXAPARIN SODIUM 40 MG/0.4ML IJ SOSY
40.0000 mg | PREFILLED_SYRINGE | INTRAMUSCULAR | Status: DC
  Filled 2022-04-24: qty 0.4

## 2022-04-24 MED ORDER — LORAZEPAM 2 MG/ML IJ SOLN: 1.0000 mg | INTRAMUSCULAR | Status: DC | PRN

## 2022-04-24 MED ORDER — MAGNESIUM HYDROXIDE 400 MG/5ML PO SUSP: 30.0000 mL | Freq: Every day | ORAL | Status: DC | PRN

## 2022-04-24 MED ORDER — THIAMINE HCL 100 MG PO TABS
100.0000 mg | ORAL_TABLET | Freq: Every day | ORAL | Status: DC
  Administered 2022-04-24 – 2022-04-25 (×2): 100 mg via ORAL
  Filled 2022-04-24 (×2): qty 1

## 2022-04-24 MED ORDER — TRAZODONE HCL 50 MG PO TABS
25.0000 mg | ORAL_TABLET | Freq: Every evening | ORAL | Status: DC | PRN
  Administered 2022-04-24 (×2): 25 mg via ORAL
  Filled 2022-04-24 (×2): qty 1

## 2022-04-24 MED ORDER — CARVEDILOL 3.125 MG PO TABS: 3.1250 mg | ORAL_TABLET | Freq: Two times a day (BID) | ORAL | Status: DC

## 2022-04-24 MED ORDER — ADULT MULTIVITAMIN W/MINERALS CH
1.0000 | ORAL_TABLET | Freq: Every day | ORAL | Status: DC
  Administered 2022-04-24 – 2022-04-25 (×2): 1 via ORAL
  Filled 2022-04-24: qty 1

## 2022-04-24 MED ORDER — POTASSIUM CHLORIDE IN NACL 20-0.9 MEQ/L-% IV SOLN
INTRAVENOUS | Status: DC
  Filled 2022-04-24 (×5): qty 1000

## 2022-04-24 MED ORDER — ONDANSETRON HCL 4 MG/2ML IJ SOLN: 4.0000 mg | Freq: Four times a day (QID) | INTRAMUSCULAR | Status: DC | PRN

## 2022-04-24 MED ORDER — ONDANSETRON HCL 4 MG PO TABS: 4.0000 mg | ORAL_TABLET | Freq: Four times a day (QID) | ORAL | Status: DC | PRN

## 2022-04-24 MED ORDER — LEVETIRACETAM 500 MG PO TABS
500.0000 mg | ORAL_TABLET | Freq: Two times a day (BID) | ORAL | Status: DC
  Administered 2022-04-24 – 2022-04-25 (×3): 500 mg via ORAL
  Filled 2022-04-24 (×4): qty 1

## 2022-04-24 MED ORDER — ACETAMINOPHEN 325 MG PO TABS: 650.0000 mg | ORAL_TABLET | Freq: Four times a day (QID) | ORAL | Status: DC | PRN

## 2022-04-24 MED ORDER — FOLIC ACID 1 MG PO TABS
1.0000 mg | ORAL_TABLET | Freq: Every day | ORAL | Status: DC
  Administered 2022-04-24 – 2022-04-25 (×2): 1 mg via ORAL
  Filled 2022-04-24 (×2): qty 1

## 2022-04-24 MED ORDER — THIAMINE HCL 100 MG/ML IJ SOLN: 100.0000 mg | Freq: Every day | INTRAMUSCULAR | Status: DC

## 2022-04-24 MED ORDER — CARVEDILOL 3.125 MG PO TABS
3.1250 mg | ORAL_TABLET | Freq: Two times a day (BID) | ORAL | Status: DC
  Administered 2022-04-24 – 2022-04-25 (×2): 3.125 mg via ORAL
  Filled 2022-04-24 (×2): qty 1

## 2022-04-24 NOTE — TOC Progression Note (Signed)
Transition of Care (TOC) - Progression Note    Patient Details  Name: Adam [name] [name]. MRN: [id number] Date of Birth: 1986/06/26  Transition of Care [facility]) CM/SW Contact  Bing Quarry, RN Phone Number: 04/24/2022, 3:13 PM  Clinical Narrative:  6/3: TOC consult for substance abuse (ETOH). Place information and resources in AVS summary. Gabriel Cirri RN CM           Expected Discharge Plan and Services                                                 Social Determinants of Health (SDOH) Interventions    Readmission Risk Interventions     View : No data to display.

## 2022-04-24 NOTE — H&P (Addendum)
Therapy and     House   PATIENT NAME: Adam [name]    MR#:  [id number]  DATE OF BIRTH:  Jan 29, 1986  DATE OF ADMISSION:  04/23/2022  PRIMARY CARE PHYSICIAN: Patient, No Pcp Per (Inactive)   Patient is coming from: Home  REQUESTING/REFERRING PHYSICIAN: Hinda Kehr, MD  CHIEF COMPLAINT:   Chief Complaint  Patient presents with   Seizures    HISTORY OF PRESENT ILLNESS:  Adam Asbury Grissom [name] is a 36 y.o. male with medical history significant for essential hypertension, alcohol abuse, seizure disorder and bulimia, who presented to the ER with acute onset of fall likely with associated seizures and head injury.  He came to the ER by private vehicle and while there he had a phone call and went outside.  While he was outside he was seen having a seizure-like episode on the ground.  He was then brought to room when his seizure stopped.  He was then back to his baseline.  He initially stated that usually drinks 1/5 of liquor per day and stated that he decided to quit drinking yesterday however his alcohol level was 258.  He has not had a seizure in years.  He remember the last time he had it was when he stopped drinking alcohol.  He was having mild headache and nausea with vomiting once.  No chest pain or palpitations.  No cough or wheezing or dyspnea.  He was fairly somnolent during the interview and later on was more alert.  He admits to left flank pain but denies any dysuria, oliguria, urinary frequency or urgency or flank pain.  He admitted to the visual hallucinations during the few hours he stayed in the ER.  He denied any paresthesias or focal muscle weakness, tinnitus or vertigo, urinary or stool incontinence.  He later admitted to drinking 1/5 yesterday that is less than his usual amount.  ED Course: When he came to the ER vital signs were within normal and later on BP was 88/40 then 99/66.  Labs revealed an AST of 147 and ALT of 111 compared to normal levels on 03/20/2022.   CBC was within normal.  Alcohol level was 258 as above EKG as reviewed by me : EKG showed normal sinus rhythm with a rate of 95 with anteroseptal Q waves. Imaging: Noncontrast head CT scan revealed mild but age advanced atrophy with no acute recurrent abnormalities.  C-spine CT showed reversal of cervical lordosis with no acute osseous abnormality.  He was given 100 mg of IV thiamine and 2 mg of IV Ativan.  He will be admitted to a medical telemetry bed for further evaluation and management. PAST MEDICAL HISTORY:   Past Medical History:  Diagnosis Date   Eating disorder    history of bulemia   HYPERTENSION 06/25/2010   Hypertension   -Alcohol abuse.  PAST SURGICAL HISTORY:  History reviewed. No pertinent surgical history.  No previous surgeries.  SOCIAL HISTORY:   Social History   Tobacco Use   Smoking status: Former   Smokeless tobacco: Current  Substance Use Topics   Alcohol use: Yes    Alcohol/week: 70.0 standard drinks    Types: 70 Shots of liquor per week    Comment: pt stated half gallon of bourbon daily but last drink yesterday -03/18/22    FAMILY HISTORY:   Family History  Problem Relation Age of Onset   Cancer Mother        lung   Hypertension Father  DRUG ALLERGIES:  No Known Allergies  REVIEW OF SYSTEMS:   ROS As per history of present illness. All pertinent systems were reviewed above. Constitutional, HEENT, cardiovascular, respiratory, GI, GU, musculoskeletal, neuro, psychiatric, endocrine, integumentary and hematologic systems were reviewed and are otherwise negative/unremarkable except for positive findings mentioned above in the HPI.   MEDICATIONS AT HOME:   Prior to Admission medications   Medication Sig Start Date End Date Taking? Authorizing Provider  carvedilol (COREG) 3.125 MG tablet Take 1 tablet (3.125 mg total) by mouth 2 (two) times daily. 03/20/22 03/20/23  Thurnell Lose, MD  folic acid (FOLVITE) 1 MG tablet Take 1 tablet (1 mg total)  by mouth daily. 03/20/22   Thurnell Lose, MD  levETIRAcetam (KEPPRA) 500 MG tablet Take 1 tablet (500 mg total) by mouth 2 (two) times daily. 03/20/22   Thurnell Lose, MD  thiamine 100 MG tablet Take 1 tablet (100 mg total) by mouth daily. 03/21/22   Thurnell Lose, MD  traZODone (DESYREL) 100 MG tablet Take 1 tablet (100 mg total) by mouth at bedtime. 03/20/22   Thurnell Lose, MD      VITAL SIGNS:  Blood pressure 99/66, pulse (!) 113, temperature 98.4 F (36.9 C), temperature source Oral, resp. rate 16, height 5\' 8"  (1.727 m), weight 48.1 kg, SpO2 97 %.  PHYSICAL EXAMINATION:  Physical Exam  GENERAL:  36 y.o.-year-old Caucasian male patient lying in the bed with no acute distress.  He was somnolent but arousable. EYES: Pupils equal, round, reactive to light and accommodation. No scleral icterus. Extraocular muscles intact.  HEENT: Head atraumatic, normocephalic. Oropharynx and nasopharynx clear.  NECK:  Supple, no jugular venous distention. No thyroid enlargement, no tenderness.  LUNGS: Normal breath sounds bilaterally, no wheezing, rales,rhonchi or crepitation. No use of accessory muscles of respiration.  CARDIOVASCULAR: Regular rate and rhythm, S1, S2 normal. No murmurs, rubs, or gallops.  ABDOMEN: Soft, nondistended, nontender. Bowel sounds present. No organomegaly or mass.  EXTREMITIES: No pedal edema, cyanosis, or clubbing.  NEUROLOGIC: Cranial nerves II through XII are intact. Muscle strength 5/5 in all extremities. Sensation intact. Gait not checked.  PSYCHIATRIC: The patient is alert and oriented x 3.  Normal affect and good eye contact. SKIN: No obvious rash, lesion, or ulcer.   LABORATORY PANEL:   CBC Recent Labs  Lab 04/23/22 2354  WBC 6.4  HGB 15.4  HCT 44.4  PLT 202   ------------------------------------------------------------------------------------------------------------------  Chemistries  Recent Labs  Lab 04/23/22 2354  NA 139  K 3.6  CL 101   CO2 28  GLUCOSE 87  BUN 15  CREATININE 1.15  CALCIUM 9.5  AST 147*  ALT 111*  ALKPHOS 68  BILITOT 1.0   ------------------------------------------------------------------------------------------------------------------  Cardiac Enzymes No results for input(s): TROPONINI in the last 168 hours. ------------------------------------------------------------------------------------------------------------------  RADIOLOGY:  CT HEAD WO CONTRAST (5MM)  Result Date: 04/24/2022 CLINICAL DATA:  Head trauma, intracranial venous injury suspected EXAM: CT HEAD WITHOUT CONTRAST TECHNIQUE: Contiguous axial images were obtained from the base of the skull through the vertex without intravenous contrast. RADIATION DOSE REDUCTION: This exam was performed according to the departmental dose-optimization program which includes automated exposure control, adjustment of the mA and/or kV according to patient size and/or use of iterative reconstruction technique. COMPARISON:  Head CT 03/18/2022 FINDINGS: Brain: Mild but age advanced atrophy. No intracranial hemorrhage, mass effect, or midline shift. No hydrocephalus. The basilar cisterns are patent. No evidence of territorial infarct or acute ischemia. No extra-axial or intracranial fluid  collection. Vascular: No hyperdense vessel or unexpected calcification. Skull: No fracture or focal lesion. Sinuses/Orbits: No acute findings. Other: No confluent scalp contusion. IMPRESSION: 1. No acute intracranial abnormality. No skull fracture. 2. Mild but age advanced atrophy. Electronically Signed   By: Keith Rake M.D.   On: 04/24/2022 01:22   CT Cervical Spine Wo Contrast  Result Date: 04/24/2022 CLINICAL DATA:  Neck trauma EXAM: CT CERVICAL SPINE WITHOUT CONTRAST TECHNIQUE: Multidetector CT imaging of the cervical spine was performed without intravenous contrast. Multiplanar CT image reconstructions were also generated. RADIATION DOSE REDUCTION: This exam was performed  according to the departmental dose-optimization program which includes automated exposure control, adjustment of the mA and/or kV according to patient size and/or use of iterative reconstruction technique. COMPARISON:  None Available. FINDINGS: Alignment: Mild reversal of cervical lordosis. No subluxation. Facet alignment within normal limits. Skull base and vertebrae: No acute fracture. No primary bone lesion or focal pathologic process. Soft tissues and spinal canal: No prevertebral fluid or swelling. No visible canal hematoma. Disc levels:  Within normal limits. Upper chest: Negative. Other: None IMPRESSION: Reversal of cervical lordosis.  No acute osseous abnormality Electronically Signed   By: Donavan Foil M.D.   On: 04/24/2022 01:33      IMPRESSION AND PLAN:  Assessment and Plan: * Alcohol withdrawal seizure ([place]) - He will be admitted to a medical telemetry bed. - Seizure precautions will be applied. - He will be placed on as needed IV Ativan. - We will place him on p.o. thiamine, multivitamins and folic acid. - EEG and neurology consult will be obtained. - I notified Dr. Cheral Marker about the patient. - We will continue his Keppra.  Transaminitis - This leg secondary to alcoholic hepatitis. 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 - We will utilize IM Haldol as needed.  Alcohol abuse - I counseled him for cessation he will receive further counseling. - He will be monitored for alcohol withdrawal. - We will place him on as needed IV Ativan and scheduled thiamine, multivitamins and folic acid.  Essential hypertension - We will continue lisinopril and Coreg.   DVT prophylaxis: Lovenox.  Advanced Care Planning:  Code Status: full code.  Family Communication:  The plan of care was discussed in details with the patient (and family). I answered all questions. The patient agreed to proceed with  the above mentioned plan. Further management will depend upon hospital course. Disposition Plan: Back to previous home environment Consults called: Neurology. All the records are reviewed and case discussed with ED provider.  Status is: Inpatient  At the time of the admission, it appears that the appropriate admission status for this patient is inpatient.  This is judged to be reasonable and necessary in order to provide the required intensity of service to ensure the patient's safety given the presenting symptoms, physical exam findings and initial radiographic and laboratory data in the context of comorbid conditions.  The patient requires inpatient status due to high intensity of service, high risk of further deterioration and high frequency of surveillance required.  I certify that at the time of admission, it is my clinical judgment that the patient will require inpatient hospital care extending more than 2 midnights.                            Dispo: The patient is from: Home  Anticipated d/c is to: Home              Patient currently is not medically stable to d/c.              Difficult to place patient: No  Christel Mormon M.D on 04/24/2022 at 4:44 AM  [facility]   From 7 PM-7 AM, contact night-coverage [email or web address]  CC: Primary care physician; Patient, No Pcp Per (Inactive)

## 2022-04-24 NOTE — Assessment & Plan Note (Signed)
-   I counseled him for cessation he will receive further counseling. - He will be monitored for alcohol withdrawal. - We will place him on as needed IV Ativan and scheduled thiamine, multivitamins and folic acid.

## 2022-04-24 NOTE — Consult Note (Addendum)
NEURO HOSPITALIST CONSULT NOTE   Requestig physician: Dr. Guss Bunde  Reason for Consult: Breakthrough seizure, possibly related to alcohol withdrawal  History obtained from:   Patient and Chart     HPI:                                                                                                                                          Adam Frisch Wulf Montez Hageman. is an 36 y.o. male with a history of alcohol abuse, bulimia and HTN who presented to the ED on Friday night after having had a seizure at home. He has had alcohol withdrawal seizures in the past, and state that his last drink had been on Thursday morning, followed by a seizure on Thursday night. He states that he was trying to quit due to expecting the birth of his child in about 2 weeks. At some point he hit his head, possibly due to a "near syncopal episode" per chart. He was brought to the ED by his significant other on Friday night for evaluation. Before he could make it into the ED, he had another seizure and was found outside in the ED parking lot with seizure-like activity, which later stopped spontaneously. He states that he has had about 5 total EtOH withdrawal seizures since his first one about 10 years ago. He states that all of his seizures have been associated with alcohol withdrawal. He endorses drinking a 5th of liquor per day.   In the ED, he was given 2 mg IV Ativan. Seizure precautions were ordered. CIWA protocol was also ordered, along with order for thiamine 100 mg IV.  EtOH level in the ED was 258. AST was 147 and ALT 111.   Noncontrast head CT scan revealed mild but age advanced atrophy with no acute recurrent abnormalities.  C-spine CT showed reversal of cervical lordosis with no acute osseous abnormality.  Past Medical History:  Diagnosis Date   Eating disorder    history of bulemia   HYPERTENSION 06/25/2010   Hypertension     History reviewed. No pertinent surgical history.  Family History   Problem Relation Age of Onset   Cancer Mother        lung   Hypertension Father             Social History:  reports that he has quit smoking. He uses smokeless tobacco. He reports current alcohol use of about 70.0 standard drinks per week. He reports that he does not currently use drugs.  No Known Allergies  MEDICATIONS:  Prior to Admission:  Medications Prior to Admission  Medication Sig Dispense Refill Last Dose   carvedilol (COREG) 3.125 MG tablet Take 1 tablet (3.125 mg total) by mouth 2 (two) times daily. 60 tablet 11    folic acid (FOLVITE) 1 MG tablet Take 1 tablet (1 mg total) by mouth daily. 30 tablet 0    levETIRAcetam (KEPPRA) 500 MG tablet Take 1 tablet (500 mg total) by mouth 2 (two) times daily. 60 tablet 0    thiamine 100 MG tablet Take 1 tablet (100 mg total) by mouth daily. 30 tablet 0    traZODone (DESYREL) 100 MG tablet Take 1 tablet (100 mg total) by mouth at bedtime. 30 tablet 0    Scheduled:  carvedilol  3.125 mg Oral BID   enoxaparin (LOVENOX) injection  40 mg Subcutaneous Q24H   folic acid  1 mg Oral Daily   levETIRAcetam  500 mg Oral BID   LORazepam  0-4 mg Oral Q4H   Followed by   Melene Muller ON 04/26/2022] LORazepam  0-4 mg Oral Q8H   multivitamin with minerals  1 tablet Oral Daily   thiamine  100 mg Oral Daily   Or   thiamine  100 mg Intravenous Daily   Continuous:  0.9 % NaCl with KCl 20 mEq / L 75 mL/hr at 04/24/22 0655     ROS:                                                                                                                                       As per HPI.   Blood pressure (!) 78/65, pulse 92, temperature 98 F (36.7 C), resp. rate 16, height  (1.727 m), weight 74.3 kg, SpO2 96 %.   General Examination:                                                                                                        Physical Exam  HEENT-  Normocephalic. Red spot on forehead that patient states is due to striking his head during a fall.    Lungs- Respirations unlabored Extremities- No edema  Neurological Examination Mental Status: Awake, alert and oriented. Speech fluent with intact comprehension and naming.  Cranial Nerves: II: Temporal visual fields intact with no extinction to DSS. PERRL.   III,IV, VI: No ptosis. EOMI without nystagmus.  V: Temp sensation equal bilaterally.  VII: Smile symmetric  VIII: Hearing intact to voice IX,X: No hypophonia XI: Symmetric shoulder shrug XII: Midline  tongue extension Motor: RUE 5/5 proximally and distally RLE 5/5 proximally and distally LUE 5/5 proximally and distally LLE 5/5 proximally and distally No pronator drift Sensory: Gross touch sensation intact throughout, bilaterally Deep Tendon Reflexes: 2+ and symmetric throughout Cerebellar: No ataxia with FNF bilaterally  Gait: Deferred    Lab Results: Basic Metabolic Panel: Recent Labs  Lab 04/23/22 2354 04/24/22 0432  NA 139 138  K 3.6 3.7  CL 101 102  CO2 28 24  GLUCOSE 87 107*  BUN 15 18  CREATININE 1.15 1.45*  CALCIUM 9.5 9.7    CBC: Recent Labs  Lab 04/23/22 2354 04/24/22 0432  WBC 6.4 5.2  NEUTROABS 3.0  --   HGB 15.4 14.9  HCT 44.4 43.9  MCV 98.0 98.0  PLT 202 191    Cardiac Enzymes: No results for input(s): CKTOTAL, CKMB, CKMBINDEX, TROPONINI in the last 168 hours.  Lipid Panel: No results for input(s): CHOL, TRIG, HDL, CHOLHDL, VLDL, LDLCALC in the last 168 hours.  Imaging: CT HEAD WO CONTRAST (5MM)  Result Date: 04/24/2022 CLINICAL DATA:  Head trauma, intracranial venous injury suspected EXAM: CT HEAD WITHOUT CONTRAST TECHNIQUE: Contiguous axial images were obtained from the base of the skull through the vertex without intravenous contrast. RADIATION DOSE REDUCTION: This exam was performed according to the departmental dose-optimization program which includes  automated exposure control, adjustment of the mA and/or kV according to patient size and/or use of iterative reconstruction technique. COMPARISON:  Head CT 03/18/2022 FINDINGS: Brain: Mild but age advanced atrophy. No intracranial hemorrhage, mass effect, or midline shift. No hydrocephalus. The basilar cisterns are patent. No evidence of territorial infarct or acute ischemia. No extra-axial or intracranial fluid collection. Vascular: No hyperdense vessel or unexpected calcification. Skull: No fracture or focal lesion. Sinuses/Orbits: No acute findings. Other: No confluent scalp contusion. IMPRESSION: 1. No acute intracranial abnormality. No skull fracture. 2. Mild but age advanced atrophy. Electronically Signed   By: Narda RutherfordMelanie  Sanford M.D.   On: 04/24/2022 01:22   CT Cervical Spine Wo Contrast  Result Date: 04/24/2022 CLINICAL DATA:  Neck trauma EXAM: CT CERVICAL SPINE WITHOUT CONTRAST TECHNIQUE: Multidetector CT imaging of the cervical spine was performed without intravenous contrast. Multiplanar CT image reconstructions were also generated. RADIATION DOSE REDUCTION: This exam was performed according to the departmental dose-optimization program which includes automated exposure control, adjustment of the mA and/or kV according to patient size and/or use of iterative reconstruction technique. COMPARISON:  None Available. FINDINGS: Alignment: Mild reversal of cervical lordosis. No subluxation. Facet alignment within normal limits. Skull base and vertebrae: No acute fracture. No primary bone lesion or focal pathologic process. Soft tissues and spinal canal: No prevertebral fluid or swelling. No visible canal hematoma. Disc levels:  Within normal limits. Upper chest: Negative. Other: None IMPRESSION: Reversal of cervical lordosis.  No acute osseous abnormality Electronically Signed   By: Jasmine PangKim  Fujinaga M.D.   On: 04/24/2022 01:33     Assessment: 36 year old with a history of EtOH withdrawal seizures, presents with  seizure recurrence after stating that he quit EtOH cold [place] the day before. However, EtOH level was elevated in the ED, suggesting either an EtOH induced seizure, poor/inaccurate history provided by patient, and underlying epilepsy.  1. Exam is nonfocal.  2. CT head was unremarkable 3. Transaminitis on labs 4. Na and Ca normal on initial ED labs.  5. No leukocytosis on CBC 6. UTOX negative 7. EtOH elevated at 258 in the ED.   Recommendations: 1. Inpatient seizure precautions  2. MRI brain with and without contrast (ordered) 3. EEG 4. Continue Keppra 500 mg BID 5. Outpatient seizure precautions: Per [facility] statutes, patients with seizures are not allowed to drive until  they have been seizure-free for six months. Use caution when using heavy equipment or power tools. Avoid working on ladders or at heights. Take showers instead of baths. Ensure the water temperature is not too high on the home water heater. Do not go swimming alone. When caring for infants or small children, sit down when holding, feeding, or changing them to minimize risk of injury to the child in the event you have a seizure. Also, Maintain good sleep hygiene. Avoid alcohol. 6. CIWA protocol with PRN IV Ativan  7. Agree with starting thiamine, multivitamins and folic acid.  8. Magnesium level (ordered)  Addendum: - MRI brain w/wo contrast: Mildly advanced cerebral atrophy for age. Otherwise normal brain MRI. No acute intracranial abnormality identified. - Magnesium level normal - Outpatient alcohol cessation counseling - Outpatient Neurology follow up. If this can be scheduled in the next 1-2 weeks, then EEG may be performed as an outpatient. - Continue Keppra at discharge.    Electronically signed: Dr. Caryl Pina 04/24/2022, 8:20 AM

## 2022-04-24 NOTE — Assessment & Plan Note (Addendum)
-   We will continue lisinopril and Coreg.

## 2022-04-24 NOTE — Assessment & Plan Note (Addendum)
-   This leg secondary to alcoholic hepatitis. - We will follow LFTs with hydration RUQ [place] pending

## 2022-04-24 NOTE — Assessment & Plan Note (Addendum)
-   cont Seizure precautions CIWA protocol with scheduled ativan (begin taper) given amount of ETOH patient reported and presentation - continue thiamine, multivitamins and folic acid. - EEG and neurology consult will be obtained. - Appreciate Neurology evaluation and recommendations MRI normal EEG pending. - We will continue his Keppra. -- PT/OT

## 2022-04-24 NOTE — Progress Notes (Signed)
[facility]  - [place] at [facility]   PATIENT NAME: Adam [name]    MR#:  [id number]  DATE OF BIRTH:  04/13/1986  SUBJECTIVE:   Patient seen and Evaluated on AM rounds, admitted couple hours earlier. Doing well resting this AM. Denies acute complaints. Drowsy, but arousable.  No focal neuro  deficits   VITALS:  Blood pressure 112/78, pulse 92, temperature 98 F (36.7 C), temperature source Oral, resp. rate 16, height 5\' 8"  (1.727 m), weight 74.3 kg, SpO2 97 %.  PHYSICAL EXAMINATION:   GENERAL:  36 y.o.-year-old patient lying in the bed with no acute distress.  LUNGS: Normal breath sounds bilaterally, no wheezing, rales, rhonchi.  CARDIOVASCULAR: S1, S2 normal. No murmurs, rubs, or gallops.  ABDOMEN: Soft, nontender, nondistended. Bowel sounds present.  EXTREMITIES: No  edema b/l.    NEUROLOGIC: nonfocal  patient is alert and awake SKIN: No obvious rash, lesion, or ulcer.   LABORATORY PANEL:  CBC Recent Labs  Lab 04/24/22 0432  WBC 5.2  HGB 14.9  HCT 43.9  PLT 191    Chemistries  Recent Labs  Lab 04/24/22 0432  NA 138  K 3.7  CL 102  CO2 24  GLUCOSE 107*  BUN 18  CREATININE 1.45*  CALCIUM 9.7  AST 132*  ALT 105*  ALKPHOS 59  BILITOT 0.8   Cardiac Enzymes No results for input(s): TROPONINI in the last 168 hours. RADIOLOGY:  CT HEAD WO CONTRAST (06/24/22)  Result Date: 04/24/2022 CLINICAL DATA:  Head trauma, intracranial venous injury suspected EXAM: CT HEAD WITHOUT CONTRAST TECHNIQUE: Contiguous axial images were obtained from the base of the skull through the vertex without intravenous contrast. RADIATION DOSE REDUCTION: This exam was performed according to the departmental dose-optimization program which includes automated exposure control, adjustment of the mA and/or kV according to patient size and/or use of iterative reconstruction technique. COMPARISON:  Head CT 03/18/2022 FINDINGS: Brain: Mild but age advanced atrophy. No intracranial  hemorrhage, mass effect, or midline shift. No hydrocephalus. The basilar cisterns are patent. No evidence of territorial infarct or acute ischemia. No extra-axial or intracranial fluid collection. Vascular: No hyperdense vessel or unexpected calcification. Skull: No fracture or focal lesion. Sinuses/Orbits: No acute findings. Other: No confluent scalp contusion. IMPRESSION: 1. No acute intracranial abnormality. No skull fracture. 2. Mild but age advanced atrophy. Electronically Signed   By: 03/20/2022 M.D.   On: 04/24/2022 01:22   CT Cervical Spine Wo Contrast  Result Date: 04/24/2022 CLINICAL DATA:  Neck trauma EXAM: CT CERVICAL SPINE WITHOUT CONTRAST TECHNIQUE: Multidetector CT imaging of the cervical spine was performed without intravenous contrast. Multiplanar CT image reconstructions were also generated. RADIATION DOSE REDUCTION: This exam was performed according to the departmental dose-optimization program which includes automated exposure control, adjustment of the mA and/or kV according to patient size and/or use of iterative reconstruction technique. COMPARISON:  None Available. FINDINGS: Alignment: Mild reversal of cervical lordosis. No subluxation. Facet alignment within normal limits. Skull base and vertebrae: No acute fracture. No primary bone lesion or focal pathologic process. Soft tissues and spinal canal: No prevertebral fluid or swelling. No visible canal hematoma. Disc levels:  Within normal limits. Upper chest: Negative. Other: None IMPRESSION: Reversal of cervical lordosis.  No acute osseous abnormality Electronically Signed   By: 06/24/2022 M.D.   On: 04/24/2022 01:33    Assessment and Plan  Alcohol withdrawal seizure [facility]) - He will be admitted to a medical telemetry bed. - Seizure precautions will be applied. -  He will be placed on as needed IV Ativan. - We will place him on p.o. thiamine, multivitamins and folic acid. - EEG and neurology consult will be obtained. - I  notified Dr. Otelia Limes about the patient. - We will continue his Keppra.  Alcohol abuse - I counseled him for cessation he will receive further counseling. - He will be monitored for alcohol withdrawal. - We will place him on as needed IV Ativan and scheduled thiamine, multivitamins and folic acid.  Acute alcoholic hallucinosis (HCC) - At this time he does not have any more hallucinations. - We will utilize IM Haldol as needed.  Transaminitis - This leg secondary to alcoholic hepatitis. - We will follow LFTs with hydration  Essential hypertension - We will continue lisinopril and Coreg.     Procedures: Family communication : Consults : CODE STATUS:  DVT Prophylaxis : Level of care: Telemetry Medical Status is: Inpatient Remains inpatient appropriate because: Epliepsy evaluation, ETOH intoxication, ETOH withdrawal.     TOTAL TIME TAKING CARE OF THIS PATIENT: 25 minutes.  >50% time spent on counselling and coordination of care  Note: This dictation was prepared with Dragon dictation along with smaller phrase technology. Any transcriptional errors that result from this process are unintentional.  Thomas Hoff M.D    [facility]   CC: Primary care physician; Patient, No Pcp Per (Inactive)

## 2022-04-24 NOTE — TOC Progression Note (Incomplete)
Transition of Care (TOC) - Progression Note    Patient Details  Name: Adam [name] [name] MRN: [id number] Date of Birth: 12/10/1985  Transition of Care [facility]) CM/SW Contact  Bing Quarry, RN Phone Number: 04/24/2022, 4:16 PM  Clinical Narrative:            Expected Discharge Plan and Services                                                 Social Determinants of Health (SDOH) Interventions    Readmission Risk Interventions     View : No data to display.

## 2022-04-24 NOTE — Assessment & Plan Note (Signed)
-   At this time he does not have any more hallucinations. - We will utilize IM Haldol as needed.

## 2022-04-24 NOTE — ED Notes (Signed)
Dr Arville Care  at bedside at this time

## 2022-04-25 ENCOUNTER — Inpatient Hospital Stay: Payer: Self-pay

## 2022-04-25 ENCOUNTER — Encounter: Payer: Self-pay | Admitting: Radiology

## 2022-04-25 LAB — MAGNESIUM: Magnesium: 2 mg/dL (ref 1.7–2.4)

## 2022-04-25 LAB — GLUCOSE, CAPILLARY: Glucose-Capillary: 78 mg/dL (ref 70–99)

## 2022-04-25 MED ORDER — LEVETIRACETAM 500 MG PO TABS: 500.0000 mg | ORAL_TABLET | Freq: Two times a day (BID) | ORAL | 0 refills | Status: DC

## 2022-04-25 MED ORDER — GADOBUTROL 1 MMOL/ML IV SOLN
7.0000 mL | Freq: Once | INTRAVENOUS | Status: AC | PRN
  Administered 2022-04-25: 7 mL via INTRAVENOUS

## 2022-04-25 NOTE — Progress Notes (Signed)
Pt signed AMA. Stating that he "has stuff to do" and cannot wait any longer. MD was made aware earlier that pt wanted to speak with him and was wanting to leave. MD made aware that pt leaving AMA. IV removed intact. Pt gathered all belongings.

## 2022-04-25 NOTE — Progress Notes (Addendum)
[facility]  - [place] at [facility]   PATIENT NAME: Adam [name]    MR#:  [id number]  DATE OF BIRTH:  06-06-1986  SUBJECTIVE:  Seen patient earlier on AM rounds, requesting information when he would be able to DC. Had a generous discussion regarding risks of leaving home given his alcohol intoxication, habits, alcohol withdrawal seizure, neurology recommendations, need for alcohol cessation and if going against medical advice he will likely have risks of worsening medical outcome. He voiced understanding during rounds and stated he will stay for continued monitoring and further workup.  Later after initial rounds patient became impatient when family arrived and requested to leave AMA, notified by nursing staff.    VITALS:  Blood pressure 96/72, pulse 60, temperature 98.3 F (36.8 C), temperature source Oral, resp. rate 16, height 5\' 8"  (1.727 m), weight 74.3 kg, SpO2 96 %.  PHYSICAL EXAMINATION:   GENERAL:  36 y.o.-year-old patient lying in the bed with no acute distress.  LUNGS: Normal breath sounds bilaterally, no wheezing, rales, rhonchi.  CARDIOVASCULAR: S1, S2 normal. No murmurs, rubs, or gallops.  ABDOMEN: Soft, nontender, nondistended. Bowel sounds present.  EXTREMITIES: No  edema b/l.    NEUROLOGIC: nonfocal  patient is alert and awake SKIN: No obvious rash, lesion, or ulcer.   LABORATORY PANEL:  CBC Recent Labs  Lab 04/24/22 0432  WBC 5.2  HGB 14.9  HCT 43.9  PLT 191    Chemistries  Recent Labs  Lab 04/24/22 0432 04/25/22 0006  NA 138  --   K 3.7  --   CL 102  --   CO2 24  --   GLUCOSE 107*  --   BUN 18  --   CREATININE 1.45*  --   CALCIUM 9.7  --   MG  --  2.0  AST 132*  --   ALT 105*  --   ALKPHOS 59  --   BILITOT 0.8  --    Cardiac Enzymes No results for input(s): TROPONINI in the last 168 hours. RADIOLOGY:  CT HEAD WO CONTRAST (06/25/22)  Result Date: 04/24/2022 CLINICAL DATA:  Head trauma, intracranial venous injury  suspected EXAM: CT HEAD WITHOUT CONTRAST TECHNIQUE: Contiguous axial images were obtained from the base of the skull through the vertex without intravenous contrast. RADIATION DOSE REDUCTION: This exam was performed according to the departmental dose-optimization program which includes automated exposure control, adjustment of the mA and/or kV according to patient size and/or use of iterative reconstruction technique. COMPARISON:  Head CT 03/18/2022 FINDINGS: Brain: Mild but age advanced atrophy. No intracranial hemorrhage, mass effect, or midline shift. No hydrocephalus. The basilar cisterns are patent. No evidence of territorial infarct or acute ischemia. No extra-axial or intracranial fluid collection. Vascular: No hyperdense vessel or unexpected calcification. Skull: No fracture or focal lesion. Sinuses/Orbits: No acute findings. Other: No confluent scalp contusion. IMPRESSION: 1. No acute intracranial abnormality. No skull fracture. 2. Mild but age advanced atrophy. Electronically Signed   By: 03/20/2022 M.D.   On: 04/24/2022 01:22   CT Cervical Spine Wo Contrast  Result Date: 04/24/2022 CLINICAL DATA:  Neck trauma EXAM: CT CERVICAL SPINE WITHOUT CONTRAST TECHNIQUE: Multidetector CT imaging of the cervical spine was performed without intravenous contrast. Multiplanar CT image reconstructions were also generated. RADIATION DOSE REDUCTION: This exam was performed according to the departmental dose-optimization program which includes automated exposure control, adjustment of the mA and/or kV according to patient size and/or use of iterative reconstruction technique. COMPARISON:  None Available.  FINDINGS: Alignment: Mild reversal of cervical lordosis. No subluxation. Facet alignment within normal limits. Skull base and vertebrae: No acute fracture. No primary bone lesion or focal pathologic process. Soft tissues and spinal canal: No prevertebral fluid or swelling. No visible canal hematoma. Disc levels:   Within normal limits. Upper chest: Negative. Other: None IMPRESSION: Reversal of cervical lordosis.  No acute osseous abnormality Electronically Signed   By: Jasmine Pang M.D.   On: 04/24/2022 01:33   MR BRAIN W WO CONTRAST  Result Date: 04/25/2022 CLINICAL DATA:  Initial evaluation for seizure disorder, clinical change. EXAM: MRI HEAD WITHOUT AND WITH CONTRAST TECHNIQUE: Multiplanar, multiecho pulse sequences of the brain and surrounding structures were obtained without and with intravenous contrast. CONTRAST:  18mL GADAVIST GADOBUTROL 1 MMOL/ML IV SOLN COMPARISON:  Prior CT from 04/24/2022. FINDINGS: Brain: Mildly advanced cerebral atrophy for age. No focal parenchymal signal abnormality. No foci of restricted diffusion to suggest acute or subacute ischemia or changes related cute seizure. Gray-white matter differentiation maintained. No encephalomalacia to suggest chronic cortical infarction or other insult. No visible cortical dysplasia or other migrational abnormality. No acute or chronic intracranial blood products. No mass lesion, midline shift or mass effect. No hydrocephalus or extra-axial fluid collection. Pituitary gland suprasellar region normal. Midline structures intact and normally formed. No intrinsic temporal lobe abnormality. No abnormal enhancement. Vascular: Major intracranial vascular flow voids are maintained. Skull and upper cervical spine: Cranial junction normal. Bone marrow signal intensity within normal limits. No scalp soft tissue abnormality. Sinuses/Orbits: Globes orbital soft tissues within normal limits. Scattered mucosal thickening noted about the ethmoidal air cells and maxillary sinuses. No significant mastoid effusion. Other: None. IMPRESSION: 1. Mildly advanced cerebral atrophy for age. 2. Otherwise normal brain MRI. No acute intracranial abnormality identified. Electronically Signed   By: Rise Mu M.D.   On: 04/25/2022 01:18   US Abdomen Limited RUQ  (LIVER/GB)  Result Date: 04/24/2022 CLINICAL DATA:  Pain.  Nausea vomiting. EXAM: ULTRASOUND ABDOMEN LIMITED RIGHT UPPER QUADRANT COMPARISON:  None Available. FINDINGS: Gallbladder: No gallstones or wall thickening visualized. No sonographic Murphy sign noted by sonographer. Common bile duct: Diameter: 4.2 mm Liver: Diffuse increased echogenicity. No focal mass. Portal vein is patent on color Doppler imaging with normal direction of blood flow towards the liver. Other: None. IMPRESSION: 1. No cause for pain identified. 2. Diffuse increased echogenicity in the liver is nonspecific but often due to hepatic steatosis. Electronically Signed   By: Gerome Sam III M.D.   On: 04/24/2022 11:55    Assessment and Plan  Alcohol withdrawal seizure (HCC) - cont Seizure precautions CIWA protocol with scheduled ativan (begin taper) given amount of ETOH patient reported and presentation - continue thiamine, multivitamins and folic acid. - EEG and neurology consult will be obtained. - Appreciate Neurology evaluation and recommendations MRI normal EEG pending. - We will continue his Keppra. -- PT/OT  Alcohol abuse - I counseled him for cessation he will receive further counseling. - He will be monitored for alcohol withdrawal. - We will place him on as needed IV Ativan and scheduled thiamine, multivitamins and folic acid.  Acute alcoholic hallucinosis (HCC) - At this time he does not have any more hallucinations. - We will utilize IM Haldol as needed.  Transaminitis - This leg secondary to alcoholic hepatitis. - We will follow LFTs with hydration RUQ [place] pending  Essential hypertension - We will continue lisinopril and Coreg.   DVT Prophylaxis : lovenox Level of care: Telemetry Medical Status is: Inpatient  Remains inpatient appropriate because: Epliepsy evaluation, ETOH intoxication, ETOH withdrawal.     TOTAL TIME TAKING CARE OF THIS PATIENT: 25 minutes.  >50% time spent on counselling and  coordination of care  Note: This dictation was prepared with Dragon dictation along with smaller phrase technology. Any transcriptional errors that result from this process are unintentional.  Thomas HoffASEEM Seth Friedlander M.D    [facility]   CC: Primary care physician; Patient, No Pcp Per (Inactive)

## 2022-04-25 NOTE — Discharge Summary (Signed)
Physician Discharge Summary   Patient: Adam PeoplesStephen Dean Whitby Jr. MRN: [id number] DOB: 1986/10/15  Admit date:     04/23/2022  Discharge date: 04/25/22  Discharge Physician: Adam HoffASEEM Zyanya [name]   PCP: Patient, No Pcp Per (Inactive)   Discharge Diagnoses: Principal Problem:   Alcohol withdrawal seizure (HCC) Active Problems:   Transaminitis   Acute alcoholic hallucinosis (HCC)   Essential hypertension   Alcohol abuse  Resolved Problems:   * No resolved hospital problems. [facility]*  Hospital Course: Notified by nursing staff that patient would like to leave Against medical advice.  Adam PeoplesStephen Dean Rama Jr. has made the decision for the patient to leave the emergency department against the advice of Adam BundeKaul, MD.  He has been informed and understands the inherent risks, including death.  He has decided to accept the responsibility for this decision. Adam PeoplesStephen Dean Kelly ServicesHeitger Jr. and all necessary parties have been advised that he may return for further evaluation or treatment. His condition at time of discharge was Fair.  Adam PeoplesStephen Dean [facility] ServicesHeitger Jr. had current vital signs as follows:  Blood pressure (!) 131/97, pulse 75, temperature 97.8 F (36.6 C), resp. rate 18, height 5\' 8"  (1.727 m), weight 74.3 kg, SpO2 96 %.   Adam PeoplesStephen Dean Kubicki Jr. or his authorized caregiver has signed the Leaving Against Medical Advice form prior to leaving the department.  Adam [name] 04/25/2022     Assessment and Plan: * Alcohol withdrawal seizure [facility](HCC) Needs outpatient neurology appointment. Declined EEG evaluation and further workup. 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 - We will follow LFTs with hydration RUQ US Diffuse increased echogenicity in the liver is nonspecific but often due to hepatic steatosis.   Alcohol abuse - counseled heavily him for cessation he will receive further counseling; Rehab options dicussed - declined further care at this time  Essential hypertension - We  will continue lisinopril and Coreg.     Pain control - [employer] [place] Controlled Substance Reporting System database was reviewed. and patient was instructed, not to drive, operate heavy machinery, perform activities at heights, swimming or participation in water activities or provide baby-sitting services while on Pain, Sleep and Anxiety Medications; until their outpatient Physician has advised to do so again. Also recommended to not to take more than prescribed Pain, Sleep and Anxiety Medications.  Consultants: Neurology Procedures performed: n/a  Disposition: Home Diet recommendation:  Carb modified diet DISCHARGE MEDICATION: Allergies as of 04/25/2022   No Known Allergies      Medication List     TAKE these medications    carvedilol 3.125 MG tablet Commonly known as: Coreg Take 1 tablet (3.125 mg total) by mouth 2 (two) times daily.   folic acid 1 MG tablet Commonly known as: FOLVITE Take 1 tablet (1 mg total) by mouth daily.   levETIRAcetam 500 MG tablet Commonly known as: Keppra Take 1 tablet (500 mg total) by mouth 2 (two) times daily.   thiamine 100 MG tablet Take 1 tablet (100 mg total) by mouth daily.   traZODone 100 MG tablet Commonly known as: DESYREL Take 1 tablet (100 mg total) by mouth at bedtime.        Follow-up Information     [facility] .   Specialty: Radiology Contact information: [address] [id number] [place] [id number] [phone]               [facility]   04/23/22 2349 04/24/22 0507  Weight: 48.1  kg 74.3 kg   Unable to complete given leaving AMA  Condition at discharge: fair  The results of significant diagnostics from this hospitalization (including imaging, microbiology, ancillary and laboratory) are listed below for reference.   Imaging Studies: CT HEAD WO CONTRAST ( )  Result Date: 04/24/2022 CLINICAL DATA:  Head trauma, intracranial venous  injury suspected EXAM: CT HEAD WITHOUT CONTRAST TECHNIQUE: Contiguous axial images were obtained from the base of the skull through the vertex without intravenous contrast. RADIATION DOSE REDUCTION: This exam was performed according to the departmental dose-optimization program which includes automated exposure control, adjustment of the mA and/or kV according to patient size and/or use of iterative reconstruction technique. COMPARISON:  Head CT 03/18/2022 FINDINGS: Brain: Mild but age advanced atrophy. No intracranial hemorrhage, mass effect, or midline shift. No hydrocephalus. The basilar cisterns are patent. No evidence of territorial infarct or acute ischemia. No extra-axial or intracranial fluid collection. Vascular: No hyperdense vessel or unexpected calcification. Skull: No fracture or focal lesion. Sinuses/Orbits: No acute findings. Other: No confluent scalp contusion. IMPRESSION: 1. No acute intracranial abnormality. No skull fracture. 2. Mild but age advanced atrophy. Electronically Signed   By: Narda Rutherford M.D.   On: 04/24/2022 01:22   CT Cervical Spine Wo Contrast  Result Date: 04/24/2022 CLINICAL DATA:  Neck trauma EXAM: CT CERVICAL SPINE WITHOUT CONTRAST TECHNIQUE: Multidetector CT imaging of the cervical spine was performed without intravenous contrast. Multiplanar CT image reconstructions were also generated. RADIATION DOSE REDUCTION: This exam was performed according to the departmental dose-optimization program which includes automated exposure control, adjustment of the mA and/or kV according to patient size and/or use of iterative reconstruction technique. COMPARISON:  None Available. FINDINGS: Alignment: Mild reversal of cervical lordosis. No subluxation. Facet alignment within normal limits. Skull base and vertebrae: No acute fracture. No primary bone lesion or focal pathologic process. Soft tissues and spinal canal: No prevertebral fluid or swelling. No visible canal hematoma. Disc  levels:  Within normal limits. Upper chest: Negative. Other: None IMPRESSION: Reversal of cervical lordosis.  No acute osseous abnormality Electronically Signed   By: Jasmine Pang M.D.   On: 04/24/2022 01:33   MR BRAIN W WO CONTRAST  Result Date: 04/25/2022 CLINICAL DATA:  Initial evaluation for seizure disorder, clinical change. EXAM: MRI HEAD WITHOUT AND WITH CONTRAST TECHNIQUE: Multiplanar, multiecho pulse sequences of the brain and surrounding structures were obtained without and with intravenous contrast. CONTRAST:  74mL GADAVIST GADOBUTROL 1 MMOL/ML IV SOLN COMPARISON:  Prior CT from 04/24/2022. FINDINGS: Brain: Mildly advanced cerebral atrophy for age. No focal parenchymal signal abnormality. No foci of restricted diffusion to suggest acute or subacute ischemia or changes related cute seizure. Gray-white matter differentiation maintained. No encephalomalacia to suggest chronic cortical infarction or other insult. No visible cortical dysplasia or other migrational abnormality. No acute or chronic intracranial blood products. No mass lesion, midline shift or mass effect. No hydrocephalus or extra-axial fluid collection. Pituitary gland suprasellar region normal. Midline structures intact and normally formed. No intrinsic temporal lobe abnormality. No abnormal enhancement. Vascular: Major intracranial vascular flow voids are maintained. Skull and upper cervical spine: Cranial junction normal. Bone marrow signal intensity within normal limits. No scalp soft tissue abnormality. Sinuses/Orbits: Globes orbital soft tissues within normal limits. Scattered mucosal thickening noted about the ethmoidal air cells and maxillary sinuses. No significant mastoid effusion. Other: None. IMPRESSION: 1. Mildly advanced cerebral atrophy for age. 2. Otherwise normal brain MRI. No acute intracranial abnormality identified. Electronically Signed   By: Janell Quiet.D.  On: 04/25/2022 01:18   US Abdomen Limited RUQ  (LIVER/GB)  Result Date: 04/24/2022 CLINICAL DATA:  Pain.  Nausea vomiting. EXAM: ULTRASOUND ABDOMEN LIMITED RIGHT UPPER QUADRANT COMPARISON:  None Available. FINDINGS: Gallbladder: No gallstones or wall thickening visualized. No sonographic Murphy sign noted by sonographer. Common bile duct: Diameter: 4.2 mm Liver: Diffuse increased echogenicity. No focal mass. Portal vein is patent on color Doppler imaging with normal direction of blood flow towards the liver. Other: None. IMPRESSION: 1. No cause for pain identified. 2. Diffuse increased echogenicity in the liver is nonspecific but often due to hepatic steatosis. Electronically Signed   By: Gerome Sam III M.D.   On: 04/24/2022 11:55    Microbiology: Results for orders placed or performed during the hospital encounter of 03/18/22  Resp Panel by RT-PCR (Flu A&B, Covid) Nasopharyngeal Swab     Status: None   Collection Time: 03/18/22  9:04 PM   Specimen: Nasopharyngeal Swab; Nasopharyngeal(NP) swabs in vial transport medium  Result Value Ref Range Status   SARS Coronavirus 2 by RT PCR NEGATIVE NEGATIVE Final    Comment: (NOTE) SARS-CoV-2 target nucleic acids are NOT DETECTED.  The SARS-CoV-2 RNA is generally detectable in upper respiratory specimens during the acute phase of infection. The lowest concentration of SARS-CoV-2 viral copies this assay can detect is 138 copies/mL. A negative result does not preclude SARS-Cov-2 infection and should not be used as the sole basis for treatment or other patient management decisions. A negative result may occur with  improper specimen collection/handling, submission of specimen other than nasopharyngeal swab, presence of viral mutation(s) within the areas targeted by this assay, and inadequate number of viral copies(<138 copies/mL). A negative result must be combined with clinical observations, patient history, and epidemiological information. The expected result is Negative.  Fact Sheet for  Patients:  BloggerCourse.com  Fact Sheet for Healthcare Providers:  SeriousBroker.it  This test is no t yet approved or cleared by the [place] FDA and  has been authorized for detection and/or diagnosis of SARS-CoV-2 by FDA under an Emergency Use Authorization (EUA). This EUA will remain  in effect (meaning this test can be used) for the duration of the COVID-19 declaration under Section 564(b)(1) of the Act, 21 U.S.C.section 360bbb-3(b)(1), unless the authorization is terminated  or revoked sooner.       Influenza A by PCR NEGATIVE NEGATIVE Final   Influenza B by PCR NEGATIVE NEGATIVE Final    Comment: (NOTE) The Xpert Xpress SARS-CoV-2/FLU/RSV plus assay is intended as an aid in the diagnosis of influenza from Nasopharyngeal swab specimens and should not be used as a sole basis for treatment. Nasal washings and aspirates are unacceptable for Xpert Xpress SARS-CoV-2/FLU/RSV testing.  Fact Sheet for Patients: BloggerCourse.com  Fact Sheet for Healthcare Providers: SeriousBroker.it  This test is not yet approved or cleared by the [place] FDA and has been authorized for detection and/or diagnosis of SARS-CoV-2 by FDA under an Emergency Use Authorization (EUA). This EUA will remain in effect (meaning this test can be used) for the duration of the COVID-19 declaration under Section 564(b)(1) of the Act, 21 U.S.C. section 360bbb-3(b)(1), unless the authorization is terminated or revoked.  Performed at [facility], [address], [place], [place] [address]     Labs: CBC: Recent Labs  Lab 04/23/22 2354 04/24/22 0432  WBC 6.4 5.2  NEUTROABS 3.0  --   HGB 15.4 14.9  HCT 44.4 43.9  MCV 98.0 98.0  PLT 202 191  Basic Metabolic Panel: Recent Labs  Lab 04/23/22 2354 04/24/22 0432 04/25/22 0006  NA 139 138  --   K 3.6 3.7  --   CL 101 102  --   CO2  28 24  --   GLUCOSE 87 107*  --   BUN 15 18  --   CREATININE 1.15 1.45*  --   CALCIUM 9.5 9.7  --   MG  --   --  2.0   Liver Function Tests: Recent Labs  Lab 04/23/22 2354 04/24/22 0432  AST 147* 132*  ALT 111* 105*  ALKPHOS 68 59  BILITOT 1.0 0.8  PROT 7.4 6.9  ALBUMIN 4.6 4.2   CBG: Recent Labs  Lab 04/24/22 0313 04/25/22 0722  GLUCAP 134* 78    Discharge time spent: less than 30 minutes.  Signed: Thomas Hoff, MD [facility] 04/25/2022

## 2022-05-24 ENCOUNTER — Observation Stay (HOSPITAL_COMMUNITY)
Admission: EM | Admit: 2022-05-24 | Discharge: 2022-05-25 | Disposition: A | Discharge status: Home / Self Care | Payer: Self-pay | Attending: Emergency Medicine | Admitting: Emergency Medicine

## 2022-05-24 ENCOUNTER — Emergency Department (HOSPITAL_COMMUNITY): Payer: Self-pay

## 2022-05-24 ENCOUNTER — Encounter (HOSPITAL_COMMUNITY): Payer: Self-pay | Admitting: Emergency Medicine

## 2022-05-24 ENCOUNTER — Other Ambulatory Visit: Payer: Self-pay

## 2022-05-24 DIAGNOSIS — X58XXXA Exposure to other specified factors, initial encounter: Secondary | ICD-10-CM | POA: Insufficient documentation

## 2022-05-24 DIAGNOSIS — R569 Unspecified convulsions: Principal | ICD-10-CM | POA: Insufficient documentation

## 2022-05-24 DIAGNOSIS — S82899A Other fracture of unspecified lower leg, initial encounter for closed fracture: Secondary | ICD-10-CM | POA: Diagnosis present

## 2022-05-24 DIAGNOSIS — Z79899 Other long term (current) drug therapy: Secondary | ICD-10-CM | POA: Insufficient documentation

## 2022-05-24 DIAGNOSIS — F1010 Alcohol abuse, uncomplicated: Secondary | ICD-10-CM | POA: Diagnosis present

## 2022-05-24 DIAGNOSIS — F10139 Alcohol abuse with withdrawal, unspecified: Secondary | ICD-10-CM | POA: Insufficient documentation

## 2022-05-24 DIAGNOSIS — S92001A Unspecified fracture of right calcaneus, initial encounter for closed fracture: Secondary | ICD-10-CM | POA: Insufficient documentation

## 2022-05-24 DIAGNOSIS — I10 Essential (primary) hypertension: Secondary | ICD-10-CM | POA: Insufficient documentation

## 2022-05-24 DIAGNOSIS — Z87891 Personal history of nicotine dependence: Secondary | ICD-10-CM | POA: Insufficient documentation

## 2022-05-24 DIAGNOSIS — Y908 Blood alcohol level of 240 mg/100 ml or more: Secondary | ICD-10-CM | POA: Insufficient documentation

## 2022-05-24 HISTORY — DX: Unspecified convulsions: R56.9

## 2022-05-24 LAB — CBC WITH DIFFERENTIAL/PLATELET
Abs Immature Granulocytes: 0.01 10*3/uL (ref 0.00–0.07)
Basophils Absolute: 0 10*3/uL (ref 0.0–0.1)
Basophils Relative: 1 %
Eosinophils Absolute: 0.1 10*3/uL (ref 0.0–0.5)
Eosinophils Relative: 2 %
HCT: 45.8 % (ref 39.0–52.0)
Hemoglobin: 15.8 g/dL (ref 13.0–17.0)
Immature Granulocytes: 0 %
Lymphocytes Relative: 42 %
Lymphs Abs: 2.7 10*3/uL (ref 0.7–4.0)
MCH: 33.3 pg (ref 26.0–34.0)
MCHC: 34.5 g/dL (ref 30.0–36.0)
MCV: 96.6 fL (ref 80.0–100.0)
Monocytes Absolute: 0.4 10*3/uL (ref 0.1–1.0)
Monocytes Relative: 6 %
Neutro Abs: 3.1 10*3/uL (ref 1.7–7.7)
Neutrophils Relative %: 49 %
Platelets: 247 10*3/uL (ref 150–400)
RBC: 4.74 MIL/uL (ref 4.22–5.81)
RDW: 11.3 % — ABNORMAL LOW (ref 11.5–15.5)
WBC: 6.3 10*3/uL (ref 4.0–10.5)
nRBC: 0 % (ref 0.0–0.2)

## 2022-05-24 LAB — COMPREHENSIVE METABOLIC PANEL
ALT: 45 U/L — ABNORMAL HIGH (ref 0–44)
AST: 42 U/L — ABNORMAL HIGH (ref 15–41)
Albumin: 4.7 g/dL (ref 3.5–5.0)
Alkaline Phosphatase: 62 U/L (ref 38–126)
Anion gap: 15 (ref 5–15)
BUN: 16 mg/dL (ref 6–20)
CO2: 21 mmol/L — ABNORMAL LOW (ref 22–32)
Calcium: 9.7 mg/dL (ref 8.9–10.3)
Chloride: 104 mmol/L (ref 98–111)
Creatinine, Ser: 0.93 mg/dL (ref 0.61–1.24)
GFR, Estimated: >60 mL/min (ref >60)
Glucose, Bld: 104 mg/dL — ABNORMAL HIGH (ref 70–99)
Potassium: 4.1 mmol/L (ref 3.5–5.1)
Sodium: 140 mmol/L (ref 135–145)
Total Bilirubin: 0.3 mg/dL (ref 0.3–1.2)
Total Protein: 7 g/dL (ref 6.5–8.1)

## 2022-05-24 LAB — ETHANOL: Alcohol, Ethyl (B): 333 mg/dL (ref <10)

## 2022-05-24 MED ORDER — THIAMINE HCL 100 MG/ML IJ SOLN
100.0000 mg | Freq: Every day | INTRAMUSCULAR | Status: DC
  Administered 2022-05-25: 100 mg via INTRAVENOUS
  Filled 2022-05-24: qty 2

## 2022-05-24 MED ORDER — LORAZEPAM 2 MG/ML IJ SOLN
0.0000 mg | Freq: Four times a day (QID) | INTRAMUSCULAR | Status: DC
  Administered 2022-05-25: 2 mg via INTRAVENOUS
  Filled 2022-05-24: qty 1

## 2022-05-24 MED ORDER — ONDANSETRON 4 MG PO TBDP
4.0000 mg | ORAL_TABLET | Freq: Once | ORAL | Status: AC
  Administered 2022-05-24: 4 mg via ORAL
  Filled 2022-05-24: qty 1

## 2022-05-24 MED ORDER — LORAZEPAM 1 MG PO TABS: 0.0000 mg | ORAL_TABLET | Freq: Four times a day (QID) | ORAL | Status: DC

## 2022-05-24 MED ORDER — LORAZEPAM 1 MG PO TABS: 0.0000 mg | ORAL_TABLET | Freq: Two times a day (BID) | ORAL | Status: DC

## 2022-05-24 MED ORDER — THIAMINE HCL 100 MG PO TABS: 100.0000 mg | ORAL_TABLET | Freq: Every day | ORAL | Status: DC

## 2022-05-24 MED ORDER — LORAZEPAM 2 MG/ML IJ SOLN
INTRAMUSCULAR | Status: AC
  Administered 2022-05-24: 2 mg via INTRAVENOUS
  Filled 2022-05-24: qty 1

## 2022-05-24 MED ORDER — LORAZEPAM 2 MG/ML IJ SOLN: 0.0000 mg | Freq: Two times a day (BID) | INTRAMUSCULAR | Status: DC

## 2022-05-24 MED ORDER — LEVETIRACETAM IN NACL 1000 MG/100ML IV SOLN
1000.0000 mg | Freq: Once | INTRAVENOUS | Status: AC
  Administered 2022-05-24: 1000 mg via INTRAVENOUS
  Filled 2022-05-24: qty 100

## 2022-05-24 NOTE — ED Notes (Signed)
Patient returned for CT ?

## 2022-05-24 NOTE — ED Provider Notes (Signed)
[facility] EMERGENCY DEPARTMENT Provider Note   CSN: [id number] Arrival date & time: 05/24/22  2206     History  Chief Complaint  Patient presents with   Seizures    Adam Peoples Laura Montez Hageman. is a 36 y.o. male.  Adam Peoples [place] Monte[place] Hageman. is a 36 y.o. male with a history of hypertension, alcohol abuse and seizures, who presents to the emergency department via EMS after he was found on hospital property [place] the top [place] from the ambulance bay on the ground in a postictal state.  Patient has been trying to call 911 feeling like he was having a seizure.  Patient reports that at home tonight he had a seizure witnessed by his wife and then his wife dropped him off here at the ED.  Upon rolling into the department with EMS patient had a another seizure described as generalized tonic-clonic movements that lasted a few seconds and was self resolved.  On my assessment patient had a fourth seizure, the second witnessed here in the hospital that again consisted of generalized tonic-clonic movements and lasted about 10-15 seconds and was self resolved.  Afterwards patient had brief postictal period.  Patient complaining of pain to the back of his head and he reports that he thinks he fell and hit his head during one of the seizures.  Reports he has had seizures previously due to alcohol withdrawal.  Does report drinking prior to arrival and reports he tripped weekly drinks about 1/5 of alcohol a day but has been trying to decrease his alcohol intake.  Patient noted to have 11 airplane bottles of [employer] in his back, he reports he was getting ready to go on a trip and typically brings those with him.  He is unsure if he has been taking his Keppra.  Denies any pain elsewhere.  No fevers.  No chest or abdominal pain.  Does report nausea and had one episode of emesis in the room.  The history is provided by the patient, medical records and the EMS personnel.  Seizures      Home  Medications Prior to Admission medications   Medication Sig Start Date End Date Taking? Authorizing Provider  carvedilol (COREG) 3.125 MG tablet Take 1 tablet (3.125 mg total) by mouth 2 (two) times daily. 03/20/22 03/20/23  Leroy Sea, MD  folic acid (FOLVITE) 1 MG tablet Take 1 tablet (1 mg total) by mouth daily. 03/20/22   Leroy Sea, MD  levETIRAcetam (KEPPRA) 500 MG tablet Take 1 tablet (500 mg total) by mouth 2 (two) times daily. 04/25/22 05/25/22  Thomas Hoff, MD  thiamine 100 MG tablet Take 1 tablet (100 mg total) by mouth daily. 03/21/22   Leroy Sea, MD  traZODone (DESYREL) 100 MG tablet Take 1 tablet (100 mg total) by mouth at bedtime. 03/20/22   Leroy Sea, MD      Allergies    Patient has no known allergies.    Review of Systems   Review of Systems  Neurological:  Positive for seizures.    Physical Exam Updated Vital Signs BP 93/61   Pulse 85   Temp 98.3 F (36.8 C) (Oral)   Resp 17   Ht 5\' 8"  (1.727 m)   Wt 74.8 kg   SpO2 93%   BMI 25.09 kg/m  Physical Exam Vitals and nursing note reviewed.  Constitutional:      General: He is not in acute distress.    Appearance: Normal  appearance. He is well-developed. He is not diaphoretic.     Comments: Patient is alert, smells of alcohol and appears intoxicated but is able to answer questions  HENT:     Head: Normocephalic and atraumatic.     Mouth/Throat:     Mouth: Mucous membranes are moist.     Pharynx: Oropharynx is clear.  Eyes:     General:        Right eye: No discharge.        Left eye: No discharge.     Extraocular Movements: Extraocular movements intact.     Pupils: Pupils are equal, round, and reactive to light.  Neck:     Comments: C-collar in place Cardiovascular:     Rate and Rhythm: Regular rhythm. Tachycardia present.     Pulses: Normal pulses.     Heart sounds: Normal heart sounds.     Comments: Tachycardia with regular rhythm Pulmonary:     Effort: Pulmonary effort is  normal. No respiratory distress.     Breath sounds: Normal breath sounds. No wheezing or rales.     Comments: Respirations equal and unlabored, patient able to speak in full sentences, lungs clear to auscultation bilaterally  Abdominal:     General: Bowel sounds are normal. There is no distension.     Palpations: Abdomen is soft. There is no mass.     Tenderness: There is no abdominal tenderness. There is no guarding.     Comments: Abdomen soft, nondistended, nontender to palpation in all quadrants without guarding or peritoneal signs  Musculoskeletal:        General: Tenderness present. No deformity.     Cervical back: Neck supple.     Comments: Tenderness over the right foot with some ecchymosis, DP pulse 2+, normal sensation.  No bony tenderness at the ankle.  Skin:    General: Skin is warm and dry.     Capillary Refill: Capillary refill takes less than 2 seconds.  Neurological:     Mental Status: He is alert and oriented to person, place, and time.     Coordination: Coordination normal.     Comments: Speech is clear, able to follow commands CN III-XII intact Normal strength in upper and lower extremities bilaterally including dorsiflexion and plantar flexion, strong and equal grip strength Sensation normal to light and sharp touch Moves extremities without ataxia, coordination intact  Psychiatric:        Mood and Affect: Mood normal.        Behavior: Behavior normal.    ED Results / Procedures / Treatments   Labs (all labs ordered are listed, but only abnormal results are displayed) Labs Reviewed  COMPREHENSIVE METABOLIC PANEL - Abnormal; Notable for the following components:      Result Value   CO2 21 (*)    Glucose, Bld 104 (*)    AST 42 (*)    ALT 45 (*)    All other components within normal limits  CBC WITH DIFFERENTIAL/PLATELET - Abnormal; Notable for the following components:   RDW 11.3 (*)    All other components within normal limits  ETHANOL - Abnormal; Notable  for the following components:   Alcohol, Ethyl (B) 333 (*)    All other components within normal limits  RAPID URINE DRUG SCREEN, HOSP PERFORMED  CBC WITH DIFFERENTIAL/PLATELET  COMPREHENSIVE METABOLIC PANEL  MAGNESIUM    EKG None  Radiology CT Head Wo Contrast  Result Date: 05/24/2022 CLINICAL DATA:  Head and neck trauma, abnormal mental  status. EXAM: CT HEAD WITHOUT CONTRAST CT CERVICAL SPINE WITHOUT CONTRAST TECHNIQUE: Multidetector CT imaging of the head and cervical spine was performed following the standard protocol without intravenous contrast. Multiplanar CT image reconstructions of the cervical spine were also generated. RADIATION DOSE REDUCTION: This exam was performed according to the departmental dose-optimization program which includes automated exposure control, adjustment of the mA and/or kV according to patient size and/or use of iterative reconstruction technique. COMPARISON:  CT head dated April 24, 2022 FINDINGS: CT HEAD FINDINGS Brain: No evidence of acute infarction, hemorrhage, hydrocephalus, extra-axial collection or mass lesion/mass effect. Mild cerebral volume loss. Vascular: No hyperdense vessel or unexpected calcification. Skull: Normal. Negative for fracture or focal lesion. Sinuses/Orbits: No acute finding. Other: None. CT CERVICAL SPINE FINDINGS Alignment: Straightening of the cervical spine, which could be positional. Skull base and vertebrae: No acute fracture. No primary bone lesion or focal pathologic process. Soft tissues and spinal canal: No prevertebral fluid or swelling. No visible canal hematoma. Disc levels: No significant disc bulge, spinal canal or neural foraminal stenosis. Upper chest: Negative. Other: None IMPRESSION: 1.  No acute intracranial abnormality. 2.  Mild cerebral atrophy, which is advanced for age. 3. No acute cervical spine fracture or traumatic subluxation. No significant paraspinal soft tissue abnormality. Electronically Signed   By: Larose Hires  D.O.   On: 05/24/2022 23:08   CT Cervical Spine Wo Contrast  Result Date: 05/24/2022 CLINICAL DATA:  Head and neck trauma, abnormal mental status. EXAM: CT HEAD WITHOUT CONTRAST CT CERVICAL SPINE WITHOUT CONTRAST TECHNIQUE: Multidetector CT imaging of the head and cervical spine was performed following the standard protocol without intravenous contrast. Multiplanar CT image reconstructions of the cervical spine were also generated. RADIATION DOSE REDUCTION: This exam was performed according to the departmental dose-optimization program which includes automated exposure control, adjustment of the mA and/or kV according to patient size and/or use of iterative reconstruction technique. COMPARISON:  CT head dated April 24, 2022 FINDINGS: CT HEAD FINDINGS Brain: No evidence of acute infarction, hemorrhage, hydrocephalus, extra-axial collection or mass lesion/mass effect. Mild cerebral volume loss. Vascular: No hyperdense vessel or unexpected calcification. Skull: Normal. Negative for fracture or focal lesion. Sinuses/Orbits: No acute finding. Other: None. CT CERVICAL SPINE FINDINGS Alignment: Straightening of the cervical spine, which could be positional. Skull base and vertebrae: No acute fracture. No primary bone lesion or focal pathologic process. Soft tissues and spinal canal: No prevertebral fluid or swelling. No visible canal hematoma. Disc levels: No significant disc bulge, spinal canal or neural foraminal stenosis. Upper chest: Negative. Other: None IMPRESSION: 1.  No acute intracranial abnormality. 2.  Mild cerebral atrophy, which is advanced for age. 3. No acute cervical spine fracture or traumatic subluxation. No significant paraspinal soft tissue abnormality. Electronically Signed   By: Larose Hires D.O.   On: 05/24/2022 23:08   DG Chest 1 View  Result Date: 05/24/2022 CLINICAL DATA:  Recent seizure activity and fall with chest pain, initial encounter EXAM: PORTABLE CHEST 1 VIEW COMPARISON:  12/26/2021  FINDINGS: The heart size and mediastinal contours are within normal limits. Both lungs are clear. The visualized skeletal structures are unremarkable. IMPRESSION: No active disease. Electronically Signed   By: Alcide Clever M.D.   On: 05/24/2022 22:50   DG Foot Complete Right  Result Date: 05/24/2022 CLINICAL DATA:  Recent foot injury, initial encounter EXAM: RIGHT FOOT COMPLETE - 3+ VIEW COMPARISON:  None Available. FINDINGS: There is an avulsion fracture from the anterolateral aspect of the calcaneus. Mild soft  tissue swelling is noted laterally. No other fracture is seen. IMPRESSION: Avulsion from the anterolateral calcaneus with mild soft tissue swelling. Electronically Signed   By: Alcide CleverMark  Lukens M.D.   On: 05/24/2022 22:49    Procedures .Critical Care  Performed by: Dartha LodgeFord, Wally Shevchenko N, PA-C Authorized by: Dartha LodgeFord, Takuma Cifelli N, PA-C   Critical care provider statement:    Critical care time (minutes):  30   Critical care time was exclusive of:  Separately billable procedures and treating other patients   Critical care was necessary to treat or prevent imminent or life-threatening deterioration of the following conditions:  CNS failure or compromise (multiple seizures)   Critical care was time spent personally by me on the following activities:  Development of treatment plan with patient or surrogate, discussions with consultants, evaluation of patient's response to treatment, examination of patient, ordering and review of laboratory studies, ordering and review of radiographic studies, ordering and performing treatments and interventions, pulse oximetry, re-evaluation of patient's condition and review of old charts   Care discussed with: admitting provider       Medications Ordered in ED Medications  LORazepam (ATIVAN) injection 0-4 mg (2 mg Intravenous Given 05/24/22 2212)    Or  LORazepam (ATIVAN) tablet 0-4 mg ( Oral See Alternative 05/24/22 2212)  LORazepam (ATIVAN) injection 0-4 mg (has no  administration in time range)    Or  LORazepam (ATIVAN) tablet 0-4 mg (has no administration in time range)  thiamine tablet 100 mg (has no administration in time range)    Or  thiamine (B-1) injection 100 mg (has no administration in time range)  acetaminophen (TYLENOL) tablet 650 mg (has no administration in time range)    Or  acetaminophen (TYLENOL) suppository 650 mg (has no administration in time range)  sodium chloride 0.9 % 1,000 mL with thiamine 100 mg, folic acid 1 mg, M.V.I. Adult 10 mL infusion (has no administration in time range)  folic acid (FOLVITE) tablet 1 mg (has no administration in time range)  multivitamin (PROSIGHT) tablet 1 tablet (has no administration in time range)  levETIRAcetam (KEPPRA) IVPB 1000 mg/100 mL premix (0 mg Intravenous Stopped 05/24/22 2242)  ondansetron (ZOFRAN-ODT) disintegrating tablet 4 mg (4 mg Oral Given 05/24/22 2221)    ED Course/ Medical Decision Making/ A&P                           Medical Decision Making Amount and/or Complexity of Data Reviewed Labs: ordered. Radiology: ordered.  Risk OTC drugs. Prescription drug management. Decision regarding hospitalization.   Adam PeoplesStephen Dean Kitchens Montez HagemanJr. is a 36 y.o. male presents to the ED for concern of seizures, this involves an extensive number of treatment options, and is a complaint that carries with it a high risk of complications and morbidity.  The differential diagnosis includes alcohol withdrawal seizure, medication noncompliance, electrolyte derangement, hypoglycemia, substance abuse   Additional history obtained:  Additional history obtained from EMS personnel External records from outside source obtained and reviewed including imaging and diagnostics from recent admission for seizures at [facility] in June   Lab Tests:  I Ordered, reviewed, and interpreted labs.  The pertinent results include: No leukocytosis, normal hemoglobin, no significant electrolyte derangements,  normal renal function, very mild transaminitis, likely in the setting of alcohol use.  Despite patient's reports of trying to decrease his alcohol usage he has an ethanol level of 333, making alcohol withdrawal seizures less likely.   Imaging Studies ordered:  I  ordered imaging studies including CT of the head and cervical spine, chest x-ray, right foot x-ray I independently visualized and interpreted imaging which showed CT of the head without acute intracranial abnormalities, CT C-spine unremarkable, c-collar removed.  Chest x-ray without active cardiopulmonary disease.  X-ray of the right foot with a very small avulsion fracture off of the anterior lateral calcaneus with some mild soft tissue swelling. I agree with the radiologist interpretation   Cardiac Monitoring:  The patient was maintained on a cardiac monitor.  I personally viewed and interpreted the cardiac monitored which showed an underlying rhythm of: Sinus tachycardia on arrival, after Ativan tachycardia resolved   Medicines ordered and prescription drug management:  I ordered medication including Ativan for seizures  Reevaluation of the patient after these medicines showed that the patient improved I have reviewed the patients home medicines and have made adjustments as needed   ED Course:  Patient has reportedly had 2 seizures prior to arrival and had 2 brief tonic-clonic seizures upon arrival to the emergency department.  Patient given 2 mg of Ativan and loading dose of Keppra and has had no further seizure-like activity, did have postictal period.  Initially there was concern for alcohol withdrawal, but patient has an ethanol level of 333.  More likely continued seizures in the setting of noncompliance with Keppra.  No other significant electrolyte derangements.  UDS is pending. Fortunately CT of the head and C-spine is unremarkable, patient does have a slight avulsion fracture on the right foot where he reports an injury 2  days ago. Neurology consulted, and patient will require admission given multiple seizures.  Started on CIWA protocol.   Consultations Obtained:  I requested consultation with the neurologist,  and discussed lab and imaging findings as well as pertinent plan -Dr. Amada Jupiter feels that given elevated ethanol level this is unlikely alcohol withdrawal although this certainly exacerbate things, recommends admission, continued Keppra and EEG, will see patient in consult I requested consultation with the hospitalist and discussed pertinent labs, imaging and plan.  Dr. Arlean Hopping will see and admit the patient.    Dispostion:  After consideration of the diagnostic results and the patients response to treatment feel that the patent would benefit from admission.          Final Clinical Impression(s) / ED Diagnoses Final diagnoses:  Seizures [facility])    Rx / DC Orders ED Discharge Orders     None         Dartha Lodge, [place] 05/25/22 0250    Shon Baton, MD 05/25/22 [phone]

## 2022-05-24 NOTE — ED Provider Notes (Incomplete)
  [facility] EMERGENCY DEPARTMENT Provider Note   CSN: [id number] Arrival date & time: 05/24/22  2206     History {Add pertinent medical, surgical, social history, OB history to HPI:1} Chief Complaint  Patient presents with  . Seizures    Adam Peoples Marsan Montez Hageman. is a 36 y.o. male.   Seizures      Home Medications Prior to Admission medications   Medication Sig Start Date End Date Taking? Authorizing Provider  carvedilol (COREG) 3.125 MG tablet Take 1 tablet (3.125 mg total) by mouth 2 (two) times daily. 03/20/22 03/20/23  Leroy Sea, MD  folic acid (FOLVITE) 1 MG tablet Take 1 tablet (1 mg total) by mouth daily. 03/20/22   Leroy Sea, MD  levETIRAcetam (KEPPRA) 500 MG tablet Take 1 tablet (500 mg total) by mouth 2 (two) times daily. 04/25/22 05/25/22  Thomas Hoff, MD  thiamine 100 MG tablet Take 1 tablet (100 mg total) by mouth daily. 03/21/22   Leroy Sea, MD  traZODone (DESYREL) 100 MG tablet Take 1 tablet (100 mg total) by mouth at bedtime. 03/20/22   Leroy Sea, MD      Allergies    Patient has no known allergies.    Review of Systems   Review of Systems  Neurological:  Positive for seizures.    Physical Exam Updated Vital Signs There were no vitals taken for this visit. Physical Exam  ED Results / Procedures / Treatments   Labs (all labs ordered are listed, but only abnormal results are displayed) Labs Reviewed - No data to display  EKG None  Radiology No results found.  Procedures Procedures  {Document cardiac monitor, telemetry assessment procedure when appropriate:1}  Medications Ordered in ED Medications  LORazepam (ATIVAN) 2 MG/ML injection (has no administration in time range)    ED Course/ Medical Decision Making/ A&P                           Medical Decision Making  ***  {Document critical care time when appropriate:1} {Document review of labs and clinical decision tools ie heart score,  Chads2Vasc2 etc:1}  {Document your independent review of radiology images, and any outside records:1} {Document your discussion with family members, caretakers, and with consultants:1} {Document social determinants of health affecting pt's care:1} {Document your decision making why or why not admission, treatments were needed:1} Final Clinical Impression(s) / ED Diagnoses Final diagnoses:  None    Rx / DC Orders ED Discharge Orders     None

## 2022-05-24 NOTE — ED Triage Notes (Signed)
Pt called 911 from outside ER, pt was found by EMS laying on the cement side walk, pt had vomit next to him. Pt reports he had a sz at home and his wife dropped him off at ER, pt was trying to find entrance and felt like he was going to have another  seizure, pt called 911. Then pt woke up on the ground. C-collar was applied. Pt reports alcohol withdrawal sz hx, pt has 11 air[plane bottles of jim bean in his bag. Pt had witnessed sz in hallway in ED and another upon getting into room.

## 2022-05-25 ENCOUNTER — Encounter (HOSPITAL_COMMUNITY): Payer: Self-pay | Admitting: Internal Medicine

## 2022-05-25 DIAGNOSIS — S82899A Other fracture of unspecified lower leg, initial encounter for closed fracture: Secondary | ICD-10-CM | POA: Diagnosis present

## 2022-05-25 DIAGNOSIS — I10 Essential (primary) hypertension: Secondary | ICD-10-CM

## 2022-05-25 DIAGNOSIS — F1010 Alcohol abuse, uncomplicated: Secondary | ICD-10-CM

## 2022-05-25 DIAGNOSIS — S82891A Other fracture of right lower leg, initial encounter for closed fracture: Secondary | ICD-10-CM

## 2022-05-25 DIAGNOSIS — R569 Unspecified convulsions: Principal | ICD-10-CM

## 2022-05-25 LAB — CBC WITH DIFFERENTIAL/PLATELET
Abs Immature Granulocytes: 0.01 10*3/uL (ref 0.00–0.07)
Basophils Absolute: 0 10*3/uL (ref 0.0–0.1)
Basophils Relative: 1 %
Eosinophils Absolute: 0 10*3/uL (ref 0.0–0.5)
Eosinophils Relative: 0 %
HCT: 43.5 % (ref 39.0–52.0)
Hemoglobin: 15.1 g/dL (ref 13.0–17.0)
Immature Granulocytes: 0 %
Lymphocytes Relative: 37 %
Lymphs Abs: 1.6 10*3/uL (ref 0.7–4.0)
MCH: 33.8 pg (ref 26.0–34.0)
MCHC: 34.7 g/dL (ref 30.0–36.0)
MCV: 97.3 fL (ref 80.0–100.0)
Monocytes Absolute: 0.3 10*3/uL (ref 0.1–1.0)
Monocytes Relative: 6 %
Neutro Abs: 2.4 10*3/uL (ref 1.7–7.7)
Neutrophils Relative %: 56 %
Platelets: 203 10*3/uL (ref 150–400)
RBC: 4.47 MIL/uL (ref 4.22–5.81)
RDW: 11.3 % — ABNORMAL LOW (ref 11.5–15.5)
WBC: 4.4 10*3/uL (ref 4.0–10.5)
nRBC: 0 % (ref 0.0–0.2)

## 2022-05-25 LAB — COMPREHENSIVE METABOLIC PANEL
ALT: 45 U/L — ABNORMAL HIGH (ref 0–44)
AST: 40 U/L (ref 15–41)
Albumin: 4.4 g/dL (ref 3.5–5.0)
Alkaline Phosphatase: 54 U/L (ref 38–126)
Anion gap: 17 — ABNORMAL HIGH (ref 5–15)
BUN: 16 mg/dL (ref 6–20)
CO2: 23 mmol/L (ref 22–32)
Calcium: 9.5 mg/dL (ref 8.9–10.3)
Chloride: 103 mmol/L (ref 98–111)
Creatinine, Ser: 1.02 mg/dL (ref 0.61–1.24)
GFR, Estimated: >60 mL/min (ref >60)
Glucose, Bld: 78 mg/dL (ref 70–99)
Potassium: 4.1 mmol/L (ref 3.5–5.1)
Sodium: 143 mmol/L (ref 135–145)
Total Bilirubin: 0.7 mg/dL (ref 0.3–1.2)
Total Protein: 6.7 g/dL (ref 6.5–8.1)

## 2022-05-25 LAB — MAGNESIUM: Magnesium: 1.8 mg/dL (ref 1.7–2.4)

## 2022-05-25 LAB — PHOSPHORUS: Phosphorus: 3.5 mg/dL (ref 2.5–4.6)

## 2022-05-25 MED ORDER — THIAMINE HCL 100 MG PO TABS: 100.0000 mg | ORAL_TABLET | Freq: Every day | ORAL | Status: DC

## 2022-05-25 MED ORDER — ADULT MULTIVITAMIN W/MINERALS CH: 1.0000 | ORAL_TABLET | Freq: Every day | ORAL | Status: DC

## 2022-05-25 MED ORDER — LORAZEPAM 2 MG/ML IJ SOLN: 0.0000 mg | Freq: Two times a day (BID) | INTRAMUSCULAR | Status: DC

## 2022-05-25 MED ORDER — LORAZEPAM 2 MG/ML IJ SOLN
0.0000 mg | Freq: Four times a day (QID) | INTRAMUSCULAR | Status: DC
  Administered 2022-05-25 (×2): 2 mg via INTRAVENOUS
  Filled 2022-05-25: qty 1

## 2022-05-25 MED ORDER — THIAMINE HCL 100 MG PO TABS: 100.0000 mg | ORAL_TABLET | Freq: Every day | ORAL | 0 refills | Status: DC

## 2022-05-25 MED ORDER — LORAZEPAM 2 MG/ML IJ SOLN
1.0000 mg | INTRAMUSCULAR | Status: DC | PRN
  Filled 2022-05-25: qty 1

## 2022-05-25 MED ORDER — LORAZEPAM 1 MG PO TABS: 1.0000 mg | ORAL_TABLET | ORAL | Status: DC | PRN

## 2022-05-25 MED ORDER — THIAMINE HCL 100 MG/ML IJ SOLN
Freq: Once | INTRAVENOUS | Status: AC
  Filled 2022-05-25: qty 1000

## 2022-05-25 MED ORDER — SODIUM CHLORIDE 0.9 % IV BOLUS
1000.0000 mL | Freq: Once | INTRAVENOUS | Status: AC
  Administered 2022-05-25: 1000 mL via INTRAVENOUS

## 2022-05-25 MED ORDER — LEVETIRACETAM 500 MG PO TABS: 500.0000 mg | ORAL_TABLET | Freq: Two times a day (BID) | ORAL | 0 refills | Status: DC

## 2022-05-25 MED ORDER — THIAMINE HCL 100 MG/ML IJ SOLN: 100.0000 mg | Freq: Every day | INTRAMUSCULAR | Status: DC

## 2022-05-25 MED ORDER — FOLIC ACID 1 MG PO TABS: 1.0000 mg | ORAL_TABLET | Freq: Every day | ORAL | 0 refills | Status: DC

## 2022-05-25 MED ORDER — ACETAMINOPHEN 650 MG RE SUPP: 650.0000 mg | Freq: Four times a day (QID) | RECTAL | Status: DC | PRN

## 2022-05-25 MED ORDER — LEVETIRACETAM IN NACL 500 MG/100ML IV SOLN
500.0000 mg | Freq: Two times a day (BID) | INTRAVENOUS | Status: DC
  Administered 2022-05-25: 500 mg via INTRAVENOUS
  Filled 2022-05-25 (×2): qty 100

## 2022-05-25 MED ORDER — FOLIC ACID 1 MG PO TABS: 1.0000 mg | ORAL_TABLET | Freq: Every day | ORAL | Status: DC

## 2022-05-25 MED ORDER — FOLIC ACID 1 MG PO TABS
1.0000 mg | ORAL_TABLET | Freq: Every day | ORAL | Status: DC
  Administered 2022-05-25: 1 mg via ORAL
  Filled 2022-05-25: qty 1

## 2022-05-25 MED ORDER — ACETAMINOPHEN 325 MG PO TABS: 650.0000 mg | ORAL_TABLET | Freq: Four times a day (QID) | ORAL | Status: DC | PRN

## 2022-05-25 MED ORDER — ADULT MULTIVITAMIN W/MINERALS CH
1.0000 | ORAL_TABLET | Freq: Every day | ORAL | Status: DC
  Administered 2022-05-25: 1 via ORAL
  Filled 2022-05-25: qty 1

## 2022-05-25 MED ORDER — GABAPENTIN 300 MG PO CAPS: 300.0000 mg | ORAL_CAPSULE | Freq: Two times a day (BID) | ORAL | 0 refills | Status: DC

## 2022-05-25 NOTE — Assessment & Plan Note (Signed)
  #)   Generalized tonic-clonic seizures: Report of 4 separate episodes of seizures, with 2 of such witnessed by medical staff in the ED, and were noted to involve generalized tonic-clonic shaking in all 4 extremities associated with diminished responsiveness and lasting for approximate 10 seconds before spontaneous resolution without interval antiepileptic medications and separately associated with postictal state lasting approximately 5 minutes before returning back to baseline mental status.  This is in the context of a documented history of seizures, with the patient reporting suboptimal compliance on his outpatient Keppra, which is felt to be a significant contributing factor leading to his seizures today, as alcohol withdrawal seizures, possible, or felt to be less likely given his acute alcohol intoxication this evening, with initial serum ethanol level 333.  CT head showed no evidence of acute process.  No evidence of underlying infection.  No evidence of significant electrolyte contribution, although his serum magnesium level is pending.  EDP discussed the patient's case with the on-call neurologist, Dr. Amada Jupiter , Who will formally consult and has recommended admission to the hospitalist service for further evaluation management of seizure episodes, with additional recommendations to continue home Keppra, repeat EEG, and to initiate CIWA protocol.  Of note, patient has been loaded with Keppra 1 g IV x1 in the ED.  Is awake alert, and appears to be at baseline mental status at this time.  Plan: Neurology consulted, as above.  Resume home Keppra.  EEG, as above.  CIWA protocol.  Every 4 hours neurochecks ordered.  Repeat CMP and CBC in the morning add on serum magnesium level.  Check urinary drug screen.

## 2022-05-25 NOTE — Assessment & Plan Note (Signed)
 #)   Chronic Alcohol Abuse: With evidence of acute alcohol intoxication. the patient reports typical daily alcohol consumption of 1/5 of hard liquor per day, and that typical daily alcohol consumption of this volume has been occurring for multiple years. Denies any interest in alcohol consumption discontinuation at this time. Most recent alcohol consumption occurred, per patient, and the last 1 to 2 days.  Presenting serum ethanol level 333, rendering immediate likelihood of acute alcohol withdrawal to appear less likely.  Will closely monitor for ensuing evidence of alcohol withdrawal, as further detailed below, will initiating CIWA protocol, per recommendation of ensuing neurology consultation, as above.  Plan: counseled the patient on the importance of reduction in alcohol consumption. Consult to transition of care team placed. Close monitoring of ensuing BP and HR via routine VS. Symptoms-based CIWA protocol with prn Ativan ordered. Seizure precautions. Telemetry. Add-on serum Mg level. Check serum phosphorus level. Repeat CMP in the morning.  UDS. Banana bag x1 has been ordered followed by initiation of oral thiamine, folic acid, and multivitamin supplementation to a current acute daily basis, with first dose doses to occur in the morning of 05/25/2022.

## 2022-05-25 NOTE — Assessment & Plan Note (Signed)
 #)   Essential Hypertension: documented h/o such, with outpatient antihypertensive regimen consisting of Coreg.  SBP's in the ED today: 90s to low 100s mmHg. consequently, will hold home Coreg for now, closely monitoring ensuing blood pressure and heart rate on telemetry to help guide timing of resumption of his home antihypertensive medication.  Plan: Close monitoring of subsequent BP via routine VS. holding home Coreg for now, as above.

## 2022-05-25 NOTE — Consult Note (Signed)
Neurology Consultation Reason for Consult: Seizures Referring Physician: Burnett Corrente  CC: Seizures  History is obtained from: Chart review  HPI: Leevon Upperman Jenkin Montez Hageman. is a 36 y.o. male with history of multiple presentations for seizures which have been attributed to alcohol withdrawal in the past.  He is sleepy and hesitant to give any history, repeatedly asking to be left alone.  According to Dr. Shelbie Hutching note from 1 month ago he has had alcohol withdrawal seizures going back about 10 years.  He has not quit drinking this time, however, but he is trying to wean himself off gradually.  Of note his EtOH level at his last hospitalization was 258 and at this hospitalization is 330.  Typically drinks approximately a fifth of liquor a day.  He reportedly had approximately four seizures, most recent just outside the ED.  Past Medical History:  Diagnosis Date   Eating disorder    history of bulemia   HYPERTENSION 06/25/2010   Hypertension      Family History  Problem Relation Age of Onset   Cancer Mother        lung   Hypertension Father      Social History:  reports that he has quit smoking. He uses smokeless tobacco. He reports current alcohol use of about 70.0 standard drinks of alcohol per week. He reports that he does not currently use drugs.   Exam: Current vital signs: BP 107/65   Pulse 82   Temp 98.3 F (36.8 C) (Oral)   Resp 17   Ht 5\' 8"  (1.727 m)   Wt 74.8 kg   SpO2 92%   BMI 25.09 kg/m  Vital signs in last 24 hours: Temp:  [98.3 F (36.8 C)] 98.3 F (36.8 C) (07/03 2310) Pulse Rate:  [81-110] 82 (07/04 0030) Resp:  [16-26] 17 (07/04 0030) BP: (86-138)/(55-95) 107/65 (07/04 0030) SpO2:  [92 %-98 %] 92 % (07/04 0030) Weight:  [74.8 kg] 74.8 kg (07/03 2210)   Physical Exam  Constitutional: Appears well-developed and well-nourished.  Psych: Affect appropriate to situation Eyes: No scleral injection HENT: No OP obstruction MSK: no joint deformities.   Cardiovascular: Normal rate and regular rhythm.  Respiratory: Effort normal, non-labored breathing GI: Soft.  No distension. There is no tenderness.  Skin: WDI  Neuro: Mental Status: Patient is drowsy but easily arousable, oriented to person, place, month, year, and situation. Patient is able to give a clear and coherent history. No signs of aphasia or neglect Cranial Nerves: II: Visual Fields are full. Pupils are equal, round, and reactive to light.   III,IV, VI: EOMI without ptosis or diploplia.  V: Facial sensation is symmetric to temperature VII: Facial movement is symmetric.  VIII: hearing is intact to voice X: Uvula elevates symmetrically XI: Shoulder shrug is symmetric. XII: tongue is midline without atrophy or fasciculations.  Motor: Tone is normal. Bulk is normal. 5/5 strength was present in all four extremities.  Sensory: Sensation is symmetric to light touch and temperature in the arms and legs. Cerebellar: No clear ataxia     I have reviewed labs in epic and the results pertinent to this consultation are: Sodium 140 Calcium 9.7 Magnesium from 1 month ago was 2.0, current one is pending Ethanol 333   I have reviewed the images obtained: CT head-negative MRI brain from  Impression: 36 year old male with recurrent presentations for seizures.  Though he does report some reduction in alcohol use, the fact that he was drinking tonight, with an alcohol level of  330 would argue against alcohol withdrawal as etiology.  At this point I think we are obligated to continue antiepileptic therapy, and Dr. Otelia Limes felt the same way when he saw him last month.  The patient has not been taking his Keppra as prescribed.  Recommendations: 1) Keppra 500 mg twice daily 2) repeat EEG 3) CIWA protocol   Ritta Slot, MD [facility] [phone]  If 7pm- 7am, please page neurology on call as listed in AMION.

## 2022-05-25 NOTE — ED Notes (Signed)
Able to tolerate PO at this time with [place] sandwich and ginger ale. MD notified.

## 2022-05-25 NOTE — ED Notes (Signed)
Patient alert at this time.  Able to recall events of last night.  No reports of HA, dizziness, nausea, or vomiting.  Voiced understanding concerning plan of care

## 2022-05-25 NOTE — Assessment & Plan Note (Signed)
   #)   Avulsion fracture of the right calcaneus: Noted on plain films of the right foot after recent fall in the preceding days, as further detailed above.  Right laboratory appears to be neurovascular intact.  Will strive for pain control now, with potential for outpatient orthopedic surgery consultation.  Plan: Prn acetaminophen.

## 2022-05-25 NOTE — Progress Notes (Signed)
Patient Refused to have EEG done stated he had one done before

## 2022-05-25 NOTE — H&P (Signed)
History and Physical    PLEASE NOTE THAT DRAGON DICTATION SOFTWARE WAS USED IN THE CONSTRUCTION OF THIS NOTE.   Adam [name] [employer]. DX:[id number] DOB: 1986/03/23 DOA: 05/24/2022  PCP: Patient, No Pcp Per (will further assess) Patient coming from: home   I have personally briefly reviewed patient's old medical records in [place]  Chief Complaint: Seizures  HPI: Adam [name]. is a 36 y.o. male with medical history significant for seizures, chronic alcohol abuse, essential pretension, who is admitted to [facility] on 05/24/2022 with seizures after presenting from home to [facility] ED complaining of such.   The patient reports that he experienced a generalized tonic-clonic seizure at home witnessed by his wife, prompting his wife to drop him off outside of the emergency department for further evaluation and management thereof.  As the patient was attempting to ambulate into the emergency department for associated evaluation, he reportedly experienced an additional seizure, witnessed by a good samaritan, who alerted medical personnel inside.  Specific nature of this reported seizure-like episode is not entirely clear nor was its duration.  However, upon arriving inside the emergency department, the patient subsequently exhibited evidence of 2 additional episodes of seizure-like activity, that was described as generalized tonic-clonic shaking involving all 4 extremities, with both of these episodes lasted for approximately 10 seconds before spontaneous resolution in the absence of any pharmacologic intervention.  Completion of both of these tonic-clonic episodes were associated with similar periods of diminished responsiveness lasting for approximately 5 minutes, before the patient returned to baseline mental status.  These episodes were not associated with any loss of bowel/bladder function or any associated tongue biting.  This is in the context of a history of seizures, in  which the patient acknowledges suboptimal compliance with his outpatient Keppra 500 mg p.o. twice daily.  He specifically notes missing several doses of his antiepileptic over the last week alone.   He also confirms a history of chronic alcohol abuse, noting typical daily consumption of 1/5 of hard liquor per day, conveying most recent such alcohol consumption over the last 1 to 2 days.  Otherwise, denies any recent use of recreational drugs, including no use of, including no cocaine, methamphetamines, amphetamines.  No recent use of SSRIs or SNRIs.  No chronic or recent use of benzodiazepines, opioids.   Not a/w any recent acute focal weakness, acute focal numbness, paresthesias, slurred speech, facial droop, vertigo, nausea, vomiting, or acute change in vision.  No recent reported headache, neck stiffness, rash.   No recent subjective fever, chills, rigors, or generalized myalgias. No recent sore throat, sob, wheezing, cough, abdominal pain, diarrhea.  denies any recent dysuria, gross hematuria, or change in urinary urgency/frequency.   The patient reports that he experienced a ground-level mechanical fall few days ago, with ensuing discomfort involving his right ankle.  He confirms no loss of consciousness associate this fall, and did not hit his head as a component of it.  Not on any blood thinning agents as an outpatient, including no aspirin.    ED Course:  Vital signs in the ED were notable for the following: Afebrile; heart rate 78-1 10; blood pressure 93/61-128/76; respiratory rate 16-20, oxygen saturation 93 to 98%  Labs were notable for the following: CMP notable for following: Sodium 140, creatinine 0.93, glucose 104, AST 42, ALT 45, otherwise liver enzymes within normal limits.  This is relative to most recent prior AST/ALT of 132 and 105, respectively, when checked on 04/24/2022.  Urinary  drug screen has been ordered, with result currently pending.  Serum ethanol level 333.  Imaging and  additional notable ED work-up: EKG, in comparison was recent prior from 6-23 shows sinus rhythm with heart rate 98, normal intervals, T wave inversion in leads III and aVF, unchanged from most recent prior, as well as less than 1 mm ST elevation in V1/V2, also unchanged from most recent prior EKG, will demonstrating no evidence of new T wave or ST changes.  Plain films of the right foot show avulsion fracture from the anterior lateral aspect of the calcaneus with mild soft tissue swelling, but otherwise no evidence of acute process including no evidence of additional fracture.  Chest x-ray shows no evidence of acute cardiopulmonary process.  CT head showed no evidence of acute intracranial process clinically no evidence of discrete hemorrhage nor any evidence of acute infarct.  CT cervical spine showed no evidence of acute cervical spine fracture or subluxation injury.  EDP discussed the patient's case with the on-call neurologist, Dr. Amada Jupiter , Who will formally consult and has recommended admission to the hospitalist service for further evaluation management of seizure episodes, with additional recommendations to continue home Keppra, repeat EEG, and to initiate CIWA protocol.  While in the ED, the following were administered: Ativan 2 mg IV x1, Zofran 4 mg p.o. x1, Keppra 1 g IV x1.  Subsequently, the patient was admitted for overnight observation for further evaluation management of presenting seizures.    Review of Systems: As per HPI otherwise 10 point review of systems negative.   Past Medical History:  Diagnosis Date   Eating disorder    history of bulemia   HYPERTENSION 06/25/2010   Seizure (HCC)     History reviewed. No pertinent surgical history.  Social History:  reports that he has quit smoking. He uses smokeless tobacco. He reports current alcohol use of about 70.0 standard drinks of alcohol per week. He reports that he does not currently use drugs.   No Known  Allergies  Family History  Problem Relation Age of Onset   Cancer Mother        lung   Hypertension Father     Family history reviewed and not pertinent    Prior to Admission medications   Medication Sig Start Date End Date Taking? Authorizing Provider  carvedilol (COREG) 3.125 MG tablet Take 1 tablet (3.125 mg total) by mouth 2 (two) times daily. 03/20/22 03/20/23  Leroy Sea, MD  folic acid (FOLVITE) 1 MG tablet Take 1 tablet (1 mg total) by mouth daily. 03/20/22   Leroy Sea, MD  levETIRAcetam (KEPPRA) 500 MG tablet Take 1 tablet (500 mg total) by mouth 2 (two) times daily. 04/25/22 05/25/22  Thomas Hoff, MD  thiamine 100 MG tablet Take 1 tablet (100 mg total) by mouth daily. 03/21/22   Leroy Sea, MD  traZODone (DESYREL) 100 MG tablet Take 1 tablet (100 mg total) by mouth at bedtime. 03/20/22   Leroy Sea, MD     Objective    Physical Exam: Vitals:   05/25/22 1941 05/25/22 0419 05/25/22 0530 05/25/22 0545  BP: 95/60 95/60 91/60  105/69  Pulse: 76 76 69 87  Resp: 15  15 15   Temp:      TempSrc:      SpO2: 93%  94% 97%  Weight:      Height:        General: appears to be stated age; alert, oriented Skin: warm, dry, no rash Head:  AT/[place] Mouth:  Oral mucosa membranes appear moist, normal dentition Neck: supple; trachea midline Heart:  RRR; did not appreciate any M/R/G Lungs: CTAB, did not appreciate any wheezes, rales, or rhonchi Abdomen: + BS; soft, ND, NT Vascular: 2+ pedal pulses b/l; 2+ radial pulses b/l Extremities: no peripheral edema, no muscle wasting Neuro: strength and sensation intact in upper and lower extremities b/l   Labs on Admission: I have personally reviewed following labs and imaging studies  CBC: Recent Labs  Lab 05/24/22 2214 05/25/22 0346  WBC 6.3 4.4  NEUTROABS 3.1 2.4  HGB 15.8 15.1  HCT 45.8 43.5  MCV 96.6 97.3  PLT 247 [id number]   Basic Metabolic Panel: Recent Labs  Lab 05/24/22 2214 05/25/22 0346  NA 140 143   K 4.1 4.1  CL 104 103  CO2 21* 23  GLUCOSE 104* 78  BUN 16 16  CREATININE 0.93 1.02  CALCIUM 9.7 9.5  MG  --  1.8  PHOS  --  3.5   GFR: Estimated Creatinine Clearance: 99.7 mL/min (by C-G formula based on SCr of 1.02 mg/dL). Liver Function Tests: Recent Labs  Lab 05/24/22 2214 05/25/22 0346  AST 42* 40  ALT 45* 45*  ALKPHOS 62 54  BILITOT 0.3 0.7  PROT 7.0 6.7  ALBUMIN 4.7 4.4   No results for input(s): "LIPASE", "AMYLASE" in the last 168 hours. No results for input(s): "AMMONIA" in the last 168 hours. Coagulation Profile: No results for input(s): "INR", "PROTIME" in the last 168 hours. Cardiac Enzymes: No results for input(s): "CKTOTAL", "CKMB", "CKMBINDEX", "TROPONINI" in the last 168 hours. BNP (last 3 results) No results for input(s): "PROBNP" in the last 8760 hours. HbA1C: No results for input(s): "HGBA1C" in the last 72 hours. CBG: No results for input(s): "GLUCAP" in the last 168 hours. Lipid Profile: No results for input(s): "CHOL", "HDL", "LDLCALC", "TRIG", "CHOLHDL", "LDLDIRECT" in the last 72 hours. Thyroid Function Tests: No results for input(s): "TSH", "T4TOTAL", "FREET4", "T3FREE", "THYROIDAB" in the last 72 hours. Anemia Panel: No results for input(s): "VITAMINB12", "FOLATE", "FERRITIN", "TIBC", "IRON", "RETICCTPCT" in the last 72 hours. Urine analysis:    Component Value Date/Time   COLORURINE YELLOW 03/19/2022 0450   APPEARANCEUR CLOUDY (A) 03/19/2022 0450   LABSPEC 1.016 03/19/2022 0450   PHURINE 7.0 03/19/2022 0450   GLUCOSEU NEGATIVE 03/19/2022 0450   HGBUR NEGATIVE 03/19/2022 0450   BILIRUBINUR NEGATIVE 03/19/2022 0450   KETONESUR NEGATIVE 03/19/2022 0450   PROTEINUR 30 (A) 03/19/2022 0450   NITRITE NEGATIVE 03/19/2022 0450   LEUKOCYTESUR NEGATIVE 03/19/2022 0450    Radiological Exams on Admission: CT Head Wo Contrast  Result Date: 05/24/2022 CLINICAL DATA:  Head and neck trauma, abnormal mental status. EXAM: CT HEAD WITHOUT CONTRAST  CT CERVICAL SPINE WITHOUT CONTRAST TECHNIQUE: Multidetector CT imaging of the head and cervical spine was performed following the standard protocol without intravenous contrast. Multiplanar CT image reconstructions of the cervical spine were also generated. RADIATION DOSE REDUCTION: This exam was performed according to the departmental dose-optimization program which includes automated exposure control, adjustment of the mA and/or kV according to patient size and/or use of iterative reconstruction technique. COMPARISON:  CT head dated April 24, 2022 FINDINGS: CT HEAD FINDINGS Brain: No evidence of acute infarction, hemorrhage, hydrocephalus, extra-axial collection or mass lesion/mass effect. Mild cerebral volume loss. Vascular: No hyperdense vessel or unexpected calcification. Skull: Normal. Negative for fracture or focal lesion. Sinuses/Orbits: No acute finding. Other: None. CT CERVICAL SPINE FINDINGS Alignment: Straightening of the cervical spine, which could  be positional. Skull base and vertebrae: No acute fracture. No primary bone lesion or focal pathologic process. Soft tissues and spinal canal: No prevertebral fluid or swelling. No visible canal hematoma. Disc levels: No significant disc bulge, spinal canal or neural foraminal stenosis. Upper chest: Negative. Other: None IMPRESSION: 1.  No acute intracranial abnormality. 2.  Mild cerebral atrophy, which is advanced for age. 3. No acute cervical spine fracture or traumatic subluxation. No significant paraspinal soft tissue abnormality. Electronically Signed   By: Keane Police D.O.   On: 05/24/2022 23:08   CT Cervical Spine Wo Contrast  Result Date: 05/24/2022 CLINICAL DATA:  Head and neck trauma, abnormal mental status. EXAM: CT HEAD WITHOUT CONTRAST CT CERVICAL SPINE WITHOUT CONTRAST TECHNIQUE: Multidetector CT imaging of the head and cervical spine was performed following the standard protocol without intravenous contrast. Multiplanar CT image  reconstructions of the cervical spine were also generated. RADIATION DOSE REDUCTION: This exam was performed according to the departmental dose-optimization program which includes automated exposure control, adjustment of the mA and/or kV according to patient size and/or use of iterative reconstruction technique. COMPARISON:  CT head dated April 24, 2022 FINDINGS: CT HEAD FINDINGS Brain: No evidence of acute infarction, hemorrhage, hydrocephalus, extra-axial collection or mass lesion/mass effect. Mild cerebral volume loss. Vascular: No hyperdense vessel or unexpected calcification. Skull: Normal. Negative for fracture or focal lesion. Sinuses/Orbits: No acute finding. Other: None. CT CERVICAL SPINE FINDINGS Alignment: Straightening of the cervical spine, which could be positional. Skull base and vertebrae: No acute fracture. No primary bone lesion or focal pathologic process. Soft tissues and spinal canal: No prevertebral fluid or swelling. No visible canal hematoma. Disc levels: No significant disc bulge, spinal canal or neural foraminal stenosis. Upper chest: Negative. Other: None IMPRESSION: 1.  No acute intracranial abnormality. 2.  Mild cerebral atrophy, which is advanced for age. 3. No acute cervical spine fracture or traumatic subluxation. No significant paraspinal soft tissue abnormality. Electronically Signed   By: Keane Police D.O.   On: 05/24/2022 23:08   DG Chest 1 View  Result Date: 05/24/2022 CLINICAL DATA:  Recent seizure activity and fall with chest pain, initial encounter EXAM: PORTABLE CHEST 1 VIEW COMPARISON:  12/26/2021 FINDINGS: The heart size and mediastinal contours are within normal limits. Both lungs are clear. The visualized skeletal structures are unremarkable. IMPRESSION: No active disease. Electronically Signed   By: Inez Catalina M.D.   On: 05/24/2022 22:50   DG Foot Complete Right  Result Date: 05/24/2022 CLINICAL DATA:  Recent foot injury, initial encounter EXAM: RIGHT FOOT  COMPLETE - 3+ VIEW COMPARISON:  None Available. FINDINGS: There is an avulsion fracture from the anterolateral aspect of the calcaneus. Mild soft tissue swelling is noted laterally. No other fracture is seen. IMPRESSION: Avulsion from the anterolateral calcaneus with mild soft tissue swelling. Electronically Signed   By: Inez Catalina M.D.   On: 05/24/2022 22:49     EKG: Independently reviewed, with result as described above.    Assessment/Plan   Principal Problem:   Seizures ([place]) Active Problems:   Essential hypertension   Chronic alcohol abuse   Avulsion fracture of ankle       #) Generalized tonic-clonic seizures: Report of 4 separate episodes of seizures, with 2 of such witnessed by medical staff in the ED, and were noted to involve generalized tonic-clonic shaking in all 4 extremities associated with diminished responsiveness and lasting for approximate 10 seconds before spontaneous resolution without interval antiepileptic medications and separately associated with postictal  state lasting approximately 5 minutes before returning back to baseline mental status.  This is in the context of a documented history of seizures, with the patient reporting suboptimal compliance on his outpatient Keppra, which is felt to be a significant contributing factor leading to his seizures today, as alcohol withdrawal seizures, possible, or felt to be less likely given his acute alcohol intoxication this evening, with initial serum ethanol level 333.  CT head showed no evidence of acute process.  No evidence of underlying infection.  No evidence of significant electrolyte contribution, although his serum magnesium level is pending.  EDP discussed the patient's case with the on-call neurologist, Dr. Amada Jupiter , Who will formally consult and has recommended admission to the hospitalist service for further evaluation management of seizure episodes, with additional recommendations to continue home Keppra,  repeat EEG, and to initiate CIWA protocol.  Of note, patient has been loaded with Keppra 1 g IV x1 in the ED.  Is awake alert, and appears to be at baseline mental status at this time.  Plan: Neurology consulted, as above.  Resume home Keppra.  EEG, as above.  CIWA protocol.  Every 4 hours neurochecks ordered.  Repeat CMP and CBC in the morning add on serum magnesium level.  Check urinary drug screen.          #) Chronic Alcohol Abuse: With evidence of acute alcohol intoxication. the patient reports typical daily alcohol consumption of 1/5 of hard liquor per day, and that typical daily alcohol consumption of this volume has been occurring for multiple years. Denies any interest in alcohol consumption discontinuation at this time. Most recent alcohol consumption occurred, per patient, and the last 1 to 2 days.  Presenting serum ethanol level 333, rendering immediate likelihood of acute alcohol withdrawal to appear less likely.  Will closely monitor for ensuing evidence of alcohol withdrawal, as further detailed below, will initiating CIWA protocol, per recommendation of ensuing neurology consultation, as above.  Plan: counseled the patient on the importance of reduction in alcohol consumption. Consult to transition of care team placed. Close monitoring of ensuing BP and HR via routine VS. Symptoms-based CIWA protocol with prn Ativan ordered. Seizure precautions. Telemetry. Add-on serum Mg level. Check serum phosphorus level. Repeat CMP in the morning.  UDS. Banana bag x1 has been ordered followed by initiation of oral thiamine, folic acid, and multivitamin supplementation to a current acute daily basis, with first dose doses to occur in the morning of 05/25/2022.          #) Avulsion fracture of the right calcaneus: Noted on plain films of the right foot after recent fall in the preceding days, as further detailed above.  Right laboratory appears to be neurovascular intact.  Will strive for  pain control now, with potential for outpatient orthopedic surgery consultation.  Plan: Prn acetaminophen.             #) Essential Hypertension: documented h/o such, with outpatient antihypertensive regimen consisting of Coreg.  SBP's in the ED today: 90s to low 100s mmHg. consequently, will hold home Coreg for now, closely monitoring ensuing blood pressure and heart rate on telemetry to help guide timing of resumption of his home antihypertensive medication.  Plan: Close monitoring of subsequent BP via routine VS. holding home Coreg for now, as above.       DVT prophylaxis: SCD's   Code Status: Full code Family Communication: none Disposition Plan: Per Rounding Team Consults called: Dr. Amada Jupiter of Neurology has been consulted, as  further detailed above;  Admission status: Observation  Warrants inpatient status on basis of    PLEASE NOTE THAT DRAGON DICTATION SOFTWARE WAS USED IN THE CONSTRUCTION OF THIS NOTE.   [place] DO [facility]  From [place]   05/25/2022, 6:31 AM

## 2022-05-25 NOTE — Discharge Summary (Signed)
Physician Discharge Summary   Patient: Adam Mcshan Mortell Jr. MRN: [id number] DOB: 09-Jul-1986  Admit date:     05/24/2022  Discharge date: 05/25/2022  Discharge Physician: Lynden Oxford   PCP: Patient, No Pcp Per   Recommendations at discharge:  Follow-up with PCP in 1 week. Follow-up with neurology as recommended.  Discharge Diagnoses: Principal Problem:   Seizures (HCC) Active Problems:   Alcohol withdrawal seizure (HCC)   Essential hypertension   Chronic alcohol abuse   Avulsion fracture of ankle  Hospital Course: Presented to the hospital with 4 episodes of tonic-clonic appearing seizures.  They were witnessed events.  Patient has history of alcohol abuse.  His alcohol level was 333.  CT of the head and C-spine were unremarkable for any acute abnormality. Patient is supposed to be on Keppra which he does not take on a regular basis.  Last Keppra dose was a week ago.  Patient was started back on Keppra after discussion with neurology. Neurology recommended EEG which the patient refused. Patient was mentating okay tolerating oral diet. No significant withdrawals and is seen as well. Patient was prescribed gabapentin and recommended to establish care outpatient for alcohol rehabilitation. Patient was also explained the risk of seizure and its effect. Patient verbalized understanding. Patient was explained that he cannot drive or operate any heavy machinery per [place] DMV.   Consultants: Neurology Procedures performed: none  Disposition: Home Diet recommendation:  Discharge Diet Orders (From admission, onward)     Start     Ordered   05/25/22 0000  Diet general        05/25/22 1541           Cardiac diet DISCHARGE MEDICATION: Allergies as of 05/25/2022   No Known Allergies      Medication List     STOP taking these medications    ibuprofen 200 MG tablet Commonly known as: ADVIL   lisinopril 20 MG tablet Commonly known as: ZESTRIL   traZODone 100 MG  tablet Commonly known as: DESYREL       TAKE these medications    acetaminophen 500 MG tablet Commonly known as: TYLENOL Take 500 mg by mouth daily as needed (pain).   folic acid 1 MG tablet Commonly known as: FOLVITE Take 1 tablet (1 mg total) by mouth daily.   gabapentin 300 MG capsule Commonly known as: Neurontin Take 1 capsule (300 mg total) by mouth 2 (two) times daily.   levETIRAcetam 500 MG tablet Commonly known as: Keppra Take 1 tablet (500 mg total) by mouth 2 (two) times daily.   ONE-A-DAY MENS PO Take 1 tablet by mouth daily.   thiamine 100 MG tablet Take 1 tablet (100 mg total) by mouth daily.        Discharge Exam: Filed Weights   05/24/22 2210  Weight: 74.8 kg   General: Appear in mild distress, no Rash; Oral Mucosa Clear, moist. no Abnormal Neck Mass Or lumps, Conjunctiva normal  Cardiovascular: S1 and S2 Present, no Murmur, Respiratory: good respiratory effort, Bilateral Air entry present and CTA, no Crackles, no wheezes Abdomen: Bowel Sound present, Soft and no tenderness Extremities: no Pedal edema Neurology: alert and oriented to time, place, and person affect appropriate. no new focal deficit Gait not checked due to patient safety concerns   Condition at discharge: good  The results of significant diagnostics from this hospitalization (including imaging, microbiology, ancillary and laboratory) are listed below for reference.   Imaging Studies: CT Head Wo Contrast  Result Date: 05/24/2022  CLINICAL DATA:  Head and neck trauma, abnormal mental status. EXAM: CT HEAD WITHOUT CONTRAST CT CERVICAL SPINE WITHOUT CONTRAST TECHNIQUE: Multidetector CT imaging of the head and cervical spine was performed following the standard protocol without intravenous contrast. Multiplanar CT image reconstructions of the cervical spine were also generated. RADIATION DOSE REDUCTION: This exam was performed according to the departmental dose-optimization program which  includes automated exposure control, adjustment of the mA and/or kV according to patient size and/or use of iterative reconstruction technique. COMPARISON:  CT head dated April 24, 2022 FINDINGS: CT HEAD FINDINGS Brain: No evidence of acute infarction, hemorrhage, hydrocephalus, extra-axial collection or mass lesion/mass effect. Mild cerebral volume loss. Vascular: No hyperdense vessel or unexpected calcification. Skull: Normal. Negative for fracture or focal lesion. Sinuses/Orbits: No acute finding. Other: None. CT CERVICAL SPINE FINDINGS Alignment: Straightening of the cervical spine, which could be positional. Skull base and vertebrae: No acute fracture. No primary bone lesion or focal pathologic process. Soft tissues and spinal canal: No prevertebral fluid or swelling. No visible canal hematoma. Disc levels: No significant disc bulge, spinal canal or neural foraminal stenosis. Upper chest: Negative. Other: None IMPRESSION: 1.  No acute intracranial abnormality. 2.  Mild cerebral atrophy, which is advanced for age. 3. No acute cervical spine fracture or traumatic subluxation. No significant paraspinal soft tissue abnormality. Electronically Signed   By: Keane Police D.O.   On: 05/24/2022 23:08   CT Cervical Spine Wo Contrast  Result Date: 05/24/2022 CLINICAL DATA:  Head and neck trauma, abnormal mental status. EXAM: CT HEAD WITHOUT CONTRAST CT CERVICAL SPINE WITHOUT CONTRAST TECHNIQUE: Multidetector CT imaging of the head and cervical spine was performed following the standard protocol without intravenous contrast. Multiplanar CT image reconstructions of the cervical spine were also generated. RADIATION DOSE REDUCTION: This exam was performed according to the departmental dose-optimization program which includes automated exposure control, adjustment of the mA and/or kV according to patient size and/or use of iterative reconstruction technique. COMPARISON:  CT head dated April 24, 2022 FINDINGS: CT HEAD FINDINGS  Brain: No evidence of acute infarction, hemorrhage, hydrocephalus, extra-axial collection or mass lesion/mass effect. Mild cerebral volume loss. Vascular: No hyperdense vessel or unexpected calcification. Skull: Normal. Negative for fracture or focal lesion. Sinuses/Orbits: No acute finding. Other: None. CT CERVICAL SPINE FINDINGS Alignment: Straightening of the cervical spine, which could be positional. Skull base and vertebrae: No acute fracture. No primary bone lesion or focal pathologic process. Soft tissues and spinal canal: No prevertebral fluid or swelling. No visible canal hematoma. Disc levels: No significant disc bulge, spinal canal or neural foraminal stenosis. Upper chest: Negative. Other: None IMPRESSION: 1.  No acute intracranial abnormality. 2.  Mild cerebral atrophy, which is advanced for age. 3. No acute cervical spine fracture or traumatic subluxation. No significant paraspinal soft tissue abnormality. Electronically Signed   By: Keane Police D.O.   On: 05/24/2022 23:08   DG Chest 1 View  Result Date: 05/24/2022 CLINICAL DATA:  Recent seizure activity and fall with chest pain, initial encounter EXAM: PORTABLE CHEST 1 VIEW COMPARISON:  12/26/2021 FINDINGS: The heart size and mediastinal contours are within normal limits. Both lungs are clear. The visualized skeletal structures are unremarkable. IMPRESSION: No active disease. Electronically Signed   By: Inez Catalina M.D.   On: 05/24/2022 22:50   DG Foot Complete Right  Result Date: 05/24/2022 CLINICAL DATA:  Recent foot injury, initial encounter EXAM: RIGHT FOOT COMPLETE - 3+ VIEW COMPARISON:  None Available. FINDINGS: There is an avulsion fracture  from the anterolateral aspect of the calcaneus. Mild soft tissue swelling is noted laterally. No other fracture is seen. IMPRESSION: Avulsion from the anterolateral calcaneus with mild soft tissue swelling. Electronically Signed   By: Inez Catalina M.D.   On: 05/24/2022 22:49     Microbiology: Results for orders placed or performed during the hospital encounter of 03/18/22  Resp Panel by RT-PCR (Flu A&B, Covid) Nasopharyngeal Swab     Status: None   Collection Time: 03/18/22  9:04 PM   Specimen: Nasopharyngeal Swab; Nasopharyngeal(NP) swabs in vial transport medium  Result Value Ref Range Status   SARS Coronavirus 2 by RT PCR NEGATIVE NEGATIVE Final    Comment: (NOTE) SARS-CoV-2 target nucleic acids are NOT DETECTED.  The SARS-CoV-2 RNA is generally detectable in upper respiratory specimens during the acute phase of infection. The lowest concentration of SARS-CoV-2 viral copies this assay can detect is 138 copies/mL. A negative result does not preclude SARS-Cov-2 infection and should not be used as the sole basis for treatment or other patient management decisions. A negative result may occur with  improper specimen collection/handling, submission of specimen other than nasopharyngeal swab, presence of viral mutation(s) within the areas targeted by this assay, and inadequate number of viral copies(<138 copies/mL). A negative result must be combined with clinical observations, patient history, and epidemiological information. The expected result is Negative.  Fact Sheet for Patients:  EntrepreneurPulse.com.au  Fact Sheet for Healthcare Providers:  IncredibleEmployment.be  This test is no t yet approved or cleared by the [place] FDA and  has been authorized for detection and/or diagnosis of SARS-CoV-2 by FDA under an Emergency Use Authorization (EUA). This EUA will remain  in effect (meaning this test can be used) for the duration of the COVID-19 declaration under Section 564(b)(1) of the Act, 21 U.S.C.section 360bbb-3(b)(1), unless the authorization is terminated  or revoked sooner.       Influenza A by PCR NEGATIVE NEGATIVE Final   Influenza B by PCR NEGATIVE NEGATIVE Final    Comment: (NOTE) The Xpert  Xpress SARS-CoV-2/FLU/RSV plus assay is intended as an aid in the diagnosis of influenza from Nasopharyngeal swab specimens and should not be used as a sole basis for treatment. Nasal washings and aspirates are unacceptable for Xpert Xpress SARS-CoV-2/FLU/RSV testing.  Fact Sheet for Patients: EntrepreneurPulse.com.au  Fact Sheet for Healthcare Providers: IncredibleEmployment.be  This test is not yet approved or cleared by the [place] FDA and has been authorized for detection and/or diagnosis of SARS-CoV-2 by FDA under an Emergency Use Authorization (EUA). This EUA will remain in effect (meaning this test can be used) for the duration of the COVID-19 declaration under Section 564(b)(1) of the Act, 21 U.S.C. section 360bbb-3(b)(1), unless the authorization is terminated or revoked.  Performed at [facility], [place] [address] [place], [place] [address]     Labs: CBC: Recent Labs  Lab 05/24/22 2214 05/25/22 0346  WBC 6.3 4.4  NEUTROABS 3.1 2.4  HGB 15.8 15.1  HCT 45.8 43.5  MCV 96.6 97.3  PLT 247 [id number]   Basic Metabolic Panel: Recent Labs  Lab 05/24/22 2214 05/25/22 0346  NA 140 143  K 4.1 4.1  CL 104 103  CO2 21* 23  GLUCOSE 104* 78  BUN 16 16  CREATININE 0.93 1.02  CALCIUM 9.7 9.5  MG  --  1.8  PHOS  --  3.5   Liver Function Tests: Recent Labs  Lab 05/24/22 2214 05/25/22 0346  AST 42* 40  ALT  45* 45*  ALKPHOS 62 54  BILITOT 0.3 0.7  PROT 7.0 6.7  ALBUMIN 4.7 4.4   CBG: No results for input(s): "GLUCAP" in the last 168 hours.  Discharge time spent: greater than 30 minutes.  Signed: Lynden Oxford, MD [facility] 05/25/2022

## 2022-08-30 ENCOUNTER — Emergency Department (HOSPITAL_COMMUNITY): Payer: Self-pay

## 2022-08-30 ENCOUNTER — Encounter (HOSPITAL_COMMUNITY): Payer: Self-pay

## 2022-08-30 ENCOUNTER — Inpatient Hospital Stay (HOSPITAL_COMMUNITY)
Admission: EM | Admit: 2022-08-30 | Discharge: 2022-09-02 | DRG: 101 | Disposition: A | Discharge status: Home / Self Care | Payer: Self-pay | Attending: Internal Medicine | Admitting: Internal Medicine

## 2022-08-30 DIAGNOSIS — Z79899 Other long term (current) drug therapy: Secondary | ICD-10-CM

## 2022-08-30 DIAGNOSIS — E860 Dehydration: Secondary | ICD-10-CM | POA: Diagnosis present

## 2022-08-30 DIAGNOSIS — Z91148 Patient's other noncompliance with medication regimen for other reason: Secondary | ICD-10-CM

## 2022-08-30 DIAGNOSIS — I10 Essential (primary) hypertension: Secondary | ICD-10-CM | POA: Diagnosis present

## 2022-08-30 DIAGNOSIS — E8729 Other acidosis: Secondary | ICD-10-CM

## 2022-08-30 DIAGNOSIS — X58XXXA Exposure to other specified factors, initial encounter: Secondary | ICD-10-CM | POA: Diagnosis present

## 2022-08-30 DIAGNOSIS — K760 Fatty (change of) liver, not elsewhere classified: Secondary | ICD-10-CM | POA: Diagnosis present

## 2022-08-30 DIAGNOSIS — F10139 Alcohol abuse with withdrawal, unspecified: Secondary | ICD-10-CM | POA: Diagnosis present

## 2022-08-30 DIAGNOSIS — R7401 Elevation of levels of liver transaminase levels: Secondary | ICD-10-CM | POA: Diagnosis present

## 2022-08-30 DIAGNOSIS — R569 Unspecified convulsions: Principal | ICD-10-CM

## 2022-08-30 DIAGNOSIS — F10930 Alcohol use, unspecified with withdrawal, uncomplicated: Principal | ICD-10-CM

## 2022-08-30 DIAGNOSIS — Y929 Unspecified place or not applicable: Secondary | ICD-10-CM

## 2022-08-30 DIAGNOSIS — N179 Acute kidney failure, unspecified: Secondary | ICD-10-CM

## 2022-08-30 DIAGNOSIS — F10129 Alcohol abuse with intoxication, unspecified: Secondary | ICD-10-CM | POA: Diagnosis present

## 2022-08-30 DIAGNOSIS — Z8249 Family history of ischemic heart disease and other diseases of the circulatory system: Secondary | ICD-10-CM

## 2022-08-30 DIAGNOSIS — G40909 Epilepsy, unspecified, not intractable, without status epilepticus: Principal | ICD-10-CM | POA: Diagnosis present

## 2022-08-30 DIAGNOSIS — E872 Acidosis, unspecified: Secondary | ICD-10-CM | POA: Insufficient documentation

## 2022-08-30 DIAGNOSIS — Y907 Blood alcohol level of 200-239 mg/100 ml: Secondary | ICD-10-CM | POA: Diagnosis present

## 2022-08-30 DIAGNOSIS — Z720 Tobacco use: Secondary | ICD-10-CM

## 2022-08-30 DIAGNOSIS — F1010 Alcohol abuse, uncomplicated: Secondary | ICD-10-CM | POA: Diagnosis present

## 2022-08-30 DIAGNOSIS — S60511A Abrasion of right hand, initial encounter: Secondary | ICD-10-CM | POA: Diagnosis present

## 2022-08-30 LAB — URINALYSIS, ROUTINE W REFLEX MICROSCOPIC
Bilirubin Urine: NEGATIVE
Glucose, UA: 50 mg/dL — AB
Hgb urine dipstick: NEGATIVE
Ketones, ur: NEGATIVE mg/dL
Leukocytes,Ua: NEGATIVE
Nitrite: NEGATIVE
Protein, ur: NEGATIVE mg/dL
Specific Gravity, Urine: 1.018 (ref 1.005–1.030)
pH: 6 (ref 5.0–8.0)

## 2022-08-30 LAB — ETHANOL: Alcohol, Ethyl (B): 216 mg/dL — ABNORMAL HIGH (ref <10)

## 2022-08-30 LAB — COMPREHENSIVE METABOLIC PANEL
ALT: 79 U/L — ABNORMAL HIGH (ref 0–44)
AST: 77 U/L — ABNORMAL HIGH (ref 15–41)
Albumin: 4.2 g/dL (ref 3.5–5.0)
Alkaline Phosphatase: 53 U/L (ref 38–126)
Anion gap: 16 — ABNORMAL HIGH (ref 5–15)
BUN: 28 mg/dL — ABNORMAL HIGH (ref 6–20)
CO2: 21 mmol/L — ABNORMAL LOW (ref 22–32)
Calcium: 9.2 mg/dL (ref 8.9–10.3)
Chloride: 98 mmol/L (ref 98–111)
Creatinine, Ser: 1.44 mg/dL — ABNORMAL HIGH (ref 0.61–1.24)
GFR, Estimated: >60 mL/min (ref >60)
Glucose, Bld: 84 mg/dL (ref 70–99)
Potassium: 4.6 mmol/L (ref 3.5–5.1)
Sodium: 135 mmol/L (ref 135–145)
Total Bilirubin: 0.6 mg/dL (ref 0.3–1.2)
Total Protein: 6.6 g/dL (ref 6.5–8.1)

## 2022-08-30 LAB — CBC WITH DIFFERENTIAL/PLATELET
Abs Immature Granulocytes: 0.02 10*3/uL (ref 0.00–0.07)
Basophils Absolute: 0.1 10*3/uL (ref 0.0–0.1)
Basophils Relative: 1 %
Eosinophils Absolute: 0.1 10*3/uL (ref 0.0–0.5)
Eosinophils Relative: 2 %
HCT: 40.3 % (ref 39.0–52.0)
Hemoglobin: 14.1 g/dL (ref 13.0–17.0)
Immature Granulocytes: 0 %
Lymphocytes Relative: 20 %
Lymphs Abs: 1.2 10*3/uL (ref 0.7–4.0)
MCH: 34.4 pg — ABNORMAL HIGH (ref 26.0–34.0)
MCHC: 35 g/dL (ref 30.0–36.0)
MCV: 98.3 fL (ref 80.0–100.0)
Monocytes Absolute: 0.4 10*3/uL (ref 0.1–1.0)
Monocytes Relative: 6 %
Neutro Abs: 4.2 10*3/uL (ref 1.7–7.7)
Neutrophils Relative %: 71 %
Platelets: 263 10*3/uL (ref 150–400)
RBC: 4.1 MIL/uL — ABNORMAL LOW (ref 4.22–5.81)
RDW: 12.3 % (ref 11.5–15.5)
WBC: 5.9 10*3/uL (ref 4.0–10.5)
nRBC: 0 % (ref 0.0–0.2)

## 2022-08-30 LAB — LACTIC ACID, PLASMA
Lactic Acid, Venous: 3.5 mmol/L (ref 0.5–1.9)
Lactic Acid, Venous: 2.5 mmol/L (ref 0.5–1.9)

## 2022-08-30 LAB — PROTIME-INR
INR: 0.9 (ref 0.8–1.2)
Prothrombin Time: 11.9 seconds (ref 11.4–15.2)

## 2022-08-30 LAB — CBG MONITORING, ED: Glucose-Capillary: 94 mg/dL (ref 70–99)

## 2022-08-30 MED ORDER — SENNOSIDES-DOCUSATE SODIUM 8.6-50 MG PO TABS
1.0000 | ORAL_TABLET | Freq: Every evening | ORAL | Status: DC | PRN
  Filled 2022-08-30: qty 1

## 2022-08-30 MED ORDER — LORAZEPAM 1 MG PO TABS: 1.0000 mg | ORAL_TABLET | ORAL | Status: DC | PRN

## 2022-08-30 MED ORDER — SODIUM CHLORIDE 0.9 % IV SOLN
260.0000 mg | Freq: Once | INTRAVENOUS | Status: DC
  Filled 2022-08-30: qty 2

## 2022-08-30 MED ORDER — PHENOBARBITAL SODIUM 65 MG/ML IJ SOLN
260.0000 mg | Freq: Once | INTRAMUSCULAR | Status: DC
  Filled 2022-08-30: qty 4

## 2022-08-30 MED ORDER — ACETAMINOPHEN 650 MG RE SUPP: 650.0000 mg | Freq: Four times a day (QID) | RECTAL | Status: DC | PRN

## 2022-08-30 MED ORDER — FOLIC ACID 1 MG PO TABS
1.0000 mg | ORAL_TABLET | Freq: Every day | ORAL | Status: DC
  Administered 2022-08-31 – 2022-09-02 (×3): 1 mg via ORAL
  Filled 2022-08-30 (×3): qty 1

## 2022-08-30 MED ORDER — LEVETIRACETAM 500 MG PO TABS
500.0000 mg | ORAL_TABLET | Freq: Two times a day (BID) | ORAL | Status: DC
  Administered 2022-08-31 – 2022-09-02 (×5): 500 mg via ORAL
  Filled 2022-08-30 (×5): qty 1

## 2022-08-30 MED ORDER — ONDANSETRON HCL 4 MG PO TABS: 4.0000 mg | ORAL_TABLET | Freq: Four times a day (QID) | ORAL | Status: DC | PRN

## 2022-08-30 MED ORDER — THIAMINE MONONITRATE 100 MG PO TABS: 100.0000 mg | ORAL_TABLET | Freq: Every day | ORAL | Status: DC

## 2022-08-30 MED ORDER — SODIUM CHLORIDE 0.9% FLUSH
3.0000 mL | Freq: Two times a day (BID) | INTRAVENOUS | Status: DC
  Administered 2022-09-01 – 2022-09-02 (×2): 3 mL via INTRAVENOUS

## 2022-08-30 MED ORDER — LORAZEPAM 2 MG/ML IJ SOLN: 2.0000 mg | Freq: Once | INTRAMUSCULAR | Status: AC

## 2022-08-30 MED ORDER — ADULT MULTIVITAMIN W/MINERALS CH: 1.0000 | ORAL_TABLET | Freq: Every day | ORAL | Status: DC

## 2022-08-30 MED ORDER — FOLIC ACID 1 MG PO TABS: 1.0000 mg | ORAL_TABLET | Freq: Every day | ORAL | Status: DC

## 2022-08-30 MED ORDER — LORAZEPAM 2 MG/ML IJ SOLN
0.0000 mg | INTRAMUSCULAR | Status: AC
  Administered 2022-08-31 – 2022-09-01 (×4): 2 mg via INTRAVENOUS
  Filled 2022-08-30 (×6): qty 1

## 2022-08-30 MED ORDER — ONDANSETRON HCL 4 MG/2ML IJ SOLN: 4.0000 mg | Freq: Once | INTRAMUSCULAR | Status: AC

## 2022-08-30 MED ORDER — LORAZEPAM 1 MG PO TABS
1.0000 mg | ORAL_TABLET | ORAL | Status: DC | PRN
  Administered 2022-09-02: 1 mg via ORAL
  Filled 2022-08-30: qty 1

## 2022-08-30 MED ORDER — LEVETIRACETAM IN NACL 1000 MG/100ML IV SOLN
1000.0000 mg | Freq: Once | INTRAVENOUS | Status: AC
  Administered 2022-08-30: 1000 mg via INTRAVENOUS
  Filled 2022-08-30: qty 100

## 2022-08-30 MED ORDER — LORAZEPAM 2 MG/ML IJ SOLN
INTRAMUSCULAR | Status: AC
  Administered 2022-08-30: 2 mg via INTRAVENOUS
  Filled 2022-08-30: qty 1

## 2022-08-30 MED ORDER — SODIUM CHLORIDE 0.9 % IV BOLUS
1000.0000 mL | Freq: Once | INTRAVENOUS | Status: AC
  Administered 2022-08-30: 1000 mL via INTRAVENOUS

## 2022-08-30 MED ORDER — ONDANSETRON HCL 4 MG/2ML IJ SOLN: 4.0000 mg | Freq: Four times a day (QID) | INTRAMUSCULAR | Status: DC | PRN

## 2022-08-30 MED ORDER — ENOXAPARIN SODIUM 40 MG/0.4ML IJ SOSY
40.0000 mg | PREFILLED_SYRINGE | INTRAMUSCULAR | Status: DC
  Administered 2022-08-30 – 2022-09-01 (×3): 40 mg via SUBCUTANEOUS
  Filled 2022-08-30 (×3): qty 0.4

## 2022-08-30 MED ORDER — LORAZEPAM 2 MG/ML IJ SOLN
0.0000 mg | Freq: Three times a day (TID) | INTRAMUSCULAR | Status: DC
  Filled 2022-08-30: qty 1

## 2022-08-30 MED ORDER — SODIUM CHLORIDE 0.9 % IV SOLN: INTRAVENOUS | Status: DC

## 2022-08-30 MED ORDER — LORAZEPAM 2 MG/ML IJ SOLN
1.0000 mg | INTRAMUSCULAR | Status: DC | PRN
  Administered 2022-08-30: 2 mg via INTRAVENOUS
  Filled 2022-08-30: qty 1

## 2022-08-30 MED ORDER — THIAMINE HCL 100 MG/ML IJ SOLN
100.0000 mg | Freq: Every day | INTRAMUSCULAR | Status: DC
  Administered 2022-08-30: 100 mg via INTRAVENOUS
  Filled 2022-08-30: qty 2

## 2022-08-30 MED ORDER — THIAMINE MONONITRATE 100 MG PO TABS
100.0000 mg | ORAL_TABLET | Freq: Every day | ORAL | Status: DC
  Administered 2022-08-31 – 2022-09-02 (×3): 100 mg via ORAL
  Filled 2022-08-30 (×3): qty 1

## 2022-08-30 MED ORDER — LORAZEPAM 2 MG/ML IJ SOLN
1.0000 mg | INTRAMUSCULAR | Status: DC | PRN
  Administered 2022-08-31: 1 mg via INTRAVENOUS
  Administered 2022-08-31 – 2022-09-02 (×3): 2 mg via INTRAVENOUS
  Filled 2022-08-30: qty 1
  Filled 2022-08-30: qty 2

## 2022-08-30 MED ORDER — ONDANSETRON HCL 4 MG/2ML IJ SOLN
INTRAMUSCULAR | Status: AC
  Administered 2022-08-30: 4 mg via INTRAVENOUS
  Filled 2022-08-30: qty 2

## 2022-08-30 MED ORDER — THIAMINE HCL 100 MG/ML IJ SOLN: 100.0000 mg | Freq: Every day | INTRAMUSCULAR | Status: DC

## 2022-08-30 MED ORDER — ACETAMINOPHEN 325 MG PO TABS
650.0000 mg | ORAL_TABLET | Freq: Four times a day (QID) | ORAL | Status: DC | PRN
  Administered 2022-08-31: 650 mg via ORAL
  Filled 2022-08-30: qty 2

## 2022-08-30 MED ORDER — ADULT MULTIVITAMIN W/MINERALS CH
1.0000 | ORAL_TABLET | Freq: Every day | ORAL | Status: DC
  Administered 2022-08-31 – 2022-09-02 (×3): 1 via ORAL
  Filled 2022-08-30 (×3): qty 1

## 2022-08-30 NOTE — TOC CAGE-AID Note (Addendum)
Transition of Care [facility]) - CAGE-AID Screening   Patient Details  Name: Adam [name] [employer]. MRN: [id number] Date of Birth: 11-May-1986  Transition of Care [facility]) CM/SW Contact:    Mia Milan C Tarpley-Carter, LCSWA Phone Number: 08/30/2022, 6:09 PM   Clinical Narrative: Pt participated in [place].  Pt stated he does drink ETOH.  Pt was offered resources, due to usage of ETOH.      Efraim Vanallen Tarpley-Carter, MSW, LCSW-A Pronouns:  She/Her/Hers [facility] Clinical Social Worker Direct Number:  [phone] [email or web address]   CAGE-AID Screening:    Have You Ever Felt You Ought to Cut Down on Your Drinking or Drug Use?: Yes Have People Annoyed You By [employer] Your Drinking Or Drug Use?: Yes Have You Felt Bad Or Guilty About Your Drinking Or Drug Use?: Yes Have You Ever Had a Drink or Used Drugs First Thing In The Morning to Steady Your Nerves or to Get Rid of a Hangover?: No CAGE-AID Score: 3  Substance Abuse Education Offered: Yes  Substance abuse interventions: Scientist, clinical (histocompatibility and immunogenetics)

## 2022-08-30 NOTE — ED Notes (Signed)
Patient transported to CT 

## 2022-08-30 NOTE — ED Notes (Signed)
Pt's HR on cardiac monitoring increased to 140s, RN notified and at bedside, pt found with vomit on the bed, pt states he thinks he had a seizure. [place] MD notified, verbal order for 2mg  ativan and 4mg  zofran

## 2022-08-30 NOTE — Progress Notes (Signed)
TOC needs have been meet at this time. Please reconsult if new social work issues arise, CSW signing off.   Ayan Heffington Tarpley-Carter, MSW, LCSW-A Pronouns:  She/Her/Hers [facility] Clinical Social Worker Direct Number:  [phone] [email or web address]

## 2022-08-30 NOTE — ED Triage Notes (Signed)
Pt BIB [facility] for seizure, pt parked at [facility] parking deck, was coming here, had a seizure while walking down stairs, R ankle pain with standing and pain to R side of ribs. Able to call on his own. Had 1 episode vomiting. Also had 1 witnessed seizure with EMS. 20g R AC. Given 4mg  zofran, 50 mL NS. Pt endorses headache and seeing spots. Hx of seizures but did not follow up with neurologist, does not take medications. Hx of ETOH withdrawal seizures, states he has been drinking a fifth a day recently. Last drink was around midnight last PM. 140/90, HR 96, SpO2 97%, CBG 89.

## 2022-08-30 NOTE — ED Provider Notes (Signed)
  Physical Exam  BP 108/66   Pulse 99   Resp 20   SpO2 94%     Procedures  Procedures  ED Course / MDM   Clinical Course as of 08/30/22 1643  Mon Aug 30, 2022  1626 Comprehensive metabolic panel [WS]    Clinical Course User Index [WS] Cristie Hem, MD   Medical Decision Making Amount and/or Complexity of Data Reviewed Labs: ordered. Decision-making details documented in ED Course. Radiology: ordered.  Risk OTC drugs. Prescription drug management. Decision regarding hospitalization.   79M, hx of EtOH use, EtOH withdrawal seizures, presenting after likely EtOH withdrawal seizure. On CIWA now, likely admit EtOH withdrawal. Pt wants to quit.  CT head negative for acute intracranial abnormality.  Patient lactic acid elevated to 3.5, initial ethanol level 216.  Patient had similar presentation during prior admission.  Additional labs concerning for an AKI with a creatinine of 1.44, mild anion gap acidosis with a bicarb of 21, anion gap 16, mildly elevated LFTs AST 77, ALT 79.  Hospitalist medicine consulted for admission.  The patient was loaded with 1 g of IV Keppra due to his prior history of seizures and neurology thought that he might benefit from long-term Keppra administration.  Hospitalist medicine consulted for admission and Dr. Myna Hidalgo accept the patient in admission.  Patient hemodynamically stable at time of admission, most recent CIWA score of 12.       Regan Lemming, MD 08/30/22 2003

## 2022-08-30 NOTE — ED Notes (Signed)
Pt called out stating he thinks he had a seizure and had thrown up. This RN assessed pt and gave 2mg  ativan.

## 2022-08-30 NOTE — ED Notes (Signed)
Lawsing MD informed of pt's critical lactic acid

## 2022-08-30 NOTE — ED Provider Notes (Signed)
[facility] EMERGENCY DEPARTMENT Provider Note  CSN: [id number] Arrival date & time: 08/30/22 1447  Chief Complaint(s) Seizures  HPI Adam [name] Hageman. is a 36 y.o. male with history of alcohol withdrawal seizure presenting to the emergency department with possible seizure.  Patient was parking here to be evaluated for alcohol withdrawal when he reports he suddenly blacked out and woke up on the stairs.  Bystander told him that he had a seizure.  EMS brought him to the emergency department.  He reports mild headache.  Reports history of prior alcohol withdrawal seizures.  Patient had a second seizure in the emergency department.  He reports nausea, shaking and tremulousness.  He reports last drink was last night   Past Medical History Past Medical History:  Diagnosis Date   Eating disorder    history of bulemia   HYPERTENSION 06/25/2010   Seizure [facility])    Patient Active Problem List   Diagnosis Date Noted   Seizures (HCC) 05/25/2022   Avulsion fracture of ankle 05/25/2022   Chronic alcohol abuse 04/24/2022   Acute alcoholic hallucinosis (HCC) 04/24/2022   Transaminitis 04/24/2022   Alcohol withdrawal delirium (HCC) 01/29/2020   Alcohol withdrawal seizure with complication, with unspecified complication (HCC) 01/28/2020   Seizure (HCC) 01/21/2020   Alcohol withdrawal seizure (HCC) 11/10/2019   Benzodiazepine withdrawal (HCC) 11/10/2019   Hypokalemia 11/10/2019   Adjustment disorder with depressed mood 11/10/2019   Alcohol withdrawal (HCC) 09/24/2019   Chest pain 09/24/2019   Alcohol withdrawal seizure without complication (HCC)    Pneumothorax 04/28/2019   Essential hypertension 06/25/2010   Home Medication(s) Prior to Admission medications   Medication Sig Start Date End Date Taking? Authorizing Provider  acetaminophen (TYLENOL) 500 MG tablet Take 500 mg by mouth daily as needed (pain).    [provider]  folic acid (FOLVITE) 1 MG tablet  Take 1 tablet (1 mg total) by mouth daily. 05/25/22   Rolly Salter, MD  gabapentin (NEURONTIN) 300 MG capsule Take 1 capsule (300 mg total) by mouth 2 (two) times daily. 05/25/22 05/25/23  Rolly Salter, MD  levETIRAcetam (KEPPRA) 500 MG tablet Take 1 tablet (500 mg total) by mouth 2 (two) times daily. 05/25/22   Rolly Salter, MD  Multiple Vitamin (ONE-A-DAY MENS PO) Take 1 tablet by mouth daily.    [provider]  thiamine 100 MG tablet Take 1 tablet (100 mg total) by mouth daily. 05/25/22   Rolly Salter, MD                                                                                                                                    Past Surgical History History reviewed. No pertinent surgical history. Family History Family History  Problem Relation Age of Onset   Cancer Mother        lung   Hypertension Father     Social History Social History  Tobacco Use   Smoking status: Former   Smokeless tobacco: Current  Substance Use Topics   Alcohol use: Yes    Alcohol/week: 70.0 standard drinks of alcohol    Types: 70 Shots of liquor per week   Drug use: Not Currently   Allergies Patient has no known allergies.  Review of Systems Review of Systems  All other systems reviewed and are negative.   Physical Exam Vital Signs  I have reviewed the triage vital signs BP (!) 92/53   Pulse 88   Resp 17   SpO2 93%  Physical Exam Vitals and nursing note reviewed.  Constitutional:      General: He is not in acute distress.    Appearance: Normal appearance.  HENT:     Mouth/Throat:     Mouth: Mucous membranes are moist.  Eyes:     Conjunctiva/sclera: Conjunctivae normal.  Cardiovascular:     Rate and Rhythm: Normal rate and regular rhythm.  Pulmonary:     Effort: Pulmonary effort is normal. No respiratory distress.     Breath sounds: Normal breath sounds.  Abdominal:     General: Abdomen is flat.     Palpations: Abdomen is soft.     Tenderness: There is no  abdominal tenderness.  Musculoskeletal:     Right lower leg: No edema.     Left lower leg: No edema.  Skin:    General: Skin is warm and dry.     Capillary Refill: Capillary refill takes less than 2 seconds.  Neurological:     Mental Status: He is alert and oriented to person, place, and time. Mental status is at baseline.     Comments: Mild tremor  Psychiatric:        Mood and Affect: Mood normal.        Behavior: Behavior normal.     ED Results and Treatments Labs (all labs ordered are listed, but only abnormal results are displayed) Labs Reviewed  CBC WITH DIFFERENTIAL/PLATELET - Abnormal; Notable for the following components:      Result Value   RBC 4.10 (*)    MCH 34.4 (*)    All other components within normal limits  ETHANOL - Abnormal; Notable for the following components:   Alcohol, Ethyl (B) 216 (*)    All other components within normal limits  PROTIME-INR  LACTIC ACID, PLASMA  LACTIC ACID, PLASMA  URINALYSIS, ROUTINE W REFLEX MICROSCOPIC  COMPREHENSIVE METABOLIC PANEL  CBG MONITORING, ED                                                                                                                          Radiology No results found.  Pertinent labs & imaging results that were available during my care of the patient were reviewed by me and considered in my medical decision making (see MDM for details).  Medications Ordered in ED Medications  LORazepam (ATIVAN) tablet 1-4 mg (has no administration in time range)  Or  LORazepam (ATIVAN) injection 1-4 mg (has no administration in time range)  thiamine (VITAMIN B1) tablet 100 mg ( Oral See Alternative 08/30/22 1657)    Or  thiamine (VITAMIN B1) injection 100 mg (100 mg Intravenous Given 08/30/22 1657)  folic acid (FOLVITE) tablet 1 mg (has no administration in time range)  multivitamin with minerals tablet 1 tablet (has no administration in time range)  LORazepam (ATIVAN) injection 2 mg (2 mg Intravenous Given  08/30/22 1547)  ondansetron (ZOFRAN) injection 4 mg (4 mg Intravenous Given 08/30/22 1547)                                                                                                                                     Procedures Procedures  (including critical care time)  Medical Decision Making / ED Course   MDM:  36 year old presenting with likely alcohol withdrawal seizure.  Patient had 1 witnessed seizure in the emergency department.  Aborted on its own and received 2 mg of Ativan afterwards.  Given ability of head trauma will obtain CT head.  Will obtain electrolytes to look for alternative causes of seizure.  Will start on CIWA protocol.  Given multiple seizures patient may need admission.  Clinical Course as of 08/30/22 1702  Mon Aug 30, 2022  1626 Comprehensive metabolic panel [WS]    Clinical Course User Index [WS] Lonell Grandchild, MD     Additional history obtained: -Additional history obtained from EMS -External records from outside source obtained and reviewed including: Chart review including previous notes, labs, imaging, consultation notes   Lab Tests: -I ordered, reviewed, and interpreted labs.   The pertinent results include:   Labs Reviewed  CBC WITH DIFFERENTIAL/PLATELET - Abnormal; Notable for the following components:      Result Value   RBC 4.10 (*)    MCH 34.4 (*)    All other components within normal limits  ETHANOL - Abnormal; Notable for the following components:   Alcohol, Ethyl (B) 216 (*)    All other components within normal limits  PROTIME-INR  LACTIC ACID, PLASMA  LACTIC ACID, PLASMA  URINALYSIS, ROUTINE W REFLEX MICROSCOPIC  COMPREHENSIVE METABOLIC PANEL  CBG MONITORING, ED      Medicines ordered and prescription drug management: Meds ordered this encounter  Medications   DISCONTD: PHENObarbital (LUMINAL) injection 260 mg   LORazepam (ATIVAN) injection 2 mg   ondansetron (ZOFRAN) 4 MG/2ML injection    Cochrane,  Mykenzie E: cabinet override   LORazepam (ATIVAN) 2 MG/ML injection    Cochrane, Mykenzie E: cabinet override   ondansetron (ZOFRAN) injection 4 mg   DISCONTD: PHENObarbital (LUMINAL) 260 mg in sodium chloride 0.9 % 100 mL IVPB   OR Linked Order Group    LORazepam (ATIVAN) tablet 1-4 mg     Order Specific Question:   CIWA-AR < 5 =     Answer:   0 mg  Order Specific Question:   CIWA-AR 5 -10 =     Answer:   1 mg     Order Specific Question:   CIWA-AR 11 -15 =     Answer:   2 mg     Order Specific Question:   CIWA-AR 16 -20 =     Answer:   3 mg     Order Specific Question:   CIWA-AR 16 -20 =     Answer:   Recheck CIWA-AR in 1 hour; if > 20 notify MD     Order Specific Question:   CIWA-AR > 20 =     Answer:   4 mg     Order Specific Question:   CIWA-AR > 20 =     Answer:   Call Rapid Response    LORazepam (ATIVAN) injection 1-4 mg     Order Specific Question:   CIWA-AR < 5 =     Answer:   0 mg     Order Specific Question:   CIWA-AR 5 -10 =     Answer:   1 mg     Order Specific Question:   CIWA-AR 11 -15 =     Answer:   2 mg     Order Specific Question:   CIWA-AR 16 -20 =     Answer:   3 mg     Order Specific Question:   CIWA-AR 16 -20 =     Answer:   Recheck CIWA-AR in 1 hour; if > 20 notify MD     Order Specific Question:   CIWA-AR > 20 =     Answer:   4 mg     Order Specific Question:   CIWA-AR > 20 =     Answer:   Call Rapid Response   OR Linked Order Group    thiamine (VITAMIN B1) tablet 100 mg    thiamine (VITAMIN B1) injection 100 mg   folic acid (FOLVITE) tablet 1 mg   multivitamin with minerals tablet 1 tablet    -I have reviewed the patients home medicines and have made adjustments as needed    Cardiac Monitoring: The patient was maintained on a cardiac monitor.  I personally viewed and interpreted the cardiac monitored which showed an underlying rhythm of: NSR  Social Determinants of Health:  Factors impacting patients care include: alcohol  use   Reevaluation: After the interventions noted above, I reevaluated the patient and found that they have improved  Co morbidities that complicate the patient evaluation  Past Medical History:  Diagnosis Date   Eating disorder    history of bulemia   HYPERTENSION 06/25/2010   Seizure (HCC)       Dispostion: Discharge    Final Clinical Impression(s) / ED Diagnoses Final diagnoses:  Alcohol withdrawal seizure without complication (HCC)     This chart was dictated using voice recognition software.  Despite best efforts to proofread,  errors can occur which can change the documentation meaning.    Lonell Grandchild, MD 08/30/22 [phone]

## 2022-08-30 NOTE — H&P (Signed)
History and Physical    Adam [name] [employer]. CWU:[id number] DOB: Jul 16, 1986 DOA: 08/30/2022  PCP: Patient, No Pcp Per   Patient coming from: Home   Chief Complaint: Alcohol problem, seizures   HPI: Adam [name]. is a 36 y.o. male with medical history significant for hypertension, alcohol abuse, and seizures, now presenting to the emergency department with witnessed seizures.  Patient reported that he initially drove himself to the hospital to seek help with quitting alcohol, but had a transient loss of consciousness while walking down the stairs from the parking garage and was told by bystanders that he appeared to have had a seizure.  He then had another witnessed episode of generalized shaking with EMS that aborted on its own.  Reports that he drinks 750 mL of liquor daily with his last drink last night.  He reported desire to achieve abstinence.  He has history of seizure that were thought to possibly be from alcohol withdrawal, though his ethanol level was elevated at the time, leading neurology to consider alcohol induced seizures or underlying epilepsy and started him on Keppra.  He has not been taking the Keppra.   ED Course: Upon arrival to the ED, patient is found to be afebrile and saturating well with transient tachycardia and systolic blood pressures in the 90s to 120s.  Blood work notable for creatinine 1.44, ethanol 216, and lactic acid 3.5.  No acute intracranial abnormality noted on head CT.  He was treated with 2 doses of IV Ativan and 1000 mg IV Keppra in the ED.  Review of Systems:  All other systems reviewed and apart from HPI, are negative.  Past Medical History:  Diagnosis Date   Eating disorder    history of bulemia   HYPERTENSION 06/25/2010   Seizure ([place])     History reviewed. No pertinent surgical history.  Social History:   reports that he has quit smoking. He uses smokeless tobacco. He reports current alcohol use of about 70.0 standard  drinks of alcohol per week. He reports that he does not currently use drugs.  No Known Allergies  Family History  Problem Relation Age of Onset   Cancer Mother        lung   Hypertension Father      Prior to Admission medications   Medication Sig Start Date End Date Taking? Authorizing Provider  folic acid (FOLVITE) 1 MG tablet Take 1 tablet (1 mg total) by mouth daily. 05/25/22  Yes Lavina Hamman, MD  lisinopril (ZESTRIL) 30 MG tablet Take 30 mg by mouth daily. 06/27/22  Yes [provider]  thiamine 100 MG tablet Take 1 tablet (100 mg total) by mouth daily. 05/25/22  Yes Lavina Hamman, MD  gabapentin (NEURONTIN) 300 MG capsule Take 1 capsule (300 mg total) by mouth 2 (two) times daily. Patient not taking: Reported on 08/30/2022 05/25/22 05/25/23  Lavina Hamman, MD  levETIRAcetam (KEPPRA) 500 MG tablet Take 1 tablet (500 mg total) by mouth 2 (two) times daily. Patient not taking: Reported on 08/30/2022 05/25/22   Lavina Hamman, MD    Physical Exam: Vitals:   08/30/22 1830 08/30/22 1845 08/30/22 1925 08/30/22 1930  BP: (!) 96/57 (!) 93/57 112/70 112/73  Pulse: 82 80 99 (!) 102  Resp: _0 Temp:      TempSrc:      SpO2: 94% 94%  96%    Constitutional: NAD, no diaphoresis or pallor   Eyes: PERTLA, lids and  conjunctivae normal ENMT: Mucous membranes are moist. Posterior pharynx clear of any exudate or lesions.   Neck: supple, no masses  Respiratory: no wheezing, no crackles. No accessory muscle use.  Cardiovascular: S1 & S2 heard, regular rate and rhythm. No extremity edema.   Abdomen: No distension, no tenderness, soft. Bowel sounds active.  Musculoskeletal: no clubbing / cyanosis. No joint deformity upper and lower extremities.   Skin: no significant rashes, lesions, ulcers. Warm, dry, well-perfused. Neurologic: CN 2-12 grossly intact. Moving all extremities. Alert and oriented.  Psychiatric: Calm. Cooperative.    Labs and Imaging on Admission: I have personally  reviewed following labs and imaging studies  CBC: Recent Labs  Lab 08/30/22 1506  WBC 5.9  NEUTROABS 4.2  HGB 14.1  HCT 40.3  MCV 98.3  PLT 329   Basic Metabolic Panel: Recent Labs  Lab 08/30/22 1704  NA 135  K 4.6  CL 98  CO2 21*  GLUCOSE 84  BUN 28*  CREATININE 1.44*  CALCIUM 9.2   GFR: CrCl cannot be calculated (Unknown ideal weight.). Liver Function Tests: Recent Labs  Lab 08/30/22 1704  AST 77*  ALT 79*  ALKPHOS 53  BILITOT 0.6  PROT 6.6  ALBUMIN 4.2   No results for input(s): "LIPASE", "AMYLASE" in the last 168 hours. No results for input(s): "AMMONIA" in the last 168 hours. Coagulation Profile: Recent Labs  Lab 08/30/22 1506  INR 0.9   Cardiac Enzymes: No results for input(s): "CKTOTAL", "CKMB", "CKMBINDEX", "TROPONINI" in the last 168 hours. BNP (last 3 results) No results for input(s): "PROBNP" in the last 8760 hours. HbA1C: No results for input(s): "HGBA1C" in the last 72 hours. CBG: Recent Labs  Lab 08/30/22 1458  GLUCAP 94   Lipid Profile: No results for input(s): "CHOL", "HDL", "LDLCALC", "TRIG", "CHOLHDL", "LDLDIRECT" in the last 72 hours. Thyroid Function Tests: No results for input(s): "TSH", "T4TOTAL", "FREET4", "T3FREE", "THYROIDAB" in the last 72 hours. Anemia Panel: No results for input(s): "VITAMINB12", "FOLATE", "FERRITIN", "TIBC", "IRON", "RETICCTPCT" in the last 72 hours. Urine analysis:    Component Value Date/Time   COLORURINE YELLOW 08/30/2022 [place] 08/30/2022 1744   LABSPEC 1.018 08/30/2022 1744   PHURINE 6.0 08/30/2022 1744   GLUCOSEU 50 (A) 08/30/2022 1744   HGBUR NEGATIVE 08/30/2022 1744   BILIRUBINUR NEGATIVE 08/30/2022 1744   KETONESUR NEGATIVE 08/30/2022 1744   PROTEINUR NEGATIVE 08/30/2022 1744   NITRITE NEGATIVE 08/30/2022 1744   LEUKOCYTESUR NEGATIVE 08/30/2022 1744   Sepsis Labs: _0 (procalcitonin:4,lacticidven:4) )No results found for this or any previous visit (from the  past 240 hour(s)).   Radiological Exams on Admission: CT Head Wo Contrast  Result Date: 08/30/2022 CLINICAL DATA:  Seizure, new-onset, no history of trauma EXAM: CT HEAD WITHOUT CONTRAST TECHNIQUE: Contiguous axial images were obtained from the base of the skull through the vertex without intravenous contrast. RADIATION DOSE REDUCTION: This exam was performed according to the departmental dose-optimization program which includes automated exposure control, adjustment of the mA and/or kV according to patient size and/or use of iterative reconstruction technique. COMPARISON:  Head CT 05/24/2022, brain MRI 04/25/2022 FINDINGS: Brain: Stable mild but age advanced atrophy. No intracranial hemorrhage, mass effect, or midline shift. No hydrocephalus. The basilar cisterns are patent. No evidence of territorial infarct or acute ischemia. No extra-axial or intracranial fluid collection. Vascular: No hyperdense vessel or unexpected calcification. Skull: No fracture or focal lesion. Sinuses/Orbits: Paranasal sinuses and mastoid air cells are clear. The visualized orbits are unremarkable. Other: None. IMPRESSION: 1.  No acute intracranial abnormality. 2. Stable mild but age advanced atrophy. Electronically Signed   By: Keith Rake M.D.   On: 08/30/2022 19:14    Assessment/Plan   1. Seizures  - Presents with witnessed generalized seizures  - No acute findings on head CT, no infectious s/s, and no significant electrolyte disturbance  - He has hx seizures that may have been d/t EtOH withdrawal but had elevated EtOH level at the time leading neurology to consider alcohol-induced seizures or underlying epilepsy and start him on Keppra  - Treated in ED with multiple doses IV Ativan and 1000 mg IV Keppra  - Continue to treat withdrawal with Ativan, resume Keppra 500 mg BID, seizure precautions, encourage alcohol avoidance   2. Alcohol abuse  - Reports consuming 750 mL of liquor daily with last drink last night  -  He reports wanting to achieve abstinence  - He has nausea, tremor, and sweats suggestive of withdrawal despite EtOH level of 216 in ED  - Continue CIWA scoring, treat withdrawal with Ativan, supplement vitamins and minerals, consult TOC for available resources to help him abstain    3. Hypertension    - Hold lisinopril initially in light of increased creatinine and low-normal BP in ED    4. Elevated transaminases  - AST 77 and ALT 79 on admission with normal alk phos and bilirubin  - Recent RUQ [place] suggestive of hepatic steatosis  - Likely from fatty liver/EtOH abuse, can trend, encourage alcohol avoidance   5. Lactic acidosis  - Secondary to seizure; he is perfusing well with no infectious s/s   DVT prophylaxis: Lovenox  Code Status: Full  Level of Care: Level of care: Progressive Family Communication: none present  Disposition Plan:  Patient is from: home  Anticipated d/c is to: home  Anticipated d/c date is: Possibly as early as 10/10 or 09/01/22  Patient currently: Seizure control, EEG, transition to oral medications   Consults called: none  Admission status: Observation     Vianne Bulls, MD [facility]  08/30/2022, 8:07 PM

## 2022-08-31 LAB — COMPREHENSIVE METABOLIC PANEL
ALT: 63 U/L — ABNORMAL HIGH (ref 0–44)
AST: 58 U/L — ABNORMAL HIGH (ref 15–41)
Albumin: 3.4 g/dL — ABNORMAL LOW (ref 3.5–5.0)
Alkaline Phosphatase: 43 U/L (ref 38–126)
Anion gap: 10 (ref 5–15)
BUN: 23 mg/dL — ABNORMAL HIGH (ref 6–20)
CO2: 22 mmol/L (ref 22–32)
Calcium: 8.7 mg/dL — ABNORMAL LOW (ref 8.9–10.3)
Chloride: 103 mmol/L (ref 98–111)
Creatinine, Ser: 1.29 mg/dL — ABNORMAL HIGH (ref 0.61–1.24)
GFR, Estimated: >60 mL/min (ref >60)
Glucose, Bld: 101 mg/dL — ABNORMAL HIGH (ref 70–99)
Potassium: 3.7 mmol/L (ref 3.5–5.1)
Sodium: 135 mmol/L (ref 135–145)
Total Bilirubin: 0.8 mg/dL (ref 0.3–1.2)
Total Protein: 5.6 g/dL — ABNORMAL LOW (ref 6.5–8.1)

## 2022-08-31 LAB — CBC
HCT: 32.6 % — ABNORMAL LOW (ref 39.0–52.0)
Hemoglobin: 11.2 g/dL — ABNORMAL LOW (ref 13.0–17.0)
MCH: 33.6 pg (ref 26.0–34.0)
MCHC: 34.4 g/dL (ref 30.0–36.0)
MCV: 97.9 fL (ref 80.0–100.0)
Platelets: 193 10*3/uL (ref 150–400)
RBC: 3.33 MIL/uL — ABNORMAL LOW (ref 4.22–5.81)
RDW: 12.4 % (ref 11.5–15.5)
WBC: 3.3 10*3/uL — ABNORMAL LOW (ref 4.0–10.5)
nRBC: 0 % (ref 0.0–0.2)

## 2022-08-31 LAB — LACTIC ACID, PLASMA: Lactic Acid, Venous: 2.3 mmol/L (ref 0.5–1.9)

## 2022-08-31 LAB — MAGNESIUM: Magnesium: 1.7 mg/dL (ref 1.7–2.4)

## 2022-08-31 LAB — SURGICAL PCR SCREEN
MRSA, PCR: POSITIVE — AB
Staphylococcus aureus: POSITIVE — AB

## 2022-08-31 MED ORDER — LACTATED RINGERS IV BOLUS
1000.0000 mL | Freq: Once | INTRAVENOUS | Status: AC
  Administered 2022-08-31: 1000 mL via INTRAVENOUS

## 2022-08-31 MED ORDER — SODIUM CHLORIDE 0.9 % IV SOLN: INTRAVENOUS | Status: DC

## 2022-08-31 MED ORDER — BACITRACIN-NEOMYCIN-POLYMYXIN OINTMENT TUBE
TOPICAL_OINTMENT | Freq: Every day | CUTANEOUS | Status: AC
  Filled 2022-08-31: qty 14

## 2022-08-31 MED ORDER — MUPIROCIN 2 % EX OINT
1.0000 | TOPICAL_OINTMENT | Freq: Two times a day (BID) | CUTANEOUS | Status: DC
  Administered 2022-08-31 – 2022-09-02 (×4): 1 via NASAL
  Filled 2022-08-31 (×2): qty 22

## 2022-08-31 MED ORDER — CHLORHEXIDINE GLUCONATE CLOTH 2 % EX PADS: 6.0000 | MEDICATED_PAD | Freq: Every day | CUTANEOUS | Status: DC

## 2022-08-31 MED ORDER — CHLORDIAZEPOXIDE HCL 5 MG PO CAPS: 15.0000 mg | ORAL_CAPSULE | Freq: Three times a day (TID) | ORAL | Status: DC

## 2022-08-31 MED ORDER — CHLORDIAZEPOXIDE HCL 5 MG PO CAPS
15.0000 mg | ORAL_CAPSULE | Freq: Three times a day (TID) | ORAL | Status: DC
  Administered 2022-08-31 – 2022-09-01 (×6): 15 mg via ORAL
  Filled 2022-08-31 (×6): qty 3

## 2022-08-31 MED ORDER — MAGNESIUM SULFATE 2 GM/50ML IV SOLN
2.0000 g | Freq: Once | INTRAVENOUS | Status: AC
  Administered 2022-08-31: 2 g via INTRAVENOUS
  Filled 2022-08-31: qty 50

## 2022-08-31 NOTE — ED Notes (Signed)
ED TO INPATIENT HANDOFF REPORT  ED Nurse Name and Phone #: Josh   S Name/Age/Gender Adam Peoples Tocci Jr. 36 y.o. male Room/Bed: 005C/005C  Code Status   Code Status: Full Code  Home/SNF/Other Home Patient oriented to: self, place, time, and situation Is this baseline? Yes      Chief Complaint Seizures (HCC) [R56.9]  Triage Note Pt BIB [facility] for seizure, pt parked at [facility] parking deck, was coming here, had a seizure while walking down stairs, R ankle pain with standing and pain to R side of ribs. Able to call on his own. Had 1 episode vomiting. Also had 1 witnessed seizure with EMS. 20g R AC. Given 4mg  zofran, 50 mL NS. Pt endorses headache and seeing spots. Hx of seizures but did not follow up with neurologist, does not take medications. Hx of ETOH withdrawal seizures, states he has been drinking a fifth a day recently. Last drink was around midnight last PM. 140/90, HR 96, SpO2 97%, CBG 89.    Allergies No Known Allergies  Level of Care/Admitting Diagnosis ED Disposition     ED Disposition  Admit   Condition  --   Comment  Hospital Area: [facility] [[id number]]  Level of Care: Progressive [102]  Admit to Progressive based on following criteria: NEUROLOGICAL AND NEUROSURGICAL complex patients with significant risk of instability, who do not meet ICU criteria, yet require close observation or frequent assessment (< / = every 2 - 4 hours) with medical / nursing intervention.  May admit patient to [facility] or [facility] if equivalent level of care is available:: Yes  Covid Evaluation: Asymptomatic - no recent exposure (last 10 days) testing not required  Diagnosis: Seizures [facility]) [[id number]]  Admitting Physician: [facility] Briscoe Deutscher  Attending Physician: [id number]] [phone]  Certification:: I certify this patient will need inpatient services for at least 2 midnights  Estimated Length of Stay: 2          B Medical/Surgery History Past  Medical History:  Diagnosis Date   Eating disorder    history of bulemia   HYPERTENSION 06/25/2010   Seizure (HCC)    History reviewed. No pertinent surgical history.   A IV Location/Drains/Wounds Patient Lines/Drains/Airways Status     Active Line/Drains/Airways     Name Placement date Placement time Site Days   Peripheral IV 08/30/22 20 G Left Antecubital 08/30/22  1511  Antecubital  1   Wound / Incision (Open or Dehisced) 04/28/19 Laceration Back Posterior;Mid 04/28/19  0815  Back  1221   Wound / Incision (Open or Dehisced) 04/28/19 Laceration Back Mid;Posterior 04/28/19  0815  Back  1221            Intake/Output Last 24 hours  Intake/Output Summary (Last 24 hours) at 08/31/2022 1325 Last data filed at 08/31/2022 0728 Gross per 24 hour  Intake 2096.07 ml  Output 450 ml  Net 1646.07 ml    Labs/Imaging Results for orders placed or performed during the hospital encounter of 08/30/22 (from the past 48 hour(s))  CBG monitoring, ED     Status: None   Collection Time: 08/30/22  2:58 PM  Result Value Ref Range   Glucose-Capillary 94 70 - 99 mg/dL    Comment: Glucose reference range applies only to samples taken after fasting for at least 8 hours.   Comment 1 Document in Chart   CBC with Differential     Status: Abnormal   Collection Time: 08/30/22  3:06 PM  Result Value Ref Range   WBC 5.9 4.0 - 10.5 K/uL   RBC 4.10 (L) 4.22 - 5.81 MIL/uL   Hemoglobin 14.1 13.0 - 17.0 g/dL   HCT 44.8 18.5 - 63.1 %   MCV 98.3 80.0 - 100.0 fL   MCH 34.4 (H) 26.0 - 34.0 pg   MCHC 35.0 30.0 - 36.0 g/dL   RDW 49.7 02.6 - 37.8 %   Platelets 263 150 - 400 K/uL   nRBC 0.0 0.0 - 0.2 %   Neutrophils Relative % 71 %   Neutro Abs 4.2 1.7 - 7.7 K/uL   Lymphocytes Relative 20 %   Lymphs Abs 1.2 0.7 - 4.0 K/uL   Monocytes Relative 6 %   Monocytes Absolute 0.4 0.1 - 1.0 K/uL   Eosinophils Relative 2 %   Eosinophils Absolute 0.1 0.0 - 0.5 K/uL   Basophils Relative 1 %   Basophils Absolute  0.1 0.0 - 0.1 K/uL   Immature Granulocytes 0 %   Abs Immature Granulocytes 0.02 0.00 - 0.07 K/uL    Comment: Performed at [facility], [address] [place], [place] [address]  Ethanol     Status: Abnormal   Collection Time: 08/30/22  3:06 PM  Result Value Ref Range   Alcohol, Ethyl (B) 216 (H) <10 mg/dL    Comment: (NOTE) Lowest detectable limit for serum alcohol is 10 mg/dL.  For medical purposes only. Performed at [facility], [address], [place], [place] [address]   Protime-INR     Status: None   Collection Time: 08/30/22  3:06 PM  Result Value Ref Range   Prothrombin Time 11.9 11.4 - 15.2 seconds   INR 0.9 0.8 - 1.2    Comment: (NOTE) INR goal varies based on device and disease states. Performed at [facility], [address] [place], [place], [place] [address]   Lactic acid, plasma     Status: Abnormal   Collection Time: 08/30/22  3:50 PM  Result Value Ref Range   Lactic Acid, Venous 3.5 (HH) 0.5 - 1.9 mmol/L    Comment: CRITICAL RESULT CALLED TO, READ BACK BY AND VERIFIED WITH Lawson Radar RN 08/30/22 @1702  BY J. WHITE Performed at [facility], [address], [place], [place]   Comprehensive metabolic panel     Status: Abnormal   Collection Time: 08/30/22  5:04 PM  Result Value Ref Range   Sodium 135 135 - 145 mmol/L   Potassium 4.6 3.5 - 5.1 mmol/L   Chloride 98 98 - 111 mmol/L   CO2 21 (L) 22 - 32 mmol/L   Glucose, Bld 84 70 - 99 mg/dL    Comment: Glucose reference range applies only to samples taken after fasting for at least 8 hours.   BUN 28 (H) 6 - 20 mg/dL   Creatinine, Ser 10/30/22 (H) 0.61 - 1.24 mg/dL   Calcium 9.2 8.9 - 9.47 mg/dL   Total Protein 6.6 6.5 - 8.1 g/dL   Albumin 4.2 3.5 - 5.0 g/dL   AST 77 (H) 15 - 41 U/L   ALT 79 (H) 0 - 44 U/L   Alkaline Phosphatase 53 38 - 126 U/L   Total Bilirubin 0.6 0.3 - 1.2 mg/dL   GFR, Estimated 09.6 >28 mL/min    Comment: (NOTE) Calculated using the CKD-EPI Creatinine Equation  (2021)    Anion gap 16 (H) 5 - 15    Comment: Performed at [facility], [address] [place], [place] [address]  Urinalysis, Routine w reflex microscopic     Status: Abnormal   Collection Time: 08/30/22  5:44 PM  Result Value Ref Range   Color, Urine YELLOW YELLOW   APPearance CLEAR CLEAR   Specific Gravity, Urine 1.018 1.005 - 1.030   pH 6.0 5.0 - 8.0   Glucose, UA 50 (A) NEGATIVE mg/dL   Hgb urine dipstick NEGATIVE NEGATIVE   Bilirubin Urine NEGATIVE NEGATIVE   Ketones, ur NEGATIVE NEGATIVE mg/dL   Protein, ur NEGATIVE NEGATIVE mg/dL   Nitrite NEGATIVE NEGATIVE   Leukocytes,Ua NEGATIVE NEGATIVE    Comment: Performed at [facility], [address] [place], [place] [address]  Lactic acid, plasma     Status: Abnormal   Collection Time: 08/30/22  7:36 PM  Result Value Ref Range   Lactic Acid, Venous 2.5 (HH) 0.5 - 1.9 mmol/L    Comment: CRITICAL VALUE NOTED.  VALUE IS CONSISTENT WITH PREVIOUSLY REPORTED AND CALLED VALUE. Performed at [facility], [address] [place], [place] [address]   Comprehensive metabolic panel     Status: Abnormal   Collection Time: 08/31/22  4:00 AM  Result Value Ref Range   Sodium 135 135 - 145 mmol/L   Potassium 3.7 3.5 - 5.1 mmol/L   Chloride 103 98 - 111 mmol/L   CO2 22 22 - 32 mmol/L   Glucose, Bld 101 (H) 70 - 99 mg/dL    Comment: Glucose reference range applies only to samples taken after fasting for at least 8 hours.   BUN 23 (H) 6 - 20 mg/dL   Creatinine, Ser 3.71 (H) 0.61 - 1.24 mg/dL   Calcium 8.7 (L) 8.9 - 10.3 mg/dL   Total Protein 5.6 (L) 6.5 - 8.1 g/dL   Albumin 3.4 (L) 3.5 - 5.0 g/dL   AST 58 (H) 15 - 41 U/L   ALT 63 (H) 0 - 44 U/L   Alkaline Phosphatase 43 38 - 126 U/L   Total Bilirubin 0.8 0.3 - 1.2 mg/dL   GFR, Estimated >06 >26 mL/min    Comment: (NOTE) Calculated using the CKD-EPI Creatinine Equation (2021)    Anion gap 10 5 - 15    Comment: Performed at [facility], [address],  [place], [place] [address]  CBC     Status: Abnormal   Collection Time: 08/31/22  4:00 AM  Result Value Ref Range   WBC 3.3 (L) 4.0 - 10.5 K/uL   RBC 3.33 (L) 4.22 - 5.81 MIL/uL   Hemoglobin 11.2 (L) 13.0 - 17.0 g/dL   HCT 62.7 (L) 03.5 - 00.9 %   MCV 97.9 80.0 - 100.0 fL   MCH 33.6 26.0 - 34.0 pg   MCHC 34.4 30.0 - 36.0 g/dL   RDW 38.1 82.9 - 93.7 %   Platelets 193 150 - 400 K/uL   nRBC 0.0 0.0 - 0.2 %    Comment: Performed at [facility], [address] [place], [place] [address]  Lactic acid, plasma     Status: Abnormal   Collection Time: 08/31/22  4:00 AM  Result Value Ref Range   Lactic Acid, Venous 2.3 (HH) 0.5 - 1.9 mmol/L    Comment: CRITICAL VALUE NOTED.  VALUE IS CONSISTENT WITH PREVIOUSLY REPORTED AND CALLED VALUE. Performed at [facility], [address] [place], [place] [address]   Magnesium     Status: None   Collection Time: 08/31/22  4:00 AM  Result Value Ref Range   Magnesium 1.7 1.7 - 2.4 mg/dL    Comment: Performed at [facility], [place] [address], [place], [place] [address]   CT Head Wo Contrast  Result Date: 08/30/2022 CLINICAL DATA:  Seizure, new-onset, no history of trauma EXAM: CT HEAD WITHOUT CONTRAST TECHNIQUE: Contiguous axial images were obtained from the base of the skull through the vertex without intravenous contrast. RADIATION DOSE REDUCTION: This exam was performed according to the departmental dose-optimization program which includes automated exposure control, adjustment of the mA and/or kV according to patient size and/or use of iterative reconstruction technique. COMPARISON:  Head CT 05/24/2022, brain MRI 04/25/2022 FINDINGS: Brain: Stable mild but age advanced atrophy. No intracranial hemorrhage, mass effect, or midline shift. No hydrocephalus. The basilar cisterns are patent. No evidence of territorial infarct or acute ischemia. No extra-axial or intracranial fluid collection. Vascular: No hyperdense vessel or unexpected  calcification. Skull: No fracture or focal lesion. Sinuses/Orbits: Paranasal sinuses and mastoid air cells are clear. The visualized orbits are unremarkable. Other: None. IMPRESSION: 1. No acute intracranial abnormality. 2. Stable mild but age advanced atrophy. Electronically Signed   By: Keith Rake M.D.   On: 08/30/2022 19:14    Pending Labs Unresulted Labs (From admission, onward)     Start     Ordered   09/06/22 0500  Creatinine, serum  (enoxaparin (LOVENOX)    CrCl >/= 30 ml/min)  Weekly,   R     Comments: while on enoxaparin therapy    08/30/22 2006   09/01/22 0500  Magnesium  Daily at 5am,   R     Question:  Specimen collection method  Answer:  Lab=Lab collect   08/31/22 0606   09/01/22 0500  Comprehensive metabolic panel  Daily at 5am,   R     Question:  Specimen collection method  Answer:  Lab=Lab collect   08/31/22 0606   09/01/22 0500  CBC with Differential/Platelet  Daily at 5am,   R     Question:  Specimen collection method  Answer:  Lab=Lab collect   08/31/22 0606   09/01/22 0500  Brain natriuretic peptide  Daily at 5am,   R     Question:  Specimen collection method  Answer:  Lab=Lab collect   08/31/22 0606   09/01/22 0500  Phosphorus  Tomorrow morning,   R        08/31/22 0606            Vitals/Pain Today's Vitals   08/31/22 0800 08/31/22 1037 08/31/22 1050 08/31/22 1055  BP: 110/70 108/68 108/68   Pulse: 81 90 93   Resp: 15  16   Temp:    98.2 F (36.8 C)  TempSrc:    Oral  SpO2: 97%  95%   PainSc:        Isolation Precautions No active isolations  Medications Medications  thiamine (VITAMIN B1) tablet 100 mg (100 mg Oral Given 08/31/22 1051)    Or  thiamine (VITAMIN B1) injection 100 mg ( Intravenous See Alternative 54/27/06 2376)  folic acid (FOLVITE) tablet 1 mg (1 mg Oral Given 08/31/22 1051)  multivitamin with minerals tablet 1 tablet (1 tablet Oral Given 08/31/22 1051)  levETIRAcetam (KEPPRA) tablet 500 mg (500 mg Oral Given 08/31/22  1051)  LORazepam (ATIVAN) tablet 1-4 mg ( Oral See Alternative 08/31/22 1050)    Or  LORazepam (ATIVAN) injection 1-4 mg (1 mg Intravenous Given 08/31/22 1050)  LORazepam (ATIVAN) injection 0-4 mg ( Intravenous Not  Given 08/31/22 0815)    Followed by  LORazepam (ATIVAN) injection 0-4 mg (has no administration in time range)  enoxaparin (LOVENOX) injection 40 mg (40 mg Subcutaneous Given 08/30/22 2108)  sodium chloride flush (NS) 0.9 % injection 3 mL (3 mLs Intravenous Not Given 08/31/22 1051)  acetaminophen (TYLENOL) tablet 650 mg (650 mg Oral Given 08/31/22 1051)    Or  acetaminophen (TYLENOL) suppository 650 mg ( Rectal See Alternative 08/31/22 1051)  senna-docusate (Senokot-S) tablet 1 tablet (has no administration in time range)  ondansetron (ZOFRAN) tablet 4 mg (has no administration in time range)    Or  ondansetron (ZOFRAN) injection 4 mg (has no administration in time range)  0.9 %  sodium chloride infusion ( Intravenous New Bag/Given 08/31/22 0630)  chlordiazePOXIDE (LIBRIUM) capsule 15 mg (15 mg Oral Given 08/31/22 1050)  magnesium sulfate IVPB 2 g 50 mL (has no administration in time range)  LORazepam (ATIVAN) injection 2 mg (2 mg Intravenous Given 08/30/22 1547)  ondansetron (ZOFRAN) injection 4 mg (4 mg Intravenous Given 08/30/22 1547)  levETIRAcetam (KEPPRA) IVPB 1000 mg/100 mL premix (0 mg Intravenous Stopped 08/30/22 2154)  sodium chloride 0.9 % bolus 1,000 mL (0 mLs Intravenous Stopped 08/31/22 0009)  lactated ringers bolus 1,000 mL (0 mLs Intravenous Stopped 08/31/22 1323)    Mobility walks Low fall risk   Focused Assessments     R Recommendations: See Admitting Provider Note  Report given to:   Additional Notes:

## 2022-08-31 NOTE — ED Notes (Signed)
Pt is ambulating to restroom

## 2022-08-31 NOTE — ED Notes (Signed)
Pt reporting decreased nausea, given bagged lunch

## 2022-08-31 NOTE — Progress Notes (Signed)
PROGRESS NOTE                                                                                                                                                                                                             Patient Demographics:    Adam [name], is a 36 y.o. male, DOB - [birth year], OZD:[id number]  Outpatient Primary MD for the patient is Patient, No Pcp Per    LOS - 0  Admit date - 08/30/2022    Chief Complaint  Patient presents with   Seizures       Brief Narrative (HPI from H&P)   36 y.o. male with medical history significant for hypertension, alcohol abuse, and seizures, now presenting to the emergency department with witnessed seizures.  He apparently was driving himself to the hospital to get detoxed but while in the hospital he had to episodes of witnessed seizures and was admitted for further care.    Subjective:    Adam [name] today has, No headache, No chest pain, No abdominal pain - No Nausea, No new weakness tingling or numbness, no SOB.   Assessment  & Plan :   Alcohol intoxication, alcohol abuse which is ongoing history of epilepsy/seizures with noncompliance with Keppra, with breakthrough seizures . he had 2 episodes of witnessed seizures, alcohol level was 216, he is also noncompliant with Keppra.  CT head is stable, no focal deficits or headache, at this point Keppra has been resumed.  As needed Ativan for breakthrough seizure.  This clearly is due to combination of alcohol intoxication and Keppra noncompliance.  He has been counseled to quit alcohol and be compliant with Keppra.  Continue to monitor.  Outpatient neurology follow-up postdischarge.  Ongoing alcohol abuse in DTs.  Placed on scheduled Librium along with CIWA protocol.  Counseled to quit.  Hypertension.  Currently dehydrated with low blood pressure, IV fluid bolus and maintenance.  Asymptomatic transaminitis.  Due to alcohol  abuse.  Had recent right upper quadrant ultrasound suggesting fatty liver, discontinue alcohol outpatient follow-up with PCP for monitoring.  Seizure, dehydration and alcohol intoxication induced lactic acidosis.  No infection or sepsis, hydrate and monitor.       Condition - Fair  Family Communication  :  None  Code Status :  Full  Consults  :  None  PUD Prophylaxis :  Procedures  :     CT head.  Nonacute.      Disposition Plan  :    Status is: Observation  DVT Prophylaxis  :    enoxaparin (LOVENOX) injection 40 mg Start: 08/30/22 2015   Lab Results  Component Value Date   PLT 193 08/31/2022    Diet :  Diet Order             Diet regular Room service appropriate? Yes; Fluid consistency: Thin  Diet effective now                    Inpatient Medications  Scheduled Meds:  chlordiazePOXIDE  15 mg Oral TID   enoxaparin (LOVENOX) injection  40 mg Subcutaneous K24O   folic acid  1 mg Oral Daily   levETIRAcetam  500 mg Oral BID   LORazepam  0-4 mg Intravenous Q4H   Followed by   Derrill Memo ON 09/01/2022] LORazepam  0-4 mg Intravenous Q8H   multivitamin with minerals  1 tablet Oral Daily   sodium chloride flush  3 mL Intravenous Q12H   thiamine  100 mg Oral Daily   Or   thiamine  100 mg Intravenous Daily   Continuous Infusions:  sodium chloride 75 mL/hr at 08/31/22 0630   PRN Meds:.acetaminophen **OR** acetaminophen, LORazepam **OR** LORazepam, ondansetron **OR** ondansetron (ZOFRAN) IV, senna-docusate  Antibiotics  :    Anti-infectives (From admission, onward)    None         Objective:   Vitals:   08/31/22 0730 08/31/22 0736 08/31/22 0745 08/31/22 0800  BP: 110/71  115/68 110/70  Pulse: 72  76 81  Resp: 15  15 15   Temp:  98 F (36.7 C)    TempSrc:  Oral    SpO2: 98%  97% 97%    Wt Readings from Last 3 Encounters:  05/24/22 74.8 kg  04/24/22 74.3 kg  12/26/21 72.6 kg     Intake/Output Summary (Last 24 hours) at 08/31/2022  0951 Last data filed at 08/31/2022 0728 Gross per 24 hour  Intake 2096.07 ml  Output 450 ml  Net 1646.07 ml     Physical Exam  Awake Alert, No new F.N deficits, Normal affect [place].AT,PERRAL Supple Neck, No JVD,   Symmetrical Chest wall movement, Good air movement bilaterally, CTAB RRR,No Gallops,Rubs or new Murmurs,  +ve B.Sounds, Abd Soft, No tenderness,   No Cyanosis, Clubbing or edema     RN pressure injury documentation:      Data Review:    CBC Recent Labs  Lab 08/30/22 1506 08/31/22 0400  WBC 5.9 3.3*  HGB 14.1 11.2*  HCT 40.3 32.6*  PLT 263 193  MCV 98.3 97.9  MCH 34.4* 33.6  MCHC 35.0 34.4  RDW 12.3 12.4  LYMPHSABS 1.2  --   MONOABS 0.4  --   EOSABS 0.1  --   BASOSABS 0.1  --     Electrolytes Recent Labs  Lab 08/30/22 1506 08/30/22 1550 08/30/22 1704 08/30/22 1936 08/31/22 0400  NA  --   --  135  --  135  K  --   --  4.6  --  3.7  CL  --   --  98  --  103  CO2  --   --  21*  --  22  GLUCOSE  --   --  84  --  101*  BUN  --   --  28*  --  23*  CREATININE  --   --  1.44*  --  1.29*  CALCIUM  --   --  9.2  --  8.7*  AST  --   --  77*  --  58*  ALT  --   --  79*  --  63*  ALKPHOS  --   --  53  --  43  BILITOT  --   --  0.6  --  0.8  ALBUMIN  --   --  4.2  --  3.4*  LATICACIDVEN  --  3.5*  --  2.5* 2.3*  INR 0.9  --   --   --   --     ------------------------------------------------------------------------------------------------------------------ No results for input(s): "CHOL", "HDL", "LDLCALC", "TRIG", "CHOLHDL", "LDLDIRECT" in the last 72 hours.  No results found for: "HGBA1C"  No results for input(s): "TSH", "T4TOTAL", "T3FREE", "THYROIDAB" in the last 72 hours.  Invalid input(s): "FREET3" ------------------------------------------------------------------------------------------------------------------ ID Labs Recent Labs  Lab 08/30/22 1506 08/30/22 1550 08/30/22 1704 08/30/22 1936 08/31/22 0400  WBC 5.9  --   --   --  3.3*   PLT 263  --   --   --  193  LATICACIDVEN  --  3.5*  --  2.5* 2.3*  CREATININE  --   --  1.44*  --  1.29*   Cardiac Enzymes No results for input(s): "CKMB", "TROPONINI", "MYOGLOBIN" in the last 168 hours.  Invalid input(s): "CK"       Micro Results No results found for this or any previous visit (from the past 240 hour(s)).  Radiology Reports CT Head Wo Contrast  Result Date: 08/30/2022 CLINICAL DATA:  Seizure, new-onset, no history of trauma EXAM: CT HEAD WITHOUT CONTRAST TECHNIQUE: Contiguous axial images were obtained from the base of the skull through the vertex without intravenous contrast. RADIATION DOSE REDUCTION: This exam was performed according to the departmental dose-optimization program which includes automated exposure control, adjustment of the mA and/or kV according to patient size and/or use of iterative reconstruction technique. COMPARISON:  Head CT 05/24/2022, brain MRI 04/25/2022 FINDINGS: Brain: Stable mild but age advanced atrophy. No intracranial hemorrhage, mass effect, or midline shift. No hydrocephalus. The basilar cisterns are patent. No evidence of territorial infarct or acute ischemia. No extra-axial or intracranial fluid collection. Vascular: No hyperdense vessel or unexpected calcification. Skull: No fracture or focal lesion. Sinuses/Orbits: Paranasal sinuses and mastoid air cells are clear. The visualized orbits are unremarkable. Other: None. IMPRESSION: 1. No acute intracranial abnormality. 2. Stable mild but age advanced atrophy. Electronically Signed   By: Narda Rutherford M.D.   On: 08/30/2022 19:14      Signature  Susa Raring M.D on 08/31/2022 at 9:51 AM   -  To page go to [email or web address]

## 2022-08-31 NOTE — Evaluation (Signed)
Physical Therapy Evaluation Patient Details Name: Adam [name] [employer]. MRN: [id number] DOB: [birth year] Today's Date: 08/31/2022  History of Present Illness  Pt is a 36 y/o male admitted secondary to seizure like activity. PMH includes alcohol abuse, HTN, and seizures.  Clinical Impression  Pt admitted secondary to problem above with deficits below. Pt overall independent with mobility tasks. Mild wooziness reported, but did not seem to affect mobility. Educated about continuing mobility with mobility specialist team to maintain strength and endurance. No further acute skilled PT needs. Will sign off. If needs change, please re-consult.        Recommendations for follow up therapy are one component of a multi-disciplinary discharge planning process, led by the attending physician.  Recommendations may be updated based on patient status, additional functional criteria and insurance authorization.  Follow Up Recommendations No PT follow up      Assistance Recommended at Discharge PRN  Patient can return home with the following       Equipment Recommendations None recommended by PT  Recommendations for Other Services       Functional Status Assessment Patient has had a recent decline in their functional status and demonstrates the ability to make significant improvements in function in a reasonable and predictable amount of time.     Precautions / Restrictions Precautions Precautions: None Restrictions Weight Bearing Restrictions: No      Mobility  Bed Mobility Overal bed mobility: Independent                  Transfers Overall transfer level: Independent                      Ambulation/Gait Ambulation/Gait assistance: Independent Gait Distance (Feet): 40 Feet Assistive device: None Gait Pattern/deviations: WFL(Within Functional Limits) Gait velocity: Decreased     General Gait Details: Overall steady with mobility tasks within the room. No LOB  noted. Mild wooziness reported, but did not affect mobility.  Stairs            Wheelchair Mobility    Modified Rankin (Stroke Patients Only)       Balance Overall balance assessment: No apparent balance deficits (not formally assessed)                                           Pertinent Vitals/Pain Pain Assessment Pain Assessment: No/denies pain    Home Living Family/patient expects to be discharged to:: Private residence Living Arrangements: Spouse/significant other Available Help at Discharge: Family Type of Home: Apartment Home Access: Stairs to enter Entrance Stairs-Rails: Psychiatric nurse of Steps: 2 flight   Home Layout: One level Home Equipment: None      Prior Function Prior Level of Function : Independent/Modified Independent                     Hand Dominance        Extremity/Trunk Assessment   Upper Extremity Assessment Upper Extremity Assessment: Overall WFL for tasks assessed    Lower Extremity Assessment Lower Extremity Assessment: Overall WFL for tasks assessed    Cervical / Trunk Assessment Cervical / Trunk Assessment: Normal  Communication   Communication: No difficulties  Cognition Arousal/Alertness: Awake/alert Behavior During Therapy: WFL for tasks assessed/performed Overall Cognitive Status: Within Functional Limits for tasks assessed  General Comments      Exercises     Assessment/Plan    PT Assessment Patient does not need any further PT services  PT Problem List         PT Treatment Interventions      PT Goals (Current goals can be found in the Care Plan section)  Acute Rehab PT Goals Patient Stated Goal: to go home PT Goal Formulation: With patient Time For Goal Achievement: 08/31/22 Potential to Achieve Goals: Good    Frequency       Co-evaluation               AM-PAC PT "6 Clicks" Mobility   Outcome Measure Help needed turning from your back to your side while in a flat bed without using bedrails?: None Help needed moving from lying on your back to sitting on the side of a flat bed without using bedrails?: None Help needed moving to and from a bed to a chair (including a wheelchair)?: None Help needed standing up from a chair using your arms (e.g., wheelchair or bedside chair)?: None Help needed to walk in hospital room?: None Help needed climbing 3-5 steps with a railing? : None 6 Click Score: 24    End of Session   Activity Tolerance: Patient tolerated treatment well Patient left: in bed (on stretcher in ED with transport staff present) Nurse Communication: Mobility status PT Visit Diagnosis: Other symptoms and signs involving the nervous system (R29.898)    Time: 0354-6568 PT Time Calculation (min) (ACUTE ONLY): 10 min   Charges:   PT Evaluation $PT Eval Low Complexity: 1 Low          Cindee Salt, DPT  Acute Rehabilitation Services  Office: [phone]   Adam [name] 08/31/2022, 4:16 PM

## 2022-08-31 NOTE — Progress Notes (Signed)
Patient arrived to unit, seizure precautions in place. Will continue to monitor.

## 2022-09-01 LAB — CBC WITH DIFFERENTIAL/PLATELET
Abs Immature Granulocytes: 0 10*3/uL (ref 0.00–0.07)
Basophils Absolute: 0 10*3/uL (ref 0.0–0.1)
Basophils Relative: 1 %
Eosinophils Absolute: 0 10*3/uL (ref 0.0–0.5)
Eosinophils Relative: 1 %
HCT: 32 % — ABNORMAL LOW (ref 39.0–52.0)
Hemoglobin: 11.4 g/dL — ABNORMAL LOW (ref 13.0–17.0)
Immature Granulocytes: 0 %
Lymphocytes Relative: 39 %
Lymphs Abs: 1.3 10*3/uL (ref 0.7–4.0)
MCH: 34.1 pg — ABNORMAL HIGH (ref 26.0–34.0)
MCHC: 35.6 g/dL (ref 30.0–36.0)
MCV: 95.8 fL (ref 80.0–100.0)
Monocytes Absolute: 0.3 10*3/uL (ref 0.1–1.0)
Monocytes Relative: 9 %
Neutro Abs: 1.7 10*3/uL (ref 1.7–7.7)
Neutrophils Relative %: 50 %
Platelets: 192 10*3/uL (ref 150–400)
RBC: 3.34 MIL/uL — ABNORMAL LOW (ref 4.22–5.81)
RDW: 11.9 % (ref 11.5–15.5)
WBC: 3.4 10*3/uL — ABNORMAL LOW (ref 4.0–10.5)
nRBC: 0 % (ref 0.0–0.2)

## 2022-09-01 LAB — COMPREHENSIVE METABOLIC PANEL
ALT: 54 U/L — ABNORMAL HIGH (ref 0–44)
AST: 47 U/L — ABNORMAL HIGH (ref 15–41)
Albumin: 3.3 g/dL — ABNORMAL LOW (ref 3.5–5.0)
Alkaline Phosphatase: 42 U/L (ref 38–126)
Anion gap: 8 (ref 5–15)
BUN: 14 mg/dL (ref 6–20)
CO2: 25 mmol/L (ref 22–32)
Calcium: 8.4 mg/dL — ABNORMAL LOW (ref 8.9–10.3)
Chloride: 102 mmol/L (ref 98–111)
Creatinine, Ser: 1.14 mg/dL (ref 0.61–1.24)
GFR, Estimated: >60 mL/min (ref >60)
Glucose, Bld: 105 mg/dL — ABNORMAL HIGH (ref 70–99)
Potassium: 3.7 mmol/L (ref 3.5–5.1)
Sodium: 135 mmol/L (ref 135–145)
Total Bilirubin: 1 mg/dL (ref 0.3–1.2)
Total Protein: 5.4 g/dL — ABNORMAL LOW (ref 6.5–8.1)

## 2022-09-01 LAB — MAGNESIUM: Magnesium: 2.1 mg/dL (ref 1.7–2.4)

## 2022-09-01 LAB — BRAIN NATRIURETIC PEPTIDE: B Natriuretic Peptide: 109.9 pg/mL — ABNORMAL HIGH (ref 0.0–100.0)

## 2022-09-01 LAB — PHOSPHORUS: Phosphorus: 2.7 mg/dL (ref 2.5–4.6)

## 2022-09-01 MED ORDER — POTASSIUM CHLORIDE 2 MEQ/ML IV SOLN
INTRAVENOUS | Status: DC
  Filled 2022-09-01 (×2): qty 1000
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
## 2022-09-02 ENCOUNTER — Other Ambulatory Visit (HOSPITAL_COMMUNITY): Payer: Self-pay

## 2022-09-02 DIAGNOSIS — T23031A Burn of unspecified degree of multiple right fingers (nail), not including thumb, initial encounter: Secondary | ICD-10-CM | POA: Insufficient documentation

## 2022-09-02 LAB — CBC WITH DIFFERENTIAL/PLATELET
Abs Immature Granulocytes: 0.01 10*3/uL (ref 0.00–0.07)
Basophils Absolute: 0 10*3/uL (ref 0.0–0.1)
Basophils Relative: 1 %
Eosinophils Absolute: 0 10*3/uL (ref 0.0–0.5)
Eosinophils Relative: 1 %
HCT: 32.6 % — ABNORMAL LOW (ref 39.0–52.0)
Hemoglobin: 11.7 g/dL — ABNORMAL LOW (ref 13.0–17.0)
Immature Granulocytes: 0 %
Lymphocytes Relative: 31 %
Lymphs Abs: 1.2 10*3/uL (ref 0.7–4.0)
MCH: 34.4 pg — ABNORMAL HIGH (ref 26.0–34.0)
MCHC: 35.9 g/dL (ref 30.0–36.0)
MCV: 95.9 fL (ref 80.0–100.0)
Monocytes Absolute: 0.2 10*3/uL (ref 0.1–1.0)
Monocytes Relative: 5 %
Neutro Abs: 2.3 10*3/uL (ref 1.7–7.7)
Neutrophils Relative %: 62 %
Platelets: 162 10*3/uL (ref 150–400)
RBC: 3.4 MIL/uL — ABNORMAL LOW (ref 4.22–5.81)
RDW: 11.9 % (ref 11.5–15.5)
WBC: 3.7 10*3/uL — ABNORMAL LOW (ref 4.0–10.5)
nRBC: 0 % (ref 0.0–0.2)

## 2022-09-02 LAB — COMPREHENSIVE METABOLIC PANEL
ALT: 73 U/L — ABNORMAL HIGH (ref 0–44)
AST: 85 U/L — ABNORMAL HIGH (ref 15–41)
Albumin: 3.3 g/dL — ABNORMAL LOW (ref 3.5–5.0)
Alkaline Phosphatase: 38 U/L (ref 38–126)
Anion gap: 11 (ref 5–15)
BUN: 9 mg/dL (ref 6–20)
CO2: 24 mmol/L (ref 22–32)
Calcium: 9.4 mg/dL (ref 8.9–10.3)
Chloride: 105 mmol/L (ref 98–111)
Creatinine, Ser: 1.03 mg/dL (ref 0.61–1.24)
GFR, Estimated: >60 mL/min (ref >60)
Glucose, Bld: 82 mg/dL (ref 70–99)
Potassium: 4.1 mmol/L (ref 3.5–5.1)
Sodium: 140 mmol/L (ref 135–145)
Total Bilirubin: 1 mg/dL (ref 0.3–1.2)
Total Protein: 5.4 g/dL — ABNORMAL LOW (ref 6.5–8.1)

## 2022-09-02 LAB — MAGNESIUM: Magnesium: 2.1 mg/dL (ref 1.7–2.4)

## 2022-09-02 LAB — BRAIN NATRIURETIC PEPTIDE: B Natriuretic Peptide: 45.4 pg/mL (ref 0.0–100.0)

## 2022-09-02 MED ORDER — LEVETIRACETAM 500 MG PO TABS
500.0000 mg | ORAL_TABLET | Freq: Two times a day (BID) | ORAL | 0 refills | Status: DC
  Filled 2022-09-02: qty 60, 30d supply, fill #0

## 2022-09-02 MED ORDER — CHLORDIAZEPOXIDE HCL 5 MG PO CAPS
10.0000 mg | ORAL_CAPSULE | Freq: Two times a day (BID) | ORAL | Status: DC
  Administered 2022-09-02: 10 mg via ORAL
  Filled 2022-09-02: qty 2

## 2022-09-02 MED ORDER — GABAPENTIN 300 MG PO CAPS
300.0000 mg | ORAL_CAPSULE | Freq: Two times a day (BID) | ORAL | 0 refills | Status: DC
  Filled 2022-09-02: qty 60, 30d supply, fill #0

## 2022-09-02 MED ORDER — THIAMINE HCL 100 MG PO TABS
100.0000 mg | ORAL_TABLET | Freq: Every day | ORAL | 0 refills | Status: DC
  Filled 2022-09-02: qty 30, 30d supply, fill #0

## 2022-09-02 MED ORDER — NONFORMULARY OR COMPOUNDED ITEM: 0 refills | Status: DC

## 2022-09-02 MED ORDER — BACITRACIN-NEOMYCIN-POLYMYXIN OINTMENT TUBE
1.0000 | TOPICAL_OINTMENT | Freq: Three times a day (TID) | CUTANEOUS | 0 refills | Status: DC
  Filled 2022-09-02: qty 28.4, 10d supply, fill #0

## 2022-09-02 MED ORDER — FOLIC ACID 1 MG PO TABS
1.0000 mg | ORAL_TABLET | Freq: Every day | ORAL | 0 refills | Status: DC
  Filled 2022-09-02: qty 30, 30d supply, fill #0

## 2022-09-02 MED ORDER — LISINOPRIL 20 MG PO TABS
20.0000 mg | ORAL_TABLET | Freq: Every day | ORAL | 0 refills | Status: DC
  Filled 2022-09-02: qty 30, 30d supply, fill #0
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
## 2022-09-02 NOTE — Progress Notes (Signed)
Pt is very addiment in being discharged to. Stated, his son was in the hospital. MD aware.

## 2022-09-02 NOTE — Discharge Instructions (Signed)
Do not drive, operate heavy machinery, perform activities at heights, swimming or participation in water activities or provide baby sitting services until you have seen by  your Neurologist and advised to do so again.  Keep your hands clean and dry at all times.  Continue to apply antibiotic I went once or twice a day, cover with single layer of xeroform (non adherent dressing). Wrap with conform gauze (1" or 2") change every other day  Follow with Primary MD Patient, No Pcp Per in 7 days   Get CBC, CMP, Magnesium -  checked next visit within 1 week by Primary MD    Activity: As tolerated with Full fall precautions use walker/cane & assistance as needed  Disposition Home    Diet: Heart Healthy    Special Instructions: If you have smoked or chewed Tobacco  in the last 2 yrs please stop smoking, stop any regular Alcohol  and or any Recreational drug use.  On your next visit with your primary care physician please Get Medicines reviewed and adjusted.  Please request your Prim.MD to go over all Hospital Tests and Procedure/Radiological results at the follow up, please get all Hospital records sent to your Prim MD by signing hospital release before you go home.  If you experience worsening of your admission symptoms, develop shortness of breath, life threatening emergency, suicidal or homicidal thoughts you must seek medical attention immediately by calling 911 or calling your MD immediately  if symptoms less severe.  You Must read complete instructions/literature along with all the possible adverse reactions/side effects for all the Medicines you take and that have been prescribed to you. Take any new Medicines after you have completely understood and accpet all the possible adverse reactions/side effects.

## 2022-09-02 NOTE — Discharge Summary (Signed)
Adam [name] [employer]. EVO:[id number] DOB: February 09, 1986 DOA: 08/30/2022  PCP: Patient, No Pcp Per  Admit date: 08/30/2022  Discharge date: 09/02/2022  Admitted From: Home   Disposition:  Home   Recommendations for Outpatient Follow-up:   Follow up with PCP in 1-2 weeks  PCP Please obtain BMP/CBC, 2 view CXR in 1week,  (see Discharge instructions)   PCP Please follow up on the following pending results: Check CBC, CMP, magnesium in 7 to 10 days.  Monitor right hand wounds.  Outpatient neurology follow-up in 1 to 2 weeks.   Home Health: None   Equipment/Devices: None  Consultations: None  Discharge Condition: Stable    CODE STATUS: Full    Diet Recommendation: Heart Healthy     Chief Complaint  Patient presents with   Seizures     Brief history of present illness from the day of admission and additional interim summary     36 y.o. male with medical history significant for hypertension, alcohol abuse, and seizures, now presenting to the emergency department with witnessed seizures.  He apparently was driving himself to the hospital to get detoxed but while in the hospital he had to episodes of witnessed seizures and was admitted for further care.                                                                  Hospital Course   Alcohol intoxication, alcohol abuse which is ongoing history of epilepsy/seizures with noncompliance with Keppra, with breakthrough seizures . he had 2 episodes of witnessed seizures, alcohol level was 216, he is also noncompliant with Keppra.  CT head is stable, no focal deficits or headache, at this point Keppra has been resumed.    He was placed on Librium taper along with CIWA protocol, Keppra was resumed no further seizure activity, had mild DTs which are stable and in good control.   Patient has been adamant to leave the hospital throughout this morning, at this point I have told him to abstain from alcohol, continue taking Keppra follow-up with PCP and neurologist within the next few weeks.  Do not drive till cleared to do so by the neurologist.  Have told him that his chances of going into DTs are high and that ideally I would like to taper Librium for a few more days in the hospital but he is adamant that he has to leave and visit his-month-old child.  He understands that if he feels jittery he will come back to the hospital.  I do not want to give him Librium upon discharge as I am highly suspicious that he might mix it with alcohol which is undesired and potentially lethal combination.   Ongoing alcohol abuse in DTs.  Placed on scheduled Librium along with CIWA protocol.  Counseled to quit.  DTs today in control.   Hypertension.  Was dehydrated and hypotensive upon admission resolved after IV fluids.  Lisinopril less than home dose resumed from tomorrow.   Asymptomatic transaminitis.  Due to alcohol abuse.  Had recent right upper quadrant ultrasound suggesting fatty liver, discontinue alcohol outpatient follow-up with PCP for monitoring.   Seizure, dehydration and alcohol intoxication induced lactic acidosis.  No infection or sepsis, hydrate and monitor.   Right hand scrape wounds present on admission per patient happened during his work few days prior to admission.  Superficial skin abrasions, Neosporin ointment and wound care.  Seen by wound care nurse.  Neosporin ointment and dressing supplies for a week provided.  Requested to keep hand clean and dry.  He request 1 week off from work which has been provided.  Discharge diagnosis     Principal Problem:   Seizures ([place]) Active Problems:   Elevated transaminase level   Chronic alcohol abuse    Discharge instructions    Discharge Instructions     Diet - low sodium heart healthy   Complete by: As directed     Discharge instructions   Complete by: As directed    Do not drive, operate heavy machinery, perform activities at heights, swimming or participation in water activities or provide baby sitting services until you have seen by  your Neurologist and advised to do so again.  Keep your hands clean and dry at all times.  Continue to apply antibiotic I went once or twice a day, cover with single layer of xeroform (non adherent dressing). Wrap with conform gauze (1" or 2") change every other day  Follow with Primary MD Patient, No Pcp Per in 7 days   Get CBC, CMP, Magnesium -  checked next visit within 1 week by Primary MD    Activity: As tolerated with Full fall precautions use walker/cane & assistance as needed  Disposition Home    Diet: Heart Healthy    Special Instructions: If you have smoked or chewed Tobacco  in the last 2 yrs please stop smoking, stop any regular Alcohol  and or any Recreational drug use.  On your next visit with your primary care physician please Get Medicines reviewed and adjusted.  Please request your Prim.MD to go over all Hospital Tests and Procedure/Radiological results at the follow up, please get all Hospital records sent to your Prim MD by signing hospital release before you go home.  If you experience worsening of your admission symptoms, develop shortness of breath, life threatening emergency, suicidal or homicidal thoughts you must seek medical attention immediately by calling 911 or calling your MD immediately  if symptoms less severe.  You Must read complete instructions/literature along with all the possible adverse reactions/side effects for all the Medicines you take and that have been prescribed to you. Take any new Medicines after you have completely understood and accpet all the possible adverse reactions/side effects.   Discharge wound care:   Complete by: As directed    Keep your hands clean and dry at all times.  Continue to apply antibiotic I went  once or twice a day, cover with single layer of xeroform (non adherent dressing). Wrap with conform gauze (1" or 2") change every other day   Increase activity slowly   Complete by: As directed        Discharge Medications   Allergies as of 09/02/2022   No Known Allergies      Medication List     TAKE  these medications    folic acid 1 MG tablet Commonly known as: FOLVITE Take 1 tablet (1 mg total) by mouth daily.   gabapentin 300 MG capsule Commonly known as: Neurontin Take 1 capsule (300 mg total) by mouth 2 (two) times daily.   levETIRAcetam 500 MG tablet Commonly known as: Keppra Take 1 tablet (500 mg total) by mouth 2 (two) times daily.   lisinopril 20 MG tablet Commonly known as: ZESTRIL Take 1 tablet (20 mg total) by mouth daily. Start taking on: September 03, 2022 What changed:  medication strength how much to take   neomycin-bacitracin-polymyxin Oint Commonly known as: NEOSPORIN Apply 1 Application topically 3 (three) times daily. To affected skin in your hands   NONFORMULARY OR COMPOUNDED ITEM Please provide 1 week supply for the following dressing in his hands. Cover with single layer of xeroform (non adherent dressing). Wrap with conform gauze (1" or 2") change every other day   thiamine 100 MG tablet Commonly known as: VITAMIN B1 Take 1 tablet (100 mg total) by mouth daily.               Discharge Care Instructions  (From admission, onward)           Start     Ordered   09/02/22 0000  Discharge wound care:       Comments: Keep your hands clean and dry at all times.  Continue to apply antibiotic I went once or twice a day, cover with single layer of xeroform (non adherent dressing). Wrap with conform gauze (1" or 2") change every other day   09/02/22 0856             Follow-up Information     [facility]. Schedule an appointment as soon as possible for a visit in 1 week(s).   Contact  information: [place] [phone]        [facility]. Schedule an appointment as soon as possible for a visit in 1 week(s).   Specialty: Neurology Why: Seizures Contact information: [place] [phone]                Major procedures and Radiology Reports - PLEASE review detailed and final reports thoroughly  -       CT Head Wo Contrast  Result Date: 08/30/2022 CLINICAL DATA:  Seizure, new-onset, no history of trauma EXAM: CT HEAD WITHOUT CONTRAST TECHNIQUE: Contiguous axial images were obtained from the base of the skull through the vertex without intravenous contrast. RADIATION DOSE REDUCTION: This exam was performed according to the departmental dose-optimization program which includes automated exposure control, adjustment of the mA and/or kV according to patient size and/or use of iterative reconstruction technique. COMPARISON:  Head CT 05/24/2022, brain MRI 04/25/2022 FINDINGS: Brain: Stable mild but age advanced atrophy. No intracranial hemorrhage, mass effect, or midline shift. No hydrocephalus. The basilar cisterns are patent. No evidence of territorial infarct or acute ischemia. No extra-axial or intracranial fluid collection. Vascular: No hyperdense vessel or unexpected calcification. Skull: No fracture or focal lesion. Sinuses/Orbits: Paranasal sinuses and mastoid air cells are clear. The visualized orbits are unremarkable. Other: None. IMPRESSION: 1. No acute intracranial abnormality. 2. Stable mild but age advanced atrophy. Electronically Signed   By: Keith Rake M.D.   On: 08/30/2022 19:14    Today   Subjective    Adam [name] today has no headache,no chest  abdominal pain,no new weakness tingling or numbness, feels much better wants to go home today.    Objective   Blood pressure 114/80, pulse 80, temperature 97.6 F (36.4  C), temperature source Oral, resp. rate 14, SpO2 98 %.   Intake/Output Summary (Last 24 hours) at 09/02/2022 0857 Last data filed at 09/02/2022 0315 Gross per 24 hour  Intake 2090.39 ml  Output 800 ml  Net 1290.39 ml    Exam  Awake Alert, No new F.N deficits,  no DTs [place].AT,PERRAL Supple Neck,   Symmetrical Chest wall movement, Good air movement bilaterally, CTAB RRR,No Gallops,   +ve B.Sounds, Abd Soft, Non tender,  No Cyanosis, Clubbing or edema    Data Review   Recent Labs  Lab 08/30/22 1506 08/31/22 0400 09/01/22 0248 09/02/22 0309  WBC 5.9 3.3* 3.4* 3.7*  HGB 14.1 11.2* 11.4* 11.7*  HCT 40.3 32.6* 32.0* 32.6*  PLT 263 193 192 162  MCV 98.3 97.9 95.8 95.9  MCH 34.4* 33.6 34.1* 34.4*  MCHC 35.0 34.4 35.6 35.9  RDW 12.3 12.4 11.9 11.9  LYMPHSABS 1.2  --  1.3 1.2  MONOABS 0.4  --  0.3 0.2  EOSABS 0.1  --  0.0 0.0  BASOSABS 0.1  --  0.0 0.0    Recent Labs  Lab 08/30/22 1506 08/30/22 1550 08/30/22 1704 08/30/22 1936 08/31/22 0400 09/01/22 0248 09/02/22 0309  NA  --   --  135  --  135 135 140  K  --   --  4.6  --  3.7 3.7 4.1  CL  --   --  98  --  103 102 105  CO2  --   --  21*  --  22 25 24   GLUCOSE  --   --  84  --  101* 105* 82  BUN  --   --  28*  --  23* 14 9  CREATININE  --   --  1.44*  --  1.29* 1.14 1.03  CALCIUM  --   --  9.2  --  8.7* 8.4* 9.4  AST  --   --  77*  --  58* 47* 85*  ALT  --   --  79*  --  63* 54* 73*  ALKPHOS  --   --  53  --  43 42 38  BILITOT  --   --  0.6  --  0.8 1.0 1.0  ALBUMIN  --   --  4.2  --  3.4* 3.3* 3.3*  MG  --   --   --   --  1.7 2.1 2.1  PHOS  --   --   --   --   --  2.7  --   LATICACIDVEN  --  3.5*  --  2.5* 2.3*  --   --   INR 0.9  --   --   --   --   --   --   BNP  --   --   --   --   --  109.9* 45.4    Total Time in preparing paper work, data evaluation and todays exam - 35 minutes  Lala Lund M.D on 09/02/2022 at 8:57 AM  [facility]

## 2022-09-02 NOTE — Plan of Care (Signed)
  Problem: Education: Goal: Knowledge of General Education information will improve Description: Including pain rating scale, medication(s)/side effects and non-pharmacologic comfort measures 09/02/2022 1113 by Colon Flattery, RN Outcome: Adequate for Discharge 09/02/2022 1112 by Colon Flattery, RN Outcome: Progressing   Problem: Health Behavior/Discharge Planning: Goal: Ability to manage health-related needs will improve 09/02/2022 1113 by Colon Flattery, RN Outcome: Adequate for Discharge 09/02/2022 1112 by Colon Flattery, RN Outcome: Progressing   Problem: Clinical Measurements: Goal: Ability to maintain clinical measurements within normal limits will improve 09/02/2022 1113 by Colon Flattery, RN Outcome: Adequate for Discharge 09/02/2022 1112 by Colon Flattery, RN Outcome: Progressing Goal: Will remain free from infection 09/02/2022 1113 by Colon Flattery, RN Outcome: Adequate for Discharge 09/02/2022 1112 by Colon Flattery, RN Outcome: Progressing Goal: Diagnostic test results will improve 09/02/2022 1113 by Colon Flattery, RN Outcome: Adequate for Discharge 09/02/2022 1112 by Colon Flattery, RN Outcome: Progressing Goal: Respiratory complications will improve 09/02/2022 1113 by Colon Flattery, RN Outcome: Adequate for Discharge 09/02/2022 1112 by Colon Flattery, RN Outcome: Progressing Goal: Cardiovascular complication will be avoided 09/02/2022 1113 by Colon Flattery, RN Outcome: Adequate for Discharge 09/02/2022 1112 by Colon Flattery, RN Outcome: Progressing   Problem: Activity: Goal: Risk for activity intolerance will decrease 09/02/2022 1113 by Colon Flattery, RN Outcome: Adequate for Discharge 09/02/2022 1112 by Colon Flattery, RN Outcome: Progressing   Problem: Nutrition: Goal: Adequate nutrition will be maintained 09/02/2022 1113 by Colon Flattery, RN Outcome: Adequate for Discharge 09/02/2022 1112 by Colon Flattery, RN Outcome: Progressing    Problem: Coping: Goal: Level of anxiety will decrease 09/02/2022 1113 by Colon Flattery, RN Outcome: Adequate for Discharge 09/02/2022 1112 by Colon Flattery, RN Outcome: Progressing   Problem: Elimination: Goal: Will not experience complications related to bowel motility 09/02/2022 1113 by Colon Flattery, RN Outcome: Adequate for Discharge 09/02/2022 1112 by Colon Flattery, RN Outcome: Progressing Goal: Will not experience complications related to urinary retention 09/02/2022 1113 by Colon Flattery, RN Outcome: Adequate for Discharge 09/02/2022 1112 by Colon Flattery, RN Outcome: Progressing   Problem: Pain Managment: Goal: General experience of comfort will improve 09/02/2022 1113 by Colon Flattery, RN Outcome: Adequate for Discharge 09/02/2022 1112 by Colon Flattery, RN Outcome: Progressing   Problem: Safety: Goal: Ability to remain free from injury will improve 09/02/2022 1113 by Colon Flattery, RN Outcome: Adequate for Discharge 09/02/2022 1112 by Colon Flattery, RN Outcome: Progressing   Problem: Skin Integrity: Goal: Risk for impaired skin integrity will decrease 09/02/2022 1113 by Colon Flattery, RN Outcome: Adequate for Discharge 09/02/2022 1112 by Colon Flattery, RN Outcome: Progressing

## 2022-09-02 NOTE — Plan of Care (Signed)

## 2022-09-11 ENCOUNTER — Inpatient Hospital Stay (HOSPITAL_COMMUNITY)
Admission: EM | Admit: 2022-09-11 | Discharge: 2022-09-13 | DRG: 101 | Disposition: A | Discharge status: Home / Self Care | Payer: Self-pay | Attending: Internal Medicine | Admitting: Internal Medicine

## 2022-09-11 ENCOUNTER — Other Ambulatory Visit: Payer: Self-pay

## 2022-09-11 ENCOUNTER — Encounter (HOSPITAL_COMMUNITY): Payer: Self-pay

## 2022-09-11 ENCOUNTER — Emergency Department (HOSPITAL_COMMUNITY): Payer: Self-pay

## 2022-09-11 DIAGNOSIS — Z720 Tobacco use: Secondary | ICD-10-CM

## 2022-09-11 DIAGNOSIS — F1010 Alcohol abuse, uncomplicated: Secondary | ICD-10-CM | POA: Diagnosis present

## 2022-09-11 DIAGNOSIS — Z801 Family history of malignant neoplasm of trachea, bronchus and lung: Secondary | ICD-10-CM

## 2022-09-11 DIAGNOSIS — F10131 Alcohol abuse with withdrawal delirium: Secondary | ICD-10-CM | POA: Diagnosis present

## 2022-09-11 DIAGNOSIS — Z91199 Patient's noncompliance with other medical treatment and regimen due to unspecified reason: Secondary | ICD-10-CM

## 2022-09-11 DIAGNOSIS — D539 Nutritional anemia, unspecified: Secondary | ICD-10-CM | POA: Diagnosis present

## 2022-09-11 DIAGNOSIS — D72819 Decreased white blood cell count, unspecified: Secondary | ICD-10-CM | POA: Diagnosis present

## 2022-09-11 DIAGNOSIS — Z79899 Other long term (current) drug therapy: Secondary | ICD-10-CM

## 2022-09-11 DIAGNOSIS — G40909 Epilepsy, unspecified, not intractable, without status epilepticus: Secondary | ICD-10-CM | POA: Diagnosis present

## 2022-09-11 DIAGNOSIS — Z8659 Personal history of other mental and behavioral disorders: Secondary | ICD-10-CM

## 2022-09-11 DIAGNOSIS — R569 Unspecified convulsions: Secondary | ICD-10-CM

## 2022-09-11 DIAGNOSIS — G40509 Epileptic seizures related to external causes, not intractable, without status epilepticus: Principal | ICD-10-CM | POA: Diagnosis present

## 2022-09-11 DIAGNOSIS — R7401 Elevation of levels of liver transaminase levels: Secondary | ICD-10-CM | POA: Diagnosis present

## 2022-09-11 DIAGNOSIS — Z8249 Family history of ischemic heart disease and other diseases of the circulatory system: Secondary | ICD-10-CM

## 2022-09-11 DIAGNOSIS — Y906 Blood alcohol level of 120-199 mg/100 ml: Secondary | ICD-10-CM | POA: Diagnosis present

## 2022-09-11 DIAGNOSIS — F10932 Alcohol use, unspecified with withdrawal with perceptual disturbance: Principal | ICD-10-CM

## 2022-09-11 DIAGNOSIS — N179 Acute kidney failure, unspecified: Secondary | ICD-10-CM | POA: Diagnosis present

## 2022-09-11 DIAGNOSIS — T23231A Burn of second degree of multiple right fingers (nail), not including thumb, initial encounter: Secondary | ICD-10-CM | POA: Diagnosis present

## 2022-09-11 DIAGNOSIS — I10 Essential (primary) hypertension: Secondary | ICD-10-CM | POA: Diagnosis present

## 2022-09-11 DIAGNOSIS — F10939 Alcohol use, unspecified with withdrawal, unspecified: Secondary | ICD-10-CM | POA: Diagnosis present

## 2022-09-11 LAB — CBG MONITORING, ED: Glucose-Capillary: 88 mg/dL (ref 70–99)

## 2022-09-11 MED ORDER — LORAZEPAM 1 MG PO TABS: 0.0000 mg | ORAL_TABLET | Freq: Two times a day (BID) | ORAL | Status: DC

## 2022-09-11 MED ORDER — LEVETIRACETAM IN NACL 1000 MG/100ML IV SOLN
1000.0000 mg | Freq: Once | INTRAVENOUS | Status: AC
  Administered 2022-09-11: 1000 mg via INTRAVENOUS
  Filled 2022-09-11: qty 100

## 2022-09-11 MED ORDER — LORAZEPAM 1 MG PO TABS
0.0000 mg | ORAL_TABLET | Freq: Four times a day (QID) | ORAL | Status: DC
  Administered 2022-09-12 (×2): 1 mg via ORAL
  Filled 2022-09-11 (×2): qty 1

## 2022-09-11 MED ORDER — LORAZEPAM 2 MG/ML IJ SOLN: 0.0000 mg | Freq: Two times a day (BID) | INTRAMUSCULAR | Status: DC

## 2022-09-11 MED ORDER — LORAZEPAM 2 MG/ML IJ SOLN
0.0000 mg | Freq: Four times a day (QID) | INTRAMUSCULAR | Status: DC
  Administered 2022-09-11: 2 mg via INTRAVENOUS
  Administered 2022-09-12: 4 mg via INTRAVENOUS
  Filled 2022-09-11: qty 2

## 2022-09-11 MED ORDER — THIAMINE MONONITRATE 100 MG PO TABS
100.0000 mg | ORAL_TABLET | Freq: Every day | ORAL | Status: DC
  Administered 2022-09-12 – 2022-09-13 (×2): 100 mg via ORAL
  Filled 2022-09-11 (×2): qty 1

## 2022-09-11 MED ORDER — LORAZEPAM 2 MG/ML IJ SOLN
2.0000 mg | Freq: Once | INTRAMUSCULAR | Status: DC
  Filled 2022-09-11: qty 1

## 2022-09-11 MED ORDER — THIAMINE HCL 100 MG/ML IJ SOLN
100.0000 mg | Freq: Every day | INTRAMUSCULAR | Status: DC
  Filled 2022-09-11: qty 2

## 2022-09-11 NOTE — ED Provider Notes (Signed)
[facility]-EMERGENCY DEPT [facility] Emergency Department Provider Note MRN:  [id number]  Arrival date & time: 09/12/22     Chief Complaint   Seizures   History of Present Illness   Adam [name]. is a 36 y.o. year-old male presents to the ED with chief complaint of seizures.  Brought in by EMS.  Patient had 2 seizures today and 1 while in the emergency department.  Patient states that he is withdrawing from alcohol.  Has history of alcohol withdrawal seizures and history of DTs.  Also has history of seizure disorder and takes Keppra, but has history of noncompliance.  Additional history currently limited by post-ictal state.  Hx provided by nursing and EMS.   Review of Systems  Pertinent positive and negative review of systems noted in HPI.    Physical Exam   Vitals:   09/11/22 2351 09/12/22 0030  BP: 118/87 128/84  Pulse: 92 83  Resp: (!) 27 18  Temp:    SpO2: 98% 96%    CONSTITUTIONAL:  intoxicated-appearing, NAD NEURO:  Alert and oriented x 3, CN 3-12 grossly intact EYES:  eyes equal and reactive ENT/NECK:  Supple, no stridor  CARDIO:  normal rate, regular rhythm, appears well-perfused  PULM:  No respiratory distress, CTAB GI/GU:  non-distended,  MSK/SPINE:  No gross deformities, no edema, moves all extremities  SKIN:  no rash, atraumatic   *Additional and/or pertinent findings included in MDM below  Diagnostic and Interventional Summary    EKG Interpretation  Date/Time:  Saturday September 11 2022 22:55:06 EDT Ventricular Rate:  87 PR Interval:  161 QRS Duration: 95 QT Interval:  357 QTC Calculation: 430 R Axis:   72 Text Interpretation: Sinus rhythm Normal ECG When compared with ECG of 08/31/2022, No significant change was found Confirmed by Dione Booze ([id number]) on 09/11/2022 11:55:06 PM       Labs Reviewed  COMPREHENSIVE METABOLIC PANEL - Abnormal; Notable for the following components:      Result Value   BUN 27 (*)    Creatinine, Ser 2.15  (*)    AST 48 (*)    ALT 62 (*)    GFR, Estimated 40 (*)    All other components within normal limits  SALICYLATE LEVEL - Abnormal; Notable for the following components:   Salicylate Lvl <7.0 (*)    All other components within normal limits  ACETAMINOPHEN LEVEL - Abnormal; Notable for the following components:   Acetaminophen (Tylenol), Serum <10 (*)    All other components within normal limits  ETHANOL - Abnormal; Notable for the following components:   Alcohol, Ethyl (B) 149 (*)    All other components within normal limits  CBC WITH DIFFERENTIAL/PLATELET - Abnormal; Notable for the following components:   WBC 3.7 (*)    RBC 3.65 (*)    Hemoglobin 12.3 (*)    HCT 37.6 (*)    MCV 103.0 (*)    All other components within normal limits  RAPID URINE DRUG SCREEN, HOSP PERFORMED  RAPID URINE DRUG SCREEN, HOSP PERFORMED  CBG MONITORING, ED    DG Chest 2 View  Final Result    CT HEAD WO CONTRAST ( )  Final Result      Medications  LORazepam (ATIVAN) injection 2 mg (2 mg Intramuscular Not Given 09/12/22 0058)  LORazepam (ATIVAN) injection 0-4 mg (2 mg Intravenous Given 09/11/22 2343)    Or  LORazepam (ATIVAN) tablet 0-4 mg ( Oral See Alternative 09/11/22 2343)  LORazepam (ATIVAN) injection 0-4 mg (has  no administration in time range)    Or  LORazepam (ATIVAN) tablet 0-4 mg (has no administration in time range)  thiamine (VITAMIN B1) tablet 100 mg (has no administration in time range)    Or  thiamine (VITAMIN B1) injection 100 mg (has no administration in time range)  levETIRAcetam (KEPPRA) IVPB 1000 mg/100 mL premix (0 mg Intravenous Stopped 09/12/22 0128)  lactated ringers bolus 1,000 mL (1,000 mLs Intravenous New Bag/Given 09/12/22 0128)     Procedures  /  Critical Care .Critical Care  Performed by: Roxy Horseman, PA-C Authorized by: Roxy Horseman, PA-C   Critical care provider statement:    Critical care time (minutes):  49   Critical care was necessary to  treat or prevent imminent or life-threatening deterioration of the following conditions:  [place]   Critical care was time spent personally by me on the following activities:  Development of treatment plan with patient or surrogate, discussions with consultants, evaluation of patient's response to treatment, examination of patient, ordering and review of laboratory studies, ordering and review of radiographic studies, ordering and performing treatments and interventions, pulse oximetry, re-evaluation of patient's condition and review of old charts   ED Course and Medical Decision Making  I have reviewed the triage vital signs, the nursing notes, and pertinent available records from the EMR.  Social Determinants Affecting Complexity of Care: Patient is affected by alcoholism.   ED Course:    Medical Decision Making Patient here after having 2 seizures earlier today and 1 seizure while in the emergency department.  He is currently on CIWA protocol.  He is alert and oriented now.  Not postictal now.  He states that he was drinking alcohol earlier today.  Also reports that he has had some visual hallucinations.  Has history of DTs and alcohol withdrawal seizure.  Was recently admitted for the same.  Laboratory work-up notable as below.  We will treat AKI with fluids.  Patient will need admission to hospitalist.  Patient is agreeable with staying in the hospital now.  States that he had to leave AMA because his son had surgery.  Amount and/or Complexity of Data Reviewed Labs: ordered.    Details: Creatinine is 2.15, this is up from most recent on 10/12.  We will give LR. Ethanol level is elevated at 149. Radiology: ordered and independent interpretation performed.    Details: No obvious ICH. ECG/medicine tests: ordered and independent interpretation performed.    Details: NSR  Risk OTC drugs. Prescription drug management. Decision regarding hospitalization.     Consultants: I  discussed the case with Hospitalist, Dr. Margo Aye, who is appreciated for admitting.   Treatment and Plan: Patient's exam and diagnostic results are concerning for alcohol withdrawal seizures.  Feel that patient will need admission to the hospital for further treatment and evaluation.    Final Clinical Impressions(s) / ED Diagnoses     ICD-10-CM   1. Alcohol withdrawal syndrome with perceptual disturbance [facility])  J19.147       ED Discharge Orders     None         Discharge Instructions Discussed with and Provided to Patient:   Discharge Instructions   None      Roxy Horseman, PA-C 09/12/22 0133    Dione Booze, MD 09/12/22 [phone]

## 2022-09-11 NOTE — ED Triage Notes (Signed)
Pt. BIB [facility] for seizures x2 today. Pt. States that he is withdrawing from ETOH. Last drink was this morning.   EMS VS:  HR: 100 BP: 156/88 RR:18 O2:99 CBG: 102

## 2022-09-12 ENCOUNTER — Emergency Department (HOSPITAL_COMMUNITY): Payer: Self-pay

## 2022-09-12 DIAGNOSIS — F10930 Alcohol use, unspecified with withdrawal, uncomplicated: Secondary | ICD-10-CM

## 2022-09-12 DIAGNOSIS — N179 Acute kidney failure, unspecified: Secondary | ICD-10-CM

## 2022-09-12 DIAGNOSIS — F10931 Alcohol use, unspecified with withdrawal delirium: Secondary | ICD-10-CM

## 2022-09-12 DIAGNOSIS — R569 Unspecified convulsions: Secondary | ICD-10-CM

## 2022-09-12 DIAGNOSIS — F1010 Alcohol abuse, uncomplicated: Secondary | ICD-10-CM

## 2022-09-12 DIAGNOSIS — F10932 Alcohol use, unspecified with withdrawal with perceptual disturbance: Secondary | ICD-10-CM

## 2022-09-12 LAB — CBC WITH DIFFERENTIAL/PLATELET
Abs Immature Granulocytes: 0.01 10*3/uL (ref 0.00–0.07)
Abs Immature Granulocytes: 0.01 10*3/uL (ref 0.00–0.07)
Basophils Absolute: 0 10*3/uL (ref 0.0–0.1)
Basophils Absolute: 0 10*3/uL (ref 0.0–0.1)
Basophils Relative: 1 %
Basophils Relative: 1 %
Eosinophils Absolute: 0 10*3/uL (ref 0.0–0.5)
Eosinophils Absolute: 0 10*3/uL (ref 0.0–0.5)
Eosinophils Relative: 1 %
Eosinophils Relative: 1 %
HCT: 32.8 % — ABNORMAL LOW (ref 39.0–52.0)
HCT: 37.6 % — ABNORMAL LOW (ref 39.0–52.0)
Hemoglobin: 11 g/dL — ABNORMAL LOW (ref 13.0–17.0)
Hemoglobin: 12.3 g/dL — ABNORMAL LOW (ref 13.0–17.0)
Immature Granulocytes: 0 %
Immature Granulocytes: 0 %
Lymphocytes Relative: 40 %
Lymphocytes Relative: 42 %
Lymphs Abs: 1.2 10*3/uL (ref 0.7–4.0)
Lymphs Abs: 1.5 10*3/uL (ref 0.7–4.0)
MCH: 33.7 pg (ref 26.0–34.0)
MCH: 34.6 pg — ABNORMAL HIGH (ref 26.0–34.0)
MCHC: 32.7 g/dL (ref 30.0–36.0)
MCHC: 33.5 g/dL (ref 30.0–36.0)
MCV: 103 fL — ABNORMAL HIGH (ref 80.0–100.0)
MCV: 103.1 fL — ABNORMAL HIGH (ref 80.0–100.0)
Monocytes Absolute: 0.2 10*3/uL (ref 0.1–1.0)
Monocytes Absolute: 0.3 10*3/uL (ref 0.1–1.0)
Monocytes Relative: 8 %
Monocytes Relative: 9 %
Neutro Abs: 1.3 10*3/uL — ABNORMAL LOW (ref 1.7–7.7)
Neutro Abs: 1.9 10*3/uL (ref 1.7–7.7)
Neutrophils Relative %: 48 %
Neutrophils Relative %: 49 %
Platelets: 190 10*3/uL (ref 150–400)
Platelets: 201 10*3/uL (ref 150–400)
RBC: 3.18 MIL/uL — ABNORMAL LOW (ref 4.22–5.81)
RBC: 3.65 MIL/uL — ABNORMAL LOW (ref 4.22–5.81)
RDW: 12.6 % (ref 11.5–15.5)
RDW: 12.9 % (ref 11.5–15.5)
WBC: 2.8 10*3/uL — ABNORMAL LOW (ref 4.0–10.5)
WBC: 3.7 10*3/uL — ABNORMAL LOW (ref 4.0–10.5)
nRBC: 0 % (ref 0.0–0.2)
nRBC: 0 % (ref 0.0–0.2)

## 2022-09-12 LAB — COMPREHENSIVE METABOLIC PANEL
ALT: 50 U/L — ABNORMAL HIGH (ref 0–44)
ALT: 62 U/L — ABNORMAL HIGH (ref 0–44)
AST: 39 U/L (ref 15–41)
AST: 48 U/L — ABNORMAL HIGH (ref 15–41)
Albumin: 3.6 g/dL (ref 3.5–5.0)
Albumin: 4.3 g/dL (ref 3.5–5.0)
Alkaline Phosphatase: 37 U/L — ABNORMAL LOW (ref 38–126)
Alkaline Phosphatase: 46 U/L (ref 38–126)
Anion gap: 8 (ref 5–15)
Anion gap: 8 (ref 5–15)
BUN: 24 mg/dL — ABNORMAL HIGH (ref 6–20)
BUN: 27 mg/dL — ABNORMAL HIGH (ref 6–20)
CO2: 26 mmol/L (ref 22–32)
CO2: 28 mmol/L (ref 22–32)
Calcium: 9 mg/dL (ref 8.9–10.3)
Calcium: 9.4 mg/dL (ref 8.9–10.3)
Chloride: 105 mmol/L (ref 98–111)
Chloride: 106 mmol/L (ref 98–111)
Creatinine, Ser: 1.57 mg/dL — ABNORMAL HIGH (ref 0.61–1.24)
Creatinine, Ser: 2.15 mg/dL — ABNORMAL HIGH (ref 0.61–1.24)
GFR, Estimated: 40 mL/min — ABNORMAL LOW (ref >60)
GFR, Estimated: 59 mL/min — ABNORMAL LOW (ref >60)
Glucose, Bld: 106 mg/dL — ABNORMAL HIGH (ref 70–99)
Glucose, Bld: 90 mg/dL (ref 70–99)
Potassium: 3.8 mmol/L (ref 3.5–5.1)
Potassium: 4 mmol/L (ref 3.5–5.1)
Sodium: 140 mmol/L (ref 135–145)
Sodium: 141 mmol/L (ref 135–145)
Total Bilirubin: 0.6 mg/dL (ref 0.3–1.2)
Total Bilirubin: 0.7 mg/dL (ref 0.3–1.2)
Total Protein: 5.8 g/dL — ABNORMAL LOW (ref 6.5–8.1)
Total Protein: 7 g/dL (ref 6.5–8.1)

## 2022-09-12 LAB — ACETAMINOPHEN LEVEL: Acetaminophen (Tylenol), Serum: <10 ug/mL — ABNORMAL LOW (ref 10–30)

## 2022-09-12 LAB — ETHANOL: Alcohol, Ethyl (B): 149 mg/dL — ABNORMAL HIGH (ref <10)

## 2022-09-12 LAB — PHOSPHORUS: Phosphorus: 2.8 mg/dL (ref 2.5–4.6)

## 2022-09-12 LAB — SALICYLATE LEVEL: Salicylate Lvl: <7 mg/dL — ABNORMAL LOW (ref 7.0–30.0)

## 2022-09-12 LAB — MAGNESIUM: Magnesium: 2 mg/dL (ref 1.7–2.4)

## 2022-09-12 MED ORDER — MUPIROCIN CALCIUM 2 % EX CREA
TOPICAL_CREAM | Freq: Two times a day (BID) | CUTANEOUS | Status: DC
  Filled 2022-09-12 (×2): qty 15

## 2022-09-12 MED ORDER — LORAZEPAM 2 MG/ML IJ SOLN
2.0000 mg | Freq: Four times a day (QID) | INTRAMUSCULAR | Status: DC | PRN
  Administered 2022-09-13: 2 mg via INTRAVENOUS
  Filled 2022-09-12: qty 1

## 2022-09-12 MED ORDER — DOXYCYCLINE HYCLATE 100 MG PO TABS
100.0000 mg | ORAL_TABLET | Freq: Two times a day (BID) | ORAL | Status: DC
  Administered 2022-09-12 – 2022-09-13 (×3): 100 mg via ORAL
  Filled 2022-09-12 (×3): qty 1

## 2022-09-12 MED ORDER — ACETAMINOPHEN 325 MG PO TABS
650.0000 mg | ORAL_TABLET | Freq: Four times a day (QID) | ORAL | Status: DC | PRN
  Administered 2022-09-12 – 2022-09-13 (×2): 650 mg via ORAL
  Filled 2022-09-12 (×2): qty 2

## 2022-09-12 MED ORDER — MELATONIN 5 MG PO TABS: 5.0000 mg | ORAL_TABLET | Freq: Every evening | ORAL | Status: DC | PRN

## 2022-09-12 MED ORDER — LEVETIRACETAM 500 MG PO TABS
500.0000 mg | ORAL_TABLET | Freq: Two times a day (BID) | ORAL | Status: DC
  Administered 2022-09-12 – 2022-09-13 (×4): 500 mg via ORAL
  Filled 2022-09-12 (×4): qty 1

## 2022-09-12 MED ORDER — POLYETHYLENE GLYCOL 3350 17 G PO PACK: 17.0000 g | PACK | Freq: Every day | ORAL | Status: DC | PRN

## 2022-09-12 MED ORDER — LACTATED RINGERS IV BOLUS
1000.0000 mL | Freq: Once | INTRAVENOUS | Status: AC
  Administered 2022-09-12: 1000 mL via INTRAVENOUS

## 2022-09-12 MED ORDER — FOLIC ACID 1 MG PO TABS
1.0000 mg | ORAL_TABLET | Freq: Every day | ORAL | Status: DC
  Administered 2022-09-12 – 2022-09-13 (×2): 1 mg via ORAL
  Filled 2022-09-12 (×2): qty 1

## 2022-09-12 MED ORDER — GABAPENTIN 300 MG PO CAPS
300.0000 mg | ORAL_CAPSULE | Freq: Two times a day (BID) | ORAL | Status: DC
  Administered 2022-09-12 – 2022-09-13 (×3): 300 mg via ORAL
  Filled 2022-09-12 (×3): qty 1

## 2022-09-12 MED ORDER — SODIUM CHLORIDE 0.9 % IV SOLN: INTRAVENOUS | Status: DC

## 2022-09-12 MED ORDER — DIAZEPAM 5 MG PO TABS
10.0000 mg | ORAL_TABLET | Freq: Three times a day (TID) | ORAL | Status: DC
  Administered 2022-09-12 – 2022-09-13 (×3): 10 mg via ORAL
  Filled 2022-09-12 (×4): qty 2

## 2022-09-12 MED ORDER — ONDANSETRON HCL 4 MG/2ML IJ SOLN: 4.0000 mg | Freq: Four times a day (QID) | INTRAMUSCULAR | Status: DC | PRN
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
## 2022-09-12 NOTE — ED Notes (Signed)
Zigmund Daniel RN stated pt is able to come up.

## 2022-09-13 ENCOUNTER — Other Ambulatory Visit (HOSPITAL_COMMUNITY): Payer: Self-pay

## 2022-09-13 LAB — BASIC METABOLIC PANEL
Anion gap: 7 (ref 5–15)
BUN: 17 mg/dL (ref 6–20)
CO2: 25 mmol/L (ref 22–32)
Calcium: 9.1 mg/dL (ref 8.9–10.3)
Chloride: 104 mmol/L (ref 98–111)
Creatinine, Ser: 1.01 mg/dL (ref 0.61–1.24)
GFR, Estimated: >60 mL/min (ref >60)
Glucose, Bld: 110 mg/dL — ABNORMAL HIGH (ref 70–99)
Potassium: 3.8 mmol/L (ref 3.5–5.1)
Sodium: 136 mmol/L (ref 135–145)

## 2022-09-13 MED ORDER — ADULT MULTIVITAMIN W/MINERALS CH
1.0000 | ORAL_TABLET | Freq: Every day | ORAL | Status: DC
  Administered 2022-09-13: 1 via ORAL
  Filled 2022-09-13: qty 1

## 2022-09-13 MED ORDER — LEVETIRACETAM 500 MG PO TABS: 500.0000 mg | ORAL_TABLET | Freq: Two times a day (BID) | ORAL | 3 refills | Status: DC

## 2022-09-13 MED ORDER — LEVETIRACETAM 500 MG PO TABS
500.0000 mg | ORAL_TABLET | Freq: Two times a day (BID) | ORAL | 3 refills | Status: DC
  Filled 2022-09-13: qty 60, 30d supply, fill #0

## 2022-09-13 MED ORDER — CHLORDIAZEPOXIDE HCL 5 MG PO CAPS: 25.0000 mg | ORAL_CAPSULE | Freq: Three times a day (TID) | ORAL | Status: DC

## 2022-09-13 MED ORDER — GABAPENTIN 300 MG PO CAPS
300.0000 mg | ORAL_CAPSULE | Freq: Two times a day (BID) | ORAL | 0 refills | Status: DC
  Filled 2022-09-13: qty 60, 30d supply, fill #0

## 2022-09-13 MED ORDER — GABAPENTIN 300 MG PO CAPS: 300.0000 mg | ORAL_CAPSULE | Freq: Two times a day (BID) | ORAL | 0 refills | Status: DC

## 2022-09-13 MED ORDER — CHLORDIAZEPOXIDE HCL 5 MG PO CAPS: 25.0000 mg | ORAL_CAPSULE | Freq: Every day | ORAL | Status: DC

## 2022-09-13 MED ORDER — MUPIROCIN CALCIUM 2 % EX CREA: TOPICAL_CREAM | Freq: Two times a day (BID) | CUTANEOUS | 1 refills | Status: DC

## 2022-09-13 MED ORDER — HYDROXYZINE HCL 25 MG PO TABS: 25.0000 mg | ORAL_TABLET | Freq: Four times a day (QID) | ORAL | Status: DC | PRN

## 2022-09-13 MED ORDER — LISINOPRIL 20 MG PO TABS: 20.0000 mg | ORAL_TABLET | Freq: Every day | ORAL | 3 refills | Status: DC

## 2022-09-13 MED ORDER — CHLORDIAZEPOXIDE HCL 5 MG PO CAPS: 25.0000 mg | ORAL_CAPSULE | ORAL | Status: DC

## 2022-09-13 MED ORDER — THIAMINE HCL 100 MG/ML IJ SOLN: 100.0000 mg | Freq: Once | INTRAMUSCULAR | Status: DC

## 2022-09-13 MED ORDER — DOXYCYCLINE HYCLATE 100 MG PO TABS: 100.0000 mg | ORAL_TABLET | Freq: Two times a day (BID) | ORAL | 0 refills | Status: AC

## 2022-09-13 MED ORDER — MUPIROCIN CALCIUM 2 % EX CREA
TOPICAL_CREAM | Freq: Two times a day (BID) | CUTANEOUS | 1 refills | Status: DC
  Filled 2022-09-13: qty 30, 20d supply, fill #0

## 2022-09-13 MED ORDER — FOLIC ACID 1 MG PO TABS: 1.0000 mg | ORAL_TABLET | Freq: Every day | ORAL | 0 refills | Status: DC

## 2022-09-13 MED ORDER — CHLORDIAZEPOXIDE HCL 5 MG PO CAPS
25.0000 mg | ORAL_CAPSULE | Freq: Four times a day (QID) | ORAL | Status: DC
  Administered 2022-09-13: 25 mg via ORAL
  Filled 2022-09-13: qty 5

## 2022-09-13 MED ORDER — LISINOPRIL 20 MG PO TABS
20.0000 mg | ORAL_TABLET | Freq: Every day | ORAL | 3 refills | Status: DC
  Filled 2022-09-13: qty 30, 30d supply, fill #0

## 2022-09-13 MED ORDER — CHLORDIAZEPOXIDE HCL 25 MG PO CAPS: ORAL_CAPSULE | ORAL | 0 refills | Status: DC

## 2022-09-13 MED ORDER — CHLORDIAZEPOXIDE HCL 25 MG PO CAPS
ORAL_CAPSULE | ORAL | 0 refills | Status: DC
  Filled 2022-09-13: qty 9, 3d supply, fill #0

## 2022-09-13 MED ORDER — DOXYCYCLINE HYCLATE 100 MG PO TABS
100.0000 mg | ORAL_TABLET | Freq: Two times a day (BID) | ORAL | 0 refills | Status: DC
  Filled 2022-09-13: qty 14, 7d supply, fill #0

## 2022-09-13 MED ORDER — ONDANSETRON 4 MG PO TBDP: 4.0000 mg | ORAL_TABLET | Freq: Four times a day (QID) | ORAL | Status: DC | PRN

## 2022-09-13 MED ORDER — LORAZEPAM 2 MG/ML IJ SOLN: 1.0000 mg | INTRAMUSCULAR | Status: DC | PRN

## 2022-09-13 MED ORDER — LOPERAMIDE HCL 2 MG PO CAPS: 2.0000 mg | ORAL_CAPSULE | ORAL | Status: DC | PRN

## 2022-09-13 MED ORDER — LORAZEPAM 1 MG PO TABS: 1.0000 mg | ORAL_TABLET | ORAL | Status: DC | PRN

## 2022-09-13 MED ORDER — THIAMINE HCL 100 MG PO TABS
100.0000 mg | ORAL_TABLET | Freq: Every day | ORAL | 1 refills | Status: DC
  Filled 2022-09-13: qty 30, 30d supply, fill #0

## 2022-09-13 MED ORDER — THIAMINE HCL 100 MG PO TABS: 100.0000 mg | ORAL_TABLET | Freq: Every day | ORAL | 1 refills | Status: DC

## 2022-09-13 MED ORDER — CHLORDIAZEPOXIDE HCL 5 MG PO CAPS: 25.0000 mg | ORAL_CAPSULE | Freq: Four times a day (QID) | ORAL | Status: DC | PRN

## 2022-09-13 MED ORDER — CHLORDIAZEPOXIDE HCL 5 MG PO CAPS: 25.0000 mg | ORAL_CAPSULE | Freq: Four times a day (QID) | ORAL | Status: DC

## 2022-09-13 NOTE — Progress Notes (Signed)
Patient ambulated SBA for 500 feet and walked another 250 feet independently. Displayed stability and strength during ambulation.
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
## 2022-09-13 NOTE — Progress Notes (Signed)
CIWA score this am 2 and repeat score at 1200 1. Patient is alert and oriented. When discussing resources patient reports he has resources he needs and support system with his wife at home.

## 2022-11-26 DIAGNOSIS — D649 Anemia, unspecified: Secondary | ICD-10-CM | POA: Insufficient documentation

## 2022-12-10 ENCOUNTER — Encounter (HOSPITAL_COMMUNITY): Payer: Self-pay

## 2022-12-10 ENCOUNTER — Inpatient Hospital Stay (HOSPITAL_COMMUNITY): Payer: Self-pay

## 2022-12-10 ENCOUNTER — Inpatient Hospital Stay (HOSPITAL_COMMUNITY)
Admission: EM | Admit: 2022-12-10 | Discharge: 2022-12-12 | DRG: 894 | Discharge status: Against Medical Advice | Payer: Self-pay | Attending: Internal Medicine | Admitting: Internal Medicine

## 2022-12-10 ENCOUNTER — Other Ambulatory Visit: Payer: Self-pay

## 2022-12-10 DIAGNOSIS — F10939 Alcohol use, unspecified with withdrawal, unspecified: Secondary | ICD-10-CM

## 2022-12-10 DIAGNOSIS — R569 Unspecified convulsions: Secondary | ICD-10-CM | POA: Diagnosis present

## 2022-12-10 DIAGNOSIS — R441 Visual hallucinations: Secondary | ICD-10-CM | POA: Diagnosis present

## 2022-12-10 DIAGNOSIS — F10932 Alcohol use, unspecified with withdrawal with perceptual disturbance: Principal | ICD-10-CM

## 2022-12-10 DIAGNOSIS — R440 Auditory hallucinations: Secondary | ICD-10-CM | POA: Diagnosis present

## 2022-12-10 DIAGNOSIS — Z8659 Personal history of other mental and behavioral disorders: Secondary | ICD-10-CM

## 2022-12-10 DIAGNOSIS — Z1152 Encounter for screening for COVID-19: Secondary | ICD-10-CM

## 2022-12-10 DIAGNOSIS — Z720 Tobacco use: Secondary | ICD-10-CM

## 2022-12-10 DIAGNOSIS — F10951 Alcohol use, unspecified with alcohol-induced psychotic disorder with hallucinations: Secondary | ICD-10-CM

## 2022-12-10 DIAGNOSIS — I10 Essential (primary) hypertension: Secondary | ICD-10-CM | POA: Diagnosis present

## 2022-12-10 DIAGNOSIS — Z79899 Other long term (current) drug therapy: Secondary | ICD-10-CM

## 2022-12-10 DIAGNOSIS — Y904 Blood alcohol level of 80-99 mg/100 ml: Secondary | ICD-10-CM | POA: Diagnosis present

## 2022-12-10 DIAGNOSIS — F10131 Alcohol abuse with withdrawal delirium: Principal | ICD-10-CM | POA: Diagnosis present

## 2022-12-10 DIAGNOSIS — D539 Nutritional anemia, unspecified: Secondary | ICD-10-CM | POA: Diagnosis present

## 2022-12-10 DIAGNOSIS — Z8249 Family history of ischemic heart disease and other diseases of the circulatory system: Secondary | ICD-10-CM

## 2022-12-10 DIAGNOSIS — F10931 Alcohol use, unspecified with withdrawal delirium: Secondary | ICD-10-CM

## 2022-12-10 DIAGNOSIS — F4321 Adjustment disorder with depressed mood: Secondary | ICD-10-CM | POA: Diagnosis present

## 2022-12-10 HISTORY — DX: Alcohol abuse, uncomplicated: F10.10

## 2022-12-10 LAB — COMPREHENSIVE METABOLIC PANEL
ALT: 97 U/L — ABNORMAL HIGH (ref 0–44)
AST: 126 U/L — ABNORMAL HIGH (ref 15–41)
Albumin: 4.1 g/dL (ref 3.5–5.0)
Alkaline Phosphatase: 49 U/L (ref 38–126)
Anion gap: 12 (ref 5–15)
BUN: 15 mg/dL (ref 6–20)
CO2: 24 mmol/L (ref 22–32)
Calcium: 9.6 mg/dL (ref 8.9–10.3)
Chloride: 104 mmol/L (ref 98–111)
Creatinine, Ser: 1.08 mg/dL (ref 0.61–1.24)
GFR, Estimated: >60 mL/min (ref >60)
Glucose, Bld: 84 mg/dL (ref 70–99)
Potassium: 3.6 mmol/L (ref 3.5–5.1)
Sodium: 140 mmol/L (ref 135–145)
Total Bilirubin: 0.7 mg/dL (ref 0.3–1.2)
Total Protein: 6.7 g/dL (ref 6.5–8.1)

## 2022-12-10 LAB — RAPID URINE DRUG SCREEN, HOSP PERFORMED
Amphetamines: NOT DETECTED
Barbiturates: NOT DETECTED
Benzodiazepines: POSITIVE — AB
Cocaine: NOT DETECTED
Opiates: NOT DETECTED
Tetrahydrocannabinol: NOT DETECTED

## 2022-12-10 LAB — CBC WITH DIFFERENTIAL/PLATELET
Abs Immature Granulocytes: 0.01 10*3/uL (ref 0.00–0.07)
Basophils Absolute: 0 10*3/uL (ref 0.0–0.1)
Basophils Relative: 1 %
Eosinophils Absolute: 0 10*3/uL (ref 0.0–0.5)
Eosinophils Relative: 1 %
HCT: 40.1 % (ref 39.0–52.0)
Hemoglobin: 13.4 g/dL (ref 13.0–17.0)
Immature Granulocytes: 0 %
Lymphocytes Relative: 28 %
Lymphs Abs: 1.1 10*3/uL (ref 0.7–4.0)
MCH: 33.5 pg (ref 26.0–34.0)
MCHC: 33.4 g/dL (ref 30.0–36.0)
MCV: 100.3 fL — ABNORMAL HIGH (ref 80.0–100.0)
Monocytes Absolute: 0.4 10*3/uL (ref 0.1–1.0)
Monocytes Relative: 9 %
Neutro Abs: 2.5 10*3/uL (ref 1.7–7.7)
Neutrophils Relative %: 61 %
Platelets: 212 10*3/uL (ref 150–400)
RBC: 4 MIL/uL — ABNORMAL LOW (ref 4.22–5.81)
RDW: 12.7 % (ref 11.5–15.5)
WBC: 4 10*3/uL (ref 4.0–10.5)
nRBC: 0 % (ref 0.0–0.2)

## 2022-12-10 LAB — TROPONIN I (HIGH SENSITIVITY)
Troponin I (High Sensitivity): 4 ng/L (ref <18)
Troponin I (High Sensitivity): 3 ng/L (ref <18)

## 2022-12-10 LAB — TSH: TSH: 0.928 u[IU]/mL (ref 0.350–4.500)

## 2022-12-10 LAB — ETHANOL: Alcohol, Ethyl (B): 86 mg/dL — ABNORMAL HIGH (ref <10)

## 2022-12-10 LAB — MRSA NEXT GEN BY PCR, NASAL: MRSA by PCR Next Gen: NOT DETECTED

## 2022-12-10 LAB — MAGNESIUM: Magnesium: 1.9 mg/dL (ref 1.7–2.4)

## 2022-12-10 LAB — CBG MONITORING, ED: Glucose-Capillary: 96 mg/dL (ref 70–99)

## 2022-12-10 LAB — RESP PANEL BY RT-PCR (RSV, FLU A&B, COVID)  RVPGX2
Influenza A by PCR: NEGATIVE
Influenza B by PCR: NEGATIVE
Resp Syncytial Virus by PCR: NEGATIVE
SARS Coronavirus 2 by RT PCR: NEGATIVE

## 2022-12-10 LAB — LACTIC ACID, PLASMA
Lactic Acid, Venous: 2.8 mmol/L (ref 0.5–1.9)
Lactic Acid, Venous: 2.9 mmol/L (ref 0.5–1.9)

## 2022-12-10 LAB — BRAIN NATRIURETIC PEPTIDE: B Natriuretic Peptide: 15 pg/mL (ref 0.0–100.0)

## 2022-12-10 MED ORDER — DEXMEDETOMIDINE BOLUS VIA INFUSION: 1.0000 ug/kg | Freq: Once | INTRAVENOUS | Status: DC

## 2022-12-10 MED ORDER — THIAMINE HCL 100 MG/ML IJ SOLN
100.0000 mg | Freq: Every day | INTRAMUSCULAR | Status: DC
  Administered 2022-12-10: 100 mg via INTRAVENOUS
  Filled 2022-12-10: qty 2

## 2022-12-10 MED ORDER — PHENOBARBITAL SODIUM 65 MG/ML IJ SOLN: 32.5000 mg | Freq: Three times a day (TID) | INTRAMUSCULAR | Status: DC

## 2022-12-10 MED ORDER — FOLIC ACID 5 MG/ML IJ SOLN
1.0000 mg | Freq: Every day | INTRAMUSCULAR | Status: DC
  Administered 2022-12-10 – 2022-12-11 (×2): 1 mg via INTRAVENOUS
  Filled 2022-12-10 (×2): qty 0.2

## 2022-12-10 MED ORDER — DEXMEDETOMIDINE HCL IN NACL 200 MCG/50ML IV SOLN: 0.0000 ug/kg/h | INTRAVENOUS | Status: DC

## 2022-12-10 MED ORDER — ADULT MULTIVITAMIN LIQUID CH
15.0000 mL | Freq: Every day | ORAL | Status: DC
  Administered 2022-12-10 – 2022-12-11 (×2): 15 mL via ORAL
  Filled 2022-12-10 (×2): qty 15

## 2022-12-10 MED ORDER — LORAZEPAM 2 MG/ML IJ SOLN
1.0000 mg | INTRAMUSCULAR | Status: DC | PRN
  Administered 2022-12-10: 3 mg via INTRAVENOUS
  Administered 2022-12-11: 2 mg via INTRAVENOUS
  Administered 2022-12-11: 4 mg via INTRAVENOUS
  Administered 2022-12-11: 2 mg via INTRAVENOUS
  Administered 2022-12-11: 1 mg via INTRAVENOUS
  Filled 2022-12-10: qty 1
  Filled 2022-12-10: qty 2
  Filled 2022-12-10 (×3): qty 1
  Filled 2022-12-10: qty 2
  Filled 2022-12-10: qty 1

## 2022-12-10 MED ORDER — DIAZEPAM 5 MG/ML IJ SOLN
10.0000 mg | Freq: Once | INTRAMUSCULAR | Status: AC
  Administered 2022-12-10: 10 mg via INTRAVENOUS
  Filled 2022-12-10: qty 2

## 2022-12-10 MED ORDER — LORAZEPAM 1 MG PO TABS: 0.0000 mg | ORAL_TABLET | Freq: Four times a day (QID) | ORAL | Status: DC

## 2022-12-10 MED ORDER — THIAMINE MONONITRATE 100 MG PO TABS: 100.0000 mg | ORAL_TABLET | Freq: Every day | ORAL | Status: DC

## 2022-12-10 MED ORDER — LORAZEPAM 1 MG PO TABS: 1.0000 mg | ORAL_TABLET | ORAL | Status: DC | PRN

## 2022-12-10 MED ORDER — ORAL CARE MOUTH RINSE
15.0000 mL | OROMUCOSAL | Status: DC
  Administered 2022-12-10: 15 mL via OROMUCOSAL

## 2022-12-10 MED ORDER — PHENOBARBITAL SODIUM 65 MG/ML IJ SOLN: 65.0000 mg | Freq: Three times a day (TID) | INTRAMUSCULAR | Status: DC

## 2022-12-10 MED ORDER — LACTATED RINGERS IV BOLUS
1000.0000 mL | Freq: Once | INTRAVENOUS | Status: AC
  Administered 2022-12-10: 1000 mL via INTRAVENOUS

## 2022-12-10 MED ORDER — CHLORHEXIDINE GLUCONATE CLOTH 2 % EX PADS
6.0000 | MEDICATED_PAD | Freq: Every day | CUTANEOUS | Status: DC
  Administered 2022-12-10 – 2022-12-11 (×2): 6 via TOPICAL

## 2022-12-10 MED ORDER — CHLORHEXIDINE GLUCONATE 0.12% ORAL RINSE (MEDLINE KIT): 15.0000 mL | Freq: Two times a day (BID) | OROMUCOSAL | Status: DC

## 2022-12-10 MED ORDER — LORAZEPAM 1 MG PO TABS
2.0000 mg | ORAL_TABLET | Freq: Four times a day (QID) | ORAL | Status: DC
  Administered 2022-12-10: 2 mg via ORAL
  Filled 2022-12-10: qty 2

## 2022-12-10 MED ORDER — INFLUENZA VAC SPLIT QUAD 0.5 ML IM SUSY: 0.5000 mL | PREFILLED_SYRINGE | INTRAMUSCULAR | Status: DC | PRN

## 2022-12-10 MED ORDER — LEVETIRACETAM IN NACL 500 MG/100ML IV SOLN
500.0000 mg | Freq: Two times a day (BID) | INTRAVENOUS | Status: DC
  Administered 2022-12-10 – 2022-12-11 (×3): 500 mg via INTRAVENOUS
  Filled 2022-12-10 (×4): qty 100

## 2022-12-10 MED ORDER — ONDANSETRON HCL 4 MG PO TABS
4.0000 mg | ORAL_TABLET | Freq: Three times a day (TID) | ORAL | Status: DC | PRN
  Administered 2022-12-10: 4 mg via ORAL
  Filled 2022-12-10 (×2): qty 1

## 2022-12-10 MED ORDER — SODIUM CHLORIDE 0.9 % IV SOLN
260.0000 mg | Freq: Once | INTRAVENOUS | Status: AC
  Administered 2022-12-10: 260 mg via INTRAVENOUS
  Filled 2022-12-10: qty 2

## 2022-12-10 MED ORDER — PHENOBARBITAL SODIUM 130 MG/ML IJ SOLN
97.5000 mg | Freq: Three times a day (TID) | INTRAMUSCULAR | Status: DC
  Administered 2022-12-10 – 2022-12-11 (×4): 97.5 mg via INTRAVENOUS
  Filled 2022-12-10 (×4): qty 1

## 2022-12-10 MED ORDER — ENOXAPARIN SODIUM 40 MG/0.4ML IJ SOSY
40.0000 mg | PREFILLED_SYRINGE | Freq: Every day | INTRAMUSCULAR | Status: DC
  Administered 2022-12-10 – 2022-12-11 (×2): 40 mg via SUBCUTANEOUS
  Filled 2022-12-10 (×2): qty 0.4

## 2022-12-10 MED ORDER — HYDRALAZINE HCL 20 MG/ML IJ SOLN: 10.0000 mg | INTRAMUSCULAR | Status: DC | PRN

## 2022-12-10 MED ORDER — THIAMINE HCL 100 MG/ML IJ SOLN
100.0000 mg | Freq: Every day | INTRAMUSCULAR | Status: DC
  Administered 2022-12-11: 100 mg via INTRAVENOUS
  Filled 2022-12-10 (×2): qty 2

## 2022-12-10 MED ORDER — ORAL CARE MOUTH RINSE: 15.0000 mL | OROMUCOSAL | Status: DC | PRN

## 2022-12-10 MED ORDER — ENOXAPARIN SODIUM 40 MG/0.4ML IJ SOSY: 40.0000 mg | PREFILLED_SYRINGE | INTRAMUSCULAR | Status: DC

## 2022-12-10 NOTE — ED Provider Notes (Signed)
[place] EMERGENCY DEPARTMENT AT [facility] Provider Note   CSN: [id number] Arrival date & time: 12/10/22  1120     History  Chief Complaint  Patient presents with   Seizures    Adam [name]. is a 37 y.o. male.   Seizures     Patient with medical history of chronic alcohol abuse complicated by previous withdrawal seizures, DTs, hypertension presents to the emergency department due to alcohol withdrawal seizure.  Patient states he has been trying to abstain from alcohol.  He drank 1/5 a day up until 2 days ago where he cut back, yesterday was his last drink of alcohol which was 1 shot of hard liquor.  He has been feeling agitated, anxious and tremulous.  He is nauseated and vomiting but no diarrhea.  He states he is seeing shadows and hearing voices.  Also reportedly had a seizure prior to arrival, drove himself to the ED where he had a witnessed seizure on FaceTime with his wife in the parking lot.  Lasted less than 5 minutes, did not hit head or lose consciousness.  Denies any other drug use.  Patient is states he is determined to become sober as he has a new 60-month-old and his wife leave him if he does not stop drinking alcohol.  Home Medications Prior to Admission medications   Medication Sig Start Date End Date Taking? Authorizing Provider  chlordiazePOXIDE (LIBRIUM) 25 MG capsule Take 1 capsule three times a day (AM-noon-PM) for 2 days, then taper to 25 mg (1 capsule) 2 times a day (AM and PM) for 1 day, then 25mg  (1 cap) once in the morning, then off 09/16/22   Rai, Ripudeep K, MD  folic acid (FOLVITE) 1 MG tablet Take 1 tablet (1 mg total) by mouth daily. 09/13/22   Rai, Vernelle Emerald, MD  gabapentin (NEURONTIN) 300 MG capsule Take 1 capsule (300 mg total) by mouth 2 (two) times daily. 09/13/22 10/13/22  Rai, Vernelle Emerald, MD  levETIRAcetam (KEPPRA) 500 MG tablet Take 1 tablet (500 mg total) by mouth 2 (two) times daily. 09/13/22   Rai, Ripudeep K, MD   lisinopril (ZESTRIL) 20 MG tablet Take 1 tablet (20 mg total) by mouth daily. 09/13/22   Rai, Vernelle Emerald, MD  mupirocin cream (BACTROBAN) 2 % Apply topically 2 (two) times daily. Apply to fingers/hand. Cream/Ointment or generic whichever is available. 09/13/22   Rai, Vernelle Emerald, MD  NONFORMULARY OR COMPOUNDED ITEM Please provide 1 week supply for the following dressing in his hands. Cover with single layer of xeroform (non adherent dressing). Wrap with conform gauze (1" or 2") change every other day 09/02/22   Thurnell Lose, MD  thiamine (VITAMIN B1) 100 MG tablet Take 1 tablet (100 mg total) by mouth daily. 09/13/22   Mendel Corning, MD      Allergies    Bee venom    Review of Systems   Review of Systems  Neurological:  Positive for seizures.    Physical Exam Updated Vital Signs BP (!) 137/93   Pulse 81   Temp 98.3 F (36.8 C) (Oral)   Resp 18   SpO2 98%  Physical Exam Vitals and nursing note reviewed. Exam conducted with a chaperone present.  Constitutional:      Appearance: Normal appearance. He is ill-appearing.  HENT:     Head: Normocephalic and atraumatic.     Comments: No head trauma, no periorbital ecchymosis, Battle sign    Mouth/Throat:  Comments: Superficial abrasion left lower lip Eyes:     General: No scleral icterus.       Right eye: No discharge.        Left eye: No discharge.     Extraocular Movements: Extraocular movements intact.     Pupils: Pupils are equal, round, and reactive to light.  Cardiovascular:     Rate and Rhythm: Regular rhythm. Tachycardia present.     Pulses: Normal pulses.     Heart sounds: Normal heart sounds.     No friction rub. No gallop.  Pulmonary:     Effort: Pulmonary effort is normal. No respiratory distress.     Breath sounds: Normal breath sounds.  Abdominal:     General: Abdomen is flat. Bowel sounds are normal. There is no distension.     Palpations: Abdomen is soft.     Tenderness: There is no abdominal  tenderness.  Musculoskeletal:     Cervical back: No tenderness.  Skin:    General: Skin is warm and dry.     Coloration: Skin is not jaundiced.  Neurological:     Mental Status: He is alert. Mental status is at baseline.     Coordination: Coordination normal.     Comments: Tremulous, ANO x 3.  Follows commands, upper and lower extremity strength symmetric bilaterally.  Cranial nerves II to XII are grossly intact.     ED Results / Procedures / Treatments   Labs (all labs ordered are listed, but only abnormal results are displayed) Labs Reviewed  COMPREHENSIVE METABOLIC PANEL - Abnormal; Notable for the following components:      Result Value   AST 126 (*)    ALT 97 (*)    All other components within normal limits  CBC WITH DIFFERENTIAL/PLATELET - Abnormal; Notable for the following components:   RBC 4.00 (*)    MCV 100.3 (*)    All other components within normal limits  ETHANOL - Abnormal; Notable for the following components:   Alcohol, Ethyl (B) 86 (*)    All other components within normal limits  LACTIC ACID, PLASMA - Abnormal; Notable for the following components:   Lactic Acid, Venous 2.8 (*)    All other components within normal limits  RESP PANEL BY RT-PCR (RSV, FLU A&B, COVID)  RVPGX2  MAGNESIUM  RAPID URINE DRUG SCREEN, HOSP PERFORMED  LACTIC ACID, PLASMA  CBG MONITORING, ED  CBG MONITORING, ED    EKG None  Radiology No results found.  Procedures .Critical Care  Performed by: Theron Arista, PA-C Authorized by: Theron Arista, PA-C   Critical care provider statement:    Critical care time (minutes):  30   Critical care start time:  12/10/2022 2:22 AM   Critical care end time:  12/10/2022 2:52 PM   Critical care time was exclusive of:  Separately billable procedures and treating other patients   Critical care was necessary to treat or prevent imminent or life-threatening deterioration of the following conditions: Alcohol withdrawal.   Critical care was time  spent personally by me on the following activities:  Development of treatment plan with patient or surrogate, discussions with consultants, evaluation of patient's response to treatment, examination of patient, ordering and review of laboratory studies, ordering and review of radiographic studies, ordering and performing treatments and interventions, pulse oximetry, re-evaluation of patient's condition and review of old charts   Care discussed with: admitting provider       Medications Ordered in ED Medications  ondansetron (ZOFRAN) tablet 4  mg (4 mg Oral Given 12/10/22 1357)  thiamine (VITAMIN B1) tablet 100 mg ( Oral See Alternative 12/10/22 1202)    Or  thiamine (VITAMIN B1) injection 100 mg (100 mg Intravenous Given 12/10/22 1202)  LORazepam (ATIVAN) tablet 2 mg (2 mg Oral Given 12/10/22 1353)    Or  LORazepam (ATIVAN) tablet 0-4 mg ( Oral See Alternative 12/10/22 1353)  dexmedetomidine (PRECEDEX) bolus via infusion 1 mcg/kg (has no administration in time range)  diazepam (VALIUM) injection 10 mg (10 mg Intravenous Given 12/10/22 1203)  lactated ringers bolus 1,000 mL (0 mLs Intravenous Stopped 12/10/22 1302)  lactated ringers bolus 1,000 mL (1,000 mLs Intravenous New Bag/Given 12/10/22 1358)    ED Course/ Medical Decision Making/ A&P Clinical Course as of 12/10/22 1452  Fri Dec 10, 2022  1353 CBC with Differential/Platelet(!) No leukocytosis or anemia [HS]  1353 Comprehensive metabolic panel(!) Mild transaminitis AST elevated 126 and LDL 97 not grossly changed compared to previous.  No AKI [HS]  1353 Ethanol(!) Ethanol is elevated at 86, higher than I would expect if his last drink was really just 1 2 oz shot of liquor yesterday. [HS]  1354 Magnesium wnl [HS]  1354 Lactic acid, plasma(!!) Elevated  [HS]    Clinical Course User Index [HS] Sherrill Raring, PA-C                             Medical Decision Making Amount and/or Complexity of Data Reviewed External Data Reviewed:  notes.    Details: Reviewed previous ED visits and laboratory workup.  Patient has complicated history of alcohol withdrawal including seizures and history of DTs. Labs: ordered. Decision-making details documented in ED Course. ECG/medicine tests: ordered. Decision-making details documented in ED Course.  Risk OTC drugs. Prescription drug management. Decision regarding hospitalization.   Patient presents to the ED due to seizures.  Given history of alcohol use disorder and recent cessation of alcohol I have suspicion for alcohol withdrawal seizure.  DTs, hallucinations, alcohol withdrawal complications are also in the differential.  I also considered AKI, metabolic abnormality, dehydration, infection.  Patient has had 3 seizures in the last 12 hours, concern for complicated alcohol withdrawal with seizures. CIWA protocol initiated, fluids and thiamine ordered.   Patient needs admission for alcohol withdrawal.  I consulted with the hospitalist and spoke with Dr. Clydene Laming who agrees with admission.  Requesting UDS, COVID and Precedex drip which I have initiated.        Final Clinical Impression(s) / ED Diagnoses Final diagnoses:  Alcohol withdrawal seizure with perceptual disturbance [facility])    Rx / DC Orders ED Discharge Orders     None         Sherrill Raring, PA-C 12/10/22 1452    Dorie Rank, MD 12/11/22 [phone]

## 2022-12-10 NOTE — H&P (Signed)
[facility] History and Physical  Adam [name] [employer]. BMW:[id number] DOB: 1986-08-08 DOA: 12/10/2022  Referring physician:  PCP: Patient, No Pcp Per   Chief Complaint: EtOH withdrawal  HPI: Adam [name]. is a 37 y.o. male A/O x 4, positive visual hallucination,  negative auditory hallucination, positive shakes, negative nausea, negative vomiting.  Confirms drinks 1/5 bottles of EtOH per day.  Confirms last drink~2 days ago.  Positive EtOH= 86, positive benzodiazepine (sample possibly obtain after treated for seizure and ED).  Patient positive to seizures sitting in his car and parking lot, and 1 witnessed seizure in the ED, per RN negative postictal   Review of Systems:  Covid vaccination;  Constitutional:  No weight loss, night sweats, Fevers, chills, fatigue.  HEENT:  No headaches, Difficulty swallowing,Tooth/dental problems,Sore throat,  No sneezing, itching, ear ache, nasal congestion, post nasal drip,  Cardio-vascular:  No chest pain, Orthopnea, PND, swelling in lower extremities, anasarca, dizziness, palpitations  GI:  No heartburn, indigestion, abdominal pain, nausea, vomiting, diarrhea, change in bowel habits, loss of appetite  Resp:  No shortness of breath with exertion or at rest. No excess mucus, no productive cough, No non-productive cough, No coughing up of blood.No change in color of mucus.No wheezing.No chest wall deformity  Skin:  no rash or lesions.  GU:  no dysuria, change in color of urine, no urgency or frequency. No flank pain.  Musculoskeletal:  No joint pain or swelling. No decreased range of motion. No back pain.  Psych:  No change in mood or affect. No depression or anxiety. No memory loss.  Visual hallucinations, tremors, auditory hallucinations have ceased.  Past Medical History:  Diagnosis Date   Alcohol abuse    Eating disorder    history of bulemia   HYPERTENSION 06/25/2010   Seizure ([place])    No past surgical history on  file. Social History:  reports that he has quit smoking. He uses smokeless tobacco. He reports current alcohol use of about 70.0 standard drinks of alcohol per week. He reports that he does not currently use drugs.  Allergies  Allergen Reactions   Bee Venom     Family History  Problem Relation Age of Onset   Cancer Mother        lung   Hypertension Father     Prior to Admission medications   Medication Sig Start Date End Date Taking? Authorizing Provider  chlordiazePOXIDE (LIBRIUM) 25 MG capsule Take 1 capsule three times a day (AM-noon-PM) for 2 days, then taper to 25 mg (1 capsule) 2 times a day (AM and PM) for 1 day, then 25mg  (1 cap) once in the morning, then off 09/16/22   Rai, Ripudeep K, MD  folic acid (FOLVITE) 1 MG tablet Take 1 tablet (1 mg total) by mouth daily. 09/13/22   Rai, Vernelle Emerald, MD  gabapentin (NEURONTIN) 300 MG capsule Take 1 capsule (300 mg total) by mouth 2 (two) times daily. 09/13/22 10/13/22  Rai, Vernelle Emerald, MD  levETIRAcetam (KEPPRA) 500 MG tablet Take 1 tablet (500 mg total) by mouth 2 (two) times daily. 09/13/22   Rai, Ripudeep K, MD  lisinopril (ZESTRIL) 20 MG tablet Take 1 tablet (20 mg total) by mouth daily. 09/13/22   Rai, Vernelle Emerald, MD  mupirocin cream (BACTROBAN) 2 % Apply topically 2 (two) times daily. Apply to fingers/hand. Cream/Ointment or generic whichever is available. 09/13/22   Rai, Vernelle Emerald, MD  NONFORMULARY OR COMPOUNDED ITEM Please provide 1 week supply for the following  dressing in his hands. Cover with single layer of xeroform (non adherent dressing). Wrap with conform gauze (1" or 2") change every other day 09/02/22   Leroy Sea, MD  thiamine (VITAMIN B1) 100 MG tablet Take 1 tablet (100 mg total) by mouth daily. 09/13/22   Cathren Harsh, MD     Consultants:  PCCM   Procedures/Significant Events:  Head CT pending    I have personally reviewed and interpreted all radiology studies and my findings are as  above.   VENTILATOR SETTINGS:    Cultures   Antimicrobials:    Devices    LINES / TUBES:      Continuous Infusions:  Physical Exam: Vitals:   12/10/22 1430 12/10/22 1501 12/10/22 1520 12/10/22 1530  BP: (!) 137/93   116/77  Pulse: 81   82  Resp: 18   18  Temp:   98.5 F (36.9 C)   TempSrc:      SpO2: 98%   96%  Weight:  76.5 kg    Height:  5\' 7"  (1.702 m)      Wt Readings from Last 3 Encounters:  12/10/22 76.5 kg  09/12/22 76.6 kg  05/24/22 74.8 kg    General: A/O times No acute respiratory distress Eyes: negative scleral hemorrhage, negative anisocoria, negative icterus ENT: Negative Runny nose, negative gingival bleeding, Neck:  Negative scars, masses, torticollis, lymphadenopathy, JVD Lungs: Clear to auscultation bilaterally without wheezes or crackles Cardiovascular: Tachycardic without murmur gallop or rub normal S1 and S2 Abdomen: negative abdominal pain, nondistended, positive soft, bowel sounds, no rebound, no ascites, no appreciable mass Extremities: No significant cyanosis, clubbing, or edema bilateral lower extremities Skin: Negative rashes, lesions, ulcers Psychiatric:  Negative depression, negative anxiety, negative fatigue, negative mania, visual hallucinations Central nervous system:  Cranial nerves II through XII intact, tongue/uvula midline, all extremities muscle strength 5/5, sensation intact throughout, negative dysarthria, negative expressive aphasia, negative receptive aphasia.  Tremors        Labs on Admission:  Basic Metabolic Panel: Recent Labs  Lab 12/10/22 1142  NA 140  K 3.6  CL 104  CO2 24  GLUCOSE 84  BUN 15  CREATININE 1.08  CALCIUM 9.6  MG 1.9   Liver Function Tests: Recent Labs  Lab 12/10/22 1142  AST 126*  ALT 97*  ALKPHOS 49  BILITOT 0.7  PROT 6.7  ALBUMIN 4.1   No results for input(s): "LIPASE", "AMYLASE" in the last 168 hours. No results for input(s): "AMMONIA" in the last 168  hours. CBC: Recent Labs  Lab 12/10/22 1142  WBC 4.0  NEUTROABS 2.5  HGB 13.4  HCT 40.1  MCV 100.3*  PLT 212   Cardiac Enzymes: No results for input(s): "CKTOTAL", "CKMB", "CKMBINDEX", "TROPONINI" in the last 168 hours.  BNP (last 3 results) Recent Labs    09/01/22 0248 09/02/22 0309  BNP 109.9* 45.4    ProBNP (last 3 results) No results for input(s): "PROBNP" in the last 8760 hours.  CBG: Recent Labs  Lab 12/10/22 1139  GLUCAP 96    Radiological Exams on Admission: CT HEAD WO CONTRAST (12/12/22)  Result Date: 12/10/2022 CLINICAL DATA:  Head trauma, focal neuro findings.  Seizures. EXAM: CT HEAD WITHOUT CONTRAST TECHNIQUE: Contiguous axial images were obtained from the base of the skull through the vertex without intravenous contrast. RADIATION DOSE REDUCTION: This exam was performed according to the departmental dose-optimization program which includes automated exposure control, adjustment of the mA and/or kV according to patient size and/or use  of iterative reconstruction technique. COMPARISON:  Head CT 09/12/2022. FINDINGS: Brain: No acute hemorrhage, mass effect or midline shift. Gray-white differentiation is preserved. No hydrocephalus. No extra-axial collection. Basilar cisterns are patent. Vascular: No hyperdense vessel or unexpected calcification. Skull: No calvarial fracture or suspicious bone lesion. Skull base is unremarkable. Sinuses/Orbits: Paranasal sinuses, mastoid air cells, and middle ear cavities are well aerated. Orbits are unremarkable. Other: None. IMPRESSION: No acute intracranial abnormality. Electronically Signed   By: Emmit Alexanders M.D.   On: 12/10/2022 15:52    EKG: NSR, possible left anterior septal infarct (unlikely)    Assessment/Plan Principal Problem:   Alcohol withdrawal delirium (HCC) Active Problems:   Alcohol withdrawal seizure ([place])   Acute alcoholic hallucinosis (HCC)   Essential hypertension   Adjustment disorder with depressed mood    Seizure ([place])  EtOH withdrawal delirium - Patient having auditory hallucinations, tremors, seizures. - Placed on Precedex RASS score -2.  Counseled patient this would be the safest way to get him through his withdrawal and he concurs. -Folic acid 1 mg daily - Thiamine 100 mg daily - PCCM notified.  Discussed with Dr.Mannam Praveen - COVID, RSV, influenza A/B panel pending  Essential HTN - Currently controlled without BP medication, and given patient starting Precedex will hold on any additional medication at this time.  Adjustment disorder with depressed mood - Per MAR does not appear to be on medication  Seizures - Per patient's MAR on Keppra 500 mg BID -Keppra level pending - Keppra IV 500 mg BID    Mobility Assessment (last 72 hours)     Mobility Assessment   No documentation.           Code Status:   (DVT Prophylaxis: Lovenox Family Communication:   Status is: Inpatient    Dispo: The patient is from: Home              Anticipated d/c is to: Home              Anticipated d/c date is: > 3 days              Patient currently is not medically stable to d/c.     Data Reviewed: Care during the described time interval was provided by me .  I have reviewed this patient's available data, including medical history, events of note, physical examination, and all test results as part of my evaluation.   The patient is critically ill with multiple organ systems failure and requires high complexity decision making for assessment and support, frequent evaluation and titration of therapies, application of advanced monitoring technologies and extensive interpretation of multiple databases. Critical Care Time devoted to patient care services described in this note  Time spent: 70 minutes   Adam [name], [facility]

## 2022-12-10 NOTE — ED Triage Notes (Signed)
BIB EMS for seizures. Detoxing from alcohol for about 24 hours. Pt also endorses audio visual hallucinations. Pt also had a seizure with EMS 170/97 BP 98 HR 100% 101 cbg 20g left hand

## 2022-12-10 NOTE — ED Notes (Signed)
Patient transported to CT 

## 2022-12-10 NOTE — Plan of Care (Signed)
Discussed with patient plan of care for the evening, pain management and alcohol withdrawl signs and symptoms with some teach back displayed.  Problem: Education: Goal: Expressions of having a comfortable level of knowledge regarding the disease process will increase Outcome: Progressing   Problem: Coping: Goal: Ability to adjust to condition or change in health will improve Outcome: Progressing   Problem: Health Behavior/Discharge Planning: Goal: Compliance with prescribed medication regimen will improve Outcome: Progressing

## 2022-12-10 NOTE — Progress Notes (Signed)
Discussed with PCCM RN, if Precedex needed to started regardless and the verified with MD not at this time.  CN aware as well.  Patient is currently alert and oriented to person, place, time and situation.  He is able to follow commands and answer questions an estimated CIWA will probably have him under 20 and probably 11 to 15 scale, recheck due at 2300-0000 tonight.  He currently is getting his scheduled Phenobarbital now.

## 2022-12-10 NOTE — ED Notes (Signed)
Pt states he thinks he might have had another seizure. Small amount of vomit on the sheet, and pt has some swelling on the right lower lip from an apparent bite

## 2022-12-10 NOTE — Consult Note (Signed)
NAME:  Adam [name]., MRN:  [id number], DOB:  10/12/1986, LOS: 0 ADMISSION DATE:  12/10/2022, CONSULTATION DATE:  12/10/2022 REFERRING MD:  Sherral Hammers, CHIEF COMPLAINT:  ETOH withdrawal    History of Present Illness:  Adam [name]. Adam Jr. Is a 37 yo male with a PMH of alcohol abuse with withdrawal seizures, Dts, and HTN. Pt drove himself to the [facility] after having a seizure, and while in the parking lot, had another seizure that was witnessed by his wife on FaceTime. Seizure reportedly lasted for approx 42min. Of note, patient has been trying to abstain from ETOH and admits to drinking 1/5 a day up until two days ago where he cut back to just drinking 1 shot of liquor to help with his tremor.    In the ED patient had his third seizure within the last 12 hrs. ED workup reveled AST 126, ALT 97. CBC consistent with macrocytic anemia. ETOH 86, LA 2.8. RVP negative, and UDS positive for benzo's but was administered in the ED and EMS. CT Head resulted with NAICA. He reported having tremor, visual and auditory hallucinations.   PCCM consulted for further management for ETOH withdrawal.     Pertinent  Medical History  ETOH abuse, ETOH withdrawal seizures, HTN, DT's  Significant Hospital Events: Including procedures, antibiotic start and stop dates in addition to other pertinent events   12/10/2022 - Admitted for ETOH withdrawal seizures   Interim History / Subjective:  Pt resting in the bed, not complaining of any pain. He does admit to visual hallucinations at this time. He admits to withdrawing from alcohol only as an attempt to get his life back together since he has a new 73-month-old and his wife told him she would leave him if he did not stop drinking alcohol.   Pt asking for food, reports feeling hungry.    Objective   Blood pressure (!) 151/108, pulse 87, temperature 98.5 F (36.9 C), resp. rate 16, height 5\' 7"  (1.702 m), weight 76.5 kg, SpO2 98 %.        Intake/Output Summary (Last  24 hours) at 12/10/2022 1628 Last data filed at 12/10/2022 1502 Gross per 24 hour  Intake 1999 ml  Output --  Net 1999 ml   Filed Weights   12/10/22 1501  Weight: 76.5 kg   Physical Examination: General: Acutely ill-appearing male in NAD. HEENT: [place]/AT, anicteric sclera, PERRL, moist mucous membranes. Neuro: Awake, oriented x 4. Responds to verbal stimuli. Following commands consistently. Moves all 4 extremities spontaneously.  CV: RRR, no m/g/r. PULM: Breathing even and unlabored on RA. Lung fields clear. GI: Soft, nontender, nondistended. Normoactive bowel sounds. Extremities: no LE edema noted. Skin: Warm/dry, no rashes. Multiple tattoos.  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 Patient updated on plan of care 1/19 and in agreement.   Labs   CBC: Recent Labs  Lab 12/10/22 1142  WBC 4.0  NEUTROABS 2.5  HGB 13.4  HCT 40.1  MCV 100.3*  PLT 212    Basic Metabolic Panel: Recent Labs  Lab 12/10/22 1142  NA 140  K 3.6  CL 104  CO2 24  GLUCOSE 84  BUN 15  CREATININE 1.08  CALCIUM 9.6  MG 1.9   GFR: Estimated Creatinine  Clearance: 90.1 mL/min (by C-G formula based on SCr of 1.08 mg/dL). Recent Labs  Lab 12/10/22 1142 12/10/22 1148 12/10/22 1402  WBC 4.0  --   --   LATICACIDVEN  --  2.8* 2.9*    Liver Function Tests: Recent Labs  Lab 12/10/22 1142  AST 126*  ALT 97*  ALKPHOS 49  BILITOT 0.7  PROT 6.7  ALBUMIN 4.1   No results for input(s): "LIPASE", "AMYLASE" in the last 168 hours. No results for input(s): "AMMONIA" in the last 168 hours.  ABG    Component Value Date/Time   TCO2 24 04/28/2019 0333     Coagulation Profile: No results for input(s): "INR", "PROTIME" in the last 168 hours.  Cardiac Enzymes: No results for input(s): "CKTOTAL", "CKMB", "CKMBINDEX", "TROPONINI" in the last 168 hours.  HbA1C: No results found for: "HGBA1C"  CBG: Recent Labs  Lab 12/10/22 1139  GLUCAP 96    Review of Systems:   Review of Systems  Eyes:  Negative for blurred vision.  Respiratory:  Negative for cough.   Cardiovascular:  Negative for chest pain and palpitations.  Gastrointestinal:  Positive for nausea. Negative for vomiting.  Skin:  Negative for itching and rash.  Neurological:  Positive for tremors. Negative for headaches.  Psychiatric/Behavioral:  Positive for hallucinations.    Past Medical History:  He,  has a past medical history of Alcohol abuse, Eating disorder, HYPERTENSION (06/25/2010), and Seizure (HCC).   Surgical History:  No past surgical history on file.   Social History:   reports that he has quit smoking. He uses smokeless tobacco. He reports current alcohol use of about 70.0 standard drinks of alcohol per week. He reports that he does not currently use drugs.   Family History:  His family history includes Cancer in his mother; Hypertension in his father.   Allergies Allergies  Allergen Reactions   Bee Venom      Home Medications  Prior to Admission medications   Medication Sig Start Date End Date Taking? Authorizing Provider  chlordiazePOXIDE (LIBRIUM)  25 MG capsule Take 1 capsule three times a day (AM-noon-PM) for 2 days, then taper to 25 mg (1 capsule) 2 times a day (AM and PM) for 1 day, then 25mg  (1 cap) once in the morning, then off 09/16/22   Rai, Ripudeep K, MD  folic acid (FOLVITE) 1 MG tablet Take 1 tablet (1 mg total) by mouth daily. 09/13/22   Rai, 09/15/22, MD  gabapentin (NEURONTIN) 300 MG capsule Take 1 capsule (300 mg total) by mouth 2 (two) times daily. 09/13/22 10/13/22  Rai, 10/15/22, MD  levETIRAcetam (KEPPRA) 500 MG tablet Take 1 tablet (500 mg total) by mouth 2 (two) times daily. 09/13/22   Rai, Ripudeep K, MD  lisinopril (ZESTRIL) 20 MG tablet Take 1 tablet (20 mg total) by mouth daily. 09/13/22   Rai, Ripudeep  K, MD  mupirocin cream (BACTROBAN) 2 % Apply topically 2 (two) times daily. Apply to fingers/hand. Cream/Ointment or generic whichever is available. 09/13/22   Rai, Vernelle Emerald, MD  NONFORMULARY OR COMPOUNDED ITEM Please provide 1 week supply for the following dressing in his hands. Cover with single layer of xeroform (non adherent dressing). Wrap with conform gauze (1" or 2") change every other day 09/02/22   Thurnell Lose, MD  thiamine (VITAMIN B1) 100 MG tablet Take 1 tablet (100 mg total) by mouth daily. 09/13/22   Mendel Corning, MD     Critical care time:     Erma Heritage, NP-S  Cct deferred to Attending

## 2022-12-11 LAB — CBC WITH DIFFERENTIAL/PLATELET
Abs Immature Granulocytes: 0.01 10*3/uL (ref 0.00–0.07)
Basophils Absolute: 0 10*3/uL (ref 0.0–0.1)
Basophils Relative: 1 %
Eosinophils Absolute: 0.1 10*3/uL (ref 0.0–0.5)
Eosinophils Relative: 2 %
HCT: 38.4 % — ABNORMAL LOW (ref 39.0–52.0)
Hemoglobin: 12.8 g/dL — ABNORMAL LOW (ref 13.0–17.0)
Immature Granulocytes: 0 %
Lymphocytes Relative: 37 %
Lymphs Abs: 1.1 10*3/uL (ref 0.7–4.0)
MCH: 33.9 pg (ref 26.0–34.0)
MCHC: 33.3 g/dL (ref 30.0–36.0)
MCV: 101.6 fL — ABNORMAL HIGH (ref 80.0–100.0)
Monocytes Absolute: 0.3 10*3/uL (ref 0.1–1.0)
Monocytes Relative: 9 %
Neutro Abs: 1.5 10*3/uL — ABNORMAL LOW (ref 1.7–7.7)
Neutrophils Relative %: 51 %
Platelets: 163 10*3/uL (ref 150–400)
RBC: 3.78 MIL/uL — ABNORMAL LOW (ref 4.22–5.81)
RDW: 12.6 % (ref 11.5–15.5)
WBC: 2.9 10*3/uL — ABNORMAL LOW (ref 4.0–10.5)
nRBC: 0 % (ref 0.0–0.2)

## 2022-12-11 LAB — MAGNESIUM: Magnesium: 1.7 mg/dL (ref 1.7–2.4)

## 2022-12-11 LAB — COMPREHENSIVE METABOLIC PANEL
ALT: 83 U/L — ABNORMAL HIGH (ref 0–44)
AST: 96 U/L — ABNORMAL HIGH (ref 15–41)
Albumin: 3.9 g/dL (ref 3.5–5.0)
Alkaline Phosphatase: 49 U/L (ref 38–126)
Anion gap: 10 (ref 5–15)
BUN: 12 mg/dL (ref 6–20)
CO2: 25 mmol/L (ref 22–32)
Calcium: 8.9 mg/dL (ref 8.9–10.3)
Chloride: 103 mmol/L (ref 98–111)
Creatinine, Ser: 1 mg/dL (ref 0.61–1.24)
GFR, Estimated: >60 mL/min (ref >60)
Glucose, Bld: 76 mg/dL (ref 70–99)
Potassium: 3.6 mmol/L (ref 3.5–5.1)
Sodium: 138 mmol/L (ref 135–145)
Total Bilirubin: 1.1 mg/dL (ref 0.3–1.2)
Total Protein: 6.4 g/dL — ABNORMAL LOW (ref 6.5–8.1)

## 2022-12-11 LAB — PHOSPHORUS: Phosphorus: 3.1 mg/dL (ref 2.5–4.6)

## 2022-12-11 MED ORDER — GUAIFENESIN-DM 100-10 MG/5ML PO SYRP
5.0000 mL | ORAL_SOLUTION | ORAL | Status: DC | PRN
  Filled 2022-12-11: qty 10

## 2022-12-11 MED ORDER — PHENOL 1.4 % MT LIQD
1.0000 | OROMUCOSAL | Status: DC | PRN
  Filled 2022-12-11: qty 177

## 2022-12-11 MED ORDER — FOLIC ACID 1 MG PO TABS: 1.0000 mg | ORAL_TABLET | Freq: Every day | ORAL | Status: DC

## 2022-12-11 MED ORDER — GUAIFENESIN 200 MG PO TABS: 200.0000 mg | ORAL_TABLET | ORAL | Status: DC | PRN

## 2022-12-11 MED ORDER — ADULT MULTIVITAMIN W/MINERALS CH: 1.0000 | ORAL_TABLET | Freq: Every day | ORAL | Status: DC

## 2022-12-11 MED ORDER — THIAMINE MONONITRATE 100 MG PO TABS: 100.0000 mg | ORAL_TABLET | Freq: Every day | ORAL | Status: DC

## 2022-12-11 NOTE — Progress Notes (Signed)
PROGRESS NOTE    Adam [name] [employer].  DC:[id number] DOB: February 28, 1986 DOA: 12/10/2022 PCP: Patient, No Pcp Per   Brief Narrative:  Adam [name] Mcilvain [name] is a 37 y.o. WM PMHx EtOH abuse, complicated by previous withdrawal seizures, DTs, HTN.  Patient states he has been trying to abstain from alcohol. He drank 1/5 a day up until 2 days ago where he cut back, yesterday was his last drink of alcohol which was 1 shot of hard liquor. He has been feeling agitated, anxious and tremulous. He is nauseated and vomiting but no diarrhea. He states he is seeing shadows and hearing voices. Also reportedly had a seizure prior to arrival, drove himself to the ED where he had a witnessed seizure on FaceTime with his wife in the parking lot. Lasted less than 5 minutes, did not hit head or lose consciousness. Denies any other drug use. Patient is states he is determined to become sober as he has a new 5-month-old and his wife leave him if he does not stop drinking alcohol.   Upon admission A/O x 4, positive visual hallucination,  negative auditory hallucination, positive shakes, negative nausea, negative vomiting.  Confirms drinks 1/5 bottles of EtOH per day.  Confirms last drink~2 days ago.  Positive EtOH= 86, positive benzodiazepine (sample possibly obtain after treated for seizure and ED).  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 - Placed on Precedex RASS score -2.  Counseled patient  this would be the safest way to get him through his withdrawal and he concurs. -Folic acid 1 mg daily - Thiamine 100 mg daily - PCCM notified.  Discussed with Dr.Mannam Praveen - COVID, RSV, influenza A/B panel negative  -On 1/22 will begin to wean patient off Precedex, should be passed EtOH withdrawal window.  Essential HTN - Currently controlled without BP medication, and given patient starting Precedex will hold on any additional medication at this time.   Adjustment disorder with depressed mood - Per MAR does not appear to be on medication   Seizures - Per patient's MAR on Keppra 500 mg BID -Keppra level pending - Keppra IV 500 mg BID  Elevated liver enzymes - Most likely secondary to EtOH abuse.  Leukocytosis - Most likely secondary to EtOH abuse.   Mobility Assessment (last 72 hours)     Mobility Assessment   No documentation.           DVT prophylaxis: Lovenox Code Status: Full Family Communication:  Status is: Inpatient    Dispo: The patient is from: Home              Anticipated d/c is to: Home              Anticipated d/c date is: > 3 days              Patient currently is not medically stable to d/c.      Consultants:  PCCM   Procedures/Significant Events:  1/19 CT head Wo contrast - Negative acute findings  I have personally reviewed and interpreted all radiology studies and my findings are as above.  VENTILATOR SETTINGS:    Cultures 1/19 influenza A/B negative 1/19 RSV negative 1/19 SARS coronavirus negative   Antimicrobials:    Devices    LINES / TUBES:      Continuous Infusions:  dexmedetomidine (PRECEDEX) IV infusion Stopped (12/10/22 1737)   levETIRAcetam Stopped (12/11/22 0557)     Objective: Vitals:   12/11/22 0500 12/11/22 0546 12/11/22 0600 12/11/22 0700  BP: 120/83  135/78 (!) 112/54  Pulse: 61 78 81 69  Resp: 13 15 16 15   Temp:      TempSrc:      SpO2: 93% 96% 95% 94%  Weight:      Height:         Intake/Output Summary (Last 24 hours) at 12/11/2022 12/13/2022 Last data filed at 12/11/2022 0700 Gross per 24 hour  Intake 2782.73 ml  Output 675 ml  Net 2107.73 ml   Filed Weights   12/10/22 1501  Weight: 76.5 kg    Examination:  General: Sleepy but arousable (RASS -2), No acute respiratory distress Eyes: negative scleral hemorrhage, negative anisocoria, negative icterus ENT: Negative Runny nose, negative gingival bleeding, Neck:  Negative scars, masses, torticollis, lymphadenopathy, JVD Lungs: Clear to auscultation bilaterally without wheezes or crackles Cardiovascular: Regular rate and rhythm without murmur gallop or rub normal S1 and S2 Abdomen: negative abdominal pain, nondistended, positive soft, bowel sounds, no rebound, no ascites, no appreciable mass Extremities: No significant cyanosis, clubbing, or edema bilateral lower extremities Skin: Negative rashes, lesions, ulcers Psychiatric: Could not evaluate patient on Precedex Central nervous system: Follows all commands, spontaneously moves all extremities on Precedex  .     Data Reviewed: Care during the described time interval was provided by me .  I have reviewed this patient's available data, including medical history, events of note, physical examination, and all test results as part of my evaluation.   CBC: Recent Labs  Lab 12/10/22 1142 12/11/22 0314  WBC 4.0 2.9*  NEUTROABS 2.5 1.5*  HGB 13.4 12.8*  HCT 40.1 38.4*  MCV 100.3* 101.6*  PLT 212 163   Basic Metabolic Panel: Recent Labs  Lab 12/10/22 1142 12/11/22 0314  NA 140 138  K 3.6 3.6  CL 104 103  CO2 24 25  GLUCOSE 84 76  BUN 15 12  CREATININE 1.08 1.00  CALCIUM 9.6 8.9  MG 1.9 1.7  PHOS  --  3.1   GFR: Estimated Creatinine Clearance: 97.3 mL/min (by C-G formula based on SCr of 1 mg/dL). Liver Function Tests: Recent Labs  Lab 12/10/22 1142 12/11/22 0314  AST 126* 96*  ALT 97* 83*  ALKPHOS 49 49  BILITOT 0.7 1.1  PROT 6.7 6.4*   ALBUMIN 4.1 3.9   No results for input(s): "LIPASE", "AMYLASE" in the last 168 hours. No results for input(s): "AMMONIA" in the last 168 hours. Coagulation Profile: No results for input(s): "INR", "PROTIME" in the last 168 hours. Cardiac Enzymes: No results for input(s): "CKTOTAL", "CKMB", "CKMBINDEX", "TROPONINI" in the last 168 hours. BNP (last 3 results) No results for input(s): "PROBNP" in the last 8760 hours. HbA1C: No results for input(s): "HGBA1C" in the last 72 hours. CBG: Recent Labs  Lab 12/10/22 1139  GLUCAP 96   Lipid Profile: No results for input(s): "CHOL", "HDL", "LDLCALC", "TRIG", "CHOLHDL", "LDLDIRECT" in the last 72 hours. Thyroid Function Tests: Recent Labs    12/10/22 1648  TSH 0.928   Anemia Panel: No results  for input(s): "VITAMINB12", "FOLATE", "FERRITIN", "TIBC", "IRON", "RETICCTPCT" in the last 72 hours. Urine analysis:    Component Value Date/Time   COLORURINE YELLOW 08/30/2022 [place] 08/30/2022 1744   LABSPEC 1.018 08/30/2022 1744   PHURINE 6.0 08/30/2022 1744   GLUCOSEU 50 (A) 08/30/2022 1744   HGBUR NEGATIVE 08/30/2022 1744   BILIRUBINUR NEGATIVE 08/30/2022 1744   KETONESUR NEGATIVE 08/30/2022 1744   PROTEINUR NEGATIVE 08/30/2022 1744   NITRITE NEGATIVE 08/30/2022 1744   LEUKOCYTESUR NEGATIVE 08/30/2022 1744   Sepsis Labs: @LABRCNTIP (procalcitonin:4,lacticidven:4)  ) Recent Results (from the past 240 hour(s))  Resp panel by RT-PCR (RSV, Flu A&B, Covid) Urine, Clean Catch     Status: None   Collection Time: 12/10/22  2:38 PM   Specimen: Urine, Clean Catch; Nasal Swab  Result Value Ref Range Status   SARS Coronavirus 2 by RT PCR NEGATIVE NEGATIVE Final    Comment: (NOTE) SARS-CoV-2 target nucleic acids are NOT DETECTED.  The SARS-CoV-2 RNA is generally detectable in upper respiratory specimens during the acute phase of infection. The lowest concentration of SARS-CoV-2 viral copies this assay can detect is 138  copies/mL. A negative result does not preclude SARS-Cov-2 infection and should not be used as the sole basis for treatment or other patient management decisions. A negative result may occur with  improper specimen collection/handling, submission of specimen other than nasopharyngeal swab, presence of viral mutation(s) within the areas targeted by this assay, and inadequate number of viral copies(<138 copies/mL). A negative result must be combined with clinical observations, patient history, and epidemiological information. The expected result is Negative.  Fact Sheet for Patients:  EntrepreneurPulse.com.au  Fact Sheet for Healthcare Providers:  IncredibleEmployment.be  This test is no t yet approved or cleared by the [place] FDA and  has been authorized for detection and/or diagnosis of SARS-CoV-2 by FDA under an Emergency Use Authorization (EUA). This EUA will remain  in effect (meaning this test can be used) for the duration of the COVID-19 declaration under Section 564(b)(1) of the Act, 21 U.S.C.section 360bbb-3(b)(1), unless the authorization is terminated  or revoked sooner.       Influenza A by PCR NEGATIVE NEGATIVE Final   Influenza B by PCR NEGATIVE NEGATIVE Final    Comment: (NOTE) The Xpert Xpress SARS-CoV-2/FLU/RSV plus assay is intended as an aid in the diagnosis of influenza from Nasopharyngeal swab specimens and should not be used as a sole basis for treatment. Nasal washings and aspirates are unacceptable for Xpert Xpress SARS-CoV-2/FLU/RSV testing.  Fact Sheet for Patients: EntrepreneurPulse.com.au  Fact Sheet for Healthcare Providers: IncredibleEmployment.be  This test is not yet approved or cleared by the [place] FDA and has been authorized for detection and/or diagnosis of SARS-CoV-2 by FDA under an Emergency Use Authorization (EUA). This EUA will remain in effect (meaning  this test can be used) for the duration of the COVID-19 declaration under Section 564(b)(1) of the Act, 21 U.S.C. section 360bbb-3(b)(1), unless the authorization is terminated or revoked.     Resp Syncytial Virus by PCR NEGATIVE NEGATIVE Final    Comment: (NOTE) Fact Sheet for Patients: EntrepreneurPulse.com.au  Fact Sheet for Healthcare Providers: IncredibleEmployment.be  This test is not yet approved or cleared by the [place] FDA and has been authorized for detection and/or diagnosis of SARS-CoV-2 by FDA under an Emergency Use Authorization (EUA). This EUA will remain in effect (meaning this test can be used) for the duration of the COVID-19 declaration under Section 564(b)(1) of the Act, 21  U.S.C. section 360bbb-3(b)(1), unless the authorization is terminated or revoked.  Performed at [facility], [place] [address] [place], [place] [address]   MRSA Next Gen by PCR, Nasal     Status: None   Collection Time: 12/10/22  4:49 PM   Specimen: Nasal Mucosa; Nasal Swab  Result Value Ref Range Status   MRSA by PCR Next Gen NOT DETECTED NOT DETECTED Final    Comment: (NOTE) The GeneXpert MRSA Assay (FDA approved for NASAL specimens only), is one component of a comprehensive MRSA colonization surveillance program. It is not intended to diagnose MRSA infection nor to guide or monitor treatment for MRSA infections. Test performance is not FDA approved in patients less than 21 years old. Performed at [facility], [place] [address] [place], [place] [address]          Radiology Studies: CT HEAD WO CONTRAST (5MM)  Result Date: 12/10/2022 CLINICAL DATA:  Head trauma, focal neuro findings.  Seizures. EXAM: CT HEAD WITHOUT CONTRAST TECHNIQUE: Contiguous axial images were obtained from the base of the skull through the vertex without intravenous contrast. RADIATION DOSE REDUCTION: This exam was performed  according to the departmental dose-optimization program which includes automated exposure control, adjustment of the mA and/or kV according to patient size and/or use of iterative reconstruction technique. COMPARISON:  Head CT 09/12/2022. FINDINGS: Brain: No acute hemorrhage, mass effect or midline shift. Gray-white differentiation is preserved. No hydrocephalus. No extra-axial collection. Basilar cisterns are patent. Vascular: No hyperdense vessel or unexpected calcification. Skull: No calvarial fracture or suspicious bone lesion. Skull base is unremarkable. Sinuses/Orbits: Paranasal sinuses, mastoid air cells, and middle ear cavities are well aerated. Orbits are unremarkable. Other: None. IMPRESSION: No acute intracranial abnormality. Electronically Signed   By: Emmit Alexanders M.D.   On: 12/10/2022 15:52        Scheduled Meds:  Chlorhexidine Gluconate Cloth  6 each Topical Daily   enoxaparin (LOVENOX) injection  40 mg Subcutaneous QHS   folic acid  1 mg Intravenous Daily   multivitamin  15 mL Oral Daily   PHENObarbital  97.5 mg Intravenous Q8H   Followed by   Derrill Memo ON 12/12/2022] PHENObarbital  65 mg Intravenous Q8H   Followed by   Derrill Memo ON 12/14/2022] PHENObarbital  32.5 mg Intravenous Q8H   thiamine (VITAMIN B1) injection  100 mg Intravenous Daily   Continuous Infusions:  dexmedetomidine (PRECEDEX) IV infusion Stopped (12/10/22 1737)   levETIRAcetam Stopped (12/11/22 0557)     LOS: 1 day   The patient is critically ill with multiple organ systems failure and requires high complexity decision making for assessment and support, frequent evaluation and titration of therapies, application of advanced monitoring technologies and extensive interpretation of multiple databases. Critical Care Time devoted to patient care services described in this note  Time spent: 40 minutes     Thora Scherman, Geraldo Docker, MD [facility]   If 7PM-7AM, please contact night-coverage 12/11/2022, 8:14 AM

## 2022-12-11 NOTE — Plan of Care (Signed)
  Problem: Education: Goal: Expressions of having a comfortable level of knowledge regarding the disease process will increase Outcome: Progressing   Problem: Coping: Goal: Ability to adjust to condition or change in health will improve Outcome: Progressing Goal: Ability to identify appropriate support needs will improve Outcome: Progressing   Problem: Health Behavior/Discharge Planning: Goal: Compliance with prescribed medication regimen will improve Outcome: Progressing   Problem: Medication: Goal: Risk for medication side effects will decrease Outcome: Progressing   Problem: Clinical Measurements: Goal: Complications related to the disease process, condition or treatment will be avoided or minimized Outcome: Progressing Goal: Diagnostic test results will improve Outcome: Progressing   Problem: Safety: Goal: Verbalization of understanding the information provided will improve Outcome: Progressing   Problem: Self-Concept: Goal: Level of anxiety will decrease Outcome: Progressing Goal: Ability to verbalize feelings about condition will improve Outcome: Progressing   Problem: Education: Goal: Knowledge of General Education information will improve Description: Including pain rating scale, medication(s)/side effects and non-pharmacologic comfort measures Outcome: Progressing   Problem: Health Behavior/Discharge Planning: Goal: Ability to manage health-related needs will improve Outcome: Progressing   Problem: Clinical Measurements: Goal: Ability to maintain clinical measurements within normal limits will improve Outcome: Progressing Goal: Will remain free from infection Outcome: Progressing Goal: Diagnostic test results will improve Outcome: Progressing Goal: Respiratory complications will improve Outcome: Progressing Goal: Cardiovascular complication will be avoided Outcome: Progressing   Problem: Activity: Goal: Risk for activity intolerance will  decrease Outcome: Progressing   Problem: Nutrition: Goal: Adequate nutrition will be maintained Outcome: Progressing   Problem: Coping: Goal: Level of anxiety will decrease Outcome: Progressing   Problem: Elimination: Goal: Will not experience complications related to bowel motility Outcome: Progressing Goal: Will not experience complications related to urinary retention Outcome: Progressing   Problem: Pain Managment: Goal: General experience of comfort will improve Outcome: Progressing   Problem: Safety: Goal: Ability to remain free from injury will improve Outcome: Progressing   Problem: Skin Integrity: Goal: Risk for impaired skin integrity will decrease Outcome: Progressing   Problem: Education: Goal: Knowledge of disease or condition will improve Outcome: Progressing Goal: Understanding of discharge needs will improve Outcome: Progressing   Problem: Health Behavior/Discharge Planning: Goal: Ability to identify changes in lifestyle to reduce recurrence of condition will improve Outcome: Progressing Goal: Identification of resources available to assist in meeting health care needs will improve Outcome: Progressing   Problem: Physical Regulation: Goal: Complications related to the disease process, condition or treatment will be avoided or minimized Outcome: Progressing   Problem: Safety: Goal: Ability to remain free from injury will improve Outcome: Progressing

## 2022-12-11 NOTE — Progress Notes (Signed)
Patient woke up after resting for a few hours CIWA reassessment 11 from 17 earlier.

## 2022-12-11 NOTE — Consult Note (Signed)
NAME:  Adam Metallo., MRN:  [id number], DOB:  [birth year], LOS: 1 ADMISSION DATE:  12/10/2022, CONSULTATION DATE:  12/10/2022 REFERRING MD:  Sherral Hammers, CHIEF COMPLAINT:  ETOH withdrawal    History of Present Illness:  Adam Angert. Kinne Jr. Is a 37 yo male with a PMH of alcohol abuse with withdrawal seizures, Dts, and HTN. Pt drove himself to the [facility] after having a seizure, and while in the parking lot, had another seizure that was witnessed by his wife on FaceTime. Seizure reportedly lasted for approx 44min. Of note, patient has been trying to abstain from ETOH and admits to drinking 1/5 a day up until two days ago where he cut back to just drinking 1 shot of liquor to help with his tremor.    In the ED patient had his third seizure within the last 12 hrs. ED workup reveled AST 126, ALT 97. CBC consistent with macrocytic anemia. ETOH 86, LA 2.8. RVP negative, and UDS positive for benzo's but was administered in the ED and EMS. CT Head resulted with NAICA. He reported having tremor, visual and auditory hallucinations.   PCCM consulted for further management for ETOH withdrawal.     Pertinent  Medical History  ETOH abuse, ETOH withdrawal seizures, HTN, DT's  Significant Hospital Events: Including procedures, antibiotic start and stop dates in addition to other pertinent events   12/10/2022 - Admitted for ETOH withdrawal seizures   Interim History / Subjective:   Stable overnight.  Did not require Precedex drip  Objective   Blood pressure 117/68, pulse (!) 114, temperature (!) 97.4 F (36.3 C), temperature source Oral, resp. rate 16, height 5\' 7"  (1.702 m), weight 76.5 kg, SpO2 93 %.        Intake/Output Summary (Last 24 hours) at 12/11/2022 1032 Last data filed at 12/11/2022 0800 Gross per 24 hour  Intake 2782.73 ml  Output 925 ml  Net 1857.73 ml    Filed Weights   12/10/22 1501  Weight: 76.5 kg   Physical Examination: Gen:      No acute distress HEENT:  EOMI, sclera  anicteric Neck:     No masses; no thyromegaly Lungs:    Clear to auscultation bilaterally; normal respiratory effort CV:         Regular rate and rhythm; no murmurs Abd:      + bowel sounds; soft, non-tender; no palpable masses, no distension Ext:    No edema; adequate peripheral perfusion Skin:      Warm and dry; no rash Neuro: Awake, alert  Resolved Hospital Problem list    Assessment & Plan:   ETOH Abuse with Withdrawal Seizure Plan  - Phenobarbital Taper per Pharmacy  - MVI, Thiamine, Folate - CIWA protocol with PRN Ativan  - Seizure precautions - Aspiration precautions  - Monitor neuro status - Keppra level pending, continue Keppra  Macrocytic Anemia  > likely d/t ETOH abuse  - Trend H&H - Monitor for signs of active bleeding - Transfuse for Hgb < 7.0 or hemodynamically significant bleeding - Thiamine as above  Hx of HTN Plan  - Holding home lisinopril  - Will add PRN Hydralazine   PCCM will be available as needed.  Please call with any questions.   Best Practice (right click and "Reselect all SmartList Selections" daily)  Diet/type: Regular DVT prophylaxis: LMWH GI prophylaxis: N/A Lines: N/A Foley:  N/A Code Status:  full code Last date of multidisciplinary goals of care discussion: per primary. Patient updated on plan of  care 1/19 and in agreement.   Critical care time: NA   Marshell Garfinkel MD [facility] See Amion for pager  If no response to pager , please call [phone] until 7pm After 7:00 pm call Elink  [phone] 12/11/2022, 10:33 AM

## 2022-12-12 NOTE — Discharge Summary (Signed)
                                                  Against Medical Advice Patient at this time expresses desire to leave the Hospital immidiately, patient has been warned that this is not Medically advisable at this time, and can result in Medical complications like Death and Disability, patient understands and accepts the risks involved and assumes full responsibilty of this decision.  This patient has also been advised that if they feel the need for further medical assistance to return to any available ER or dial [phone].  Informed by Nursing staff that this patient has left care and has signed the form  Against Medical Advice on 12/12/2022 at 12:32 PM  Dr Vicente Serene, [place] Minutes 0

## 2022-12-12 NOTE — Progress Notes (Signed)
                                                  Against Medical Advice Patient at this time expresses desire to leave the Hospital immediately, patient has been warned that this is not Medically advisable at this time, and can result in Medical complications like Death and Disability, patient understands and accepts the risks involved and assumes full responsibilty of this decision.  Patient is awake and Oriented to Person, Place, Time, and self. Alert in detail including: Time, time of year including correct date and most recent holiday.       This patient has also been advised that if they feel the need for further medical assistance to return to any available ER or dial 9-1-1.  Informed by Nursing staff that this patient has left care and has signed the form  Against Medical Advice on 12/12/2022 at 0013 Hrs.  Gershon Cull BSN MSNA MSN ACNPC-AG Acute Care Nurse Practitioner [place]

## 2022-12-12 NOTE — Progress Notes (Signed)
Patient left AMA, patient alert and oriented. Gershon Cull NP at bedside explaining pro and cons of Leaving, instructions given if needing to come back to hospital.Time given for questions and answers, all IV's taken out with pressure dressings applied, vitals within normal limits, all belongings sent with patient, Charge nurse notified of patient leaving AMA, at bedside to have patient sigh paperwork after asking a series of  questions which patient answer all correctly, Patient wife at bedside to escort patient out of hospital. Patient able to walk on his own.

## 2022-12-13 LAB — LEVETIRACETAM LEVEL: Levetiracetam Lvl: <2 ug/mL — ABNORMAL LOW (ref 10.0–40.0)

## 2022-12-15 ENCOUNTER — Other Ambulatory Visit: Payer: Self-pay

## 2022-12-15 ENCOUNTER — Inpatient Hospital Stay (HOSPITAL_COMMUNITY)
Admission: EM | Admit: 2022-12-15 | Discharge: 2022-12-16 | DRG: 897 | Disposition: A | Discharge status: Home / Self Care | Payer: Self-pay | Attending: Internal Medicine | Admitting: Internal Medicine

## 2022-12-15 ENCOUNTER — Emergency Department (HOSPITAL_COMMUNITY): Payer: Self-pay

## 2022-12-15 ENCOUNTER — Encounter (HOSPITAL_COMMUNITY): Payer: Self-pay | Admitting: Internal Medicine

## 2022-12-15 DIAGNOSIS — R569 Unspecified convulsions: Secondary | ICD-10-CM | POA: Diagnosis present

## 2022-12-15 DIAGNOSIS — I10 Essential (primary) hypertension: Secondary | ICD-10-CM | POA: Diagnosis present

## 2022-12-15 DIAGNOSIS — F10931 Alcohol use, unspecified with withdrawal delirium: Secondary | ICD-10-CM | POA: Diagnosis present

## 2022-12-15 DIAGNOSIS — Y904 Blood alcohol level of 80-99 mg/100 ml: Secondary | ICD-10-CM | POA: Diagnosis present

## 2022-12-15 DIAGNOSIS — Z91030 Bee allergy status: Secondary | ICD-10-CM

## 2022-12-15 DIAGNOSIS — F10231 Alcohol dependence with withdrawal delirium: Principal | ICD-10-CM | POA: Diagnosis present

## 2022-12-15 DIAGNOSIS — F419 Anxiety disorder, unspecified: Secondary | ICD-10-CM | POA: Diagnosis present

## 2022-12-15 DIAGNOSIS — Z8249 Family history of ischemic heart disease and other diseases of the circulatory system: Secondary | ICD-10-CM

## 2022-12-15 DIAGNOSIS — F10932 Alcohol use, unspecified with withdrawal with perceptual disturbance: Principal | ICD-10-CM

## 2022-12-15 DIAGNOSIS — Z801 Family history of malignant neoplasm of trachea, bronchus and lung: Secondary | ICD-10-CM

## 2022-12-15 LAB — CBC WITH DIFFERENTIAL/PLATELET
Abs Immature Granulocytes: 0.02 10*3/uL (ref 0.00–0.07)
Basophils Absolute: 0.1 10*3/uL (ref 0.0–0.1)
Basophils Relative: 1 %
Eosinophils Absolute: 0.1 10*3/uL (ref 0.0–0.5)
Eosinophils Relative: 1 %
HCT: 42.8 % (ref 39.0–52.0)
Hemoglobin: 14.6 g/dL (ref 13.0–17.0)
Immature Granulocytes: 0 %
Lymphocytes Relative: 23 %
Lymphs Abs: 1.7 10*3/uL (ref 0.7–4.0)
MCH: 34.5 pg — ABNORMAL HIGH (ref 26.0–34.0)
MCHC: 34.1 g/dL (ref 30.0–36.0)
MCV: 101.2 fL — ABNORMAL HIGH (ref 80.0–100.0)
Monocytes Absolute: 0.7 10*3/uL (ref 0.1–1.0)
Monocytes Relative: 9 %
Neutro Abs: 5 10*3/uL (ref 1.7–7.7)
Neutrophils Relative %: 66 %
Platelets: 179 10*3/uL (ref 150–400)
RBC: 4.23 MIL/uL (ref 4.22–5.81)
RDW: 12.7 % (ref 11.5–15.5)
WBC: 7.6 10*3/uL (ref 4.0–10.5)
nRBC: 0 % (ref 0.0–0.2)

## 2022-12-15 LAB — MAGNESIUM
Magnesium: 1.9 mg/dL (ref 1.7–2.4)
Magnesium: 1.8 mg/dL (ref 1.7–2.4)

## 2022-12-15 LAB — CBG MONITORING, ED: Glucose-Capillary: 116 mg/dL — ABNORMAL HIGH (ref 70–99)

## 2022-12-15 LAB — RAPID URINE DRUG SCREEN, HOSP PERFORMED
Amphetamines: NOT DETECTED
Barbiturates: POSITIVE — AB
Benzodiazepines: POSITIVE — AB
Cocaine: NOT DETECTED
Opiates: NOT DETECTED
Tetrahydrocannabinol: NOT DETECTED

## 2022-12-15 LAB — COMPREHENSIVE METABOLIC PANEL
ALT: 87 U/L — ABNORMAL HIGH (ref 0–44)
AST: 77 U/L — ABNORMAL HIGH (ref 15–41)
Albumin: 4.4 g/dL (ref 3.5–5.0)
Alkaline Phosphatase: 54 U/L (ref 38–126)
Anion gap: 12 (ref 5–15)
BUN: 10 mg/dL (ref 6–20)
CO2: 23 mmol/L (ref 22–32)
Calcium: 9.4 mg/dL (ref 8.9–10.3)
Chloride: 100 mmol/L (ref 98–111)
Creatinine, Ser: 1.13 mg/dL (ref 0.61–1.24)
GFR, Estimated: >60 mL/min (ref >60)
Glucose, Bld: 113 mg/dL — ABNORMAL HIGH (ref 70–99)
Potassium: 3.9 mmol/L (ref 3.5–5.1)
Sodium: 135 mmol/L (ref 135–145)
Total Bilirubin: 0.6 mg/dL (ref 0.3–1.2)
Total Protein: 7.8 g/dL (ref 6.5–8.1)

## 2022-12-15 LAB — SALICYLATE LEVEL: Salicylate Lvl: <7 mg/dL — ABNORMAL LOW (ref 7.0–30.0)

## 2022-12-15 LAB — TROPONIN I (HIGH SENSITIVITY): Troponin I (High Sensitivity): 4 ng/L (ref <18)

## 2022-12-15 LAB — PHOSPHORUS: Phosphorus: 3.4 mg/dL (ref 2.5–4.6)

## 2022-12-15 LAB — ACETAMINOPHEN LEVEL: Acetaminophen (Tylenol), Serum: <10 ug/mL — ABNORMAL LOW (ref 10–30)

## 2022-12-15 LAB — ETHANOL: Alcohol, Ethyl (B): 90 mg/dL — ABNORMAL HIGH (ref <10)

## 2022-12-15 MED ORDER — SODIUM CHLORIDE 0.9 % IV BOLUS
1000.0000 mL | Freq: Once | INTRAVENOUS | Status: AC
  Administered 2022-12-15: 1000 mL via INTRAVENOUS

## 2022-12-15 MED ORDER — LORAZEPAM 1 MG PO TABS: 0.0000 mg | ORAL_TABLET | Freq: Two times a day (BID) | ORAL | Status: DC

## 2022-12-15 MED ORDER — LORAZEPAM 1 MG PO TABS
1.0000 mg | ORAL_TABLET | Freq: Once | ORAL | Status: AC
  Administered 2022-12-15: 1 mg via ORAL
  Filled 2022-12-15: qty 1

## 2022-12-15 MED ORDER — ONDANSETRON HCL 4 MG/2ML IJ SOLN
4.0000 mg | Freq: Once | INTRAMUSCULAR | Status: AC
  Administered 2022-12-15: 4 mg via INTRAVENOUS
  Filled 2022-12-15: qty 2

## 2022-12-15 MED ORDER — LISINOPRIL 10 MG PO TABS
20.0000 mg | ORAL_TABLET | Freq: Every day | ORAL | Status: DC
  Administered 2022-12-15: 20 mg via ORAL
  Filled 2022-12-15: qty 2

## 2022-12-15 MED ORDER — DEXTROSE-NACL 5-0.45 % IV SOLN: INTRAVENOUS | Status: DC

## 2022-12-15 MED ORDER — FOLIC ACID 1 MG PO TABS
1.0000 mg | ORAL_TABLET | Freq: Every day | ORAL | Status: DC
  Administered 2022-12-15: 1 mg via ORAL
  Filled 2022-12-15: qty 1

## 2022-12-15 MED ORDER — ENOXAPARIN SODIUM 40 MG/0.4ML IJ SOSY: 40.0000 mg | PREFILLED_SYRINGE | Freq: Every day | INTRAMUSCULAR | Status: DC

## 2022-12-15 MED ORDER — THIAMINE HCL 100 MG/ML IJ SOLN: 100.0000 mg | Freq: Every day | INTRAMUSCULAR | Status: DC

## 2022-12-15 MED ORDER — LORAZEPAM 1 MG PO TABS
2.0000 mg | ORAL_TABLET | ORAL | Status: DC | PRN
  Administered 2022-12-15: 2 mg via ORAL
  Filled 2022-12-15: qty 2

## 2022-12-15 MED ORDER — ONDANSETRON HCL 4 MG PO TABS
4.0000 mg | ORAL_TABLET | Freq: Four times a day (QID) | ORAL | Status: DC | PRN
  Administered 2022-12-15: 4 mg via ORAL
  Filled 2022-12-15: qty 1

## 2022-12-15 MED ORDER — SODIUM CHLORIDE 0.9 % IV BOLUS
2000.0000 mL | Freq: Once | INTRAVENOUS | Status: AC
  Administered 2022-12-15: 2000 mL via INTRAVENOUS

## 2022-12-15 MED ORDER — ONDANSETRON HCL 4 MG/2ML IJ SOLN: 4.0000 mg | Freq: Four times a day (QID) | INTRAMUSCULAR | Status: DC | PRN

## 2022-12-15 MED ORDER — LORAZEPAM 1 MG PO TABS
0.0000 mg | ORAL_TABLET | Freq: Four times a day (QID) | ORAL | Status: DC
  Administered 2022-12-15 – 2022-12-16 (×2): 3 mg via ORAL
  Filled 2022-12-15 (×2): qty 1
  Filled 2022-12-15 (×2): qty 3

## 2022-12-15 MED ORDER — LORAZEPAM 1 MG PO TABS: 0.0000 mg | ORAL_TABLET | Freq: Four times a day (QID) | ORAL | Status: DC

## 2022-12-15 MED ORDER — TRAMADOL HCL 50 MG PO TABS: 50.0000 mg | ORAL_TABLET | Freq: Three times a day (TID) | ORAL | Status: DC | PRN

## 2022-12-15 MED ORDER — THIAMINE MONONITRATE 100 MG PO TABS
100.0000 mg | ORAL_TABLET | Freq: Every day | ORAL | Status: DC
  Administered 2022-12-15: 100 mg via ORAL
  Filled 2022-12-15: qty 1

## 2022-12-15 MED ORDER — THIAMINE HCL 100 MG PO TABS: 100.0000 mg | ORAL_TABLET | Freq: Every day | ORAL | Status: DC

## 2022-12-15 MED ORDER — LORAZEPAM 1 MG PO TABS
1.0000 mg | ORAL_TABLET | ORAL | Status: DC | PRN
  Administered 2022-12-15: 1 mg via ORAL
  Administered 2022-12-15: 4 mg via ORAL
  Administered 2022-12-15: 1 mg via ORAL
  Filled 2022-12-15: qty 4

## 2022-12-15 MED ORDER — ADULT MULTIVITAMIN W/MINERALS CH
1.0000 | ORAL_TABLET | Freq: Every day | ORAL | Status: DC
  Administered 2022-12-15: 1 via ORAL
  Filled 2022-12-15: qty 1

## 2022-12-15 MED ORDER — FOLIC ACID 1 MG PO TABS
1.0000 mg | ORAL_TABLET | Freq: Once | ORAL | Status: AC
  Administered 2022-12-15: 1 mg via ORAL
  Filled 2022-12-15: qty 1

## 2022-12-15 MED ORDER — POLYETHYLENE GLYCOL 3350 17 G PO PACK: 17.0000 g | PACK | Freq: Every day | ORAL | Status: DC | PRN

## 2022-12-15 NOTE — ED Provider Notes (Signed)
Patient care taken over at shift change from [facility], PA-C.    In short, 37 year old male patient presented to the emergency department complaining of alcohol withdrawal with seizures at home, auditory hallucinations, visual hallucinations, sensation that things are crawling on him, nausea, vomiting, anxiety, and tremor.  Patient reported seizure at home prior to coming to the emergency department.  ED tech also witnessed 60 seconds of seizure-like activity in triage.  Patient with history of prior alcohol withdrawal seizures.  Patient was admitted to the hospital on January 19 for alcohol withdrawal symptoms, left AMA on January 21 due to his father's passing.  He states that on the 22nd he drank 1/5 of alcohol.  He is here today seeking further evaluation and care.  Physical Exam  BP 136/73 (BP Location: Right Arm)   Pulse 88   Temp 97.6 F (36.4 C) (Oral)   Resp 16   SpO2 98%   Physical Exam Vitals and nursing note reviewed.  HENT:     Head: Normocephalic.     Nose: Nose normal.     Mouth/Throat:     Mouth: Mucous membranes are moist.  Eyes:     Conjunctiva/sclera: Conjunctivae normal.  Cardiovascular:     Rate and Rhythm: Normal rate.  Pulmonary:     Effort: Pulmonary effort is normal.  Neurological:     General: No focal deficit present.     Mental Status: He is alert.     Comments: Tremors present  Psychiatric:     Comments: Patient appears anxious     Procedures  Procedures  ED Course / MDM    Medical Decision Making Amount and/or Complexity of Data Reviewed Labs: ordered. Radiology: ordered.  Risk OTC drugs. Prescription drug management.   I reviewed the patient's lab work.  Pertinent results include AST 77, ALT 87 (slightly improved from last week); ethanol 90  CT head was grossly unremarkable  I discussed possible discharge home on Librium taper with the patient but the patient was very reluctant to discharge at this time.  He was scared of  relapse by going home.  He does still endorse auditory and visual hallucinations.  There has been no further seizure-like activity.  Spoke with Dr. Venetia Constable with the hospital service about possible admission.  He agreed to come see the patient for admission.      Ronny Bacon 12/15/22 1157    Ezequiel Essex, MD 12/15/22 [phone]

## 2022-12-15 NOTE — ED Notes (Signed)
Made Dr. Olevia Bowens aware of the patients BP

## 2022-12-15 NOTE — ED Provider Notes (Signed)
[facility] Provider Note   CSN: [id number] Arrival date & time: 12/15/22  5366     History  Chief Complaint  Patient presents with   Withdrawal    Renae Gloss [place] [name] is a 37 y.o. male presents reporting alcohol withdrawal with seizures at home, auditory hallucinations, sensation that there is things crawling on him, nausea and vomiting, anxiety, and tremor.  Reports seizure at home this evening prior to arrival.  Also reportedly by ED tech had 60 seconds witnessed seizure-like activity in triage here.  At time of my arrival to the bedside patient is ANO x 4, endorsing headache, nausea, tingling sensation, auditory hallucinations, Diarrhea and anxiety at this time.  Anticonvulsants were required to stop seizure activity reported from triage.  Per chart review patient has history of alcohol withdrawal seizures in the past Was recently admitted to the hospital for alcohol withdrawal with delirium, left AMA reporting that his father had passed away.  I personally read his medical records.  Additionally the listed history is history of hypertension. CIWA 16 at time of my evaluation.   HPI     Home Medications Prior to Admission medications   Medication Sig Start Date End Date Taking? Authorizing Provider  chlordiazePOXIDE (LIBRIUM) 25 MG capsule Take 1 capsule three times a day (AM-noon-PM) for 2 days, then taper to 25 mg (1 capsule) 2 times a day (AM and PM) for 1 day, then 25mg  (1 cap) once in the morning, then off Patient not taking: Reported on 12/11/2022 09/16/22   Rai, Vernelle Emerald, MD  folic acid (FOLVITE) 1 MG tablet Take 1 tablet (1 mg total) by mouth daily. Patient not taking: Reported on 12/11/2022 09/13/22   Rai, Vernelle Emerald, MD  gabapentin (NEURONTIN) 300 MG capsule Take 1 capsule (300 mg total) by mouth 2 (two) times daily. Patient not taking: Reported on 12/11/2022 09/13/22 10/13/22  Mendel Corning, MD  levETIRAcetam (KEPPRA)  500 MG tablet Take 1 tablet (500 mg total) by mouth 2 (two) times daily. Patient not taking: Reported on 12/11/2022 09/13/22   Rai, Vernelle Emerald, MD  lisinopril (ZESTRIL) 20 MG tablet Take 1 tablet (20 mg total) by mouth daily. 09/13/22   Rai, Vernelle Emerald, MD  mupirocin cream (BACTROBAN) 2 % Apply topically 2 (two) times daily. Apply to fingers/hand. Cream/Ointment or generic whichever is available. Patient not taking: Reported on 12/11/2022 09/13/22   Mendel Corning, MD  NONFORMULARY OR COMPOUNDED ITEM Please provide 1 week supply for the following dressing in his hands. Cover with single layer of xeroform (non adherent dressing). Wrap with conform gauze (1" or 2") change every other day 09/02/22   Thurnell Lose, MD  thiamine (VITAMIN B1) 100 MG tablet Take 1 tablet (100 mg total) by mouth daily. Patient not taking: Reported on 12/11/2022 09/13/22   Mendel Corning, MD      Allergies    Bee venom    Review of Systems   Review of Systems  Constitutional:  Positive for activity change, appetite change, chills and diaphoresis.  Cardiovascular:  Positive for chest pain.  Gastrointestinal:  Positive for diarrhea, nausea and vomiting.  Neurological:  Positive for tremors.  Psychiatric/Behavioral:  The patient is nervous/anxious.     Physical Exam Updated Vital Signs BP (!) 160/101   Pulse 90   Temp (!) 97.1 F (36.2 C)   Resp 18   SpO2 99%  Physical Exam Vitals and nursing note reviewed.  Constitutional:  Appearance: He is not ill-appearing or toxic-appearing.  HENT:     Head: Normocephalic and atraumatic.     Mouth/Throat:     Mouth: Mucous membranes are moist.     Pharynx: No oropharyngeal exudate or posterior oropharyngeal erythema.  Eyes:     General:        Right eye: No discharge.        Left eye: No discharge.     Conjunctiva/sclera: Conjunctivae normal.  Cardiovascular:     Rate and Rhythm: Normal rate and regular rhythm.     Pulses: Normal pulses.     Heart  sounds: Normal heart sounds. No murmur heard. Pulmonary:     Effort: Pulmonary effort is normal. No respiratory distress.     Breath sounds: Normal breath sounds. No wheezing or rales.  Abdominal:     General: Bowel sounds are normal. There is no distension.     Palpations: Abdomen is soft.     Tenderness: There is no abdominal tenderness. There is no guarding or rebound.  Musculoskeletal:        General: No deformity.     Cervical back: Neck supple.  Skin:    General: Skin is warm and dry.  Neurological:     Mental Status: He is alert and oriented to person, place, and time. Mental status is at baseline.     GCS: GCS eye subscore is 4. GCS verbal subscore is 5. GCS motor subscore is 6.     Cranial Nerves: Cranial nerves 2-12 are intact.     Sensory: Sensation is intact.     Motor: Tremor present.     Coordination: Coordination is intact.  Psychiatric:        Mood and Affect: Mood is anxious.        Speech: Speech normal.        Behavior: Behavior normal.        Thought Content: Thought content does not include homicidal or suicidal ideation.     Comments: Does not appear to be responding to internal stimuli at this time.      ED Results / Procedures / Treatments   Labs (all labs ordered are listed, but only abnormal results are displayed) Labs Reviewed  CBC WITH DIFFERENTIAL/PLATELET - Abnormal; Notable for the following components:      Result Value   MCV 101.2 (*)    MCH 34.5 (*)    All other components within normal limits  CBG MONITORING, ED - Abnormal; Notable for the following components:   Glucose-Capillary 116 (*)    All other components within normal limits  COMPREHENSIVE METABOLIC PANEL  ETHANOL  MAGNESIUM  RAPID URINE DRUG SCREEN, HOSP PERFORMED  ACETAMINOPHEN LEVEL  SALICYLATE LEVEL  TROPONIN I (HIGH SENSITIVITY)    EKG None  Radiology CT HEAD WO CONTRAST ( )  Result Date: 12/15/2022 CLINICAL DATA:  37 year old male with altered mental status.  ETOH withdrawal. Seizure and hallucinations. EXAM: CT HEAD WITHOUT CONTRAST TECHNIQUE: Contiguous axial images were obtained from the base of the skull through the vertex without intravenous contrast. RADIATION DOSE REDUCTION: This exam was performed according to the departmental dose-optimization program which includes automated exposure control, adjustment of the mA and/or kV according to patient size and/or use of iterative reconstruction technique. COMPARISON:  Brain MRI 04/25/2022.  Head CT 12/10/2022. FINDINGS: Brain: Age advanced cerebral volume loss appears to be chronic and not significantly changed from last year. This appears generalized. No midline shift, ventriculomegaly, mass effect, evidence of mass lesion,  intracranial hemorrhage or evidence of cortically based acute infarction. Gray-white matter differentiation is within normal limits throughout the brain. No discrete encephalomalacia identified. Vascular: No suspicious intracranial vascular hyperdensity. Skull: Scaphocephaly.  No acute osseous abnormality identified. Sinuses/Orbits: Visualized paranasal sinuses and mastoids are stable and well aerated. Other: Visualized orbits and scalp soft tissues are within normal limits. IMPRESSION: No acute intracranial abnormality.  Cerebral Atrophy (ICD10-G31.9). Electronically Signed   By: Genevie Ann M.D.   On: 12/15/2022 06:36    Procedures Procedures    Medications Ordered in ED Medications  thiamine (VITAMIN B1) tablet 100 mg (has no administration in time range)  folic acid (FOLVITE) tablet 1 mg (has no administration in time range)  ondansetron (ZOFRAN) injection 4 mg (4 mg Intravenous Given 12/15/22 0615)  LORazepam (ATIVAN) tablet 1 mg (1 mg Oral Given 12/15/22 0640)    ED Course/ Medical Decision Making/ A&P                             Medical Decision Making 37 year old male who presents with concern for ETOH withdrawal.   HTN on intake, VS otherwise normal, abdominal exam is  benign. CIWA 16 per discussion with patient. Cardiopulmonary exam is unremarkable, abdominal exam is benign. No traumatic finding on the head. Neurologically intact.   Amount and/or Complexity of Data Reviewed Labs: ordered.    Details: Normal CBG. Remaining laboratory studies pending.  Radiology: ordered.    Details: CT head visualized by this provider, negative for acute intracranial abnormality.   Risk OTC drugs. Prescription drug management.   Care of this patient signed out to oncoming ED provider Libby Maw, PA-C at time of shift change.  All pertinent HPI, physical exam and laboratory findings were discussed with him prior to my departure.  Patient overall is very well-appearing at this time despite reported CIWA of 16.  Pending laboratory studies and reevaluation by oncoming ED provider.  Disposition remains unclear and will be ultimately at the discretion of oncoming ED team following completion of workup and reevaluation.  Cinch voiced understanding of his medical evaluation and treatment plan. Each of their questions answered to their expressed satisfaction.  This chart was dictated using voice recognition software, Dragon. Despite the best efforts of this provider to proofread and correct errors, errors may still occur which can change documentation meaning.  Final Clinical Impression(s) / ED Diagnoses Final diagnoses:  None    Rx / DC Orders ED Discharge Orders     None         Aura Dials 12/15/22 4765    Merryl Hacker, MD 12/15/22 2316

## 2022-12-15 NOTE — ED Triage Notes (Signed)
Pt arrives with reports if ETOH withdrawal. Pt reports a seizure tonight, auditory hallucinations and chest pain. Pt reports he was recently admitted for withdrawal and had to leave due to his dad passing away. Pt reports drinking ETOH x24 hours ago. During triage pt began to have seizure where he came out of the chair and was assisted to ground. Pt seizure lasted approx 1 minute and pt came to on his on. Pt assisted to stretcher and taken to treatment room. Pt awake and alert.

## 2022-12-15 NOTE — H&P (Signed)
History and Physical  Adam [name] [employer]. DJT:[id number] DOB: Mar 11, 1986 DOA: 12/15/2022  PCP: Patient, No Pcp Per Patient coming from: home  I have personally briefly reviewed patient's old medical records in [place]   Chief Complaint: Hallucinations and formication  HPI: Adam [name]. is a 37 y.o. male past medical history of essential hypertension who recently left the hospital [place] for alcohol withdrawal went home and drank 1/5 of whiskey, his last drink was about 36 hours prior to admission comes in with alcohol withdrawal and seizures at home with auditory hallucinations visual hallucinations and formication's, nausea and vomiting not able to keep anything down.   Review of Systems: All systems reviewed and apart from history of presenting illness, are negative.  Past Medical History:  Diagnosis Date   Alcohol abuse    Eating disorder    history of bulemia   HYPERTENSION 06/25/2010   Seizure ([place])    ETOH Related Seizure   No past surgical history on file. Social History:  reports that he has quit smoking. He uses smokeless tobacco. He reports current alcohol use of about 70.0 standard drinks of alcohol per week. He reports that he does not currently use drugs.   Allergies  Allergen Reactions   Bee Venom Anaphylaxis    Family History  Problem Relation Age of Onset   Cancer Mother        lung   Hypertension Father     Prior to Admission medications   Medication Sig Start Date End Date Taking? Authorizing Provider  chlordiazePOXIDE (LIBRIUM) 25 MG capsule Take 1 capsule three times a day (AM-noon-PM) for 2 days, then taper to 25 mg (1 capsule) 2 times a day (AM and PM) for 1 day, then 25mg  (1 cap) once in the morning, then off Patient not taking: Reported on 12/11/2022 09/16/22   Rai, Vernelle Emerald, MD  folic acid (FOLVITE) 1 MG tablet Take 1 tablet (1 mg total) by mouth daily. Patient not taking: Reported on 12/11/2022  09/13/22   Rai, Vernelle Emerald, MD  gabapentin (NEURONTIN) 300 MG capsule Take 1 capsule (300 mg total) by mouth 2 (two) times daily. Patient not taking: Reported on 12/11/2022 09/13/22 10/13/22  Mendel Corning, MD  levETIRAcetam (KEPPRA) 500 MG tablet Take 1 tablet (500 mg total) by mouth 2 (two) times daily. Patient not taking: Reported on 12/11/2022 09/13/22   Rai, Vernelle Emerald, MD  lisinopril (ZESTRIL) 20 MG tablet Take 1 tablet (20 mg total) by mouth daily. 09/13/22   Rai, Vernelle Emerald, MD  mupirocin cream (BACTROBAN) 2 % Apply topically 2 (two) times daily. Apply to fingers/hand. Cream/Ointment or generic whichever is available. Patient not taking: Reported on 12/11/2022 09/13/22   Mendel Corning, MD  NONFORMULARY OR COMPOUNDED ITEM Please provide 1 week supply for the following dressing in his hands. Cover with single layer of xeroform (non adherent dressing). Wrap with conform gauze (1" or 2") change every other day 09/02/22   Thurnell Lose, MD  thiamine (VITAMIN B1) 100 MG tablet Take 1 tablet (100 mg total) by mouth daily. Patient not taking: Reported on 12/11/2022 09/13/22   Mendel Corning, MD   Physical Exam: Vitals:   12/15/22 0546 12/15/22 0604 12/15/22 0802  BP: (!) 159/109 (!) 160/101 136/73  Pulse: 99 90 88  Resp: 20 18 16   Temp: (!) 97.1 F (36.2 C)  97.6 F (36.4 C)  TempSrc:   Oral  SpO2: 99% 99% 98%  General exam: Moderately built and nourished patient, patient just vomited in the room Head, eyes and ENT: Nontraumatic and normocephalic.  Neck: Supple. No JVD, carotid bruit or thyromegaly. Lymphatics: No lymphadenopathy. Respiratory system: Clear to auscultation. No increased work of breathing. Cardiovascular system: S1 and S2 heard, RRR. No JVD. Gastrointestinal system: Abdomen is nondistended, soft and nontender. Normal bowel sounds heard. No organomegaly or masses appreciated. Central nervous system: Alert and oriented. No focal neurological deficits. Extremities:  Symmetric 5 x 5 power.  Tremulous Skin: No rashes or acute findings. Musculoskeletal system: Negative exam. Psychiatry: Pleasant and cooperative.   Labs on Admission:  Basic Metabolic Panel: Recent Labs  Lab 12/10/22 1142 12/11/22 0314 12/15/22 0604  NA 140 138 135  K 3.6 3.6 3.9  CL 104 103 100  CO2 24 25 23   GLUCOSE 84 76 113*  BUN 15 12 10   CREATININE 1.08 1.00 1.13  CALCIUM 9.6 8.9 9.4  MG 1.9 1.7 1.9  PHOS  --  3.1  --    Liver Function Tests: Recent Labs  Lab 12/10/22 1142 12/11/22 0314 12/15/22 0604  AST 126* 96* 77*  ALT 97* 83* 87*  ALKPHOS 49 49 54  BILITOT 0.7 1.1 0.6  PROT 6.7 6.4* 7.8  ALBUMIN 4.1 3.9 4.4   No results for input(s): "LIPASE", "AMYLASE" in the last 168 hours. No results for input(s): "AMMONIA" in the last 168 hours. CBC: Recent Labs  Lab 12/10/22 1142 12/11/22 0314 12/15/22 0604  WBC 4.0 2.9* 7.6  NEUTROABS 2.5 1.5* 5.0  HGB 13.4 12.8* 14.6  HCT 40.1 38.4* 42.8  MCV 100.3* 101.6* 101.2*  PLT 212 163 179   Cardiac Enzymes: No results for input(s): "CKTOTAL", "CKMB", "CKMBINDEX", "TROPONINI" in the last 168 hours.  BNP (last 3 results) No results for input(s): "PROBNP" in the last 8760 hours. CBG: Recent Labs  Lab 12/10/22 1139 12/15/22 0559  GLUCAP 96 116*    Radiological Exams on Admission: CT HEAD WO CONTRAST (12/12/22)  Result Date: 12/15/2022 CLINICAL DATA:  37 year old male with altered mental status. ETOH withdrawal. Seizure and hallucinations. EXAM: CT HEAD WITHOUT CONTRAST TECHNIQUE: Contiguous axial images were obtained from the base of the skull through the vertex without intravenous contrast. RADIATION DOSE REDUCTION: This exam was performed according to the departmental dose-optimization program which includes automated exposure control, adjustment of the mA and/or kV according to patient size and/or use of iterative reconstruction technique. COMPARISON:  Brain MRI 04/25/2022.  Head CT 12/10/2022. FINDINGS: Brain:  Age advanced cerebral volume loss appears to be chronic and not significantly changed from last year. This appears generalized. No midline shift, ventriculomegaly, mass effect, evidence of mass lesion, intracranial hemorrhage or evidence of cortically based acute infarction. Gray-white matter differentiation is within normal limits throughout the brain. No discrete encephalomalacia identified. Vascular: No suspicious intracranial vascular hyperdensity. Skull: Scaphocephaly.  No acute osseous abnormality identified. Sinuses/Orbits: Visualized paranasal sinuses and mastoids are stable and well aerated. Other: Visualized orbits and scalp soft tissues are within normal limits. IMPRESSION: No acute intracranial abnormality.  Cerebral Atrophy (ICD10-G31.9). Electronically Signed   By: 06/25/2022 M.D.   On: 12/15/2022 06:36    EKG: Independently reviewed.   Assessment/Plan Alcohol withdrawal delirium (HCC) With a CIWA score of 16. Normal IV fluid hydration recheck basic metabolic panel in the a.m., mag 1.9 phosphorus 3.1. Will start him on the CIWA protocol with Ativan will have low threshold to add Librium. Vomiting in the room very tremulous  Nausea and vomiting: Use Zofran  allowed full liquid diet.  Essential hypertension Creatinine stable blood pressure is elevated resume lisinopril    DVT Prophylaxis: lovenox Code Status: full  Family Communication: none  Disposition Plan: Inpatient  Time spent: 90 min    It is my clinical opinion that admission to [facility] is reasonable and necessary in this 37 y.o. male alcohol withdrawal with nausea and vomiting with an elevated CIWA score.  Given the aforementioned, the predictability of an adverse outcome is felt to be significant. I expect that the patient will require at least 2 midnights in the hospital to treat this condition.  Charlynne Cousins MD [facility]   12/15/2022, 9:47 AM

## 2022-12-15 NOTE — ED Notes (Signed)
Pt. Is asking for more ativan, pts. BP is 85/68, will speak with MD for recommendation

## 2022-12-16 LAB — BASIC METABOLIC PANEL
Anion gap: 7 (ref 5–15)
BUN: 14 mg/dL (ref 6–20)
CO2: 27 mmol/L (ref 22–32)
Calcium: 9.2 mg/dL (ref 8.9–10.3)
Chloride: 106 mmol/L (ref 98–111)
Creatinine, Ser: 0.89 mg/dL (ref 0.61–1.24)
GFR, Estimated: >60 mL/min (ref >60)
Glucose, Bld: 95 mg/dL (ref 70–99)
Potassium: 4.7 mmol/L (ref 3.5–5.1)
Sodium: 140 mmol/L (ref 135–145)

## 2022-12-16 LAB — CBC
HCT: 42.7 % (ref 39.0–52.0)
Hemoglobin: 14 g/dL (ref 13.0–17.0)
MCH: 33.7 pg (ref 26.0–34.0)
MCHC: 32.8 g/dL (ref 30.0–36.0)
MCV: 102.9 fL — ABNORMAL HIGH (ref 80.0–100.0)
Platelets: 170 10*3/uL (ref 150–400)
RBC: 4.15 MIL/uL — ABNORMAL LOW (ref 4.22–5.81)
RDW: 12.6 % (ref 11.5–15.5)
WBC: 4.2 10*3/uL (ref 4.0–10.5)
nRBC: 0 % (ref 0.0–0.2)

## 2022-12-16 MED ORDER — CHLORDIAZEPOXIDE HCL 25 MG PO CAPS: ORAL_CAPSULE | ORAL | 0 refills | Status: DC

## 2022-12-16 MED ORDER — CHLORDIAZEPOXIDE HCL 25 MG PO CAPS
50.0000 mg | ORAL_CAPSULE | Freq: Once | ORAL | Status: AC
  Administered 2022-12-16: 50 mg via ORAL
  Filled 2022-12-16: qty 2

## 2022-12-16 MED ORDER — LISINOPRIL 10 MG PO TABS: 20.0000 mg | ORAL_TABLET | Freq: Every day | ORAL | Status: DC

## 2022-12-16 MED ORDER — GABAPENTIN 300 MG PO CAPS: 300.0000 mg | ORAL_CAPSULE | Freq: Two times a day (BID) | ORAL | Status: DC

## 2022-12-16 NOTE — Discharge Instructions (Signed)
Adam [name] was admitted to the Hospital on 12/15/2022 and Discharged on Discharge Date 12/16/2022 and should be excused from work/school   for 3  days starting 12/15/2022 , may return to work/school without any restrictions.  Call Bess Harvest MD, [facility] [phone] with questions.  Charlynne Cousins M.D on 12/16/2022,at 7:32 AM  [facility] Office  [phone]

## 2022-12-16 NOTE — ED Notes (Signed)
Adam [name] rechecked scoring 4. Pt calm and cooperative with nursing care.

## 2022-12-16 NOTE — Discharge Summary (Signed)
Physician Discharge Summary  Adam Peoples Dehn Jr. WCB:[id number] DOB: 1985/12/03 DOA: 12/15/2022  PCP: Patient, No Pcp Per  Admit date: 12/15/2022 Discharge date: 12/16/2022  Admitted From: Home Disposition:  Home  Recommendations for Outpatient Follow-up:  Follow up with PCP in 1-2 weeks   Home Health:No Equipment/Devices:None  Discharge Condition:Stable CODE STATUS:Full Diet recommendation: Heart Healthy   Brief/Interim Summary:  37 y.o. male past medical history of essential hypertension who recently left the hospital AGAINST MEDICAL ADVICE for alcohol withdrawal went home and drank 1/5 of whiskey, his last drink was about 36 hours prior to admission comes in with alcohol withdrawal and seizures at home with auditory hallucinations visual hallucinations and formication's, nausea and vomiting not able to keep anything down   Discharge Diagnoses:  Principal Problem:   Alcohol withdrawal delirium (HCC) Active Problems:   Essential hypertension   Alcohol withdrawal delirium, acute, hyperactive (HCC)  Alcohol withdrawal with acute delirium: Experiencing formication hallucination. CIWA score was elevated he was aggressively fluid hydrated loaded with Ativan. Electrolytes were checked and repleted. Within 24 hours his symptoms did improve he was tolerated tolerate his diet which he was not on admission as he was vomiting. He was given a Librium taper and was discharged in stable condition.  Nausea and vomiting: This resolved within 24 hours of given IV Zofran regular diet.  Essential hypertension No change made to his medication he will resume them as an outpatient.  Discharge Instructions  Discharge Instructions     Diet - low sodium heart healthy   Complete by: As directed    Increase activity slowly   Complete by: As directed       Allergies as of 12/16/2022       Reactions   Bee Venom Anaphylaxis        Medication List     TAKE these medications     chlordiazePOXIDE 25 MG capsule Commonly known as: LIBRIUM Take 1 tab 4 times a day for 1 day, then 1 tab 3 times a day, then take 1 tab twice daily for 1 day, then on the last day 1 tab daily for 1 day What changed: additional instructions   folic acid 1 MG tablet Commonly known as: FOLVITE Take 1 tablet (1 mg total) by mouth daily.   gabapentin 300 MG capsule Commonly known as: Neurontin Take 1 capsule (300 mg total) by mouth 2 (two) times daily.   lisinopril 20 MG tablet Commonly known as: ZESTRIL Take 1 tablet (20 mg total) by mouth daily.   thiamine 100 MG tablet Commonly known as: VITAMIN B1 Take 1 tablet (100 mg total) by mouth daily.        Allergies  Allergen Reactions   Bee Venom Anaphylaxis    Consultations: None   Procedures/Studies: CT HEAD WO CONTRAST ( )  Result Date: 12/15/2022 CLINICAL DATA:  37 year old male with altered mental status. ETOH withdrawal. Seizure and hallucinations. EXAM: CT HEAD WITHOUT CONTRAST TECHNIQUE: Contiguous axial images were obtained from the base of the skull through the vertex without intravenous contrast. RADIATION DOSE REDUCTION: This exam was performed according to the departmental dose-optimization program which includes automated exposure control, adjustment of the mA and/or kV according to patient size and/or use of iterative reconstruction technique. COMPARISON:  Brain MRI 04/25/2022.  Head CT 12/10/2022. FINDINGS: Brain: Age advanced cerebral volume loss appears to be chronic and not significantly changed from last year. This appears generalized. No midline shift, ventriculomegaly, mass effect, evidence of mass lesion, intracranial hemorrhage or evidence  of cortically based acute infarction. Gray-white matter differentiation is within normal limits throughout the brain. No discrete encephalomalacia identified. Vascular: No suspicious intracranial vascular hyperdensity. Skull: Scaphocephaly.  No acute osseous abnormality  identified. Sinuses/Orbits: Visualized paranasal sinuses and mastoids are stable and well aerated. Other: Visualized orbits and scalp soft tissues are within normal limits. IMPRESSION: No acute intracranial abnormality.  Cerebral Atrophy (ICD10-G31.9). Electronically Signed   By: Genevie Ann M.D.   On: 12/15/2022 06:36   CT HEAD WO CONTRAST (5MM)  Result Date: 12/10/2022 CLINICAL DATA:  Head trauma, focal neuro findings.  Seizures. EXAM: CT HEAD WITHOUT CONTRAST TECHNIQUE: Contiguous axial images were obtained from the base of the skull through the vertex without intravenous contrast. RADIATION DOSE REDUCTION: This exam was performed according to the departmental dose-optimization program which includes automated exposure control, adjustment of the mA and/or kV according to patient size and/or use of iterative reconstruction technique. COMPARISON:  Head CT 09/12/2022. FINDINGS: Brain: No acute hemorrhage, mass effect or midline shift. Gray-white differentiation is preserved. No hydrocephalus. No extra-axial collection. Basilar cisterns are patent. Vascular: No hyperdense vessel or unexpected calcification. Skull: No calvarial fracture or suspicious bone lesion. Skull base is unremarkable. Sinuses/Orbits: Paranasal sinuses, mastoid air cells, and middle ear cavities are well aerated. Orbits are unremarkable. Other: None. IMPRESSION: No acute intracranial abnormality. Electronically Signed   By: Emmit Alexanders M.D.   On: 12/10/2022 15:52   (Echo, Carotid, EGD, Colonoscopy, ERCP)    Subjective: No complaints  Discharge Exam: Vitals:   12/16/22 0515 12/16/22 0516  BP: (!) 126/91 (!) 126/91  Pulse: 82 82  Resp: 18   Temp: 98.2 F (36.8 C)   SpO2: 98%    Vitals:   12/16/22 0030 12/16/22 0245 12/16/22 0515 12/16/22 0516  BP: (!) 149/84 102/68 (!) 126/91 (!) 126/91  Pulse: (!) 103 92 82 82  Resp: (!) 24 15 18    Temp:   98.2 F (36.8 C)   TempSrc:   Oral   SpO2: 93% 92% 98%   Weight:      Height:         General: Pt is alert, awake, not in acute distress Cardiovascular: RRR, S1/S2 +, no rubs, no gallops Respiratory: CTA bilaterally, no wheezing, no rhonchi Abdominal: Soft, NT, ND, bowel sounds + Extremities: no edema, no cyanosis    The results of significant diagnostics from this hospitalization (including imaging, microbiology, ancillary and laboratory) are listed below for reference.     Microbiology: Recent Results (from the past 240 hour(s))  Resp panel by RT-PCR (RSV, Flu A&B, Covid) Urine, Clean Catch     Status: None   Collection Time: 12/10/22  2:38 PM   Specimen: Urine, Clean Catch; Nasal Swab  Result Value Ref Range Status   SARS Coronavirus 2 by RT PCR NEGATIVE NEGATIVE Final    Comment: (NOTE) SARS-CoV-2 target nucleic acids are NOT DETECTED.  The SARS-CoV-2 RNA is generally detectable in upper respiratory specimens during the acute phase of infection. The lowest concentration of SARS-CoV-2 viral copies this assay can detect is 138 copies/mL. A negative result does not preclude SARS-Cov-2 infection and should not be used as the sole basis for treatment or other patient management decisions. A negative result may occur with  improper specimen collection/handling, submission of specimen other than nasopharyngeal swab, presence of viral mutation(s) within the areas targeted by this assay, and inadequate number of viral copies(<138 copies/mL). A negative result must be combined with clinical observations, patient history, and epidemiological information. The  expected result is Negative.  Fact Sheet for Patients:  BloggerCourse.com  Fact Sheet for Healthcare Providers:  SeriousBroker.it  This test is no t yet approved or cleared by the [place] FDA and  has been authorized for detection and/or diagnosis of SARS-CoV-2 by FDA under an Emergency Use Authorization (EUA). This EUA will remain  in effect  (meaning this test can be used) for the duration of the COVID-19 declaration under Section 564(b)(1) of the Act, 21 U.S.C.section 360bbb-3(b)(1), unless the authorization is terminated  or revoked sooner.       Influenza A by PCR NEGATIVE NEGATIVE Final   Influenza B by PCR NEGATIVE NEGATIVE Final    Comment: (NOTE) The Xpert Xpress SARS-CoV-2/FLU/RSV plus assay is intended as an aid in the diagnosis of influenza from Nasopharyngeal swab specimens and should not be used as a sole basis for treatment. Nasal washings and aspirates are unacceptable for Xpert Xpress SARS-CoV-2/FLU/RSV testing.  Fact Sheet for Patients: BloggerCourse.com  Fact Sheet for Healthcare Providers: SeriousBroker.it  This test is not yet approved or cleared by the [place] FDA and has been authorized for detection and/or diagnosis of SARS-CoV-2 by FDA under an Emergency Use Authorization (EUA). This EUA will remain in effect (meaning this test can be used) for the duration of the COVID-19 declaration under Section 564(b)(1) of the Act, 21 U.S.C. section 360bbb-3(b)(1), unless the authorization is terminated or revoked.     Resp Syncytial Virus by PCR NEGATIVE NEGATIVE Final    Comment: (NOTE) Fact Sheet for Patients: BloggerCourse.com  Fact Sheet for Healthcare Providers: SeriousBroker.it  This test is not yet approved or cleared by the [place] FDA and has been authorized for detection and/or diagnosis of SARS-CoV-2 by FDA under an Emergency Use Authorization (EUA). This EUA will remain in effect (meaning this test can be used) for the duration of the COVID-19 declaration under Section 564(b)(1) of the Act, 21 U.S.C. section 360bbb-3(b)(1), unless the authorization is terminated or revoked.  Performed at [facility], [address], [place], [place] [address]   MRSA  Next Gen by PCR, Nasal     Status: None   Collection Time: 12/10/22  4:49 PM   Specimen: Nasal Mucosa; Nasal Swab  Result Value Ref Range Status   MRSA by PCR Next Gen NOT DETECTED NOT DETECTED Final    Comment: (NOTE) The GeneXpert MRSA Assay (FDA approved for NASAL specimens only), is one component of a comprehensive MRSA colonization surveillance program. It is not intended to diagnose MRSA infection nor to guide or monitor treatment for MRSA infections. Test performance is not FDA approved in patients less than 54 years old. Performed at [facility], [address], [place], [place] [address]      Labs: BNP (last 3 results) Recent Labs    09/01/22 0248 09/02/22 0309 12/10/22 1648  BNP 109.9* 45.4 15.0   Basic Metabolic Panel: Recent Labs  Lab 12/10/22 1142 12/11/22 0314 12/15/22 0604 12/16/22 0544  NA 140 138 135 140  K 3.6 3.6 3.9 4.7  CL 104 103 100 106  CO2 24 25 23 27   GLUCOSE 84 76 113* 95  BUN 15 12 10 14   CREATININE 1.08 1.00 1.13 0.89  CALCIUM 9.6 8.9 9.4 9.2  MG 1.9 1.7 1.8  1.9  --   PHOS  --  3.1 3.4  --    Liver Function Tests: Recent Labs  Lab 12/10/22 1142 12/11/22 0314 12/15/22 0604  AST 126* 96* 77*  ALT 97* 83* 87*  ALKPHOS 49 49 54  BILITOT 0.7 1.1 0.6  PROT 6.7 6.4* 7.8  ALBUMIN 4.1 3.9 4.4   No results for input(s): "LIPASE", "AMYLASE" in the last 168 hours. No results for input(s): "AMMONIA" in the last 168 hours. CBC: Recent Labs  Lab 12/10/22 1142 12/11/22 0314 12/15/22 0604 12/16/22 0544  WBC 4.0 2.9* 7.6 4.2  NEUTROABS 2.5 1.5* 5.0  --   HGB 13.4 12.8* 14.6 14.0  HCT 40.1 38.4* 42.8 42.7  MCV 100.3* 101.6* 101.2* 102.9*  PLT 212 163 179 170   Cardiac Enzymes: No results for input(s): "CKTOTAL", "CKMB", "CKMBINDEX", "TROPONINI" in the last 168 hours. BNP: Invalid input(s): "POCBNP" CBG: Recent Labs  Lab 12/10/22 1139 12/15/22 0559  GLUCAP 96 116*   D-Dimer No results for input(s):  "DDIMER" in the last 72 hours. Hgb A1c No results for input(s): "HGBA1C" in the last 72 hours. Lipid Profile No results for input(s): "CHOL", "HDL", "LDLCALC", "TRIG", "CHOLHDL", "LDLDIRECT" in the last 72 hours. Thyroid function studies No results for input(s): "TSH", "T4TOTAL", "T3FREE", "THYROIDAB" in the last 72 hours.  Invalid input(s): "FREET3" Anemia work up No results for input(s): "VITAMINB12", "FOLATE", "FERRITIN", "TIBC", "IRON", "RETICCTPCT" in the last 72 hours. Urinalysis    Component Value Date/Time   COLORURINE YELLOW 08/30/2022 1744   APPEARANCEUR CLEAR 08/30/2022 1744   LABSPEC 1.018 08/30/2022 1744   PHURINE 6.0 08/30/2022 1744   GLUCOSEU 50 (A) 08/30/2022 1744   HGBUR NEGATIVE 08/30/2022 1744   BILIRUBINUR NEGATIVE 08/30/2022 1744   KETONESUR NEGATIVE 08/30/2022 1744   PROTEINUR NEGATIVE 08/30/2022 1744   NITRITE NEGATIVE 08/30/2022 1744   LEUKOCYTESUR NEGATIVE 08/30/2022 1744   Sepsis Labs Recent Labs  Lab 12/10/22 1142 12/11/22 0314 12/15/22 0604 12/16/22 0544  WBC 4.0 2.9* 7.6 4.2   Microbiology Recent Results (from the past 240 hour(s))  Resp panel by RT-PCR (RSV, Flu A&B, Covid) Urine, Clean Catch     Status: None   Collection Time: 12/10/22  2:38 PM   Specimen: Urine, Clean Catch; Nasal Swab  Result Value Ref Range Status   SARS Coronavirus 2 by RT PCR NEGATIVE NEGATIVE Final    Comment: (NOTE) SARS-CoV-2 target nucleic acids are NOT DETECTED.  The SARS-CoV-2 RNA is generally detectable in upper respiratory specimens during the acute phase of infection. The lowest concentration of SARS-CoV-2 viral copies this assay can detect is 138 copies/mL. A negative result does not preclude SARS-Cov-2 infection and should not be used as the sole basis for treatment or other patient management decisions. A negative result may occur with  improper specimen collection/handling, submission of specimen other than nasopharyngeal swab, presence of viral  mutation(s) within the areas targeted by this assay, and inadequate number of viral copies(<138 copies/mL). A negative result must be combined with clinical observations, patient history, and epidemiological information. The expected result is Negative.  Fact Sheet for Patients:  BloggerCourse.com  Fact Sheet for Healthcare Providers:  SeriousBroker.it  This test is no t yet approved or cleared by the [place] FDA and  has been authorized for detection and/or diagnosis of SARS-CoV-2 by FDA under an Emergency Use Authorization (EUA). This EUA will remain  in effect (meaning this test can be used) for the duration of the COVID-19 declaration under Section 564(b)(1) of the Act, 21 U.S.C.section 360bbb-3(b)(1), unless the authorization is terminated  or revoked sooner.       Influenza A by PCR NEGATIVE NEGATIVE Final   Influenza B by PCR NEGATIVE NEGATIVE  Final    Comment: (NOTE) The Xpert Xpress SARS-CoV-2/FLU/RSV plus assay is intended as an aid in the diagnosis of influenza from Nasopharyngeal swab specimens and should not be used as a sole basis for treatment. Nasal washings and aspirates are unacceptable for Xpert Xpress SARS-CoV-2/FLU/RSV testing.  Fact Sheet for Patients: EntrepreneurPulse.com.au  Fact Sheet for Healthcare Providers: IncredibleEmployment.be  This test is not yet approved or cleared by the [place] FDA and has been authorized for detection and/or diagnosis of SARS-CoV-2 by FDA under an Emergency Use Authorization (EUA). This EUA will remain in effect (meaning this test can be used) for the duration of the COVID-19 declaration under Section 564(b)(1) of the Act, 21 U.S.C. section 360bbb-3(b)(1), unless the authorization is terminated or revoked.     Resp Syncytial Virus by PCR NEGATIVE NEGATIVE Final    Comment: (NOTE) Fact Sheet for  Patients: EntrepreneurPulse.com.au  Fact Sheet for Healthcare Providers: IncredibleEmployment.be  This test is not yet approved or cleared by the [place] FDA and has been authorized for detection and/or diagnosis of SARS-CoV-2 by FDA under an Emergency Use Authorization (EUA). This EUA will remain in effect (meaning this test can be used) for the duration of the COVID-19 declaration under Section 564(b)(1) of the Act, 21 U.S.C. section 360bbb-3(b)(1), unless the authorization is terminated or revoked.  Performed at [facility], [place] [address] [place], [place] [address]   MRSA Next Gen by PCR, Nasal     Status: None   Collection Time: 12/10/22  4:49 PM   Specimen: Nasal Mucosa; Nasal Swab  Result Value Ref Range Status   MRSA by PCR Next Gen NOT DETECTED NOT DETECTED Final    Comment: (NOTE) The GeneXpert MRSA Assay (FDA approved for NASAL specimens only), is one component of a comprehensive MRSA colonization surveillance program. It is not intended to diagnose MRSA infection nor to guide or monitor treatment for MRSA infections. Test performance is not FDA approved in patients less than 14 years old. Performed at [facility], [place] [address], [place], [place] [address]     SIGNED:   Charlynne Cousins, MD  [facility] 12/16/2022, 7:29 AM Pager   If 7PM-7AM, please contact night-coverage [email or web address] Password [email or web address]

## 2023-01-06 ENCOUNTER — Inpatient Hospital Stay
Admission: EM | Admit: 2023-01-06 | Discharge: 2023-01-07 | DRG: 894 | Discharge status: Against Medical Advice | Payer: Medicaid Other | Attending: Internal Medicine | Admitting: Internal Medicine

## 2023-01-06 ENCOUNTER — Encounter: Payer: Self-pay | Admitting: Emergency Medicine

## 2023-01-06 DIAGNOSIS — R569 Unspecified convulsions: Secondary | ICD-10-CM | POA: Diagnosis present

## 2023-01-06 DIAGNOSIS — Z8249 Family history of ischemic heart disease and other diseases of the circulatory system: Secondary | ICD-10-CM

## 2023-01-06 DIAGNOSIS — F17290 Nicotine dependence, other tobacco product, uncomplicated: Secondary | ICD-10-CM | POA: Diagnosis present

## 2023-01-06 DIAGNOSIS — Z79899 Other long term (current) drug therapy: Secondary | ICD-10-CM

## 2023-01-06 DIAGNOSIS — F10229 Alcohol dependence with intoxication, unspecified: Secondary | ICD-10-CM | POA: Diagnosis present

## 2023-01-06 DIAGNOSIS — F10232 Alcohol dependence with withdrawal with perceptual disturbance: Principal | ICD-10-CM | POA: Diagnosis present

## 2023-01-06 DIAGNOSIS — R7401 Elevation of levels of liver transaminase levels: Secondary | ICD-10-CM | POA: Diagnosis present

## 2023-01-06 DIAGNOSIS — F10939 Alcohol use, unspecified with withdrawal, unspecified: Secondary | ICD-10-CM | POA: Diagnosis present

## 2023-01-06 DIAGNOSIS — Z8659 Personal history of other mental and behavioral disorders: Secondary | ICD-10-CM

## 2023-01-06 DIAGNOSIS — Y908 Blood alcohol level of 240 mg/100 ml or more: Secondary | ICD-10-CM | POA: Diagnosis present

## 2023-01-06 DIAGNOSIS — Z91030 Bee allergy status: Secondary | ICD-10-CM

## 2023-01-06 DIAGNOSIS — I10 Essential (primary) hypertension: Secondary | ICD-10-CM | POA: Diagnosis present

## 2023-01-06 MED ORDER — THIAMINE HCL 100 MG/ML IJ SOLN
100.0000 mg | Freq: Once | INTRAMUSCULAR | Status: AC
  Administered 2023-01-06: 100 mg via INTRAVENOUS
  Filled 2023-01-06: qty 2

## 2023-01-06 MED ORDER — LORAZEPAM 2 MG PO TABS
0.0000 mg | ORAL_TABLET | Freq: Four times a day (QID) | ORAL | Status: DC
  Administered 2023-01-07: 1 mg via ORAL
  Filled 2023-01-06: qty 1

## 2023-01-06 MED ORDER — LORAZEPAM 2 MG PO TABS: 0.0000 mg | ORAL_TABLET | Freq: Two times a day (BID) | ORAL | Status: DC

## 2023-01-06 MED ORDER — LORAZEPAM 2 MG/ML IJ SOLN
0.0000 mg | Freq: Four times a day (QID) | INTRAMUSCULAR | Status: DC
  Administered 2023-01-06: 2 mg via INTRAVENOUS
  Administered 2023-01-07: 1 mg via INTRAVENOUS
  Filled 2023-01-06 (×2): qty 1

## 2023-01-06 MED ORDER — LACTATED RINGERS IV BOLUS
1000.0000 mL | Freq: Once | INTRAVENOUS | Status: AC
  Administered 2023-01-06: 1000 mL via INTRAVENOUS

## 2023-01-06 MED ORDER — LORAZEPAM 2 MG/ML IJ SOLN: 0.0000 mg | Freq: Two times a day (BID) | INTRAMUSCULAR | Status: DC

## 2023-01-06 MED ORDER — SODIUM CHLORIDE 0.9 % IV SOLN
1.0000 mg | Freq: Once | INTRAVENOUS | Status: AC
  Administered 2023-01-07: 1 mg via INTRAVENOUS
  Filled 2023-01-06: qty 0.2

## 2023-01-06 NOTE — ED Triage Notes (Signed)
Pt presents via POV with complaints of a seizure that occurred ~ 1 hour ago. Pt unsure how long it lasted - states his wife saw it. Unable to contact the wife because she is "asleep". Pt is visibly intoxicated. Stating he drinks a 5th of bourbon daily but has been trying to detox at home over the last 14 hours. No tremors or hallucinations. Pt states he takes "gabapentin for me seizures." Denies CP or SOB.

## 2023-01-06 NOTE — ED Provider Notes (Addendum)
[facility] Provider Note    Event Date/Time   First MD Initiated Contact with Patient 01/06/23 2332     (approximate)   History   Seizures   HPI  Adam [name]. is a 37 y.o. male with history of alcohol abuse, hypertension, previous history of alcohol withdrawal seizures and DTs requiring admission to the hospital who presents to the emergency department stating that he thinks he is withdrawing.  States he is seeing things and hearing things.  Tells me that his last alcoholic beverage was 12 hours ago.  Told nursing staff that he was seizing and then laid down on the floor and began shaking but was awake and alert with no postictal state afterwards.  Denies drug use.  Denies any pain.  Patient drove himself to the emergency department from [place] while intoxicated.  He lives at home with his wife and 40-monthold son Adam [name]   History provided by patient.    Past Medical History:  Diagnosis Date   Alcohol abuse    Eating disorder    history of bulemia   HYPERTENSION 06/25/2010   [place]    ETOH Related Seizure    History reviewed. No pertinent surgical history.  MEDICATIONS:  Prior to Admission medications   Medication Sig Start Date End Date Taking? Authorizing Provider  chlordiazePOXIDE (LIBRIUM) 25 MG capsule Take 1 tab 4 times a day for 1 day, then 1 tab 3 times a day, then take 1 tab twice daily for 1 day, then on the last day 1 tab daily for 1 day 12/16/22   FCharlynne Cousins MD  folic acid (FOLVITE) 1 MG tablet Take 1 tablet (1 mg total) by mouth daily. Patient not taking: Reported on 12/11/2022 09/13/22   Rai, RVernelle Emerald MD  gabapentin (NEURONTIN) 300 MG capsule Take 1 capsule (300 mg total) by mouth 2 (two) times daily. Patient not taking: Reported on 12/15/2022 09/13/22 12/15/22  Rai, RVernelle Emerald MD  lisinopril (ZESTRIL) 20 MG tablet Take 1 tablet (20 mg total) by mouth daily. 09/13/22   Rai, RVernelle Emerald MD   thiamine (VITAMIN B1) 100 MG tablet Take 1 tablet (100 mg total) by mouth daily. Patient not taking: Reported on 12/11/2022 09/13/22   RMendel Corning MD    Physical Exam   Triage Vital Signs: ED Triage Vitals [01/06/23 2324]  Enc Vitals Group     BP (!) 145/96     Pulse Rate (!) 103     Resp 18     Temp 98.2 F (36.8 C)     Temp Source Oral     SpO2 98 %     Weight 171 lb 15.3 oz (78 kg)     Height 5' 7"$  (1.702 m)     Head Circumference      Peak Flow      Pain Score 0     Pain Loc      Pain Edu?      [place]     Most recent vital signs: Vitals:   01/07/23 0429 01/07/23 0516  BP: 112/78 105/64  Pulse: 100 91  Resp: 17   Temp:    SpO2: 100%     CONSTITUTIONAL: Alert, responds appropriately to questions.  Tearful, significantly intoxicated HEAD: Normocephalic, atraumatic EYES: Conjunctivae clear, pupils appear equal, sclera nonicteric ENT: normal nose; moist mucous membranes NECK: Supple, normal ROM CARD: Regular and tachycardic; S1 and S2 appreciated RESP: Normal chest excursion without splinting  or tachypnea; breath sounds clear and equal bilaterally; no wheezes, no rhonchi, no rales, no hypoxia or respiratory distress, speaking full sentences ABD/GI: Non-distended; soft, non-tender, no rebound, no guarding, no peritoneal signs BACK: The back appears normal EXT: Normal ROM in all joints; no deformity noted, no edema SKIN: Normal color for age and race; warm; no rash on exposed skin NEURO: Moves all extremities equally, slightly slurred speech, no aphasia, no facial asymmetry, no tremors PSYCH: The patient's mood and manner are appropriate.   ED Results / Procedures / Treatments   LABS: (all labs ordered are listed, but only abnormal results are displayed) Labs Reviewed  COMPREHENSIVE METABOLIC PANEL - Abnormal; Notable for the following components:      Result Value   AST 56 (*)    ALT 45 (*)    All other components within normal limits  ETHANOL -  Abnormal; Notable for the following components:   Alcohol, Ethyl (B) 364 (*)    All other components within normal limits  CBC WITH DIFFERENTIAL/PLATELET  MAGNESIUM  URINE DRUG SCREEN, QUALITATIVE ([facility] ONLY)  URINALYSIS, ROUTINE W REFLEX MICROSCOPIC     EKG:   RADIOLOGY: My personal review and interpretation of imaging:    I have personally reviewed all radiology reports.   No results found.   PROCEDURES:  Critical Care performed: Yes, see critical care procedure note(s)   CRITICAL CARE Performed by: Pryor Curia   Total critical care time: 40 minutes  Critical care time was exclusive of separately billable procedures and treating other patients.  Critical care was necessary to treat or prevent imminent or life-threatening deterioration.  Critical care was time spent personally by me on the following activities: development of treatment plan with patient and/or surrogate as well as nursing, discussions with consultants, evaluation of patient's response to treatment, examination of patient, obtaining history from patient or surrogate, ordering and performing treatments and interventions, ordering and review of laboratory studies, ordering and review of radiographic studies, pulse oximetry and re-evaluation of patient's condition.   Marland Kitchen1-3 Lead EKG Interpretation  Performed by: Ramari Bray, Delice Bison, DO Authorized by: Aarib Pulido, Delice Bison, DO     Interpretation: abnormal     ECG rate:  103   ECG rate assessment: tachycardic     Rhythm: sinus tachycardia     Ectopy: none     Conduction: normal       IMPRESSION / MDM / ASSESSMENT AND PLAN / ED COURSE  I reviewed the triage vital signs and the nursing notes.    Patient here with alcohol intoxication and states that he is having visual and auditory hallucinations.  The patient is on the cardiac monitor to evaluate for evidence of arrhythmia and/or significant heart rate changes.   DIFFERENTIAL DIAGNOSIS (includes but not  limited to):   Alcohol intoxication, withdrawal, electrolyte derangement   Patient's presentation is most consistent with acute presentation with potential threat to life or bodily function.   PLAN: Will obtain CBC, CMP, magnesium level, urine drug screen.  Will place on CIWA protocol and give IV fluids, thiamine and folic acid.  Will monitor closely given patient does have significant history of complex withdrawal requiring [facility] previously.  Frequently leaves AMA.   MEDICATIONS GIVEN IN ED: Medications  LORazepam (ATIVAN) injection 0-4 mg (0 mg Intravenous Hold 01/07/23 0538)    Or  LORazepam (ATIVAN) tablet 0-4 mg ( Oral See Alternative 01/07/23 0538)  LORazepam (ATIVAN) injection 0-4 mg (has no administration in time range)  Or  LORazepam (ATIVAN) tablet 0-4 mg (has no administration in time range)  lactated ringers infusion ( Intravenous New Bag/Given 01/07/23 0124)  levETIRAcetam (KEPPRA) IVPB 1000 mg/100 mL premix (has no administration in time range)  ketorolac (TORADOL) 30 MG/ML injection 30 mg (has no administration in time range)  ondansetron (ZOFRAN) injection 4 mg (has no administration in time range)  thiamine (VITAMIN B1) injection 100 mg (100 mg Intravenous Given [id number])  folic acid 1 mg in sodium chloride 0.9 % 50 mL IVPB (0 mg Intravenous Stopped 01/07/23 0359)  lactated ringers bolus 1,000 mL (0 mLs Intravenous Stopped 01/07/23 0117)     ED COURSE: Patient's hemoglobin is normal.  Normal electrolytes.  AST and ALT elevated but similar compared to previous.  Alcohol level of 364.  CIWA here is 12.  Patient will likely need admission given he already is reporting visual disturbances however it looks like patient leaves the hospital [place] often once he is more sober.  Will reassess once alcohol level has declined some so that we can talk to him further about goals of care.  4:47 AM  Pt sleeping comfortably but arousable.   No tremors.  Normal vital signs.   6:03 AM  Pt awake and ambulatory to the bathroom.  States he is still having hallucinations.  Slightly tremulous now and intermittently tachycardic.  Will give more IV Ativan for CIWA of 6 about 30 minutes ago.  Patient states that he wants admission to the hospital for treatment and that the last few times he left early or AMA was because his father recently passed away.  Will discuss with the hospitalist.  No indication that he needs ICU or Precedex at this time.  It looks like during previous admissions they have had him on Keppra.  Will give him a gram IV as a prophylaxis here.   CONSULTS:  Consulted and discussed patient's case with hospitalist, Dr. Damita Dunnings.  I have recommended admission and consulting physician agrees and will place admission orders.  Patient (and family if present) agree with this plan.   I reviewed all nursing notes, vitals, pertinent previous records.  All labs, EKGs, imaging ordered have been independently reviewed and interpreted by myself.    OUTSIDE RECORDS REVIEWED: Reviewed last admission to the hospital 12/10/2022.    6:30 AM  Attempted to call wife at [phone] x [phone] but phone does not ring, no answer.   7:05 AM  Nursing staff expressed concern that patient was drinking alcohol in the room when she stepped out of the room as he refused to open his book bag while she was in the room.  Myself and charge nurse went to talk to patient together with security present.  Discussed with him that if he is drinking alcohol while also receiving benzodiazepines that this could be a lethal combination for him.  He verbalized understanding and gave [place] permission to look through his book bag.  We did find a large open bottle of body armor that seem to be replaced with what smelled like a very strong brown liquor.  We offered to hold onto this for him versus dumping out and he stated that we could dump the bottle out in the sink.  No other signs  of alcohol seen within the bag.  Patient still agreeable to stay and is calm, cooperative at this time.  Expressed understanding of why we had to dispose of the bottle of alcohol.  FINAL CLINICAL IMPRESSION(S) / ED DIAGNOSES  Final diagnoses:  Alcohol dependence with withdrawal with perceptual disturbance ([place])     Rx / DC Orders   ED Discharge Orders     None        Note:  This document was prepared using Dragon voice recognition software and may include unintentional dictation errors.       Ky Rumple, Delice Bison, DO 01/07/23 [phone]

## 2023-01-06 NOTE — ED Notes (Signed)
Pt laid himself in the floor in the triage room stating he was feeling tired. He spit on the floor stating he was having a seizure. Pt was alert during the episode - no airway compromise nor incontinence. Pt stood up and got into the wheelchair and taken to a bed for evaluation.

## 2023-01-07 ENCOUNTER — Other Ambulatory Visit: Payer: Self-pay

## 2023-01-07 ENCOUNTER — Encounter: Payer: Self-pay | Admitting: Internal Medicine

## 2023-01-07 DIAGNOSIS — R7401 Elevation of levels of liver transaminase levels: Secondary | ICD-10-CM | POA: Diagnosis present

## 2023-01-07 DIAGNOSIS — Z8659 Personal history of other mental and behavioral disorders: Secondary | ICD-10-CM | POA: Diagnosis not present

## 2023-01-07 DIAGNOSIS — R569 Unspecified convulsions: Secondary | ICD-10-CM | POA: Diagnosis not present

## 2023-01-07 DIAGNOSIS — F10931 Alcohol use, unspecified with withdrawal delirium: Secondary | ICD-10-CM | POA: Insufficient documentation

## 2023-01-07 DIAGNOSIS — F10932 Alcohol use, unspecified with withdrawal with perceptual disturbance: Secondary | ICD-10-CM

## 2023-01-07 DIAGNOSIS — Z79899 Other long term (current) drug therapy: Secondary | ICD-10-CM | POA: Diagnosis not present

## 2023-01-07 DIAGNOSIS — Z8249 Family history of ischemic heart disease and other diseases of the circulatory system: Secondary | ICD-10-CM | POA: Diagnosis not present

## 2023-01-07 DIAGNOSIS — F17290 Nicotine dependence, other tobacco product, uncomplicated: Secondary | ICD-10-CM | POA: Diagnosis present

## 2023-01-07 DIAGNOSIS — F10232 Alcohol dependence with withdrawal with perceptual disturbance: Secondary | ICD-10-CM | POA: Diagnosis present

## 2023-01-07 DIAGNOSIS — Z91030 Bee allergy status: Secondary | ICD-10-CM | POA: Diagnosis not present

## 2023-01-07 DIAGNOSIS — F10229 Alcohol dependence with intoxication, unspecified: Secondary | ICD-10-CM | POA: Diagnosis present

## 2023-01-07 DIAGNOSIS — Y908 Blood alcohol level of 240 mg/100 ml or more: Secondary | ICD-10-CM | POA: Diagnosis present

## 2023-01-07 DIAGNOSIS — I10 Essential (primary) hypertension: Secondary | ICD-10-CM | POA: Diagnosis present

## 2023-01-07 LAB — URINE DRUG SCREEN, QUALITATIVE (ARMC ONLY)
Amphetamines, Ur Screen: NOT DETECTED
Barbiturates, Ur Screen: POSITIVE — AB
Benzodiazepine, Ur Scrn: POSITIVE — AB
Cannabinoid 50 Ng, Ur ~~LOC~~: NOT DETECTED
Cocaine Metabolite,Ur ~~LOC~~: NOT DETECTED
MDMA (Ecstasy)Ur Screen: NOT DETECTED
Methadone Scn, Ur: NOT DETECTED
Opiate, Ur Screen: NOT DETECTED
Phencyclidine (PCP) Ur S: NOT DETECTED
Tricyclic, Ur Screen: NOT DETECTED

## 2023-01-07 LAB — COMPREHENSIVE METABOLIC PANEL
ALT: 45 U/L — ABNORMAL HIGH (ref 0–44)
AST: 56 U/L — ABNORMAL HIGH (ref 15–41)
Albumin: 4.9 g/dL (ref 3.5–5.0)
Alkaline Phosphatase: 50 U/L (ref 38–126)
Anion gap: 14 (ref 5–15)
BUN: 17 mg/dL (ref 6–20)
CO2: 23 mmol/L (ref 22–32)
Calcium: 9.7 mg/dL (ref 8.9–10.3)
Chloride: 102 mmol/L (ref 98–111)
Creatinine, Ser: 0.9 mg/dL (ref 0.61–1.24)
GFR, Estimated: >60 mL/min (ref >60)
Glucose, Bld: 89 mg/dL (ref 70–99)
Potassium: 3.7 mmol/L (ref 3.5–5.1)
Sodium: 139 mmol/L (ref 135–145)
Total Bilirubin: 0.6 mg/dL (ref 0.3–1.2)
Total Protein: 7.8 g/dL (ref 6.5–8.1)

## 2023-01-07 LAB — CBC WITH DIFFERENTIAL/PLATELET
Abs Immature Granulocytes: 0.02 10*3/uL (ref 0.00–0.07)
Basophils Absolute: 0.1 10*3/uL (ref 0.0–0.1)
Basophils Relative: 1 %
Eosinophils Absolute: 0 10*3/uL (ref 0.0–0.5)
Eosinophils Relative: 0 %
HCT: 44.1 % (ref 39.0–52.0)
Hemoglobin: 14.9 g/dL (ref 13.0–17.0)
Immature Granulocytes: 0 %
Lymphocytes Relative: 47 %
Lymphs Abs: 3 10*3/uL (ref 0.7–4.0)
MCH: 33.3 pg (ref 26.0–34.0)
MCHC: 33.8 g/dL (ref 30.0–36.0)
MCV: 98.7 fL (ref 80.0–100.0)
Monocytes Absolute: 0.5 10*3/uL (ref 0.1–1.0)
Monocytes Relative: 7 %
Neutro Abs: 2.9 10*3/uL (ref 1.7–7.7)
Neutrophils Relative %: 45 %
Platelets: 258 10*3/uL (ref 150–400)
RBC: 4.47 MIL/uL (ref 4.22–5.81)
RDW: 11.9 % (ref 11.5–15.5)
WBC: 6.4 10*3/uL (ref 4.0–10.5)
nRBC: 0 % (ref 0.0–0.2)

## 2023-01-07 LAB — URINALYSIS, ROUTINE W REFLEX MICROSCOPIC
Bilirubin Urine: NEGATIVE
Glucose, UA: NEGATIVE mg/dL
Hgb urine dipstick: NEGATIVE
Ketones, ur: 5 mg/dL — AB
Leukocytes,Ua: NEGATIVE
Nitrite: NEGATIVE
Protein, ur: NEGATIVE mg/dL
Specific Gravity, Urine: 1.01 (ref 1.005–1.030)
pH: 6 (ref 5.0–8.0)

## 2023-01-07 LAB — MAGNESIUM: Magnesium: 1.9 mg/dL (ref 1.7–2.4)

## 2023-01-07 LAB — ETHANOL: Alcohol, Ethyl (B): 364 mg/dL (ref <10)

## 2023-01-07 MED ORDER — SODIUM CHLORIDE 0.9 % IV SOLN
Freq: Once | INTRAVENOUS | Status: AC
  Filled 2023-01-07 (×2): qty 1000

## 2023-01-07 MED ORDER — PANTOPRAZOLE SODIUM 40 MG IV SOLR
40.0000 mg | INTRAVENOUS | Status: DC
  Administered 2023-01-07: 40 mg via INTRAVENOUS
  Filled 2023-01-07: qty 10

## 2023-01-07 MED ORDER — ENOXAPARIN SODIUM 40 MG/0.4ML IJ SOSY: 40.0000 mg | PREFILLED_SYRINGE | INTRAMUSCULAR | Status: DC

## 2023-01-07 MED ORDER — ACETAMINOPHEN 325 MG RE SUPP: 650.0000 mg | Freq: Four times a day (QID) | RECTAL | Status: DC | PRN

## 2023-01-07 MED ORDER — ONDANSETRON HCL 4 MG/2ML IJ SOLN
4.0000 mg | Freq: Once | INTRAMUSCULAR | Status: AC
  Administered 2023-01-07: 4 mg via INTRAVENOUS
  Filled 2023-01-07: qty 2

## 2023-01-07 MED ORDER — THIAMINE HCL 100 MG/ML IJ SOLN
100.0000 mg | Freq: Once | INTRAMUSCULAR | Status: AC
  Administered 2023-01-07: 100 mg via INTRAVENOUS
  Filled 2023-01-07: qty 2

## 2023-01-07 MED ORDER — ACETAMINOPHEN 325 MG PO TABS: 650.0000 mg | ORAL_TABLET | Freq: Four times a day (QID) | ORAL | Status: DC | PRN

## 2023-01-07 MED ORDER — KETOROLAC TROMETHAMINE 30 MG/ML IJ SOLN
30.0000 mg | Freq: Once | INTRAMUSCULAR | Status: AC
  Administered 2023-01-07: 30 mg via INTRAVENOUS
  Filled 2023-01-07: qty 1

## 2023-01-07 MED ORDER — LEVETIRACETAM IN NACL 1000 MG/100ML IV SOLN
1000.0000 mg | Freq: Once | INTRAVENOUS | Status: AC
  Administered 2023-01-07: 1000 mg via INTRAVENOUS
  Filled 2023-01-07: qty 100

## 2023-01-07 MED ORDER — LACTATED RINGERS IV SOLN: INTRAVENOUS | Status: DC

## 2023-01-07 MED ORDER — THIAMINE HCL 100 MG PO TABS: 100.0000 mg | ORAL_TABLET | Freq: Every day | ORAL | Status: DC

## 2023-01-07 MED ORDER — FOLIC ACID 1 MG PO TABS: 1.0000 mg | ORAL_TABLET | Freq: Every day | ORAL | Status: DC

## 2023-01-07 MED ORDER — ADULT MULTIVITAMIN W/MINERALS CH: 1.0000 | ORAL_TABLET | Freq: Every day | ORAL | Status: DC

## 2023-01-07 MED ORDER — METOCLOPRAMIDE HCL 5 MG/ML IJ SOLN
10.0000 mg | Freq: Once | INTRAMUSCULAR | Status: AC
  Administered 2023-01-07: 10 mg via INTRAVENOUS
  Filled 2023-01-07: qty 2

## 2023-01-07 MED ORDER — INFUVITE ADULT IV SOLN
Freq: Once | INTRAVENOUS | Status: DC
  Filled 2023-01-07 (×2): qty 10

## 2023-01-07 MED ORDER — FOLIC ACID 5 MG/ML IJ SOLN
1.0000 mg | Freq: Once | INTRAMUSCULAR | Status: DC
  Filled 2023-01-07: qty 0.2

## 2023-01-07 NOTE — ED Notes (Signed)
Pt scored 6 on CIWA. Per Ward MD, hold off on ativan for now and continue to monitor.

## 2023-01-07 NOTE — H&P (Addendum)
History and Physical    Patient: Adam [name] [employer]. [id number] DOB: 12-Feb-1986 DOA: 01/06/2023 DOS: the patient was seen and examined on 01/07/2023 PCP: Pcp, No  Patient coming from: Home  Chief Complaint:  Chief Complaint  Patient presents with   Seizures   HPI: Adam [name]. is a 37 y.o. male with medical history significant for alcohol abuse with symptoms of alcohol withdrawal which include seizures, visual and auditory hallucinations, history of hypertension who presents to the emergency room by private vehicle for evaluation after he had a witnessed seizure at home. Per patient he drinks about 1/5 of bourbon daily and has severe symptoms of alcohol withdrawal when he does not drink.  He states that he is trying to get clean and has not had a drink in the last 14 hours. He complains of having tremors as well as visual and auditory hallucinations.  He also complains of nausea and has been unable to tolerate any oral intake. He denies having any chest pain, no shortness of breath, no cough, no fever, no chills, no abdominal pain, no emesis, no leg swelling, no blurred vision no focal deficit. Abnormal labs include an alcohol level of 364, transaminitis, urine drug screen is positive for barbiturates and benzodiazepines He was started on lorazepam for symptoms of alcohol withdrawal and received IV fluid bolus.  Patient also received a loading dose of Keppra 1 g in the ER as well as  MVI, thiamine and folic acid. He will be admitted to the hospital for further evaluation.  Review of Systems: As mentioned in the history of present illness. All other systems reviewed and are negative. Past Medical History:  Diagnosis Date   Alcohol abuse    Eating disorder    history of bulemia   HYPERTENSION 06/25/2010   Seizure ([place])    ETOH Related Seizure   History reviewed. No pertinent surgical history. Social History:  reports that he has quit smoking. He uses smokeless  tobacco. He reports current alcohol use of about 70.0 standard drinks of alcohol per week. He reports that he does not currently use drugs.  Allergies  Allergen Reactions   Bee Venom Anaphylaxis    Family History  Problem Relation Age of Onset   Cancer Mother        lung   Hypertension Father     Prior to Admission medications   Medication Sig Start Date End Date Taking? Authorizing Provider  chlordiazePOXIDE (LIBRIUM) 25 MG capsule Take 1 tab 4 times a day for 1 day, then 1 tab 3 times a day, then take 1 tab twice daily for 1 day, then on the last day 1 tab daily for 1 day 12/16/22   Charlynne Cousins, MD  folic acid (FOLVITE) 1 MG tablet Take 1 tablet (1 mg total) by mouth daily. Patient not taking: Reported on 12/11/2022 09/13/22   Rai, Vernelle Emerald, MD  gabapentin (NEURONTIN) 300 MG capsule Take 1 capsule (300 mg total) by mouth 2 (two) times daily. Patient not taking: Reported on 12/15/2022 09/13/22 12/15/22  Rai, Vernelle Emerald, MD  lisinopril (ZESTRIL) 20 MG tablet Take 1 tablet (20 mg total) by mouth daily. 09/13/22   Rai, Vernelle Emerald, MD  thiamine (VITAMIN B1) 100 MG tablet Take 1 tablet (100 mg total) by mouth daily. Patient not taking: Reported on 12/11/2022 09/13/22   Mendel Corning, MD    Physical Exam: Vitals:   01/07/23 0429 01/07/23 0516 01/07/23 0700 01/07/23 0730  BP: 112/78 105/64  110/64 (!) 103/50  Pulse: 100 91 72 71  Resp: 17  14   Temp:      TempSrc:      SpO2: 100%  93%   Weight:      Height:       Physical Exam Vitals and nursing note reviewed.  Constitutional:      Appearance: Normal appearance.  HENT:     Head: Normocephalic.     Nose: Nose normal.     Mouth/Throat:     Mouth: Mucous membranes are moist.  Cardiovascular:     Rate and Rhythm: Normal rate and regular rhythm.  Pulmonary:     Effort: Pulmonary effort is normal.     Breath sounds: Normal breath sounds.  Abdominal:     General: Abdomen is flat. Bowel sounds are normal.      Palpations: Abdomen is soft.  Musculoskeletal:        General: Normal range of motion.     Cervical back: Normal range of motion and neck supple.  Skin:    General: Skin is warm and dry.  Neurological:     Mental Status: He is alert and oriented to person, place, and time.  Psychiatric:        Mood and Affect: Mood normal.        Behavior: Behavior normal.     Data Reviewed: Relevant notes from primary care and specialist visits, past discharge summaries as available in EHR, including Care Everywhere. Prior diagnostic testing as pertinent to current admission diagnoses Updated medications and problem lists for reconciliation ED course, including vitals, labs, imaging, treatment and response to treatment Triage notes, nursing and pharmacy notes and ED provider's notes Notable results as noted in HPI Labs reviewed.  Alcohol level 364, sodium 139, potassium 3.7, chloride 102, bicarb 23, glucose 89, BUN 17, creatinine 0.90, calcium 9.7, total protein 7.8, albumin 4.9, AST 56, ALT 45, alkaline phosphatase 50, total bilirubin 0.6, white count 6.4, hemoglobin 14.9, hematocrit 44.1, platelet count 258 Twelve-lead EKG reviewed by me sinus tachycardia There are no new results to review at this time.  Assessment and Plan: Alcohol withdrawal ([place]) Patient with a history of alcohol abuse who presents to the emergency room for evaluation of symptoms related to alcohol withdrawal which include hallucinations and tremors. Will place patient on lorazepam and administer for CIWA score of 8 or greater Continue IV fluid hydration, MVI, thiamine and folic acid  Elevated transaminase level Related to alcohol abuse Patient has been counseled on the need to abstain from alcohol use  Essential hypertension Will hold lisinopril for now since patient is normotensive      Advance Care Planning:   Code Status: Full Code   Consults: None  Family Communication: Greater than 50% of time was spent  discussing patient's condition and plan of care with him at the bedside.  All questions and concerns have been addressed.  He verbalizes understanding and agrees to the plan.  Severity of Illness: The appropriate patient status for this patient is INPATIENT. Inpatient status is judged to be reasonable and necessary in order to provide the required intensity of service to ensure the patient's safety. The patient's presenting symptoms, physical exam findings, and initial radiographic and laboratory data in the context of their chronic comorbidities is felt to place them at high risk for further clinical deterioration. Furthermore, it is not anticipated that the patient will be medically stable for discharge from the hospital within 2 midnights of admission.   *  I certify that at the point of admission it is my clinical judgment that the patient will require inpatient hospital care spanning beyond 2 midnights from the point of admission due to high intensity of service, high risk for further deterioration and high frequency of surveillance required.*  Author: Collier Bullock, MD 01/07/2023 9:14 AM  For on call review [email or web address].

## 2023-01-07 NOTE — ED Notes (Signed)
Pt sleeping at this time.

## 2023-01-07 NOTE — Assessment & Plan Note (Addendum)
Patient with a history of alcohol abuse who presented to the emergency room for evaluation of symptoms related to alcohol withdrawal which include hallucinations and tremors. Patient was placed on lorazepam and administer for CIWA score of 8 or greater Continue IV fluid hydration, MVI, thiamine and folic acid

## 2023-01-07 NOTE — Assessment & Plan Note (Signed)
Will hold lisinopril for now since patient is normotensive

## 2023-01-07 NOTE — ED Notes (Signed)
PT reports he is going to leave AMA. RN removed IV and explained that trying to detox from alcohol without medical supervision can be life threatening. He stated he understood and was aware of the risks. Pt had his belongings. He had just returned from bathroom. Breathe smelled like ETOH. PT stated he had a ride. Admitting notified.

## 2023-01-07 NOTE — ED Notes (Signed)
Pt given water.

## 2023-01-07 NOTE — Discharge Summary (Signed)
Physician Discharge Summary   Patient: Adam Barile Braniff Jr. MRN: [id number] DOB: January 13, 1986  Admit date:     01/06/2023  Discharge date: 01/07/23  Discharge Physician: Kaleigha Chamberlin   PCP: Pcp, No   Recommendations at discharge:   Patient signed out [place]  Discharge Diagnoses: Active Problems:   Alcohol withdrawal (HCC)   Elevated transaminase level   Essential hypertension  Resolved Problems:   * No resolved hospital problems. *  Hospital Course:  Adam [name] Adam Bonito. is a 37 y.o. male with medical history significant for alcohol abuse with symptoms of alcohol withdrawal which include seizures, visual and auditory hallucinations, history of hypertension who presented to the emergency room by private vehicle for evaluation after he had a witnessed seizure at home. Per patient he drinks about 1/5 of bourbon daily and has severe symptoms of alcohol withdrawal when he does not drink.  He states that he is trying to get clean and has not had a drink in the last 14 hours. He complained of having tremors as well as visual and auditory hallucinations.  He also complained of nausea and has been unable to tolerate any oral intake. He denied having any chest pain, no shortness of breath, no cough, no fever, no chills, no abdominal pain, no emesis, no leg swelling, no blurred vision no focal deficit. Abnormal labs included an alcohol level of 364, transaminitis, urine drug screen is positive for barbiturates and benzodiazepines He was started on lorazepam for symptoms of alcohol withdrawal and received IV fluid bolus.  Patient also received a loading dose of Keppra 1 g in the ER as well as  MVI, thiamine and folic acid. He will be admitted to the hospital for further evaluation. Assessment and Plan: Alcohol withdrawal ([place]) Patient with a history of alcohol abuse who presented to the emergency room for evaluation of symptoms related to alcohol withdrawal which include  hallucinations and tremors. Patient was placed on lorazepam and administer for CIWA score of 8 or greater Continue IV fluid hydration, MVI, thiamine and folic acid  Elevated transaminase level Related to alcohol abuse Patient has been counseled on the need to abstain from alcohol use  Essential hypertension Will hold lisinopril for now since patient is normotensive         Consultants: None Procedures performed: None Disposition: Home Diet recommendation:  Cardiac diet DISCHARGE MEDICATION:   Discharge Exam:  Vitals and nursing note reviewed.  Constitutional:      Appearance: Normal appearance.  HENT:     Head: Normocephalic.     Nose: Nose normal.     Mouth/Throat:     Mouth: Mucous membranes are moist.  Cardiovascular:     Rate and Rhythm: Normal rate and regular rhythm.  Pulmonary:     Effort: Pulmonary effort is normal.     Breath sounds: Normal breath sounds.  Abdominal:     General: Abdomen is flat. Bowel sounds are normal.     Palpations: Abdomen is soft.  Musculoskeletal:        General: Normal range of motion.     Cervical back: Normal range of motion and neck supple.  Skin:    General: Skin is warm and dry.  Neurological:     Mental Status: He is alert and oriented to person, place, and time.  Psychiatric:        Mood and Affect: Mood normal.        Behavior: Behavior normal.    Filed Weights  01/06/23 2324  Weight: 78 kg     Condition at discharge:  Signed out [place]  The results of significant diagnostics from this hospitalization (including imaging, microbiology, ancillary and laboratory) are listed below for reference.   Imaging Studies: CT HEAD WO CONTRAST (5MM)  Result Date: 12/15/2022 CLINICAL DATA:  37 year old male with altered mental status. ETOH withdrawal. Seizure and hallucinations. EXAM: CT HEAD WITHOUT CONTRAST TECHNIQUE: Contiguous axial images were obtained from the base of the skull through the vertex  without intravenous contrast. RADIATION DOSE REDUCTION: This exam was performed according to the departmental dose-optimization program which includes automated exposure control, adjustment of the mA and/or kV according to patient size and/or use of iterative reconstruction technique. COMPARISON:  Brain MRI 04/25/2022.  Head CT 12/10/2022. FINDINGS: Brain: Age advanced cerebral volume loss appears to be chronic and not significantly changed from last year. This appears generalized. No midline shift, ventriculomegaly, mass effect, evidence of mass lesion, intracranial hemorrhage or evidence of cortically based acute infarction. Gray-white matter differentiation is within normal limits throughout the brain. No discrete encephalomalacia identified. Vascular: No suspicious intracranial vascular hyperdensity. Skull: Scaphocephaly.  No acute osseous abnormality identified. Sinuses/Orbits: Visualized paranasal sinuses and mastoids are stable and well aerated. Other: Visualized orbits and scalp soft tissues are within normal limits. IMPRESSION: No acute intracranial abnormality.  Cerebral Atrophy (ICD10-G31.9). Electronically Signed   By: Adam [name] M.D.   On: 12/15/2022 06:36   CT HEAD WO CONTRAST (5MM)  Result Date: 12/10/2022 CLINICAL DATA:  Head trauma, focal neuro findings.  Seizures. EXAM: CT HEAD WITHOUT CONTRAST TECHNIQUE: Contiguous axial images were obtained from the base of the skull through the vertex without intravenous contrast. RADIATION DOSE REDUCTION: This exam was performed according to the departmental dose-optimization program which includes automated exposure control, adjustment of the mA and/or kV according to patient size and/or use of iterative reconstruction technique. COMPARISON:  Head CT 09/12/2022. FINDINGS: Brain: No acute hemorrhage, mass effect or midline shift. Gray-white differentiation is preserved. No hydrocephalus. No extra-axial collection. Basilar cisterns are patent. Vascular: No  hyperdense vessel or unexpected calcification. Skull: No calvarial fracture or suspicious bone lesion. Skull base is unremarkable. Sinuses/Orbits: Paranasal sinuses, mastoid air cells, and middle ear cavities are well aerated. Orbits are unremarkable. Other: None. IMPRESSION: No acute intracranial abnormality. Electronically Signed   By: Emmit Alexanders M.D.   On: 12/10/2022 15:52    Microbiology: Results for orders placed or performed during the hospital encounter of 12/10/22  Resp panel by RT-PCR (RSV, Flu A&B, Covid) Urine, Clean Catch     Status: None   Collection Time: 12/10/22  2:38 PM   Specimen: Urine, Clean Catch; Nasal Swab  Result Value Ref Range Status   SARS Coronavirus 2 by RT PCR NEGATIVE NEGATIVE Final    Comment: (NOTE) SARS-CoV-2 target nucleic acids are NOT DETECTED.  The SARS-CoV-2 RNA is generally detectable in upper respiratory specimens during the acute phase of infection. The lowest concentration of SARS-CoV-2 viral copies this assay can detect is 138 copies/mL. A negative result does not preclude SARS-Cov-2 infection and should not be used as the sole basis for treatment or other patient management decisions. A negative result may occur with  improper specimen collection/handling, submission of specimen other than nasopharyngeal swab, presence of viral mutation(s) within the areas targeted by this assay, and inadequate number of viral copies(<138 copies/mL). A negative result must be combined with clinical observations, patient history, and epidemiological information. The expected result is Negative.  Fact Sheet  for Patients:  EntrepreneurPulse.com.au  Fact Sheet for Healthcare Providers:  IncredibleEmployment.be  This test is no t yet approved or cleared by the [place] FDA and  has been authorized for detection and/or diagnosis of SARS-CoV-2 by FDA under an Emergency Use Authorization (EUA). This EUA will remain  in  effect (meaning this test can be used) for the duration of the COVID-19 declaration under Section 564(b)(1) of the Act, 21 U.S.C.section 360bbb-3(b)(1), unless the authorization is terminated  or revoked sooner.       Influenza A by PCR NEGATIVE NEGATIVE Final   Influenza B by PCR NEGATIVE NEGATIVE Final    Comment: (NOTE) The Xpert Xpress SARS-CoV-2/FLU/RSV plus assay is intended as an aid in the diagnosis of influenza from Nasopharyngeal swab specimens and should not be used as a sole basis for treatment. Nasal washings and aspirates are unacceptable for Xpert Xpress SARS-CoV-2/FLU/RSV testing.  Fact Sheet for Patients: EntrepreneurPulse.com.au  Fact Sheet for Healthcare Providers: IncredibleEmployment.be  This test is not yet approved or cleared by the [place] FDA and has been authorized for detection and/or diagnosis of SARS-CoV-2 by FDA under an Emergency Use Authorization (EUA). This EUA will remain in effect (meaning this test can be used) for the duration of the COVID-19 declaration under Section 564(b)(1) of the Act, 21 U.S.C. section 360bbb-3(b)(1), unless the authorization is terminated or revoked.     Resp Syncytial Virus by PCR NEGATIVE NEGATIVE Final    Comment: (NOTE) Fact Sheet for Patients: EntrepreneurPulse.com.au  Fact Sheet for Healthcare Providers: IncredibleEmployment.be  This test is not yet approved or cleared by the [place] FDA and has been authorized for detection and/or diagnosis of SARS-CoV-2 by FDA under an Emergency Use Authorization (EUA). This EUA will remain in effect (meaning this test can be used) for the duration of the COVID-19 declaration under Section 564(b)(1) of the Act, 21 U.S.C. section 360bbb-3(b)(1), unless the authorization is terminated or revoked.  Performed at [facility], [place] [address] [place], [place] [address]    MRSA Next Gen by PCR, Nasal     Status: None   Collection Time: 12/10/22  4:49 PM   Specimen: Nasal Mucosa; Nasal Swab  Result Value Ref Range Status   MRSA by PCR Next Gen NOT DETECTED NOT DETECTED Final    Comment: (NOTE) The GeneXpert MRSA Assay (FDA approved for NASAL specimens only), is one component of a comprehensive MRSA colonization surveillance program. It is not intended to diagnose MRSA infection nor to guide or monitor treatment for MRSA infections. Test performance is not FDA approved in patients less than 46 years old. Performed at [facility], [place] [address] [place], [place] [address]     Labs: CBC: Recent Labs  Lab 01/06/23 2345  WBC 6.4  NEUTROABS 2.9  HGB 14.9  HCT 44.1  MCV 98.7  PLT 0000000   Basic Metabolic Panel: Recent Labs  Lab 01/06/23 2345  NA 139  K 3.7  CL 102  CO2 23  GLUCOSE 89  BUN 17  CREATININE 0.90  CALCIUM 9.7  MG 1.9   Liver Function Tests: Recent Labs  Lab 01/06/23 2345  AST 56*  ALT 45*  ALKPHOS 50  BILITOT 0.6  PROT 7.8  ALBUMIN 4.9   CBG: No results for input(s): "GLUCAP" in the last 168 hours.  Discharge time spent: less than 30 minutes.  Signed: Collier Bullock, MD [facility] 01/07/2023

## 2023-01-07 NOTE — Assessment & Plan Note (Signed)
Related to alcohol abuse Patient has been counseled on the need to abstain from alcohol use

## 2023-01-14 ENCOUNTER — Other Ambulatory Visit (HOSPITAL_COMMUNITY): Payer: Self-pay

## 2023-01-14 MED ORDER — THIAMINE HCL 100 MG PO TABS: ORAL_TABLET | ORAL | 0 refills | Status: DC

## 2023-01-14 MED ORDER — LEVETIRACETAM 500 MG PO TABS
ORAL_TABLET | ORAL | 0 refills | Status: DC
  Filled 2023-02-21: qty 60, 30d supply, fill #0

## 2023-01-14 MED ORDER — FOLIC ACID 1 MG PO TABS
ORAL_TABLET | ORAL | 0 refills | Status: DC
  Filled 2023-02-21: qty 30, 30d supply, fill #0

## 2023-02-21 ENCOUNTER — Other Ambulatory Visit (HOSPITAL_COMMUNITY): Payer: Self-pay

## 2023-02-21 ENCOUNTER — Other Ambulatory Visit: Payer: Self-pay

## 2023-03-03 ENCOUNTER — Other Ambulatory Visit (HOSPITAL_COMMUNITY): Payer: Self-pay

## 2023-03-04 ENCOUNTER — Other Ambulatory Visit: Payer: Self-pay

## 2023-03-04 ENCOUNTER — Inpatient Hospital Stay (HOSPITAL_COMMUNITY)
Admission: EM | Admit: 2023-03-04 | Discharge: 2023-03-07 | DRG: 897 | Disposition: A | Discharge status: Home / Self Care | Payer: Medicaid Other | Attending: Family Medicine | Admitting: Family Medicine

## 2023-03-04 ENCOUNTER — Encounter (HOSPITAL_COMMUNITY): Payer: Self-pay

## 2023-03-04 ENCOUNTER — Emergency Department (HOSPITAL_COMMUNITY): Payer: Medicaid Other

## 2023-03-04 DIAGNOSIS — I10 Essential (primary) hypertension: Secondary | ICD-10-CM | POA: Diagnosis present

## 2023-03-04 DIAGNOSIS — N179 Acute kidney failure, unspecified: Secondary | ICD-10-CM | POA: Diagnosis present

## 2023-03-04 DIAGNOSIS — H9319 Tinnitus, unspecified ear: Secondary | ICD-10-CM | POA: Diagnosis present

## 2023-03-04 DIAGNOSIS — Y907 Blood alcohol level of 200-239 mg/100 ml: Secondary | ICD-10-CM | POA: Diagnosis present

## 2023-03-04 DIAGNOSIS — F1090 Alcohol use, unspecified, uncomplicated: Secondary | ICD-10-CM

## 2023-03-04 DIAGNOSIS — R7401 Elevation of levels of liver transaminase levels: Secondary | ICD-10-CM | POA: Diagnosis present

## 2023-03-04 DIAGNOSIS — F10221 Alcohol dependence with intoxication delirium: Secondary | ICD-10-CM | POA: Diagnosis present

## 2023-03-04 DIAGNOSIS — F10932 Alcohol use, unspecified with withdrawal with perceptual disturbance: Secondary | ICD-10-CM

## 2023-03-04 DIAGNOSIS — I959 Hypotension, unspecified: Secondary | ICD-10-CM | POA: Diagnosis present

## 2023-03-04 DIAGNOSIS — F419 Anxiety disorder, unspecified: Secondary | ICD-10-CM | POA: Diagnosis present

## 2023-03-04 DIAGNOSIS — F10232 Alcohol dependence with withdrawal with perceptual disturbance: Principal | ICD-10-CM | POA: Diagnosis present

## 2023-03-04 DIAGNOSIS — Z79899 Other long term (current) drug therapy: Secondary | ICD-10-CM

## 2023-03-04 DIAGNOSIS — F10939 Alcohol use, unspecified with withdrawal, unspecified: Secondary | ICD-10-CM | POA: Diagnosis present

## 2023-03-04 DIAGNOSIS — R61 Generalized hyperhidrosis: Secondary | ICD-10-CM | POA: Diagnosis present

## 2023-03-04 DIAGNOSIS — E876 Hypokalemia: Secondary | ICD-10-CM | POA: Diagnosis present

## 2023-03-04 DIAGNOSIS — G40509 Epileptic seizures related to external causes, not intractable, without status epilepticus: Secondary | ICD-10-CM | POA: Diagnosis present

## 2023-03-04 LAB — CBC
HCT: 40.8 % (ref 39.0–52.0)
Hemoglobin: 14.3 g/dL (ref 13.0–17.0)
MCH: 33.4 pg (ref 26.0–34.0)
MCHC: 35 g/dL (ref 30.0–36.0)
MCV: 95.3 fL (ref 80.0–100.0)
Platelets: 196 10*3/uL (ref 150–400)
RBC: 4.28 MIL/uL (ref 4.22–5.81)
RDW: 11.3 % — ABNORMAL LOW (ref 11.5–15.5)
WBC: 4.6 10*3/uL (ref 4.0–10.5)
nRBC: 0 % (ref 0.0–0.2)

## 2023-03-04 LAB — COMPREHENSIVE METABOLIC PANEL
ALT: 199 U/L — ABNORMAL HIGH (ref 0–44)
AST: 209 U/L — ABNORMAL HIGH (ref 15–41)
Albumin: 3.9 g/dL (ref 3.5–5.0)
Alkaline Phosphatase: 57 U/L (ref 38–126)
Anion gap: 13 (ref 5–15)
BUN: 14 mg/dL (ref 6–20)
CO2: 26 mmol/L (ref 22–32)
Calcium: 8.8 mg/dL — ABNORMAL LOW (ref 8.9–10.3)
Chloride: 97 mmol/L — ABNORMAL LOW (ref 98–111)
Creatinine, Ser: 1.38 mg/dL — ABNORMAL HIGH (ref 0.61–1.24)
GFR, Estimated: >60 mL/min (ref >60)
Glucose, Bld: 89 mg/dL (ref 70–99)
Potassium: 3.4 mmol/L — ABNORMAL LOW (ref 3.5–5.1)
Sodium: 136 mmol/L (ref 135–145)
Total Bilirubin: 0.6 mg/dL (ref 0.3–1.2)
Total Protein: 6.3 g/dL — ABNORMAL LOW (ref 6.5–8.1)

## 2023-03-04 LAB — RAPID URINE DRUG SCREEN, HOSP PERFORMED
Amphetamines: NOT DETECTED
Barbiturates: POSITIVE — AB
Benzodiazepines: POSITIVE — AB
Cocaine: NOT DETECTED
Opiates: NOT DETECTED
Tetrahydrocannabinol: NOT DETECTED

## 2023-03-04 LAB — MAGNESIUM: Magnesium: 2.1 mg/dL (ref 1.7–2.4)

## 2023-03-04 LAB — ETHANOL: Alcohol, Ethyl (B): 215 mg/dL — ABNORMAL HIGH (ref <10)

## 2023-03-04 LAB — PHOSPHORUS: Phosphorus: 3.1 mg/dL (ref 2.5–4.6)

## 2023-03-04 LAB — TROPONIN I (HIGH SENSITIVITY)
Troponin I (High Sensitivity): 3 ng/L (ref <18)
Troponin I (High Sensitivity): 2 ng/L (ref <18)

## 2023-03-04 LAB — MRSA NEXT GEN BY PCR, NASAL: MRSA by PCR Next Gen: NOT DETECTED

## 2023-03-04 MED ORDER — THIAMINE HCL 100 MG/ML IJ SOLN
100.0000 mg | Freq: Every day | INTRAMUSCULAR | Status: DC
  Administered 2023-03-04: 100 mg via INTRAVENOUS
  Filled 2023-03-04: qty 2

## 2023-03-04 MED ORDER — LORAZEPAM 2 MG/ML IJ SOLN
1.0000 mg | INTRAMUSCULAR | Status: AC | PRN
  Administered 2023-03-04: 1 mg via INTRAVENOUS
  Administered 2023-03-04: 2 mg via INTRAVENOUS
  Administered 2023-03-04 – 2023-03-06 (×2): 3 mg via INTRAVENOUS
  Filled 2023-03-04 (×2): qty 1
  Filled 2023-03-04 (×2): qty 2

## 2023-03-04 MED ORDER — LORAZEPAM 1 MG PO TABS
1.0000 mg | ORAL_TABLET | ORAL | Status: DC
  Administered 2023-03-04 – 2023-03-05 (×5): 1 mg via ORAL
  Filled 2023-03-04 (×5): qty 1

## 2023-03-04 MED ORDER — ADULT MULTIVITAMIN W/MINERALS CH
1.0000 | ORAL_TABLET | Freq: Every day | ORAL | Status: DC
  Administered 2023-03-04 – 2023-03-07 (×4): 1 via ORAL
  Filled 2023-03-04 (×4): qty 1

## 2023-03-04 MED ORDER — LORAZEPAM 2 MG/ML IJ SOLN
2.0000 mg | Freq: Once | INTRAMUSCULAR | Status: AC
  Administered 2023-03-04: 2 mg via INTRAVENOUS

## 2023-03-04 MED ORDER — LORAZEPAM 2 MG/ML IJ SOLN
1.0000 mg | INTRAMUSCULAR | Status: DC
  Administered 2023-03-04: 1 mg via INTRAVENOUS
  Filled 2023-03-04: qty 1

## 2023-03-04 MED ORDER — ORAL CARE MOUTH RINSE: 15.0000 mL | OROMUCOSAL | Status: DC | PRN

## 2023-03-04 MED ORDER — ACETAMINOPHEN 325 MG PO TABS
650.0000 mg | ORAL_TABLET | Freq: Four times a day (QID) | ORAL | Status: DC | PRN
  Administered 2023-03-04 – 2023-03-07 (×4): 650 mg via ORAL
  Filled 2023-03-04 (×4): qty 2

## 2023-03-04 MED ORDER — CHLORHEXIDINE GLUCONATE CLOTH 2 % EX PADS
6.0000 | MEDICATED_PAD | Freq: Every day | CUTANEOUS | Status: DC
  Administered 2023-03-04 – 2023-03-05 (×2): 6 via TOPICAL

## 2023-03-04 MED ORDER — SODIUM CHLORIDE 0.9 % IV SOLN: INTRAVENOUS | Status: AC

## 2023-03-04 MED ORDER — ENOXAPARIN SODIUM 40 MG/0.4ML IJ SOSY
40.0000 mg | PREFILLED_SYRINGE | Freq: Every day | INTRAMUSCULAR | Status: DC
  Administered 2023-03-04: 40 mg via SUBCUTANEOUS
  Filled 2023-03-04 (×4): qty 0.4

## 2023-03-04 MED ORDER — FOLIC ACID 1 MG PO TABS
1.0000 mg | ORAL_TABLET | Freq: Every day | ORAL | Status: DC
  Administered 2023-03-04 – 2023-03-07 (×4): 1 mg via ORAL
  Filled 2023-03-04 (×4): qty 1

## 2023-03-04 MED ORDER — ONDANSETRON HCL 4 MG/2ML IJ SOLN
4.0000 mg | Freq: Once | INTRAMUSCULAR | Status: AC
  Administered 2023-03-04: 4 mg via INTRAVENOUS
  Filled 2023-03-04: qty 2

## 2023-03-04 MED ORDER — LORAZEPAM 1 MG PO TABS: 1.0000 mg | ORAL_TABLET | ORAL | Status: DC

## 2023-03-04 MED ORDER — THIAMINE MONONITRATE 100 MG PO TABS
100.0000 mg | ORAL_TABLET | Freq: Every day | ORAL | Status: DC
  Administered 2023-03-05 – 2023-03-07 (×3): 100 mg via ORAL
  Filled 2023-03-04 (×3): qty 1

## 2023-03-04 MED ORDER — FAMOTIDINE IN NACL 20-0.9 MG/50ML-% IV SOLN
20.0000 mg | Freq: Once | INTRAVENOUS | Status: AC
  Administered 2023-03-04: 20 mg via INTRAVENOUS
  Filled 2023-03-04: qty 50

## 2023-03-04 MED ORDER — POTASSIUM CHLORIDE CRYS ER 20 MEQ PO TBCR
40.0000 meq | EXTENDED_RELEASE_TABLET | Freq: Once | ORAL | Status: AC
  Administered 2023-03-04: 40 meq via ORAL
  Filled 2023-03-04: qty 2

## 2023-03-04 MED ORDER — LORAZEPAM 2 MG/ML IJ SOLN: 1.0000 mg | INTRAMUSCULAR | Status: DC

## 2023-03-04 MED ORDER — LACTATED RINGERS IV BOLUS
1000.0000 mL | Freq: Once | INTRAVENOUS | Status: AC
  Administered 2023-03-04: 1000 mL via INTRAVENOUS

## 2023-03-04 MED ORDER — LORAZEPAM 1 MG PO TABS
1.0000 mg | ORAL_TABLET | ORAL | Status: AC | PRN
  Administered 2023-03-04: 3 mg via ORAL
  Administered 2023-03-05 – 2023-03-06 (×4): 1 mg via ORAL
  Administered 2023-03-06 (×2): 2 mg via ORAL
  Filled 2023-03-04 (×2): qty 1
  Filled 2023-03-04: qty 2
  Filled 2023-03-04: qty 3
  Filled 2023-03-04: qty 2
  Filled 2023-03-04 (×2): qty 1

## 2023-03-04 NOTE — Evaluation (Signed)
Clinical/Bedside Swallow Evaluation Patient Details  Name: Adam [name] [name]. MRN: [id number] Date of Birth: 11-24-1985  Today's Date: 03/04/2023 Time: SLP Start Time (ACUTE ONLY): 1257 SLP Stop Time (ACUTE ONLY): 1307 SLP Time Calculation (min) (ACUTE ONLY): 10 min  Past Medical History:  Past Medical History:  Diagnosis Date   Alcohol abuse    Eating disorder    history of bulemia   HYPERTENSION 06/25/2010   Seizure    ETOH Related Seizure   Past Surgical History: History reviewed. No pertinent surgical history. HPI:  Adam [name]. is a 37 y.o. male presenting with alcohol withdrawal. PMH significant for alcohol use disorder, HTN    Assessment / Plan / Recommendation  Clinical Impression  Pt is slightly drowsy but adequately awake for swallow evaluation. Dentition and oromotor exam are intact and pt with strong volitional cough. Pt presents with normal oropharyngeal swallow ability across textures. Respiratory and swallow coordination with consecutive straw sips thin appeared timely and efficient without s/s aspiration. He masticated solid texture adequately without residue across several trials. He denies GER or symptoms or heartburn. Recommend initiate regular texture, thin liquids, pills with thin liqiuds in upright position. No further ST is needed at this time. SLP Visit Diagnosis: Dysphagia, unspecified (R13.10)    Aspiration Risk  No limitations    Diet Recommendation Regular;Thin liquid   Liquid Administration via: Straw Medication Administration: Whole meds with liquid Supervision: Patient able to self feed Postural Changes: Seated upright at 90 degrees    Other  Recommendations Oral Care Recommendations: Oral care BID    Recommendations for follow up therapy are one component of a multi-disciplinary discharge planning process, led by the attending physician.  Recommendations may be updated based on patient status, additional functional criteria and  insurance authorization.  Follow up Recommendations No SLP follow up      Assistance Recommended at Discharge    Functional Status Assessment Patient has not had a recent decline in their functional status  Frequency and Duration            Prognosis        Swallow Study   General Date of Onset: 03/04/23 HPI: Joandry Wolz Sick Montez [name]. is a 37 y.o. male presenting with alcohol withdrawal. PMH significant for alcohol use disorder, HTN Type of Study: Bedside Swallow Evaluation Previous Swallow Assessment:  (none) Diet Prior to this Study: NPO Temperature Spikes Noted: No Respiratory Status: Room air History of Recent Intubation: No Behavior/Cognition: Cooperative;Pleasant mood (adequately awake-slightly drowsy) Oral Cavity Assessment: Within Functional Limits Oral Care Completed by SLP: No Oral Cavity - Dentition: Adequate natural dentition Vision: Functional for self-feeding Self-Feeding Abilities: Able to feed self Patient Positioning: Upright in bed Baseline Vocal Quality: Normal Volitional Cough: Strong Volitional Swallow: Able to elicit    Oral/Motor/Sensory Function Overall Oral Motor/Sensory Function: Within functional limits   Ice Chips Ice chips: Not tested   Thin Liquid Thin Liquid: Within functional limits Presentation: Straw    Nectar Thick Nectar Thick Liquid: Not tested   Honey Thick Honey Thick Liquid: Not tested   Puree Puree: Within functional limits   Solid     Solid: Within functional limits      Royce Macadamia 03/04/2023,1:33 PM

## 2023-03-04 NOTE — Assessment & Plan Note (Addendum)
Last drink 4/11 ~3pm. LFTs were trending down, and now increased. Off scheduled ativan. Last PRN ativan 1mg  yesterday @ 4PM. CIWA 3>4>1. - continue Ativan 1 mg q1h prn - CIWAs q 4h  - continue MVI, thiamine, and folic acid  - Start Acamprosate at d/c (naltrexone contraindicated at this time given elevated LFTs) - Encourage AA support meetings outpt

## 2023-03-04 NOTE — ED Provider Notes (Signed)
[place] EMERGENCY DEPARTMENT AT [facility] Provider Note   CSN: [id number] Arrival date & time: 03/04/23  4098     History  Chief Complaint  Patient presents with   ETOH Withdrawal   Seizures   Chest Pain    Adam Peoples Calvey Montez Hageman. is a 37 y.o. male.  Patient is a 37 year old male with a past medical history of hypertension and alcohol use disorder presenting to the emergency department with alcohol withdrawal.  Patient states that he drinks about 1.5 fifths of alcohol per day.  He states that his last drink was yesterday.  He states that his wife told him this morning that he had a seizure.  He states that he had no tongue bite or urinary incontinence and that she did not describe what she saw but recommend that he come to the emergency department.  He states that last night he started to develop visual hallucinations saying "smoke in the room".  He states that he is been nauseous and intermittently vomiting.  He reports mild abdominal cramping.  He states that he had 1 episode of diarrhea yesterday.  He denies any fevers or chills.  He states that he has been in and out of the hospital and ER for treatment for his alcohol withdrawal but is trying to completely quit alcohol.  States that he has had alcohol withdrawal seizures in the past.  He additionally reported chest pain to triage however not report this to myself.  The history is provided by the patient.  Seizures Chest Pain      Home Medications Prior to Admission medications   Medication Sig Start Date End Date Taking? Authorizing Provider  chlordiazePOXIDE (LIBRIUM) 25 MG capsule Take 1 tab 4 times a day for 1 day, then 1 tab 3 times a day, then take 1 tab twice daily for 1 day, then on the last day 1 tab daily for 1 day Patient not taking: Reported on 01/07/2023 12/16/22   Marinda Elk, MD  folic acid (FOLVITE) 1 MG tablet Take 1 tablet (1 mg total) by mouth daily. Patient not taking: Reported on  12/11/2022 09/13/22   Cathren Harsh, MD  folic acid (FOLVITE) 1 MG tablet Take 1 tablet (1 mg total) by mouth once daily for 30 days 01/13/23     gabapentin (NEURONTIN) 300 MG capsule Take 1 capsule (300 mg total) by mouth 2 (two) times daily. Patient not taking: Reported on 12/15/2022 09/13/22 12/15/22  Cathren Harsh, MD  levETIRAcetam (KEPPRA) 500 MG tablet Take 1 tablet (500 mg total) by mouth every 12 (twelve) hours 01/13/23     lisinopril (ZESTRIL) 20 MG tablet Take 1 tablet (20 mg total) by mouth daily. 09/13/22   Rai, Delene Ruffini, MD  thiamine (VITAMIN B1) 100 MG tablet Take 1 tablet (100 mg total) by mouth daily. Patient not taking: Reported on 12/11/2022 09/13/22   Cathren Harsh, MD  thiamine (VITAMIN B1) 100 MG tablet Take 1 tablet (100 mg total) by mouth once daily 01/13/23         Allergies    Bee venom    Review of Systems   Review of Systems  Cardiovascular:  Positive for chest pain.  Neurological:  Positive for seizures.    Physical Exam Updated Vital Signs BP 131/77   Pulse 93   Temp 97.7 F (36.5 C) (Oral)   Resp 16   Ht  (1.702 m)   Wt 77.1 kg   SpO2 97%  BMI 26.63 kg/m  Physical Exam Vitals and nursing note reviewed.  Constitutional:      General: He is not in acute distress.    Appearance: He is well-developed. He is diaphoretic (Mildly sweaty forehead and palms).  HENT:     Head: Normocephalic and atraumatic.     Comments: No evidence of tongue bite and oropharynx Cerumen impaction bilaterally Eyes:     Extraocular Movements: Extraocular movements intact.     Pupils: Pupils are equal, round, and reactive to light.  Cardiovascular:     Rate and Rhythm: Normal rate and regular rhythm.     Heart sounds: Normal heart sounds.  Pulmonary:     Effort: Pulmonary effort is normal.     Breath sounds: Normal breath sounds.  Abdominal:     Palpations: Abdomen is soft.     Tenderness: There is no abdominal tenderness.  Musculoskeletal:        General:  Normal range of motion.     Cervical back: Normal range of motion and neck supple.     Right lower leg: No edema.     Left lower leg: No edema.     Comments: Mild tremors with arms extended  Skin:    General: Skin is warm.  Neurological:     General: No focal deficit present.     Mental Status: He is alert and oriented to person, place, and time.     Cranial Nerves: No cranial nerve deficit.     Motor: No weakness.  Psychiatric:        Mood and Affect: Mood normal.        Behavior: Behavior normal.     ED Results / Procedures / Treatments   Labs (all labs ordered are listed, but only abnormal results are displayed) Labs Reviewed  COMPREHENSIVE METABOLIC PANEL - Abnormal; Notable for the following components:      Result Value   Potassium 3.4 (*)    Chloride 97 (*)    Creatinine, Ser 1.38 (*)    Calcium 8.8 (*)    Total Protein 6.3 (*)    AST 209 (*)    ALT 199 (*)    All other components within normal limits  ETHANOL - Abnormal; Notable for the following components:   Alcohol, Ethyl (B) 215 (*)    All other components within normal limits  CBC - Abnormal; Notable for the following components:   RDW 11.3 (*)    All other components within normal limits  MAGNESIUM  PHOSPHORUS  RAPID URINE DRUG SCREEN, HOSP PERFORMED  TROPONIN I (HIGH SENSITIVITY)  TROPONIN I (HIGH SENSITIVITY)    EKG EKG Interpretation  Date/Time:  Friday March 04 2023 08:21:19 EDT Ventricular Rate:  74 PR Interval:  167 QRS Duration: 102 QT Interval:  406 QTC Calculation: 451 R Axis:   57 Text Interpretation: Sinus rhythm Anteroseptal infarct, age indeterminate No significant change since last tracing Confirmed by Elayne Snare ([id number]) on 03/04/2023 8:24:52 AM  Radiology DG Chest 2 View  Result Date: 03/04/2023 CLINICAL DATA:  Chest pain EXAM: CHEST - 2 VIEW COMPARISON:  Chest radiograph dated 09/12/2022 FINDINGS: Normal lung volumes. No focal consolidations. No pleural effusion or  pneumothorax. The heart size and mediastinal contours are within normal limits. The visualized skeletal structures are unremarkable. IMPRESSION: No active cardiopulmonary disease. Electronically Signed   By: Agustin Cree M.D.   On: 03/04/2023 09:06    Procedures Procedures    Medications Ordered in ED Medications  LORazepam (ATIVAN)  tablet 1-4 mg ( Oral See Alternative 03/04/23 0913)    Or  LORazepam (ATIVAN) injection 1-4 mg (2 mg Intravenous Given 03/04/23 0913)  thiamine (VITAMIN B1) tablet 100 mg ( Oral See Alternative 03/04/23 1015)    Or  thiamine (VITAMIN B1) injection 100 mg (100 mg Intravenous Given 03/04/23 1015)  folic acid (FOLVITE) tablet 1 mg (has no administration in time range)  multivitamin with minerals tablet 1 tablet (has no administration in time range)  lactated ringers bolus 1,000 mL (has no administration in time range)  potassium chloride SA (KLOR-CON M) CR tablet 40 mEq (has no administration in time range)  ondansetron (ZOFRAN) injection 4 mg (4 mg Intravenous Given 03/04/23 0911)  famotidine (PEPCID) IVPB 20 mg premix (0 mg Intravenous Stopped 03/04/23 0948)    ED Course/ Medical Decision Making/ A&P Clinical Course as of 03/04/23 1123  Fri Mar 04, 2023  0910 I was called to bedside that the patient was having a seizure. He was rolled to his right side having tonic clonic activity and tachycardic to 120s. 2mg  Ativan was drawn and seizure stopped prior to ativan given. He was given the 2 mg to prevent recurrent seizures and manage withdrawal symptoms. He immediately woke up stating "I'm sorry" and complaining of nausea and headache. After seizure activity stopped he was hemodynamically stable with HR in 90s and satting 96% on RA. [VK]  1114 Mild AKI and will be given IVF as well as potassium repletion. Transaminitis slightly increased compared to prior labs. Likely due to ETOH use. He has no RUQ tenderness making cholelithiasis or cholecystitis unlikely. [VK]  1121 On  reassessment, the patient is asleep in bed resting comfortably.  Blood pressures are soft in the 90s systolic and he was started on a liter of IV fluids.  He is asymptomatic at this time without symptoms of withdrawal and will continue to be closely monitored.  The patient will be admitted for his alcohol withdrawal with seizures. [VK]    Clinical Course User Index [VK] Rexford Maus, DO                             Medical Decision Making This patient presents to the ED with chief complaint(s) of alcohol withdrawal with pertinent past medical history of alcohol use disorder, hypertension which further complicates the presenting complaint. The complaint involves an extensive differential diagnosis and also carries with it a high risk of complications and morbidity.    The differential diagnosis includes alcohol withdrawal alcohol withdrawal seizure, electrolyte abnormality, patient has no signs of head trauma no focal neurologic deficits making ICH or mass effect unlikely, considering alcohol gastritis, ACS, arrhythmia, anemia  Additional history obtained: Additional history obtained from N/A Records reviewed previous admission documents and Care Everywhere/External Records  ED Course and Reassessment: On patient's arrival he is well-appearing in no acute distress.  He is mildly diaphoretic and tremulous, reports nausea and hallucinations concerning for alcohol withdrawal.  He was placed on the CIWA protocol and seizure precautions.  The patient will have labs performed to evaluate for alternative causes of his seizure as well as for his chest pain and nausea.  He was given Zofran and Pepcid for symptomatic management.  EKG on arrival showed normal sinus rhythm.  Patient will likely require admission for his alcohol withdrawal seizure and is agreeable with the plans. CIWA on my initial evaluation is 10.  Independent labs interpretation:  The following labs were  independently interpreted:  mild AKI and hypokalemia, slight increase of transaminitis compared to prior labs  Independent visualization of imaging: - I independently visualized the following imaging with scope of interpretation limited to determining acute life threatening conditions related to emergency care: CXR, which revealed no acute disease  Consultation: - Consulted or discussed management/test interpretation w/ external professional: hospitalist  Consideration for admission or further workup: patient requires admission for his alcohol withdrawal with seizures Social Determinants of health: ETOH use disorder    Amount and/or Complexity of Data Reviewed Labs: ordered. Radiology: ordered.  Risk OTC drugs. Prescription drug management.          Final Clinical Impression(s) / ED Diagnoses Final diagnoses:  Alcohol withdrawal seizure with perceptual disturbance  Transaminitis    Rx / DC Orders ED Discharge Orders     None         Rexford Maus, DO 03/04/23 1123

## 2023-03-04 NOTE — Progress Notes (Signed)
Substance abuse resources have been added to patients AVS.

## 2023-03-04 NOTE — H&P (Signed)
Hospital Admission History and Physical Service Pager: [phone]  Patient name: Adam Tignor [facility]. Medical record number: [id number] Date of Birth: [birth year] Age: 37 y.o. Gender: male  Primary Care Provider: Pcp, No Consultants: None  Code Status: Full  Preferred Emergency Contact:  Contact Information     Name Relation Home Work Mobile   [place] Spouse   [phone]   HIETGER,Sahib Father   [phone]        Chief Complaint: alcohol withdrawal   Assessment and Plan: Adam Andringa Czerwinski Montez Hageman. is a 37 y.o. male presenting with alcohol withdrawal. PMH significant for alcohol use disorder, HTN   * Alcohol withdrawal Drinks 1.5 fifths of alcohol per day. Last drink yesterday around 3pm. Wife believes he had a seizure this morning. On presentation patient diaphoretic and tremulous. Did have a seizure in ED that resolved with Ativan .  AST 209, ALT 199. Given hx of alcohol withdrawal seizures will admit for monitoring  May need ICU consult as well, as he was recently seen by ICU earlier this year and put on pheno barb taper. He expresses desire to quit drinking and states he has rehab set up for after he leaves.  - admit to progressive, attending Dr. Pollie Meyer - CIWAs q 4h with Ativan  - seizure precautions - NS IVF 125 ml/h x 12 h - continue MVI, thiamine, and folic acid  - consider ICU consult if needed  - TOC consult  for alcohol cessation resources  - npo until passes bedside swallow    Chronic conditions HTN- holding home Lisinopril given normotensive BP   FEN/GI: npo until bedside swallow eval  VTE Prophylaxis: Lovenox   Disposition: Pending workup   History of Present Illness:  Adam Friesen Rohl Montez Hageman. is a 37 y.o. male presenting with alcohol withdrawal  Drinks 1.5 fifths of alcohol per day. Last drink yesterday around 2:30 or 3pm. Wife told him he had a seizure this morning. Patient does not recall how much he drank yesterday. Endorses nausea and  intermittent vomiting as well as mild abdominal cramping. 1 episode of diarrhea yesterday. Denies fever, chills. Endorses hallucinations- sees spotty moving things and has been hearing things. Wants to quit drinking. Has tried to quit before. Does have hx of alcohol withdrawal seizures. No history of intubation. States he has some programs set up for him when he leaves here (?[facility])  In the ED, patient mildly diaphoretic and tremulous. Ethanol 215, AST 209, ALT 199. AKI with Cr 1.38 (baseline Cr 1). Normal trop. UDS pending. CIWA of 10  Did have a seizure that resolved with Ativan . Gave 1L LR bolus    Review Of Systems: Per HPI   Pertinent Past Medical History: HTN  Pertinent Social History: Tobacco use: None Alcohol use: 1.5 5ths a day for the past 3 years  Other Substance use: none  Lives with wife  Pertinent Family History: None  Remainder reviewed in history tab.   Important Outpatient Medications: :Lisinopril    Objective: BP 131/77   Pulse 93   Temp 97.7 F (36.5 C) (Oral)   Resp 16   Ht  (1.702 m)   Wt 77.1 kg   SpO2 97%   BMI 26.63 kg/m  Exam: General: sleeping in bed, NAD Eyes: PERRL. EOMI ENTM: MMM Neck: normal Cardiovascular: RRR no murmurs  Respiratory: CTAB normal WOB  Gastrointestinal: soft, non distended, non tender  MSK: moving extremities equally and spontaneously  Derm: warm, dry. No LE edema  Neuro: alert and oriented though sleepy. CN 2-12 in tact  Psych: mood and   Labs:  CBC BMET  Recent Labs  Lab 03/04/23 0947  WBC 4.6  HGB 14.3  HCT 40.8  PLT 196   Recent Labs  Lab 03/04/23 0947  NA 136  K 3.4*  CL 97*  CO2 26  BUN 14  CREATININE 1.38*  GLUCOSE 89  CALCIUM 8.8*    Pertinent additional labs: AST 209, ALT 199.   EKG: NSR   Imaging Studies Performed:  DG Chest 2 View No active cardiopulmonary disease   Cora Collum, DO 03/04/2023, 12:37 PM PGY-3, [facility]  FPTS Intern pager:  [phone], text pages welcome Secure chat group [facility] Teaching Service

## 2023-03-04 NOTE — Progress Notes (Signed)
FMTS Interim Progress Note  S: In to see patient at the bedside with Dr. Marsh Dolly.  He is resting comfortably in bed.  He is very pleasant, and conversational. He does say he is not feeling very well, has a headache and nausea and tremulousness with anxiety.  No vomiting or diarrhea.  However, he feels the Ativan is helpful.  Denies any auditory or visual hallucinations.  He shares that he feels embarrassed by his drinking and requirement for admissions, but that he is ready to quit drinking.   He currently works in Civil engineer, contracting towers. He has a great relationship with his wife of 5 years who packed him a lunch box with food.   O: BP 135/76 (BP Location: Left Arm)   Pulse 100   Temp 98.1 F (36.7 C) (Oral)   Resp 20   Ht 5\' 7"  (1.702 m)   Wt 79 kg   SpO2 93%   BMI 27.28 kg/m   General: Laying comfortably in the bed, pleasant CV: Regular rate and rhythm Respiratory: Normal work of breathing on room air Abdomen: Soft and nontender Extremities: Tremulousness in bilateral upper extremities Skin: No diaphoresis  A/P: Alcohol withdrawal Vital signs stable at this time.  Does have some tremulousness on exam with reports of headache and anxiety. CIWA elevated to 19 earlier this evening, decreased to 7 s/p 3 mg PRN Ativan -Plan to monitor CIWA's and vital signs closely this evening. -Seizure precautions -Will continue scheduled Ativan as well as PRN Ativan q4hwith CIWA -Low threshold to contact ICU for consult if needed given history of alcohol withdrawal seizures requiring ICU admission in the past, as recent as last month  Darral Dash, DO 03/04/2023, 10:31 PM PGY-2, [facility] Service pager [phone]

## 2023-03-04 NOTE — ED Triage Notes (Addendum)
Pt came in via POV d/t ETOH withdrawals. Pt was receiving Tx for his withdrawals couple of months ago at [facility] but left & tried to deal with it himself at home. Pt states that he has been going through this withdrawal cycle repetitively for so long that he is tired of it & needs help. States that his wife witnessed him having a seizure this morning, did fall & hit his head (not on thinners) & endorses audio/visual hallucinations, weird feeling in his head, 8/10 chest pain, ears ringing for 3 weeks & tremors, A/Ox4.

## 2023-03-04 NOTE — ED Notes (Signed)
ED TO INPATIENT HANDOFF REPORT  ED Nurse Name and Phone #: [phone]  S Name/Age/Gender Adam Peoples Boliver Jr. 37 y.o. male Room/Bed: 001C/001C  Code Status   Code Status: Full Code  Home/SNF/Other Home Patient oriented to: self, place, time, and situation Is this baseline? Yes   Triage Complete: Triage complete  Chief Complaint Alcohol withdrawal [F10.939]  Triage Note Pt came in via POV d/t ETOH withdrawals. Pt was receiving Tx for his withdrawals couple of months ago at [facility] but left & tried to deal with it himself at home. Pt states that he has been going through this withdrawal cycle repetitively for so long that he is tired of it & needs help. States that his wife witnessed him having a seizure this morning, did fall & hit his head (not on thinners) & endorses audio/visual hallucinations, weird feeling in his head, 8/10 chest pain, ears ringing for 3 weeks & tremors, A/Ox4.    Allergies Allergies  Allergen Reactions   Bee Venom Anaphylaxis    Level of Care/Admitting Diagnosis ED Disposition     ED Disposition  Admit   Condition  --   Comment  Hospital Area: [facility] [[id number]]  Level of Care: Progressive [102]  Admit to Progressive based on following criteria: ACUTE MENTAL DISORDER-RELATED Drug/Alcohol Ingestion/Overdose/Withdrawal, Suicidal Ideation/attempt requiring safety sitter and < Q2h monitoring/assessments, moderate to severe agitation that is managed with medication/sitter, CIWA-Ar score < 20.  May place patient in observation at [facility] or [facility] if equivalent level of care is available:: Yes  Covid Evaluation: Asymptomatic - no recent exposure (last 10 days) testing not required  Diagnosis: Alcohol withdrawal [291.81.ICD-9-CM]  Admitting Physician: Cora Collum [[id number]]  Attending Physician: Latrelle Dodrill [id number]          B Medical/Surgery History Past Medical History:  Diagnosis Date   Alcohol abuse     Eating disorder    history of bulemia   HYPERTENSION 06/25/2010   Seizure    ETOH Related Seizure   History reviewed. No pertinent surgical history.   A IV Location/Drains/Wounds Patient Lines/Drains/Airways Status     Active Line/Drains/Airways     Name Placement date Placement time Site Days   Peripheral IV 03/04/23 Left;Posterior Hand 03/04/23  0917  Hand  less than 1   Peripheral IV 03/04/23 20 G Right Antecubital 03/04/23  1606  Antecubital  less than 1            Intake/Output Last 24 hours No intake or output data in the 24 hours ending 03/04/23 1642  Labs/Imaging Results for orders placed or performed during the hospital encounter of 03/04/23 (from the past 48 hour(s))  Comprehensive metabolic panel     Status: Abnormal   Collection Time: 03/04/23  9:47 AM  Result Value Ref Range   Sodium 136 135 - 145 mmol/L   Potassium 3.4 (L) 3.5 - 5.1 mmol/L   Chloride 97 (L) 98 - 111 mmol/L   CO2 26 22 - 32 mmol/L   Glucose, Bld 89 70 - 99 mg/dL    Comment: Glucose reference range applies only to samples taken after fasting for at least 8 hours.   BUN 14 6 - 20 mg/dL   Creatinine, Ser 5.46 (H) 0.61 - 1.24 mg/dL   Calcium 8.8 (L) 8.9 - 10.3 mg/dL   Total Protein 6.3 (L) 6.5 - 8.1 g/dL   Albumin 3.9 3.5 - 5.0 g/dL   AST 568 (H) 15 - 41 U/L  ALT 199 (H) 0 - 44 U/L   Alkaline Phosphatase 57 38 - 126 U/L   Total Bilirubin 0.6 0.3 - 1.2 mg/dL   GFR, Estimated >81 >19 mL/min    Comment: (NOTE) Calculated using the CKD-EPI Creatinine Equation (2021)    Anion gap 13 5 - 15    Comment: Performed at [facility], [address], [place], [place] [address]  Ethanol     Status: Abnormal   Collection Time: 03/04/23  9:47 AM  Result Value Ref Range   Alcohol, Ethyl (B) 215 (H) <10 mg/dL    Comment: (NOTE) Lowest detectable limit for serum alcohol is 10 mg/dL.  For medical purposes only. Performed at [facility], [address], [place], [place] [address]    cbc     Status: Abnormal   Collection Time: 03/04/23  9:47 AM  Result Value Ref Range   WBC 4.6 4.0 - 10.5 K/uL   RBC 4.28 4.22 - 5.81 MIL/uL   Hemoglobin 14.3 13.0 - 17.0 g/dL   HCT 30.8 65.7 - 84.6 %   MCV 95.3 80.0 - 100.0 fL   MCH 33.4 26.0 - 34.0 pg   MCHC 35.0 30.0 - 36.0 g/dL   RDW 96.2 (L) 95.2 - 84.1 %   Platelets 196 150 - 400 K/uL   nRBC 0.0 0.0 - 0.2 %    Comment: Performed at [facility], [address], [place], [place] [address]  Magnesium     Status: None   Collection Time: 03/04/23  9:47 AM  Result Value Ref Range   Magnesium 2.1 1.7 - 2.4 mg/dL    Comment: Performed at [facility], [address], [place], [place] [address]  Phosphorus     Status: None   Collection Time: 03/04/23  9:47 AM  Result Value Ref Range   Phosphorus 3.1 2.5 - 4.6 mg/dL    Comment: Performed at [facility], [address], [place], [place] [address]  Troponin I (High Sensitivity)     Status: None   Collection Time: 03/04/23  9:47 AM  Result Value Ref Range   Troponin I (High Sensitivity) 3 <18 ng/L    Comment: (NOTE) Elevated high sensitivity troponin I (hsTnI) values and significant  changes across serial measurements may suggest ACS but many other  chronic and acute conditions are known to elevate hsTnI results.  Refer to the "Links" section for chest pain algorithms and additional  guidance. Performed at [facility], [address], [place], [place] [address]   Rapid urine drug screen (hospital performed)     Status: Abnormal   Collection Time: 03/04/23 11:00 AM  Result Value Ref Range   Opiates NONE DETECTED NONE DETECTED   Cocaine NONE DETECTED NONE DETECTED   Benzodiazepines POSITIVE (A) NONE DETECTED   Amphetamines NONE DETECTED NONE DETECTED   Tetrahydrocannabinol NONE DETECTED NONE DETECTED   Barbiturates POSITIVE (A) NONE DETECTED    Comment: (NOTE) DRUG SCREEN FOR MEDICAL PURPOSES ONLY.  IF CONFIRMATION IS NEEDED FOR ANY PURPOSE,  NOTIFY LAB WITHIN 5 DAYS.  LOWEST DETECTABLE LIMITS FOR URINE DRUG SCREEN Drug Class                     Cutoff (ng/mL) Amphetamine and metabolites    1000 Barbiturate and metabolites    200 Benzodiazepine                 200 Opiates and  metabolites        300 Cocaine and metabolites        300 THC                            50 Performed at [facility], [address], [place], [place] [address]   Troponin I (High Sensitivity)     Status: None   Collection Time: 03/04/23 11:37 AM  Result Value Ref Range   Troponin I (High Sensitivity) 2 <18 ng/L    Comment: (NOTE) Elevated high sensitivity troponin I (hsTnI) values and significant  changes across serial measurements may suggest ACS but many other  chronic and acute conditions are known to elevate hsTnI results.  Refer to the "Links" section for chest pain algorithms and additional  guidance. Performed at [facility], [address], [place], [place] [address]    DG Chest 2 View  Result Date: 03/04/2023 CLINICAL DATA:  Chest pain EXAM: CHEST - 2 VIEW COMPARISON:  Chest radiograph dated 09/12/2022 FINDINGS: Normal lung volumes. No focal consolidations. No pleural effusion or pneumothorax. The heart size and mediastinal contours are within normal limits. The visualized skeletal structures are unremarkable. IMPRESSION: No active cardiopulmonary disease. Electronically Signed   By: Agustin Cree M.D.   On: 03/04/2023 09:06    Pending Labs Unresulted Labs (From admission, onward)     Start     Ordered   03/05/23 0500  Comprehensive metabolic panel  Tomorrow morning,   R        03/04/23 1211            Vitals/Pain Today's Vitals   03/04/23 1000 03/04/23 1015 03/04/23 1300 03/04/23 1603  BP: 131/77  107/60 126/84  Pulse: 91 93 83 74  Resp: Temp:    98.2 F (36.8 C)  TempSrc:    Oral  SpO2: 95% 97% 95% 96%  Weight:      Height:      PainSc:    10-Worst pain ever    Isolation Precautions No  active isolations  Medications Medications  LORazepam (ATIVAN) tablet 1-4 mg ( Oral See Alternative 03/04/23 0913)    Or  LORazepam (ATIVAN) injection 1-4 mg (2 mg Intravenous Given 03/04/23 0913)  thiamine (VITAMIN B1) tablet 100 mg ( Oral See Alternative 03/04/23 1015)    Or  thiamine (VITAMIN B1) injection 100 mg (100 mg Intravenous Given 03/04/23 1015)  folic acid (FOLVITE) tablet 1 mg (1 mg Oral Given 03/04/23 1128)  multivitamin with minerals tablet 1 tablet (1 tablet Oral Given 03/04/23 1128)  enoxaparin (LOVENOX) injection 40 mg (40 mg Subcutaneous Given 03/04/23 1514)  0.9 %  sodium chloride infusion ( Intravenous New Bag/Given 03/04/23 1514)  LORazepam (ATIVAN) tablet 1 mg (1 mg Oral Given 03/04/23 1542)    Or  LORazepam (ATIVAN) injection 1 mg ( Intravenous See Alternative 03/04/23 1542)  ondansetron (ZOFRAN) injection 4 mg (4 mg Intravenous Given 03/04/23 0911)  famotidine (PEPCID) IVPB 20 mg premix (0 mg Intravenous Stopped 03/04/23 0948)  lactated ringers bolus 1,000 mL (0 mLs Intravenous Stopped 03/04/23 1640)  potassium chloride SA (KLOR-CON M) CR tablet 40 mEq (40 mEq Oral Given 03/04/23 1128)  LORazepam (ATIVAN) injection 2 mg (2 mg Intravenous Given 03/04/23 0912)    Mobility walks     Focused Assessments ETOH withdrawal   R Recommendations: See Admitting Provider Note  Report given to:   Additional  Notes:  Patient is on CIWA last was 10. I did give him the schedule Ativan 1 mg tablet. He suppose to get the CIWA Ativan.

## 2023-03-04 NOTE — ED Notes (Signed)
EDP aware of CIWA score of 30 & is at bedside primary RN made aware.

## 2023-03-05 DIAGNOSIS — F1090 Alcohol use, unspecified, uncomplicated: Secondary | ICD-10-CM | POA: Diagnosis not present

## 2023-03-05 DIAGNOSIS — I10 Essential (primary) hypertension: Secondary | ICD-10-CM | POA: Diagnosis not present

## 2023-03-05 DIAGNOSIS — R7401 Elevation of levels of liver transaminase levels: Secondary | ICD-10-CM | POA: Diagnosis not present

## 2023-03-05 DIAGNOSIS — R569 Unspecified convulsions: Secondary | ICD-10-CM

## 2023-03-05 DIAGNOSIS — F10932 Alcohol use, unspecified with withdrawal with perceptual disturbance: Secondary | ICD-10-CM | POA: Diagnosis not present

## 2023-03-05 LAB — COMPREHENSIVE METABOLIC PANEL
ALT: 164 U/L — ABNORMAL HIGH (ref 0–44)
AST: 170 U/L — ABNORMAL HIGH (ref 15–41)
Albumin: 3.2 g/dL — ABNORMAL LOW (ref 3.5–5.0)
Alkaline Phosphatase: 47 U/L (ref 38–126)
Anion gap: 11 (ref 5–15)
BUN: 12 mg/dL (ref 6–20)
CO2: 26 mmol/L (ref 22–32)
Calcium: 8.6 mg/dL — ABNORMAL LOW (ref 8.9–10.3)
Chloride: 100 mmol/L (ref 98–111)
Creatinine, Ser: 1.11 mg/dL (ref 0.61–1.24)
GFR, Estimated: >60 mL/min (ref >60)
Glucose, Bld: 99 mg/dL (ref 70–99)
Potassium: 3.8 mmol/L (ref 3.5–5.1)
Sodium: 137 mmol/L (ref 135–145)
Total Bilirubin: 0.6 mg/dL (ref 0.3–1.2)
Total Protein: 5.3 g/dL — ABNORMAL LOW (ref 6.5–8.1)

## 2023-03-05 MED ORDER — LISINOPRIL 20 MG PO TABS
20.0000 mg | ORAL_TABLET | Freq: Every day | ORAL | Status: DC
  Administered 2023-03-06 – 2023-03-07 (×2): 20 mg via ORAL
  Filled 2023-03-05 (×2): qty 1

## 2023-03-05 NOTE — Progress Notes (Signed)
Went to bedside to check on patient and update with plan. He was sitting up and getting ready to eat dinner. Feels much better, motivated to seek help outpatient with AA support group and also interested in medication to help with alcohol cessation. Denies any abdominal pain. He has no concerns at this time. He did mention chronic ear ringing that has been ongoing for 3 months- recommended outpt audiology evaluation.   Updated with plan: LFTs downtrending, withdrawal sx improving. May be able to d/c tomorrow pending how he does tonight. He reports he has a PCP he is planning to establish with on [address].

## 2023-03-05 NOTE — Progress Notes (Signed)
     Daily Progress Note Intern Pager: [phone]  Patient name: Adam [name] [facility]. Medical record number: [id number] Date of birth: 1986/04/17 Age: 37 y.o. Gender: male  Primary Care Provider: Pcp, No Consultants: None Code Status: Full  Pt Overview and Major Events to Date:  3/12-admitted  Assessment and Plan: Ledell Peoples Swint Montez Hageman. is a 37 y.o. male presenting with alcohol withdrawal. PMH significant for alcohol use disorder, HTN    * Alcohol withdrawal Drinks 1.5 fifths of alcohol per day. Last drink 4/11 around 3pm. Did have a seizure in ED that resolved with Ativan 2mg .  Has been stable with Ativan 1 mg every 4 hours.  Last had prn Ativan around 2 in the morning.  CIWA of 8 this morning with ativan given. He is sleeping on exam, difficult to waken with HR in 50's and RR around 10 with SpO2 in mid 90's.  AST 209>170, ALT 199>164.  -D/c scheduled Ativan 1 mg q4h  - continue Ativan 1 mg q1h prn - CIWAs q 4h with Ativan  - seizure precautions - continue MVI, thiamine, and folic acid  - consider ICU consult if needed  - TOC consult  for alcohol cessation resources     Chronic conditions HTN - Holding home lisinopril given normal BPs  FEN/GI: Regular diet PPx: Lovenox Dispo: Pending continued medical management  Subjective:  Sleeping soundly.  Was able to state yes when asked if he felt okay and stated yes when asked if he was feeling very sleepy.  Eyes were closed entire time.  Objective: Temp:  [97.6 F (36.4 C)-98.7 F (37.1 C)] 97.9 F (36.6 C) (04/13 1200) Pulse Rate:  [63-108] 70 (04/13 1200) Resp:  [14-20] 18 (04/13 0936) BP: (92-145)/(57-94) 145/94 (04/13 1200) SpO2:  [91 %-96 %] 95 % (04/13 1200) Weight:  [79 kg] 79 kg (04/12 1800) Physical Exam: General: 37 year old male, somnolent, difficult to wake but able to answer "yes" to questions then quickly back to sleep  Cardiovascular: Bradycardic, regular rhythm Respiratory: Breathing comfortably on  room air  Laboratory: Most recent CBC Lab Results  Component Value Date   WBC 4.6 03/04/2023   HGB 14.3 03/04/2023   HCT 40.8 03/04/2023   MCV 95.3 03/04/2023   PLT 196 03/04/2023   Most recent BMP    Latest Ref Rng & Units 03/05/2023   12:04 AM  BMP  Glucose 70 - 99 mg/dL 99   BUN 6 - 20 mg/dL 12   Creatinine 2.99 - 1.24 mg/dL 3.71   Sodium 696 - 789 mmol/L 137   Potassium 3.5 - 5.1 mmol/L 3.8   Chloride 98 - 111 mmol/L 100   CO2 22 - 32 mmol/L 26   Calcium 8.9 - 10.3 mg/dL 8.6      Erick Alley, DO 03/05/2023, 12:17 PM  PGY-1, [facility] FPTS Intern pager: [phone], text pages welcome Secure chat group [facility] Teaching Service

## 2023-03-05 NOTE — Progress Notes (Signed)
Entered information for the wrong time. Changes are fixed.

## 2023-03-06 ENCOUNTER — Observation Stay (HOSPITAL_COMMUNITY): Payer: Medicaid Other

## 2023-03-06 DIAGNOSIS — G40509 Epileptic seizures related to external causes, not intractable, without status epilepticus: Secondary | ICD-10-CM | POA: Diagnosis present

## 2023-03-06 DIAGNOSIS — E876 Hypokalemia: Secondary | ICD-10-CM | POA: Diagnosis present

## 2023-03-06 DIAGNOSIS — F10221 Alcohol dependence with intoxication delirium: Secondary | ICD-10-CM | POA: Diagnosis present

## 2023-03-06 DIAGNOSIS — R61 Generalized hyperhidrosis: Secondary | ICD-10-CM | POA: Diagnosis present

## 2023-03-06 DIAGNOSIS — F1090 Alcohol use, unspecified, uncomplicated: Secondary | ICD-10-CM | POA: Diagnosis not present

## 2023-03-06 DIAGNOSIS — R569 Unspecified convulsions: Secondary | ICD-10-CM | POA: Diagnosis not present

## 2023-03-06 DIAGNOSIS — Y907 Blood alcohol level of 200-239 mg/100 ml: Secondary | ICD-10-CM | POA: Diagnosis present

## 2023-03-06 DIAGNOSIS — Z79899 Other long term (current) drug therapy: Secondary | ICD-10-CM | POA: Diagnosis not present

## 2023-03-06 DIAGNOSIS — F10939 Alcohol use, unspecified with withdrawal, unspecified: Secondary | ICD-10-CM | POA: Diagnosis not present

## 2023-03-06 DIAGNOSIS — R7401 Elevation of levels of liver transaminase levels: Secondary | ICD-10-CM | POA: Diagnosis present

## 2023-03-06 DIAGNOSIS — F10232 Alcohol dependence with withdrawal with perceptual disturbance: Secondary | ICD-10-CM | POA: Diagnosis present

## 2023-03-06 DIAGNOSIS — I959 Hypotension, unspecified: Secondary | ICD-10-CM | POA: Diagnosis present

## 2023-03-06 DIAGNOSIS — F419 Anxiety disorder, unspecified: Secondary | ICD-10-CM | POA: Diagnosis present

## 2023-03-06 DIAGNOSIS — F10932 Alcohol use, unspecified with withdrawal with perceptual disturbance: Secondary | ICD-10-CM | POA: Diagnosis present

## 2023-03-06 DIAGNOSIS — H9319 Tinnitus, unspecified ear: Secondary | ICD-10-CM | POA: Diagnosis present

## 2023-03-06 DIAGNOSIS — N179 Acute kidney failure, unspecified: Secondary | ICD-10-CM | POA: Diagnosis present

## 2023-03-06 DIAGNOSIS — I10 Essential (primary) hypertension: Secondary | ICD-10-CM | POA: Diagnosis present

## 2023-03-06 LAB — COMPREHENSIVE METABOLIC PANEL
ALT: 150 U/L — ABNORMAL HIGH (ref 0–44)
AST: 117 U/L — ABNORMAL HIGH (ref 15–41)
Albumin: 3.4 g/dL — ABNORMAL LOW (ref 3.5–5.0)
Alkaline Phosphatase: 50 U/L (ref 38–126)
Anion gap: 9 (ref 5–15)
BUN: 9 mg/dL (ref 6–20)
CO2: 28 mmol/L (ref 22–32)
Calcium: 9.5 mg/dL (ref 8.9–10.3)
Chloride: 100 mmol/L (ref 98–111)
Creatinine, Ser: 1.04 mg/dL (ref 0.61–1.24)
GFR, Estimated: >60 mL/min (ref >60)
Glucose, Bld: 106 mg/dL — ABNORMAL HIGH (ref 70–99)
Potassium: 4 mmol/L (ref 3.5–5.1)
Sodium: 137 mmol/L (ref 135–145)
Total Bilirubin: 0.9 mg/dL (ref 0.3–1.2)
Total Protein: 5.7 g/dL — ABNORMAL LOW (ref 6.5–8.1)

## 2023-03-06 MED ORDER — MELATONIN 3 MG PO TABS
3.0000 mg | ORAL_TABLET | Freq: Every day | ORAL | Status: DC
  Administered 2023-03-06: 3 mg via ORAL
  Filled 2023-03-06: qty 1

## 2023-03-06 NOTE — Discharge Instructions (Signed)
Dear Adam [name].,  Thank you for letting us participate in your care. You were hospitalized for Alcohol withdrawal. You were treated with benzodiazepines to help with your withdrawal symptoms. We are happy that you are feeling better and are motivated to seek assistance as you work to abstain for alcohol. Please see the attached resources that are available to you.   POST-HOSPITAL & CARE INSTRUCTIONS I would encourage you to attend local AA meetings for support We are starting a medication called Acamprosate which can help you abstain from alcohol Go to your appointment below  DOCTOR'S APPOINTMENT   Future Appointments  Date Time Provider Department Center  03/21/2023  4:10 PM Alicia Amel, MD [facility]    Follow-up Information     Alicia Amel, MD Follow up on 03/21/2023.   Specialty: Family Medicine Why: Dr. Marisue Humble would love to see you for hospital follow-up. You have an appointment at 4:10pm. Please arrive fifteen minutes early. Contact information: [address] [place] [address] [phone]                Take care and be well!  Family Medicine Teaching Service Inpatient Team [place]  [facility]  [address] [place], [place] [address] [phone]

## 2023-03-06 NOTE — Progress Notes (Signed)
Pt scored a CIWA of 22 when completeing the subjective assess. Pt was lethargic and falling asleep while talking. MD paged and notified that the pt was presenting different than his CIWA score. MD stated he would come to bedside and assess the patient.  MD called and stated to offer the pt 1mg  of ativan.

## 2023-03-06 NOTE — Assessment & Plan Note (Signed)
Previously hypotensive on admission and lisinopril held. BP now consistently elevated. -Restart home Lisinopril 20 mg daily

## 2023-03-06 NOTE — Progress Notes (Signed)
Patient is asking for something for sleep. MD paged. Continue to follow.

## 2023-03-06 NOTE — Progress Notes (Signed)
FMTS Interim Progress Note  S: Into see the patient at 8 PM after signout.  Resting comfortably in bed.  He says he is feeling ready to go home, asked if he thinks that tomorrow would be the day. He says he was able to eat today, had some [place].  Currently denies any nausea, vomiting, headache. He was asking about the CT head and what the results showed, he is familiar that he has advanced atrophy of the brain.  I shared with him that the results were otherwise negative.  He asked if he should follow-up outpatient with his PCP for tinnitus, which I agreed with.  Also discussed with dayshift RN and nighttime RN.  Dayshift RN says that he was getting quite sleepy after as needed Ativan doses today. Night shift RN Shanda Bumps has cared for him during this admission so far.  O: BP 123/84   Pulse 72   Temp 97.7 F (36.5 C) (Oral)   Resp 12   Ht 5\' 7"  (1.702 m)   Wt 79 kg   SpO2 98%   BMI 27.28 kg/m   General: Pleasant, no distress, laying comfortably in the bed CV: Regular rate and rhythm Respiratory: Normal work of breathing on room air Abdomen: Soft nontender nondistended Neuro: A&O, speech clear and fluent, does seem a bit sleepy  A/P: Alcohol withdrawal Currently stable.  Vital signs reviewed.  Labs and orders also reviewed.   -CIWA every 4 hours with as needed Ativan; recheck CIWA in 1 hour if as needed Ativan is administered -Discussed with nighttime RN that she does not have to follow the scoring system correlating with dosage, that we can give 1 to 2 mg for elevated CIWA scoring. -Also shared with her that she should feel free to page me at number below if any concerns and I will come to bedside and evaluate the patient  -Do not want to exacerbate withdrawal by avoiding appropriate dosage, but also do not want to cause significant CNS depression with benzodiazepine.   Darral Dash, DO 03/06/2023, 8:28 PM PGY-2, [facility] Family Medicine Service pager [phone]

## 2023-03-06 NOTE — Progress Notes (Signed)
     Daily Progress Note Intern Pager: [phone]  [facility]: [name]. Medical record number: [id number] Date of birth: Mar 14, 1986 Age: 37 y.o. Gender: male  Primary Care Provider: Pcp, No Consultants: None Code Status: FULL  Pt Overview and Major Events to Date:  3/12: Admitted  Assessment and Plan: Adam Peoples Huffstetler Montez Hageman. is a 37 y.o. male with a history of alcohol use disorder admitted for acute alcohol withdrawal. He is now stable.  Pertinent PMH/PSH includes HTN.   * Alcohol withdrawal Drinks 1.5 fifths of alcohol per day. Last drink 4/11 around 3pm. LFTs are downtrending appropriately (AST 170>117, ALT 164>150). Off of scheduled ativan and only required 4 mg of PRN ativan in last 24 hours. CIWA scores 2>>1>18 (at 5AM). Received 3mg  of Ativan for CIWA of 18.  - continue Ativan 1 mg q1h prn - CIWAs q 4h  - continue MVI, thiamine, and folic acid  - Start Acamprosate at d/c (naltrexone contraindicated at this time given elevated LFTs) - Encourage AA support meetings outpt   Primary hypertension Previously hypotensive on admission and lisinopril held. BP now consistently elevated. -Restart home Lisinopril 20 mg daily    FEN/GI: Regular PPx: Lovenox Dispo:Home pending clinical improvement     Subjective:  Was feeling well and then started to have worsening symptoms of tremors and diaphoresis starting this morning around 3AM. Has received 3mg  of Ativan for elevated CIWA and symptoms are improved at this time. He did have to remove the linens from his beds due to diaphoresis.   He was hopeful to discharge today but understand the risks of withdrawal. He recalls having to be admitted to ICU at [facility] in the past. He is willing to stay for additional monitoring.   Cardiac telemetry in room was alerting STE in leads III and V3. EKG shows sinus rhythm. Patient denies any chest pain or SOB.    Objective: Temp:  [97.6 F (36.4 C)-98.2 F (36.8 C)] 98.2 F (36.8  C) (04/14 0428) Pulse Rate:  [62-91] 75 (04/14 0428) Resp:  [12-18] 12 (04/14 0428) BP: (125-156)/(74-103) 126/86 (04/14 0428) SpO2:  [94 %-98 %] 95 % (04/14 0428) Physical Exam: General: Awake, alert, pleasant, NAD, no tremors noted  Cardiovascular: RRR without murmur Respiratory: CTAB anterior fields  Abdomen: soft, non-distended, nttp in all quadrants Extremities: No edema   Laboratory: Most recent CBC Lab Results  Component Value Date   WBC 4.6 03/04/2023   HGB 14.3 03/04/2023   HCT 40.8 03/04/2023   MCV 95.3 03/04/2023   PLT 196 03/04/2023   Most recent BMP    Latest Ref Rng & Units 03/06/2023   12:12 AM  BMP  Glucose 70 - 99 mg/dL 093   BUN 6 - 20 mg/dL 9   Creatinine 2.67 - 1.24 mg/dL 5.80   Sodium 998 - 338 mmol/L 137   Potassium 3.5 - 5.1 mmol/L 4.0   Chloride 98 - 111 mmol/L 100   CO2 22 - 32 mmol/L 28   Calcium 8.9 - 10.3 mg/dL 9.5    Imaging/Diagnostic Tests: None new.   Sabino Dick, DO 03/06/2023, 6:15 AM  PGY-3, [place] [facility] FPTS Intern pager: [phone], text pages welcome Secure chat group [facility] Teaching Service

## 2023-03-06 NOTE — Hospital Course (Signed)
Ledell Peoples Kittell Montez Hageman. is a 37 y.o. male who was admitted to [facility] on 4/12 for acute alcohol withdrawal.  His hospital course is listed below by problem, refer to the H&P for additional information.  Acute alcohol withdrawal In the ED, patient diaphoretic and tremulous.  Did have a seizure in ED.  Received scheduled Ativan for 24 hours CIWA monitoring.  Labs notable for elevated AST/ALT. LFTs initially trended down, however increased at day of discharge and recommend OP follow-up.  CIWA trended to 1>1, and was no longer needing PRN Ativan for 24 hours prior to discharge.  Was provided with alcohol cessation resources at discharge.  Patient appears highly motivated to stop drinking.  Was started on acamprosate at discharge to help with cessation.  Librium taper started at discharge, patient instructed not to drive or operate heavy machinery for 3 days during taper. Naltrexone contraindicated in the setting of elevated LFTs.  Hypertension His home lisinopril was initially held given normotensive/soft blood pressures during acute withdrawal.  Ultimately started on day of discharge.  PCP follow up issues:  Recheck CMP to evaluate LFT Continue to help with alcohol cessation. May consider switch to Naltrexone if LFT back to normal

## 2023-03-07 ENCOUNTER — Other Ambulatory Visit (HOSPITAL_COMMUNITY): Payer: Self-pay

## 2023-03-07 DIAGNOSIS — R569 Unspecified convulsions: Secondary | ICD-10-CM | POA: Diagnosis not present

## 2023-03-07 DIAGNOSIS — R7401 Elevation of levels of liver transaminase levels: Secondary | ICD-10-CM | POA: Diagnosis not present

## 2023-03-07 DIAGNOSIS — F10932 Alcohol use, unspecified with withdrawal with perceptual disturbance: Secondary | ICD-10-CM | POA: Diagnosis not present

## 2023-03-07 LAB — COMPREHENSIVE METABOLIC PANEL
ALT: 221 U/L — ABNORMAL HIGH (ref 0–44)
AST: 230 U/L — ABNORMAL HIGH (ref 15–41)
Albumin: 3.7 g/dL (ref 3.5–5.0)
Alkaline Phosphatase: 57 U/L (ref 38–126)
Anion gap: 9 (ref 5–15)
BUN: 11 mg/dL (ref 6–20)
CO2: 26 mmol/L (ref 22–32)
Calcium: 9.7 mg/dL (ref 8.9–10.3)
Chloride: 102 mmol/L (ref 98–111)
Creatinine, Ser: 1.24 mg/dL (ref 0.61–1.24)
GFR, Estimated: >60 mL/min (ref >60)
Glucose, Bld: 99 mg/dL (ref 70–99)
Potassium: 4.1 mmol/L (ref 3.5–5.1)
Sodium: 137 mmol/L (ref 135–145)
Total Bilirubin: 0.7 mg/dL (ref 0.3–1.2)
Total Protein: 6.2 g/dL — ABNORMAL LOW (ref 6.5–8.1)

## 2023-03-07 MED ORDER — THIAMINE HCL 100 MG PO TABS
100.0000 mg | ORAL_TABLET | Freq: Every day | ORAL | 0 refills | Status: DC
  Filled 2023-03-07: qty 30, 30d supply, fill #0

## 2023-03-07 MED ORDER — CHLORDIAZEPOXIDE HCL 5 MG PO CAPS
ORAL_CAPSULE | ORAL | 0 refills | Status: DC
  Filled 2023-03-07: qty 12, 3d supply, fill #0

## 2023-03-07 MED ORDER — ACAMPROSATE CALCIUM 333 MG PO TBEC
666.0000 mg | DELAYED_RELEASE_TABLET | Freq: Three times a day (TID) | ORAL | 0 refills | Status: DC
  Filled 2023-03-07: qty 180, 30d supply, fill #0

## 2023-03-07 MED ORDER — LISINOPRIL 20 MG PO TABS
20.0000 mg | ORAL_TABLET | Freq: Every day | ORAL | 1 refills | Status: DC
  Filled 2023-03-07: qty 30, 30d supply, fill #0

## 2023-03-07 MED ORDER — ADULT MULTIVITAMIN W/MINERALS CH
1.0000 | ORAL_TABLET | Freq: Every day | ORAL | 0 refills | Status: DC
  Filled 2023-03-07: qty 30, 30d supply, fill #0

## 2023-03-07 NOTE — Progress Notes (Signed)
     Daily Progress Note Intern Pager: [phone]  [facility]: [name]. Medical record number: [id number] Date of birth: 1986-09-14 Age: 37 y.o. Gender: male  Primary Care Provider: Pcp, No Consultants: None Code Status: FULL  Pt Overview and Major Events to Date:  3/12: Admitted   Assessment and Plan: Adam [name]. is a 37 y.o. male with a history of alcohol use disorder admitted for acute alcohol withdrawal. He is now stable.  Pertinent PMH/PSH includes HTN.  * Alcohol withdrawal Last drink 4/11 ~3pm. LFTs were trending down, and now increased. Off scheduled ativan. Last PRN ativan 1mg  yesterday @ 4PM. CIWA 3>4>1. - continue Ativan 1 mg q1h prn - CIWAs q 4h  - continue MVI, thiamine, and folic acid  - Start Acamprosate at d/c (naltrexone contraindicated at this time given elevated LFTs) - Encourage AA support meetings outpt  Primary hypertension Previously hypotensive on admission and lisinopril held. BP now consistently elevated. -Restart home Lisinopril 20 mg daily    FEN/GI: Regular PPx: Lovenox Dispo:Home pending clinical improvement   Subjective:  Would like to leave today.  Objective: Temp:  [97.4 F (36.3 C)-98.5 F (36.9 C)] 97.8 F (36.6 C) (04/15 0722) Pulse Rate:  [61-93] 68 (04/15 0800) Resp:  [12-17] 17 (04/14 2023) BP: (106-155)/(72-117) 122/78 (04/15 0800) SpO2:  [96 %-98 %] 97 % (04/15 0722) Physical Exam: General: NAD, awake alert and active Cardiovascular: RRR, no murmurs Respiratory: CTAB, normal WOB on RA Abdomen: Soft, not tender, not distended Extremities: Moving all 4 extremities Neuro: Alert and oriented x4, no overt neurologic deficits, no tremors  Laboratory: Most recent CBC Lab Results  Component Value Date   WBC 4.6 03/04/2023   HGB 14.3 03/04/2023   HCT 40.8 03/04/2023   MCV 95.3 03/04/2023   PLT 196 03/04/2023   Most recent BMP    Latest Ref Rng & Units 03/07/2023   12:29 AM  BMP  Glucose  70 - 99 mg/dL 99   BUN 6 - 20 mg/dL 11   Creatinine 3.34 - 1.24 mg/dL 3.56   Sodium 861 - 683 mmol/L 137   Potassium 3.5 - 5.1 mmol/L 4.1   Chloride 98 - 111 mmol/L 102   CO2 22 - 32 mmol/L 26   Calcium 8.9 - 10.3 mg/dL 9.7    Adam Kocher, DO 03/07/2023, 9:14 AM  PGY-1, [place] [facility] FPTS Intern pager: [phone], text pages welcome Secure chat group [facility] Teaching Service

## 2023-03-07 NOTE — Discharge Summary (Addendum)
Family Medicine Teaching Service [facility] Discharge Summary  Patient name: Adam [name] [facility]. Medical record number: [id number] Date of birth: 1986/07/18 Age: 37 y.o. Gender: male Date of Admission: 03/04/2023  Date of Discharge: 03/07/2023 Admitting Physician: Cora Collum, DO  Primary Care Provider: Pcp, No Consultants: None  Indication for Hospitalization: Alcohol withdrawal  Brief Hospital Course:  Adam [name]. is a 37 y.o. male who was admitted to [facility] on 4/12 for acute alcohol withdrawal.  His hospital course is listed below by problem, refer to the H&P for additional information.  Acute alcohol withdrawal In the ED, patient diaphoretic and tremulous.  Did have a seizure in ED.  Received scheduled Ativan for 24 hours CIWA monitoring.  Labs notable for elevated AST/ALT. LFTs initially trended down, however increased at day of discharge and recommend OP follow-up.  CIWA trended to 1>1, and was no longer needing PRN Ativan for 24 hours prior to discharge.  Was provided with alcohol cessation resources at discharge.  Patient appears highly motivated to stop drinking.  Was started on acamprosate at discharge to help with cessation.  Librium taper started at discharge, patient instructed not to drive or operate heavy machinery for 3 days during taper. Naltrexone contraindicated in the setting of elevated LFTs.  Hypertension His home lisinopril was initially held given normotensive/soft blood pressures during acute withdrawal.  Ultimately started on day of discharge.  PCP follow up issues:  Recheck CMP to evaluate LFT Continue to help with alcohol cessation. May consider switch to Naltrexone if LFT back to normal  Discharge Diagnoses/Problem List:  Principal Problem:   Alcohol withdrawal Active Problems:   Transaminitis   Primary hypertension   Alcohol use disorder  Disposition: Home  Discharge Condition: Stable  Discharge Exam: Per progress  note 03/07/2023 General: NAD, awake alert and active Cardiovascular: RRR, no murmurs Respiratory: CTAB, normal WOB on RA Abdomen: Soft, not tender, not distended Extremities: Moving all 4 extremities Neuro: Alert and oriented x4, no overt neurologic deficits, no tremors  Significant Labs and Imaging:  No results for input(s): "WBC", "HGB", "HCT", "PLT" in the last 48 hours. Recent Labs  Lab 03/06/23 0012 03/07/23 0029  NA 137 137  K 4.0 4.1  CL 100 102  CO2 28 26  GLUCOSE 106* 99  BUN 9 11  CREATININE 1.04 1.24  CALCIUM 9.5 9.7  ALKPHOS 50 57  AST 117* 230*  ALT 150* 221*  ALBUMIN 3.4* 3.7   Discharge Medications:  Allergies as of 03/07/2023       Reactions   Bee Venom Anaphylaxis        Medication List     STOP taking these medications    gabapentin 300 MG capsule Commonly known as: Neurontin   levETIRAcetam 500 MG tablet Commonly known as: KEPPRA       TAKE these medications    acamprosate 333 MG tablet Commonly known as: CAMPRAL Take 2 tablets (666 mg total) by mouth 3 (three) times daily with meals.   chlordiazePOXIDE 5 MG capsule Commonly known as: LIBRIUM Take 2 capsules three times a day for 1 day, then take 1 capsule three times a day for 2 days, then stop. What changed:  medication strength additional instructions   folic acid 1 MG tablet Commonly known as: FOLVITE Take 1 tablet (1 mg total) by mouth daily. What changed: Another medication with the same name was removed. Continue taking this medication, and follow the directions you see here.   lisinopril  20 MG tablet Commonly known as: ZESTRIL Take 1 tablet (20 mg total) by mouth daily. What changed: how much to take   multivitamin with minerals Tabs tablet Take 1 tablet by mouth daily.   thiamine 100 MG tablet Commonly known as: VITAMIN B1 Take 1 tablet (100 mg total) by mouth daily.       Discharge Instructions: Please refer to Patient Instructions section of EMR for full  details.  Patient was counseled important signs and symptoms that should prompt return to medical care, changes in medications, dietary instructions, activity restrictions, and follow up appointments.   Follow-Up Appointments:  Follow-up Information     Alicia Amel, MD Follow up on 03/21/2023.   Specialty: Family Medicine Why: Dr. Marisue Humble would love to see you for hospital follow-up. You have an appointment at 4:10pm. Please arrive fifteen minutes early. Contact information: [address] [place] [address] [phone]                 I reviewed medical decision making and verified the service and findings are accurately documented in the intern's note.  Erick Alley, DO 03/07/2023 2:23 PM  Tiffany Kocher, DO 03/07/2023, 2:00 PM PGY-1, [facility] Family Medicine

## 2023-03-07 NOTE — TOC Transition Note (Signed)
Transition of Care [facility]) - CM/SW Discharge Note   Patient Details  Name: Mandel Cotugno [name]. MRN: [id number] Date of Birth: 04/29/1986  [facility]) CM/SW Contact:  Harriet Masson, RN Phone Number: 03/07/2023, 2:20 PM   Clinical Narrative:    Patient stable for discharge.  Patient states he can afford his discharge medications.  Hospital follow up apt on AVS. Patient has transportation home.    Final next level of care: Home/Self Care Barriers to Discharge: Barriers Resolved   Patient Goals and CMS Choice     Return home Discharge Placement                   home      Discharge Plan and Services Additional resources added to the After Visit Summary for                                       Social Determinants of Health (SDOH) Interventions SDOH Screenings   Food Insecurity: No Food Insecurity (03/04/2023)  Housing: Low Risk  (03/04/2023)  Transportation Needs: No Transportation Needs (03/04/2023)  Utilities: Not At Risk (03/04/2023)  Alcohol Screen: Medium Risk (04/29/2019)  Tobacco Use: High Risk (03/04/2023)     Readmission Risk Interventions     No data to display

## 2023-03-21 ENCOUNTER — Inpatient Hospital Stay: Payer: Self-pay | Admitting: Student

## 2023-03-21 NOTE — Progress Notes (Deleted)
    SUBJECTIVE:   CHIEF COMPLAINT / HPI:   Recent admit for etOH WDL. D/C'd home on Acamprosate for cravings and a librium taper.   PERTINENT  PMH / PSH: ***  OBJECTIVE:   There were no vitals taken for this visit.  ***  ASSESSMENT/PLAN:   No problem-specific Assessment & Plan notes found for this encounter.     Eliezer Mccoy, MD [facility]

## 2023-03-25 ENCOUNTER — Other Ambulatory Visit (HOSPITAL_COMMUNITY): Payer: Self-pay

## 2023-03-28 ENCOUNTER — Emergency Department (HOSPITAL_COMMUNITY): Payer: Medicaid Other

## 2023-03-28 ENCOUNTER — Inpatient Hospital Stay (HOSPITAL_COMMUNITY): Payer: Medicaid Other

## 2023-03-28 ENCOUNTER — Observation Stay (HOSPITAL_COMMUNITY)
Admission: EM | Admit: 2023-03-28 | Discharge: 2023-03-28 | DRG: 897 | Disposition: A | Discharge status: Home / Self Care | Payer: Medicaid Other | Attending: Internal Medicine | Admitting: Internal Medicine

## 2023-03-28 ENCOUNTER — Inpatient Hospital Stay (HOSPITAL_COMMUNITY)
Admission: RE | Admit: 2023-03-28 | Discharge: 2023-03-30 | DRG: 897 | Disposition: A | Discharge status: Home / Self Care | Payer: Medicaid Other | Attending: Internal Medicine | Admitting: Internal Medicine

## 2023-03-28 ENCOUNTER — Other Ambulatory Visit: Payer: Self-pay

## 2023-03-28 DIAGNOSIS — W109XXA Fall (on) (from) unspecified stairs and steps, initial encounter: Secondary | ICD-10-CM | POA: Diagnosis present

## 2023-03-28 DIAGNOSIS — F1010 Alcohol abuse, uncomplicated: Secondary | ICD-10-CM | POA: Diagnosis present

## 2023-03-28 DIAGNOSIS — R569 Unspecified convulsions: Secondary | ICD-10-CM | POA: Diagnosis not present

## 2023-03-28 DIAGNOSIS — I10 Essential (primary) hypertension: Secondary | ICD-10-CM | POA: Diagnosis present

## 2023-03-28 DIAGNOSIS — Z886 Allergy status to analgesic agent status: Secondary | ICD-10-CM

## 2023-03-28 DIAGNOSIS — R441 Visual hallucinations: Secondary | ICD-10-CM | POA: Diagnosis present

## 2023-03-28 DIAGNOSIS — F10229 Alcohol dependence with intoxication, unspecified: Principal | ICD-10-CM | POA: Diagnosis present

## 2023-03-28 DIAGNOSIS — G312 Degeneration of nervous system due to alcohol: Secondary | ICD-10-CM | POA: Diagnosis present

## 2023-03-28 DIAGNOSIS — Z83438 Family history of other disorder of lipoprotein metabolism and other lipidemia: Secondary | ICD-10-CM

## 2023-03-28 DIAGNOSIS — Z808 Family history of malignant neoplasm of other organs or systems: Secondary | ICD-10-CM

## 2023-03-28 DIAGNOSIS — Z7952 Long term (current) use of systemic steroids: Secondary | ICD-10-CM

## 2023-03-28 DIAGNOSIS — Z818 Family history of other mental and behavioral disorders: Secondary | ICD-10-CM

## 2023-03-28 DIAGNOSIS — F17220 Nicotine dependence, chewing tobacco, uncomplicated: Secondary | ICD-10-CM | POA: Diagnosis present

## 2023-03-28 DIAGNOSIS — Z833 Family history of diabetes mellitus: Secondary | ICD-10-CM

## 2023-03-28 DIAGNOSIS — Z801 Family history of malignant neoplasm of trachea, bronchus and lung: Secondary | ICD-10-CM

## 2023-03-28 DIAGNOSIS — Y908 Blood alcohol level of 240 mg/100 ml or more: Secondary | ICD-10-CM | POA: Diagnosis present

## 2023-03-28 DIAGNOSIS — F10939 Alcohol use, unspecified with withdrawal, unspecified: Secondary | ICD-10-CM | POA: Diagnosis not present

## 2023-03-28 DIAGNOSIS — K760 Fatty (change of) liver, not elsewhere classified: Secondary | ICD-10-CM | POA: Diagnosis present

## 2023-03-28 DIAGNOSIS — F32A Depression, unspecified: Secondary | ICD-10-CM | POA: Diagnosis present

## 2023-03-28 DIAGNOSIS — G9340 Encephalopathy, unspecified: Secondary | ICD-10-CM | POA: Diagnosis not present

## 2023-03-28 DIAGNOSIS — R440 Auditory hallucinations: Secondary | ICD-10-CM | POA: Diagnosis present

## 2023-03-28 DIAGNOSIS — R7401 Elevation of levels of liver transaminase levels: Secondary | ICD-10-CM

## 2023-03-28 DIAGNOSIS — Z8249 Family history of ischemic heart disease and other diseases of the circulatory system: Secondary | ICD-10-CM

## 2023-03-28 DIAGNOSIS — G4089 Other seizures: Secondary | ICD-10-CM | POA: Diagnosis present

## 2023-03-28 DIAGNOSIS — F419 Anxiety disorder, unspecified: Secondary | ICD-10-CM | POA: Diagnosis present

## 2023-03-28 DIAGNOSIS — Z91030 Bee allergy status: Secondary | ICD-10-CM

## 2023-03-28 DIAGNOSIS — F10139 Alcohol abuse with withdrawal, unspecified: Principal | ICD-10-CM | POA: Diagnosis present

## 2023-03-28 DIAGNOSIS — E7800 Pure hypercholesterolemia, unspecified: Secondary | ICD-10-CM | POA: Diagnosis present

## 2023-03-28 DIAGNOSIS — Z79899 Other long term (current) drug therapy: Secondary | ICD-10-CM

## 2023-03-28 DIAGNOSIS — Y92009 Unspecified place in unspecified non-institutional (private) residence as the place of occurrence of the external cause: Secondary | ICD-10-CM

## 2023-03-28 DIAGNOSIS — R159 Full incontinence of feces: Secondary | ICD-10-CM | POA: Diagnosis present

## 2023-03-28 DIAGNOSIS — D696 Thrombocytopenia, unspecified: Secondary | ICD-10-CM | POA: Diagnosis present

## 2023-03-28 LAB — CBC WITH DIFFERENTIAL/PLATELET
Abs Immature Granulocytes: 0.01 10*3/uL (ref 0.00–0.07)
Basophils Absolute: 0.1 10*3/uL (ref 0.0–0.1)
Basophils Relative: 1 %
Eosinophils Absolute: 0 10*3/uL (ref 0.0–0.5)
Eosinophils Relative: 0 %
HCT: 37.3 % — ABNORMAL LOW (ref 39.0–52.0)
Hemoglobin: 13 g/dL (ref 13.0–17.0)
Immature Granulocytes: 0 %
Lymphocytes Relative: 33 %
Lymphs Abs: 1.7 10*3/uL (ref 0.7–4.0)
MCH: 33.4 pg (ref 26.0–34.0)
MCHC: 34.9 g/dL (ref 30.0–36.0)
MCV: 95.9 fL (ref 80.0–100.0)
Monocytes Absolute: 0.3 10*3/uL (ref 0.1–1.0)
Monocytes Relative: 6 %
Neutro Abs: 3.1 10*3/uL (ref 1.7–7.7)
Neutrophils Relative %: 60 %
Platelets: 144 10*3/uL — ABNORMAL LOW (ref 150–400)
RBC: 3.89 MIL/uL — ABNORMAL LOW (ref 4.22–5.81)
RDW: 11.6 % (ref 11.5–15.5)
WBC: 5.3 10*3/uL (ref 4.0–10.5)
nRBC: 0 % (ref 0.0–0.2)

## 2023-03-28 LAB — HEPATITIS PANEL, ACUTE
HCV Ab: NONREACTIVE
Hep A IgM: NONREACTIVE
Hep B C IgM: NONREACTIVE
Hepatitis B Surface Ag: NONREACTIVE

## 2023-03-28 LAB — RAPID URINE DRUG SCREEN, HOSP PERFORMED
Amphetamines: NOT DETECTED
Barbiturates: NOT DETECTED
Benzodiazepines: POSITIVE — AB
Cocaine: NOT DETECTED
Opiates: NOT DETECTED
Tetrahydrocannabinol: NOT DETECTED

## 2023-03-28 LAB — COMPREHENSIVE METABOLIC PANEL
ALT: 172 U/L — ABNORMAL HIGH (ref 0–44)
AST: 165 U/L — ABNORMAL HIGH (ref 15–41)
Albumin: 4.4 g/dL (ref 3.5–5.0)
Alkaline Phosphatase: 56 U/L (ref 38–126)
Anion gap: 16 — ABNORMAL HIGH (ref 5–15)
BUN: 11 mg/dL (ref 6–20)
CO2: 22 mmol/L (ref 22–32)
Calcium: 8.9 mg/dL (ref 8.9–10.3)
Chloride: 99 mmol/L (ref 98–111)
Creatinine, Ser: 1.19 mg/dL (ref 0.61–1.24)
GFR, Estimated: >60 mL/min (ref >60)
Glucose, Bld: 98 mg/dL (ref 70–99)
Potassium: 3.6 mmol/L (ref 3.5–5.1)
Sodium: 137 mmol/L (ref 135–145)
Total Bilirubin: 0.8 mg/dL (ref 0.3–1.2)
Total Protein: 6.7 g/dL (ref 6.5–8.1)

## 2023-03-28 LAB — ETHANOL: Alcohol, Ethyl (B): 300 mg/dL — ABNORMAL HIGH (ref <10)

## 2023-03-28 LAB — URINALYSIS, ROUTINE W REFLEX MICROSCOPIC
Bilirubin Urine: NEGATIVE
Glucose, UA: NEGATIVE mg/dL
Hgb urine dipstick: NEGATIVE
Ketones, ur: 5 mg/dL — AB
Leukocytes,Ua: NEGATIVE
Nitrite: NEGATIVE
Protein, ur: NEGATIVE mg/dL
Specific Gravity, Urine: 1.014 (ref 1.005–1.030)
pH: 6 (ref 5.0–8.0)

## 2023-03-28 LAB — CBG MONITORING, ED: Glucose-Capillary: 80 mg/dL (ref 70–99)

## 2023-03-28 LAB — MAGNESIUM: Magnesium: 1.7 mg/dL (ref 1.7–2.4)

## 2023-03-28 LAB — HIV ANTIBODY (ROUTINE TESTING W REFLEX): HIV Screen 4th Generation wRfx: NONREACTIVE

## 2023-03-28 LAB — VITAMIN B12: Vitamin B-12: 448 pg/mL (ref 180–914)

## 2023-03-28 MED ORDER — POLYETHYLENE GLYCOL 3350 17 G PO PACK: 17.0000 g | PACK | Freq: Every day | ORAL | Status: DC | PRN

## 2023-03-28 MED ORDER — THIAMINE HCL 100 MG/ML IJ SOLN: 100.0000 mg | Freq: Every day | INTRAMUSCULAR | Status: DC

## 2023-03-28 MED ORDER — LORAZEPAM 1 MG PO TABS: 2.0000 mg | ORAL_TABLET | Freq: Four times a day (QID) | ORAL | Status: DC

## 2023-03-28 MED ORDER — ACETAMINOPHEN 325 MG PO TABS
650.0000 mg | ORAL_TABLET | Freq: Four times a day (QID) | ORAL | Status: DC | PRN
  Administered 2023-03-28 – 2023-03-30 (×4): 650 mg via ORAL
  Filled 2023-03-28 (×4): qty 2

## 2023-03-28 MED ORDER — LORAZEPAM 1 MG PO TABS
2.0000 mg | ORAL_TABLET | Freq: Once | ORAL | Status: AC
  Administered 2023-03-28: 2 mg via ORAL
  Filled 2023-03-28: qty 2

## 2023-03-28 MED ORDER — SODIUM CHLORIDE 0.9 % IV BOLUS
1000.0000 mL | Freq: Once | INTRAVENOUS | Status: AC
  Administered 2023-03-28: 1000 mL via INTRAVENOUS

## 2023-03-28 MED ORDER — LORAZEPAM 1 MG PO TABS: 2.0000 mg | ORAL_TABLET | Freq: Two times a day (BID) | ORAL | Status: DC

## 2023-03-28 MED ORDER — ENOXAPARIN SODIUM 40 MG/0.4ML IJ SOSY
40.0000 mg | PREFILLED_SYRINGE | INTRAMUSCULAR | Status: DC
  Filled 2023-03-28: qty 0.4

## 2023-03-28 MED ORDER — ONDANSETRON HCL 4 MG/2ML IJ SOLN
4.0000 mg | Freq: Once | INTRAMUSCULAR | Status: AC
  Administered 2023-03-28: 4 mg via INTRAVENOUS
  Filled 2023-03-28: qty 2

## 2023-03-28 MED ORDER — LORAZEPAM 1 MG PO TABS: 0.0000 mg | ORAL_TABLET | Freq: Two times a day (BID) | ORAL | Status: DC

## 2023-03-28 MED ORDER — TAMSULOSIN HCL 0.4 MG PO CAPS: 0.4000 mg | ORAL_CAPSULE | Freq: Every day | ORAL | Status: DC

## 2023-03-28 MED ORDER — ENOXAPARIN SODIUM 40 MG/0.4ML IJ SOSY
40.0000 mg | PREFILLED_SYRINGE | INTRAMUSCULAR | Status: DC
  Administered 2023-03-28: 40 mg via SUBCUTANEOUS
  Filled 2023-03-28: qty 0.4

## 2023-03-28 MED ORDER — THIAMINE MONONITRATE 100 MG PO TABS
100.0000 mg | ORAL_TABLET | Freq: Every day | ORAL | Status: DC
  Administered 2023-03-28 – 2023-03-30 (×3): 100 mg via ORAL
  Filled 2023-03-28 (×3): qty 1

## 2023-03-28 MED ORDER — LORAZEPAM 2 MG/ML IJ SOLN
1.0000 mg | INTRAMUSCULAR | Status: DC | PRN
  Administered 2023-03-29: 4 mg via INTRAVENOUS
  Filled 2023-03-28: qty 2

## 2023-03-28 MED ORDER — ACETAMINOPHEN 650 MG RE SUPP: 650.0000 mg | Freq: Four times a day (QID) | RECTAL | Status: DC | PRN

## 2023-03-28 MED ORDER — THIAMINE MONONITRATE 100 MG PO TABS
100.0000 mg | ORAL_TABLET | Freq: Every day | ORAL | Status: DC
  Administered 2023-03-28: 100 mg via ORAL
  Filled 2023-03-28: qty 1

## 2023-03-28 MED ORDER — ACETAMINOPHEN 500 MG PO TABS: 500.0000 mg | ORAL_TABLET | Freq: Four times a day (QID) | ORAL | Status: DC | PRN

## 2023-03-28 MED ORDER — LORAZEPAM 1 MG PO TABS
0.0000 mg | ORAL_TABLET | ORAL | Status: DC
  Administered 2023-03-28 – 2023-03-29 (×4): 4 mg via ORAL
  Filled 2023-03-28 (×4): qty 4

## 2023-03-28 MED ORDER — LORAZEPAM 1 MG PO TABS
1.0000 mg | ORAL_TABLET | ORAL | Status: DC | PRN
  Administered 2023-03-29 – 2023-03-30 (×2): 1 mg via ORAL
  Filled 2023-03-28 (×2): qty 1

## 2023-03-28 MED ORDER — LORAZEPAM 2 MG/ML IJ SOLN
2.0000 mg | Freq: Four times a day (QID) | INTRAMUSCULAR | Status: DC | PRN
  Administered 2023-03-28: 2 mg via INTRAVENOUS
  Filled 2023-03-28: qty 1

## 2023-03-28 MED ORDER — PROCHLORPERAZINE EDISYLATE 10 MG/2ML IJ SOLN: 5.0000 mg | INTRAMUSCULAR | Status: DC | PRN

## 2023-03-28 MED ORDER — LORAZEPAM 2 MG/ML IJ SOLN
2.0000 mg | Freq: Once | INTRAMUSCULAR | Status: AC
  Administered 2023-03-28: 2 mg via INTRAVENOUS
  Filled 2023-03-28: qty 1

## 2023-03-28 MED ORDER — ADULT MULTIVITAMIN W/MINERALS CH
1.0000 | ORAL_TABLET | Freq: Every day | ORAL | Status: DC
  Administered 2023-03-28 – 2023-03-29 (×2): 1 via ORAL
  Filled 2023-03-28 (×2): qty 1

## 2023-03-28 MED ORDER — THIAMINE MONONITRATE 100 MG PO TABS: 100.0000 mg | ORAL_TABLET | Freq: Every day | ORAL | Status: DC

## 2023-03-28 MED ORDER — LORAZEPAM 1 MG PO TABS
0.0000 mg | ORAL_TABLET | Freq: Four times a day (QID) | ORAL | Status: DC
  Administered 2023-03-28: 1 mg via ORAL
  Administered 2023-03-28: 4 mg via ORAL
  Filled 2023-03-28: qty 4
  Filled 2023-03-28: qty 1

## 2023-03-28 MED ORDER — SODIUM CHLORIDE 0.9% FLUSH
3.0000 mL | Freq: Two times a day (BID) | INTRAVENOUS | Status: DC
  Administered 2023-03-28 – 2023-03-30 (×3): 3 mL via INTRAVENOUS

## 2023-03-28 MED ORDER — MELATONIN 3 MG PO TABS: 3.0000 mg | ORAL_TABLET | Freq: Every evening | ORAL | Status: DC | PRN

## 2023-03-28 MED ORDER — FOLIC ACID 1 MG PO TABS
1.0000 mg | ORAL_TABLET | Freq: Every day | ORAL | Status: DC
  Administered 2023-03-28 – 2023-03-30 (×3): 1 mg via ORAL
  Filled 2023-03-28 (×3): qty 1

## 2023-03-28 MED ORDER — ACETAMINOPHEN 325 MG PO TABS: 650.0000 mg | ORAL_TABLET | Freq: Four times a day (QID) | ORAL | Status: DC | PRN

## 2023-03-28 MED ORDER — MELATONIN 5 MG PO TABS: 5.0000 mg | ORAL_TABLET | Freq: Every evening | ORAL | Status: DC | PRN

## 2023-03-28 MED ORDER — LORAZEPAM 1 MG PO TABS: 0.0000 mg | ORAL_TABLET | Freq: Three times a day (TID) | ORAL | Status: DC

## 2023-03-28 MED ORDER — PROCHLORPERAZINE EDISYLATE 10 MG/2ML IJ SOLN: 5.0000 mg | Freq: Four times a day (QID) | INTRAMUSCULAR | Status: DC | PRN

## 2023-03-28 NOTE — Progress Notes (Signed)
Patient refused lovenox. States that it has made him break out in  rash around insertion site. I put it on his allergy list. MD, Opyd, made aware.

## 2023-03-28 NOTE — Plan of Care (Signed)
  Problem: Education: Goal: Knowledge of General Education information will improve Description Including pain rating scale, medication(s)/side effects and non-pharmacologic comfort measures Outcome: Progressing   Problem: Health Behavior/Discharge Planning: Goal: Ability to manage health-related needs will improve Outcome: Progressing   

## 2023-03-28 NOTE — ED Notes (Signed)
Patient transported to scans ? ?

## 2023-03-28 NOTE — Consult Note (Addendum)
Neurology Consultation Reason for Consult: Concern for seizure Requesting Physician: Dow Adolph  CC: Desire to quit drinking  History is obtained from: Patient and chart review  HPI: Adam Helfman Ferre Montez Hageman. is a 37 y.o. 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  He did not have any further seizure activity, did report "significant visual and auditory hallucinations" without other objective signs of withdrawal such as tachycardia, confusion, agitation or hypertension.  Withdrawal was managed with CIWA protocol Ativan, and brief use of gabapentin, clonidine.  He requested discharge upon arrival to the floor and was felt stable for discharge on 12/29.  No prior history of seizures reported at the time  Today he presented after syncopal event at home when per EMS he fell down 8 stairs but he is amnestic to the fall.  He has noted to be having auditory and visual hallucinations on ED arrival.  Reportedly stopped drinking 2 days ago but presents with a blood alcohol level of over 300.  Self-reports seizures preceded by a "aura of blurred vision and hearing something which she cannot identify" to ED provider with fecal incontinence.  Reportedly had another seizure event while in x-ray.  He has received 10 mg of Ativan prior to my evaluation (4 mg at 03:29 2 mg at 04:32, 4 mg at 04:33), subsequently requiring oxygen due to desaturation when asleep (saturation improves readily on awakening)  Neurology was asked to consult due to atypical description of aura prior to events.  To me he reports that he does not have any aura prior to his events.  He reports what he is hearing is constant ringing in his ears.  He reports that he is seeing generalized fogginess in his vision that comes and  goes without any particular part of his vision affected  ROS: Limited due to his sleepiness but he denies any other recent symptoms to me  Past Medical History:  Diagnosis Date   Anxiety    Arthritis    Phreesia 06/22/2020   Back pain    Chest pain    Chronic back pain    Constipation    Decreased libido    Depression    Fatty liver    Heartburn    High cholesterol    History of stomach ulcers    HTN (hypertension)    Hyperlipidemia    Phreesia 10/26/2020   Joint pain    Psoriasis    Substance abuse (HCC)    Past Surgical History:  Procedure Laterality Date   APPENDECTOMY     TONSILLECTOMY AND ADENOIDECTOMY     Current Outpatient Medications  Medication Instructions   acetaminophen (TYLENOL) 500 mg, Oral, Every 6 hours PRN   predniSONE (DELTASONE) 40 mg, Oral, Daily   sildenafil (VIAGRA) 50 mg, Oral, Daily PRN   tamsulosin (FLOMAX) 0.4 mg, Oral, Daily   Testosterone 20.25 MG/ACT (1.62%) GEL 1 pump applied to each shoulder daily     Family History  Problem Relation Age of Onset   Lung cancer Mother    Anxiety disorder Mother    Diabetes Father    Heart disease Father    Hypertension Father    Hyperlipidemia Father    Sudden death Father    Obesity Father    Eating disorder Father    Skin cancer Paternal Grandmother  Lung cancer Maternal Aunt    Lung cancer Maternal Aunt    Lung cancer Maternal Aunt    Lung cancer Paternal Uncle    Lung cancer Paternal Aunt    Cancer Neg Hx    Social History:  reports that he quit smoking about 5 years ago. His smoking use included cigars. He has been exposed to tobacco smoke. His smokeless tobacco use includes chew. He reports current alcohol use of about 2.0 standard drinks of alcohol per week. He reports that he does not use drugs.   Exam: Current vital signs: BP 104/62   Pulse (!) 101   Temp 98.7 F (37.1 C) (Oral)   Resp 18   Ht 5\' 7"  (1.702 m)   Wt 72.6 kg   SpO2 96%   BMI 25.06 kg/m  Vital signs in  last 24 hours: Temp:  [98.7 F (37.1 C)] 98.7 F (37.1 C) (05/06 0258) Pulse Rate:  [86-105] 101 (05/06 0512) Resp:  [17-22] 18 (05/06 0512) BP: (104-143)/(62-94) 104/62 (05/06 0500) SpO2:  [85 %-100 %] 96 % (05/06 0512) Weight:  [72.6 kg] 72.6 kg (05/06 0259)   Physical Exam  Constitutional: Appears well-developed and well-nourished.  Psych: Affect slightly irritable when first awoken but then cooperative and pleasant Eyes: No scleral injection HENT: No oropharyngeal obstruction.  MSK: no major joint deformities.  Cardiovascular: Tachycardic to the 140s on awakening (90s when asleep) Respiratory: Effort normal, non-labored breathing GI: Soft.  No distension. There is no tenderness.  Skin: Warm dry and intact visible skin  Neuro: Mental Status: Patient is awake, alert, oriented to person, place, month, year, and situation. Patient gives vague details on history and is often tangential in his answers No signs of aphasia or neglect Cranial Nerves: II: Visual Fields are full. Pupils are equal, round, and reactive to light.   III,IV, VI: EOMI without ptosis or diploplia.  No significant nystagmus V: Facial sensation is symmetric to light touch VII: Facial movement is symmetric.  VIII: hearing is intact to voice X: Uvula elevates symmetrically XI: Shoulder shrug is symmetric. XII: tongue is midline without atrophy or fasciculations.  Motor: Tone is normal. Bulk is normal. 5/5 strength was present in all four extremities.  Sensory: Sensation is symmetric to light touch and temperature in the arms and legs. Deep Tendon Reflexes: 2+ and symmetric in the brachioradialis and diminished in the patellae.  Cerebellar: FNF and HKS are intact bilaterally Gait:  Deferred in acute setting    I have reviewed labs in epic and the results pertinent to this consultation are:  Basic Metabolic Panel: Recent Labs  Lab 03/28/23 0354  NA 137  K 3.6  CL 99  CO2 22  GLUCOSE 98  BUN 11   CREATININE 1.19  CALCIUM 8.9  MG 1.7   AST elevated at 165, ALT 172  CBC: Recent Labs  Lab 03/28/23 0354  WBC 5.3  NEUTROABS 3.1  HGB 13.0  HCT 37.3*  MCV 95.9  PLT 144*   (Recently 134 on discharge from [facility] 11/19/2022)  Coagulation Studies: No results for input(s): "LABPROT", "INR" in the last 72 hours.   Blood alcohol level 300 on arrival,  200 on 11/18/20[facility] at [facility]  Lab Results  Component Value Date   VITAMINB12 673 03/12/2020    I have reviewed the images obtained:  Head CT personally reviewed, agree with radiology no acute intracranial process, but greater than expected atrophy for age   Impression: This is a  37 year old presenting with concern for seizure activity in the setting of acute alcohol intoxication.  While typically seizures are seen in the setting of alcohol withdrawal, acute intoxication has also been demonstrated to lower seizure threshold in some individuals.  His very high blood alcohol level with retained consciousness is indicative of chronic heavy alcohol use, as is his ability to wake up but still demonstrating persistent tachycardia, tremor, and mild diaphoresis after having received 10 mg total of Ativan in the few hours prior to my evaluation.  Certainly agree with admission for alcohol withdrawal management and consideration of discharge to inpatient rehab facility which patient reports he would desire.  Of note, the seizure event described in x-ray sounds atypical for seizure given that the patient had no postictal state.  If he continues to have frequent seizure-like activity, LTM EEG for spell capture may be indicated, but at this time will order only routine EEG  At this time without any clear focality to his seizure events as described and the fact that have occurred only in the setting of acute alcohol intoxication and/for alcohol withdrawal, antiseizure medications would not be indicated.  Treatment is  primarily rehabilitation from alcohol use disorder, and management of his withdrawal with benzodiazepines and/or phenobarbital per primary team preference  Recommendations: -Thiamine level ordered prior to repletion for diagnostic clarity -Appreciate alcohol withdrawal management per primary team -MRI brain with and without contrast for seizure workup -Routine EEG for seizure workup; if patient has further spells may consider LTM EEG -Neurology will follow-up MRI brain and EEG but otherwise will be available as needed going forward, please reach out if new questions or concerns arise  Brooke Dare MD-PhD [facility] [phone] Available 7 PM to 7 AM, outside of these hours please call Neurologist on call as listed on Amion.  Addendum, due to incorrect registration or identity theft demographic information was changed by identity application analyst and I was asked to review the note for accuracy and sign.  On brief review I do not feel that there is any significant change to clinical management

## 2023-03-28 NOTE — ED Notes (Signed)
This RN called by x-ray stating patient had seizure. This RN went down to x-ray and seizure had resolved. Patient alert and talking.

## 2023-03-28 NOTE — ED Notes (Addendum)
ED TO INPATIENT HANDOFF REPORT  ED Nurse Name and Phone #: Mary, RN, [phone]  S Name/Age/Gender Adam Dean Blankley Jr. 36 y.o. male Room/Bed: 007C/007C  Code Status   Code Status: Full Code  Home/SNF/Other Home Patient oriented to: self, place, time, and situation Is this baseline? Yes   Triage Complete: Triage complete  Chief Complaint Seizure (HCC) [R56.9]  Triage Note Patient BIB EMS from home due to fall. EMS reports patient had syncopal episode and fell down 8 stairs. Patient does not remember fall. Patient alcoholic, last drink yesterday morning. Patient states he has been trying to withdraw himself. Patient currently having visual and auditory hallucinations. Patient A&Ox4.   Allergies Allergies  Allergen Reactions   Alka-Seltzer [Aspirin Effervescent] Anaphylaxis    Anaphyalxis to Alzaseltzer Plus (diffuse hives and hypotension, needing epinephrine)   Wasp Venom Protein Other (See Comments)   Yellow Jacket Venom Other (See Comments)   Wasp Venom Itching and Rash    Level of Care/Admitting Diagnosis ED Disposition     ED Disposition  Admit   Condition  --   Comment  Hospital Area: [facility] [[id number]]  Level of Care: Progressive [102]  Admit to Progressive based on following criteria: ACUTE MENTAL DISORDER-RELATED Drug/Alcohol Ingestion/Overdose/Withdrawal, Suicidal Ideation/attempt requiring safety sitter and < Q2h monitoring/assessments, moderate to severe agitation that is managed with medication/sitter, CIWA-Ar score < 20.  May admit patient to [place] or [place] if equivalent level of care is available:: No  Covid Evaluation: Asymptomatic - no recent exposure (last 10 days) testing not required  Diagnosis: Seizure (HCC) [205090]  Admitting Physician: HALL, CAROLE N [[id number]]  Attending Physician: HALL, CAROLE N [[id number]]  Certification:: I certify this patient will need inpatient services for at least 2 midnights  Estimated  Length of Stay: 2          B Medical/Surgery History Past Medical History:  Diagnosis Date   Anxiety    Arthritis    [employer] 06/22/2020   Back pain    Chest pain    Chronic back pain    Constipation    Decreased libido    Depression    Fatty liver    Heartburn    High cholesterol    History of stomach ulcers    HTN (hypertension)    Hyperlipidemia    [employer] 10/26/2020   Joint pain    Psoriasis    Substance abuse (HCC)    Past Surgical History:  Procedure Laterality Date   APPENDECTOMY     TONSILLECTOMY AND ADENOIDECTOMY       A IV Location/Drains/Wounds Patient Lines/Drains/Airways Status     Active Line/Drains/Airways     Name Placement date Placement time Site Days   Peripheral IV 03/28/23 20 G Anterior;Left Forearm 03/28/23  --  Forearm  less than 1            Intake/Output Last 24 hours  Intake/Output Summary (Last 24 hours) at 03/28/2023 1613 Last data filed at 03/28/2023 0456 Gross per 24 hour  Intake 1000 ml  Output --  Net 1000 ml    Labs/Imaging Results for orders placed or performed during the hospital encounter of 03/28/23 (from the past 48 hour(s))  CBG monitoring, ED     Status: None   Collection Time: 03/28/23  2:56 AM  Result Value Ref Range   Glucose-Capillary 80 70 - 99 mg/dL    Comment: Glucose reference range applies only to samples taken after fasting for at least 8 hours.    Comprehensive metabolic panel     Status: Abnormal   Collection Time: 03/28/23  3:54 AM  Result Value Ref Range   Sodium 137 135 - 145 mmol/L   Potassium 3.6 3.5 - 5.1 mmol/L   Chloride 99 98 - 111 mmol/L   CO2 22 22 - 32 mmol/L   Glucose, Bld 98 70 - 99 mg/dL    Comment: Glucose reference range applies only to samples taken after fasting for at least 8 hours.   BUN 11 6 - 20 mg/dL   Creatinine, Ser 1.19 0.61 - 1.24 mg/dL   Calcium 8.9 8.9 - 10.3 mg/dL   Total Protein 6.7 6.5 - 8.1 g/dL   Albumin 4.4 3.5 - 5.0 g/dL   AST 165 (H) 15 - 41 U/L    ALT 172 (H) 0 - 44 U/L   Alkaline Phosphatase 56 38 - 126 U/L   Total Bilirubin 0.8 0.3 - 1.2 mg/dL   GFR, Estimated >60 >60 mL/min    Comment: (NOTE) Calculated using the CKD-EPI Creatinine Equation (2021)    Anion gap 16 (H) 5 - 15    Comment: Performed at [facility], [address] [place], [place] [address]  Ethanol     Status: Abnormal   Collection Time: 03/28/23  3:54 AM  Result Value Ref Range   Alcohol, Ethyl (B) 300 (H) <10 mg/dL    Comment: (NOTE) Lowest detectable limit for serum alcohol is 10 mg/dL.  For medical purposes only. Performed at [facility], [address] [place], [place] [address]   CBC with Differential     Status: Abnormal   Collection Time: 03/28/23  3:54 AM  Result Value Ref Range   WBC 5.3 4.0 - 10.5 K/uL   RBC 3.89 (L) 4.22 - 5.81 MIL/uL   Hemoglobin 13.0 13.0 - 17.0 g/dL   HCT 37.3 (L) 39.0 - 52.0 %   MCV 95.9 80.0 - 100.0 fL   MCH 33.4 26.0 - 34.0 pg   MCHC 34.9 30.0 - 36.0 g/dL   RDW 11.6 11.5 - 15.5 %   Platelets 144 (L) 150 - 400 K/uL   nRBC 0.0 0.0 - 0.2 %   Neutrophils Relative % 60 %   Neutro Abs 3.1 1.7 - 7.7 K/uL   Lymphocytes Relative 33 %   Lymphs Abs 1.7 0.7 - 4.0 K/uL   Monocytes Relative 6 %   Monocytes Absolute 0.3 0.1 - 1.0 K/uL   Eosinophils Relative 0 %   Eosinophils Absolute 0.0 0.0 - 0.5 K/uL   Basophils Relative 1 %   Basophils Absolute 0.1 0.0 - 0.1 K/uL   Immature Granulocytes 0 %   Abs Immature Granulocytes 0.01 0.00 - 0.07 K/uL    Comment: Performed at [facility], [address] [place], [place] [address]  Magnesium     Status: None   Collection Time: 03/28/23  3:54 AM  Result Value Ref Range   Magnesium 1.7 1.7 - 2.4 mg/dL    Comment: Performed at [facility], [address] [place], [place] [address]  Hepatitis panel, acute     Status: None   Collection Time: 03/28/23  6:43 AM  Result Value Ref Range   Hepatitis B Surface Ag NON REACTIVE NON REACTIVE   HCV Ab NON  REACTIVE NON REACTIVE    Comment: (NOTE) Nonreactive HCV antibody screen is consistent with no HCV infections,  unless recent infection is suspected or other evidence exists to indicate   HCV infection.     Hep A IgM NON REACTIVE NON REACTIVE   Hep B C IgM NON REACTIVE NON REACTIVE    Comment: Performed at [facility], [address] [place], [place] [address]  HIV Antibody (routine testing w rflx)     Status: None   Collection Time: 03/28/23  6:43 AM  Result Value Ref Range   HIV Screen 4th Generation wRfx Non Reactive Non Reactive    Comment: Performed at [facility], [address] [place], [place] [address]  Vitamin B12     Status: None   Collection Time: 03/28/23  7:00 AM  Result Value Ref Range   Vitamin B-12 448 180 - 914 pg/mL    Comment: (NOTE) This assay is not validated for testing neonatal or myeloproliferative syndrome specimens for Vitamin B12 levels. Performed at [facility], [address] [place], [place] [address]   Urine rapid drug screen (hosp performed)     Status: Abnormal   Collection Time: 03/28/23  8:45 AM  Result Value Ref Range   Opiates NONE DETECTED NONE DETECTED   Cocaine NONE DETECTED NONE DETECTED   Benzodiazepines POSITIVE (A) NONE DETECTED   Amphetamines NONE DETECTED NONE DETECTED   Tetrahydrocannabinol NONE DETECTED NONE DETECTED   Barbiturates NONE DETECTED NONE DETECTED    Comment: (NOTE) DRUG SCREEN FOR MEDICAL PURPOSES ONLY.  IF CONFIRMATION IS NEEDED FOR ANY PURPOSE, NOTIFY LAB WITHIN 5 DAYS.  LOWEST DETECTABLE LIMITS FOR URINE DRUG SCREEN Drug Class                     Cutoff (ng/mL) Amphetamine and metabolites    1000 Barbiturate and metabolites    200 Benzodiazepine                 200 Opiates and metabolites        300 Cocaine and metabolites        300 THC                            50 Performed at [facility], [address] [place], [place] [address]   Urinalysis, Routine w reflex  microscopic -Urine, Clean Catch     Status: Abnormal   Collection Time: 03/28/23  8:45 AM  Result Value Ref Range   Color, Urine YELLOW YELLOW   APPearance CLEAR CLEAR   Specific Gravity, Urine 1.014 1.005 - 1.030   pH 6.0 5.0 - 8.0   Glucose, UA NEGATIVE NEGATIVE mg/dL   Hgb urine dipstick NEGATIVE NEGATIVE   Bilirubin Urine NEGATIVE NEGATIVE   Ketones, ur 5 (A) NEGATIVE mg/dL   Protein, ur NEGATIVE NEGATIVE mg/dL   Nitrite NEGATIVE NEGATIVE   Leukocytes,Ua NEGATIVE NEGATIVE    Comment: Performed at [facility], [address] [place], [place] [address]   EEG adult  Result Date: 03/28/2023 Yadav, Priyanka O, MD     03/28/2023 11:59 AM Patient Name: Adam [name], Adam Dean Jr. MRN: [id number] Epilepsy Attending: Priyanka O Yadav Referring Physician/Provider: Hall, Carole N, DO Date: 03/28/2023 Duration: 22.36 mins Patient history: 36-year-old presenting with concern for seizure activity in the setting of acute alcohol intoxication. Level of alertness: Awake, asleep AEDs during EEG study: Ativan Technical aspects: This EEG study was done with scalp electrodes positioned according to the 10-20 International system of electrode placement. Electrical activity was reviewed with band pass filter of 1-70Hz, sensitivity of   7 uV/mm, display speed of 30mm/sec with a 60Hz notched filter applied as appropriate. EEG data were recorded continuously and digitally stored.  Video monitoring was available and reviewed as appropriate. Description: The posterior dominant rhythm consists of 8 Hz activity of moderate voltage (25-35 uV) seen predominantly in posterior head regions, symmetric and reactive to eye opening and eye closing. Sleep was characterized by vertex waves, sleep spindles (12 to 14 Hz), maximal frontocentral region. Hyperventilation and photic stimulation were not performed.   IMPRESSION: This study is within normal limits. No seizures or epileptiform discharges were seen throughout the recording. A  normal interictal EEG does not exclude the diagnosis of epilepsy. Priyanka O Yadav   US Abdomen Limited RUQ (LIVER/GB)  Result Date: 03/28/2023 CLINICAL DATA:  221910 Elevated LFTs 221910 EXAM: ULTRASOUND ABDOMEN LIMITED RIGHT UPPER QUADRANT COMPARISON:  None Available. FINDINGS: Gallbladder: No gallstones or wall thickening visualized. No sonographic Murphy sign noted by sonographer. Common bile duct: Diameter: Visualized portion measures 6 mm, within normal limits. Liver: No focal lesion identified. Increased hepatic parenchymal echogenicity with poor acoustic penetrance. Portal vein is patent on color Doppler imaging with normal direction of blood flow towards the liver. Other: None. IMPRESSION: Hepatic steatosis. Electronically Signed   By: Stephanie  Peacock M.D.   On: 03/28/2023 07:53   MR BRAIN WO CONTRAST  Result Date: 03/28/2023 CLINICAL DATA:  36-year-old male status post syncope, fall down stairs. EXAM: MRI HEAD WITHOUT CONTRAST TECHNIQUE: Multiplanar, multiecho pulse sequences of the brain and surrounding structures were obtained without intravenous contrast. COMPARISON:  Plain head CT 0403 hours today. FINDINGS: Brain: Susceptibility artifact over the anterior vertex is of unclear etiology, not correlated on the earlier CT. DWI and SWI most affected by the susceptibility artifact. Allowing for this no restricted diffusion to suggest acute infarction. No midline shift, mass effect, evidence of mass lesion, ventriculomegaly, extra-axial collection or acute intracranial hemorrhage. Cervicomedullary junction and pituitary are within normal limits. Stable cerebral volume to that described earlier. Gray and white matter signal are within normal limits. No definite chronic cerebral blood products. No encephalomalacia identified. Vascular: Major intracranial vascular flow voids are preserved. Skull and upper cervical spine: Negative visible cervical spine. Visualized bone marrow signal is within normal  limits. Sinuses/Orbits: Negative. Other: Visible internal auditory structures appear normal. Negative visible face. IMPRESSION: 1. Scalp vertex Susceptibility artifact of unclear etiology. 2. No acute intracranial abnormality. Negative noncontrast MRI appearance of the brain aside from volume loss greater than expected for age. Electronically Signed   By: H  Hall M.D.   On: 03/28/2023 07:07   CT Head Wo Contrast  Result Date: 03/28/2023 CLINICAL DATA:  36-year-old male status post syncope, fall down stairs. EXAM: CT HEAD WITHOUT CONTRAST TECHNIQUE: Contiguous axial images were obtained from the base of the skull through the vertex without intravenous contrast. RADIATION DOSE REDUCTION: This exam was performed according to the departmental dose-optimization program which includes automated exposure control, adjustment of the mA and/or kV according to patient size and/or use of iterative reconstruction technique. COMPARISON:  None Available. FINDINGS: Brain: No midline shift, ventriculomegaly, mass effect, evidence of mass lesion, intracranial hemorrhage or evidence of cortically based acute infarction. Gray-white matter differentiation is within normal limits throughout the brain. But generalized decreased cerebral volume over that expected for age. Vascular: No suspicious intracranial vascular hyperdensity. Skull: Scaphocephaly, normal variant.  No fracture identified. Sinuses/Orbits: Visualized paranasal sinuses and mastoids are clear. Other: No orbit or scalp soft tissue injury identified. IMPRESSION: 1. No acute intracranial abnormality   or acute traumatic injury identified. 2. Generalized brain volume loss over that expected for age, Cerebral Atrophy (ICD10-G31.9). Electronically Signed   By: H  Hall M.D.   On: 03/28/2023 04:15   DG Chest 2 View  Result Date: 03/28/2023 CLINICAL DATA:  36-year-old male status post syncope, fall down stairs. EXAM: CHEST - 2 VIEW COMPARISON:  Chest radiographs 09/14/2015 and  earlier. FINDINGS: PA and lateral views at 0335 hours. Normal lung volumes and mediastinal contours. Visualized tracheal air column is within normal limits. Both lungs are clear. No pneumothorax or pleural effusion. No acute osseous abnormality identified. Negative visible bowel gas. IMPRESSION: No acute cardiopulmonary abnormality or acute traumatic injury identified. Electronically Signed   By: H  Hall M.D.   On: 03/28/2023 04:04    Pending Labs Unresulted Labs (From admission, onward)     Start     Ordered   04/04/23 0500  Creatinine, serum  (enoxaparin (LOVENOX)    CrCl >/= 30 ml/min)  Weekly,   R     Comments: while on enoxaparin therapy    03/28/23 0642   03/29/23 0500  CBC  Tomorrow morning,   R        03/28/23 0927   03/29/23 0500  Comprehensive metabolic panel  Tomorrow morning,   R        03/28/23 0927   03/29/23 0500  Protime-INR  Tomorrow morning,   R        03/28/23 0927   03/28/23 0632  Vitamin B1  ONCE - URGENT,   URGENT        03/28/23 0631            Vitals/Pain Today's Vitals   03/28/23 1441 03/28/23 1500 03/28/23 1513 03/28/23 1530  BP:  104/68 104/68 101/69  Pulse:  74 74 72  Resp:  17  14  Temp: 98.2 F (36.8 C)     TempSrc:      SpO2:  93%  94%  Weight:      Height:      PainSc:        Isolation Precautions No active isolations  Medications Medications  LORazepam (ATIVAN) tablet 2 mg ( Oral See Alternative 03/28/23 1518)    Or  LORazepam (ATIVAN) tablet 0-4 mg (1 mg Oral Given 03/28/23 1518)  LORazepam (ATIVAN) tablet 2 mg (has no administration in time range)    Or  LORazepam (ATIVAN) tablet 0-4 mg (has no administration in time range)  LORazepam (ATIVAN) injection 2 mg (2 mg Intravenous Given 03/28/23 1235)  prochlorperazine (COMPAZINE) injection 5 mg (has no administration in time range)  polyethylene glycol (MIRALAX / GLYCOLAX) packet 17 g (has no administration in time range)  thiamine (VITAMIN B1) tablet 100 mg (100 mg Oral Given 03/28/23  1235)    Or  thiamine (VITAMIN B1) injection 100 mg ( Intravenous See Alternative 03/28/23 1235)  acetaminophen (TYLENOL) tablet 500 mg (has no administration in time range)  melatonin tablet 3 mg (has no administration in time range)  enoxaparin (LOVENOX) injection 40 mg (40 mg Subcutaneous Given 03/28/23 1108)  sodium chloride 0.9 % bolus 1,000 mL (0 mLs Intravenous Stopped 03/28/23 0456)  ondansetron (ZOFRAN) injection 4 mg (4 mg Intravenous Given 03/28/23 0433)  LORazepam (ATIVAN) tablet 2 mg (2 mg Oral Given 03/28/23 0432)    Mobility walks with person assist     Focused Assessments Neuro Assessment Handoff:  Swallow screen pass? Yes  Cardiac Rhythm: Normal sinus rhythm       Neuro   Assessment:   Neuro Checks:      Has TPA been given? No If patient is a Neuro Trauma and patient is going to OR before floor call report to 4N Charge nurse: [phone] or [phone]   R Recommendations: See Admitting Provider Note  Report given to:   Additional Notes: pt is AAOx4. Pt is on room air. Pt is using urinal.     

## 2023-03-28 NOTE — ED Notes (Signed)
Called 3W and notified 3W that pt will be coming upstairs.

## 2023-03-28 NOTE — ED Notes (Signed)
Patient transported to MRI 

## 2023-03-28 NOTE — ED Notes (Signed)
Patient returned from MRI patient is alert oriented, denies pain states he just wants to rest.

## 2023-03-28 NOTE — Progress Notes (Signed)
Interim progress note not for billing  I have reviewed chart, examined patient. I agree with assessment and plan in h and p. Patient sleeping, is not showing severe symptoms of withdrawal. Mri nothing acute. U/s with steatotic liver. Several labs pending. Continuing ciwa ativan. Eeg pending. AM labs ordered including INR.

## 2023-03-28 NOTE — Procedures (Addendum)
  Patient Name: Adam [name]. Adam [name]. MRN: [id number] Epilepsy Attending: Charlsie Quest  Referring Physician/Provider: Darlin Drop, DO  Date: 03/28/2023 Duration: 22.36 mins   Patient history: 37 year old presenting with concern for seizure activity in the setting of acute alcohol intoxication.    Level of alertness: Awake, asleep   AEDs during EEG study: Ativan   Technical aspects: This EEG study was done with scalp electrodes positioned according to the 10-20 International system of electrode placement. Electrical activity was reviewed with band pass filter of 1-70Hz , sensitivity of 7 uV/mm, display speed of 43mm/sec with a 60Hz  notched filter applied as appropriate. EEG data were recorded continuously and digitally stored.  Video monitoring was available and reviewed as appropriate.   Description: The posterior dominant rhythm consists of 8 Hz activity of moderate voltage (25-35 uV) seen predominantly in posterior head regions, symmetric and reactive to eye opening and eye closing. Sleep was characterized by vertex waves, sleep spindles (12 to 14 Hz), maximal frontocentral region. Hyperventilation and photic stimulation were not performed.      IMPRESSION: This study is within normal limits. No seizures or epileptiform discharges were seen throughout the recording.   A normal interictal EEG does not exclude the diagnosis of epilepsy.   Harel Repetto Annabelle Harman

## 2023-03-28 NOTE — Progress Notes (Signed)
EEG complete - results pending 

## 2023-03-28 NOTE — ED Triage Notes (Signed)
Patient BIB EMS from home due to fall. EMS reports patient had syncopal episode and fell down 8 stairs. Patient does not remember fall. Patient alcoholic, last drink yesterday morning. Patient states he has been trying to withdraw himself. Patient currently having visual and auditory hallucinations. Patient A&Ox4.

## 2023-03-28 NOTE — ED Notes (Addendum)
Pt was 87% on RA.  RN found pt sleeping but easily woken up.  RN placed 2L [place] on pt and his O2 returned to 95%.  Provider and primary RN notified.

## 2023-03-28 NOTE — H&P (Addendum)
History and Physical  Nagee Goates [name]. ZOX:[id number] DOB: September 15, 1986 DOA: 03/28/2023  Referring physician: Dr. Preston Fleeting, EDP  PCP: Rema Fendt, NP  Outpatient Specialists: Urology. Patient coming from: Home where he lives with his wife  Chief Complaint: Breakthrough seizures and alcohol intoxication.  HPI: Adam [name]. is a 37 y.o. male with medical history significant for alcohol abuse, hypertension, first episode of seizure in Dec 2023, for which he was admitted on 11/18/2022 at [facility] and discharged on 11/19/2022, who presents to [facility] ED from home due to a fall.  Per EMS, the patient had a syncopal episode and fell down 8 stairs.  The patient was intoxicated and does not remember the fall.  Endorses visual and auditory hallucinations.  In the ED, alcohol level 300, the patient had a seizure activity witnessed by staff.  CIWA protocol initiated.  Due to his atypical seizure presentation an MRI brain without contrast was ordered to rule out other causes of seizures.  Admitted by [facility], hospitalist service, to progressive care unit as inpatient status.  EDP consulted neurology to assist with the management.  ED Course: Tmax 98.7.  BP 132/82, pulse 86, respiratory 22, saturation 97% on room air.  Lab studies remarkable for elevated LFTs AST 1 65, ALT 172, platelet count 144.  Review of Systems: Review of systems as noted in the HPI. All other systems reviewed and are negative.   Past Medical History:  Diagnosis Date   Anxiety    Arthritis    Phreesia 06/22/2020   Back pain    Chest pain    Chronic back pain    Constipation    Decreased libido    Depression    Fatty liver    Heartburn    High cholesterol    History of stomach ulcers    HTN (hypertension)    Hyperlipidemia    Phreesia 10/26/2020   Joint pain    Psoriasis    Substance abuse (HCC)    Past Surgical History:  Procedure Laterality Date   APPENDECTOMY     TONSILLECTOMY AND  ADENOIDECTOMY      Social History:  reports that he quit smoking about 5 years ago. His smoking use included cigars. He has been exposed to tobacco smoke. His smokeless tobacco use includes chew. He reports current alcohol use of about 2.0 standard drinks of alcohol per week. He reports that he does not use drugs.   Allergies  Allergen Reactions   Alka-Seltzer [Aspirin Effervescent] Anaphylaxis    Anaphyalxis to Alzaseltzer Plus (diffuse hives and hypotension, needing epinephrine)   Wasp Venom Protein Other (See Comments)   Other Hives, Itching, Rash and Swelling   Yellow Jacket Venom Other (See Comments)   Wasp Venom Itching and Rash    Family History  Problem Relation Age of Onset   Lung cancer Mother    Anxiety disorder Mother    Diabetes Father    Heart disease Father    Hypertension Father    Hyperlipidemia Father    Sudden death Father    Obesity Father    Eating disorder Father    Skin cancer Paternal Grandmother    Lung cancer Maternal Aunt    Lung cancer Maternal Aunt    Lung cancer Maternal Aunt    Lung cancer Paternal Uncle    Lung cancer Paternal Aunt    Cancer Neg Hx       Prior to Admission medications   Medication Sig Start Date End  Date Taking? Authorizing Provider  acetaminophen (TYLENOL) 500 MG tablet Take 500 mg by mouth every 6 (six) hours as needed.    [provider]  predniSONE (DELTASONE) 10 MG tablet Take 40 mg by mouth daily. 09/24/22   [provider]  sildenafil (VIAGRA) 50 MG tablet Take 1 tablet (50 mg total) by mouth daily as needed for erectile dysfunction. 07/06/22   Rema Fendt, NP  tamsulosin (FLOMAX) 0.4 MG CAPS capsule Take 1 capsule (0.4 mg total) by mouth daily. 10/08/22   Stoioff, Verna Czech, MD  Testosterone 20.25 MG/ACT (1.62%) GEL 1 pump applied to each shoulder daily 11/26/22   Riki Altes, MD    Physical Exam: BP (!) 143/94   Pulse (!) 101   Temp 98.7 F (37.1 C) (Oral)   Resp 18   Ht 5\' 7"  (1.702 m)    Wt 72.6 kg   SpO2 96%   BMI 25.06 kg/m   General: 37 y.o. year-old male well developed well nourished in no acute distress.  Somnolent but arouses to voices. Cardiovascular: Regular rate and rhythm with no rubs or gallops.  No thyromegaly or JVD noted.  No lower extremity edema. 2/4 pulses in all 4 extremities. Respiratory: Clear to auscultation with no wheezes or rales. Good inspiratory effort. Abdomen: Soft nontender nondistended with normal bowel sounds x4 quadrants. Muskuloskeletal: No cyanosis, clubbing or edema noted bilaterally Neuro: CN II-XII intact, strength, sensation, reflexes Skin: No ulcerative lesions noted or rashes Psychiatry: Unable to assess mood and judgment due to somnolence.         Labs on Admission:  Basic Metabolic Panel: Recent Labs  Lab 03/28/23 0354  NA 137  K 3.6  CL 99  CO2 22  GLUCOSE 98  BUN 11  CREATININE 1.19  CALCIUM 8.9  MG 1.7   Liver Function Tests: Recent Labs  Lab 03/28/23 0354  AST 165*  ALT 172*  ALKPHOS 56  BILITOT 0.8  PROT 6.7  ALBUMIN 4.4   No results for input(s): "LIPASE", "AMYLASE" in the last 168 hours. No results for input(s): "AMMONIA" in the last 168 hours. CBC: Recent Labs  Lab 03/28/23 0354  WBC 5.3  NEUTROABS 3.1  HGB 13.0  HCT 37.3*  MCV 95.9  PLT 144*   Cardiac Enzymes: No results for input(s): "CKTOTAL", "CKMB", "CKMBINDEX", "TROPONINI" in the last 168 hours.  BNP (last 3 results) No results for input(s): "BNP" in the last 8760 hours.  ProBNP (last 3 results) No results for input(s): "PROBNP" in the last 8760 hours.  CBG: Recent Labs  Lab 03/28/23 0256  GLUCAP 80    Radiological Exams on Admission: CT Head Wo Contrast  Result Date: 03/28/2023 CLINICAL DATA:  37 year old male status post syncope, fall down stairs. EXAM: CT HEAD WITHOUT CONTRAST TECHNIQUE: Contiguous axial images were obtained from the base of the skull through the vertex without intravenous contrast. RADIATION DOSE  REDUCTION: This exam was performed according to the departmental dose-optimization program which includes automated exposure control, adjustment of the mA and/or kV according to patient size and/or use of iterative reconstruction technique. COMPARISON:  None Available. FINDINGS: Brain: No midline shift, ventriculomegaly, mass effect, evidence of mass lesion, intracranial hemorrhage or evidence of cortically based acute infarction. Gray-white matter differentiation is within normal limits throughout the brain. But generalized decreased cerebral volume over that expected for age. Vascular: No suspicious intracranial vascular hyperdensity. Skull: Scaphocephaly, normal variant.  No fracture identified. Sinuses/Orbits: Visualized paranasal sinuses and mastoids are clear. Other: No  orbit or scalp soft tissue injury identified. IMPRESSION: 1. No acute intracranial abnormality or acute traumatic injury identified. 2. Generalized brain volume loss over that expected for age, Cerebral Atrophy (ICD10-G31.9). Electronically Signed   By: Odessa Fleming M.D.   On: 03/28/2023 04:15   DG Chest 2 View  Result Date: 03/28/2023 CLINICAL DATA:  37 year old male status post syncope, fall down stairs. EXAM: CHEST - 2 VIEW COMPARISON:  Chest radiographs 09/14/2015 and earlier. FINDINGS: PA and lateral views at 0335 hours. Normal lung volumes and mediastinal contours. Visualized tracheal air column is within normal limits. Both lungs are clear. No pneumothorax or pleural effusion. No acute osseous abnormality identified. Negative visible bowel gas. IMPRESSION: No acute cardiopulmonary abnormality or acute traumatic injury identified. Electronically Signed   By: Odessa Fleming M.D.   On: 03/28/2023 04:04    EKG: I independently viewed the EKG done and my findings are as followed: Sinus rhythm rate of 89.  Nonspecific ST-T changes.  QTc 448.  Assessment/Plan Present on Admission: **None**  Principal Problem:   Seizure (HCC)  Breakthrough  seizures, unclear etiology Noncontrast CT head nonacute Presented with alcohol level of 300 UDS is pending Seizure precautions, fall and aspiration precautions, are in place Follow EEG and noncontrast MRI brain. Neurology consulted by EDP  Acute metabolic encephalopathy, postictal Treat underlying conditions Reorient as needed Strict n.p.o. while hypersomnolent to avoid aspiration  Alcohol intoxication with concern for alcohol withdrawal CIWA protocol in place Continue multivitamins, thiamine, folic acid supplements TOC consulted to provide resources for alcohol cessation  Elevated liver chemistries Mild thrombocytopenia AST to ALT pattern does not meet typical 2:1 ratio to match his alcoholism Obtain hepatitis panel and abdominal ultrasound to rule out cirrhosis    DVT prophylaxis: Subcu Lovenox daily  Code Status: Full code  Family Communication: None at bedside.  Disposition Plan: Admitted to progressive care unit  Consults called: Neurology.  Admission status: Inpatient status.   Status is: Inpatient The patient requires at least 2 midnights for further evaluation and treatment of present condition.   Darlin Drop MD [facility] Pager [phone]  If 7PM-7AM, please contact night-coverage [email or web address] Password [email or web address]  03/28/2023, 5:14 AM

## 2023-03-28 NOTE — Progress Notes (Signed)
At the beginning of shift I was informed by the day RN, Selena Batten, that patient had been admitted under the wrong name. She had started the process of correcting the issue. With the help of IT, EPIC, CN, and the doctor I was able to get the patient admitted under his correct identification.

## 2023-03-28 NOTE — ED Provider Notes (Addendum)
[place] [facility] Provider Note   CSN: [id number] Arrival date & time: 03/28/23  0250     History  Chief Complaint  Patient presents with   Fall    Adam Dean Welby Jr. is a 37 y.o. male.  The history is provided by the patient.  Fall  He has history of hypertension, hyperlipidemia, stomach ulcers, substance abuse.  He is a heavy drinker who has tried to stop drinking.  He usually drinks 1/5 of liquor a day and decided 2 days ago to stop drinking.  Since then, when he starts to feel shaky, he takes "a few sips".  This morning, he had a seizure.  This was preceded by an aura of blurred vision and hearing something which she could not identify.  He was planning on waiting to come to the hospital, but had another episode where his vision got blurred and he was hearing something and he thinks he may have had a second seizure.  He does admit to fecal incontinence, denies urinary incontinence and bit lip or tongue.  He denies other drug use.  He is complaining of his head hurting and his chest hurting.   Home Medications Prior to Admission medications   Medication Sig Start Date End Date Taking? Authorizing Provider  acetaminophen (TYLENOL) 500 MG tablet Take 500 mg by mouth every 6 (six) hours as needed.    [provider]  predniSONE (DELTASONE) 10 MG tablet Take 40 mg by mouth daily. 09/24/22   [provider]  sildenafil (VIAGRA) 50 MG tablet Take 1 tablet (50 mg total) by mouth daily as needed for erectile dysfunction. 07/06/22   Stephens, Amy J, NP  tamsulosin (FLOMAX) 0.4 MG CAPS capsule Take 1 capsule (0.4 mg total) by mouth daily. 10/08/22   Stoioff, Scott C, MD  Testosterone 20.25 MG/ACT (1.62%) GEL 1 pump applied to each shoulder daily 11/26/22   Stoioff, Scott C, MD      Allergies    Alka-seltzer [aspirin effervescent], Wasp venom protein, Other, Yellow jacket venom, and Wasp venom    Review of Systems   Review of  Systems  All other systems reviewed and are negative.   Physical Exam Updated Vital Signs BP (!) 139/93   Pulse 90   Temp 98.7 F (37.1 C) (Oral)   Resp (!) 22   Ht 5' 7" (1.702 m)   Wt 72.6 kg   SpO2 97%   BMI 25.06 kg/m  Physical Exam Vitals and nursing note reviewed.   37 year old male, resting comfortably and in no acute distress. Vital signs are significant for mildly elevated blood pressure and slightly elevated heart rate. Oxygen saturation is 97%, which is normal. Head is normocephalic and atraumatic. PERRLA, EOMI. Oropharynx is clear. Neck is nontender and supple without adenopathy or JVD. Back is nontender and there is no CVA tenderness. Lungs are clear without rales, wheezes, or rhonchi. Chest is nontender. Heart has regular rate and rhythm without murmur. Abdomen is soft, flat, nontender. Extremities have no cyanosis or edema, full range of motion is present. Skin is warm and dry without rash. Neurologic: Awake and alert and oriented, speech is normal, cranial nerves are intact, moves all extremities equally.  He is moderately tremulous.  ED Results / Procedures / Treatments   Labs (all labs ordered are listed, but only abnormal results are displayed) Labs Reviewed  COMPREHENSIVE METABOLIC PANEL  ETHANOL  CBC WITH DIFFERENTIAL/PLATELET  RAPID URINE DRUG SCREEN, HOSP PERFORMED    MAGNESIUM  CBG MONITORING, ED    EKG None  Radiology No results found.  Procedures Procedures  Cardiac monitor shows normal sinus rhythm, per my interpretation.  Medications Ordered in ED Medications  sodium chloride 0.9 % bolus 1,000 mL (has no administration in time range)  LORazepam (ATIVAN) tablet 2 mg (has no administration in time range)    Or  LORazepam (ATIVAN) tablet 0-4 mg (has no administration in time range)  LORazepam (ATIVAN) tablet 2 mg (has no administration in time range)    Or  LORazepam (ATIVAN) tablet 0-4 mg (has no administration in time range)   thiamine (VITAMIN B1) tablet 100 mg (has no administration in time range)    Or  thiamine (VITAMIN B1) injection 100 mg (has no administration in time range)    ED Course/ Medical Decision Making/ A&P                             Medical Decision Making Amount and/or Complexity of Data Reviewed Labs: ordered. Radiology: ordered.  Risk OTC drugs. Prescription drug management. Decision regarding hospitalization.   Alcohol withdrawal with probable alcohol withdrawal seizures.  I have reviewed his past records, and he was admitted at [facility] on [facility]/18/[facility]22 with a similar presentation.  I have ordered IV fluids and lorazepam for alcohol withdrawal based on CIWA protocol.  I have ordered CT of head, x-rays of his chest, laboratory workup of CBC and comprehensive metabolic panel and ethanol level as well as magnesium level and drug screen.  I have reviewed and interpreted his laboratory test, and my interpretation is alcohol intoxication, elevated transaminases which are higher than they were on 11/19/2022, mild thrombocytopenia, normal magnesium level.  CT of head shows no acute intracranial abnormality.  Chest x-ray shows no acute cardiopulmonary abnormality or acute traumatic injury.  I am independently viewed all of the images, and agree with the radiologist's interpretation.  In spite of fairly high doses of lorazepam, patient continues to be very tremulous.  This seems unusual given how high his ethanol level is.  However, with having had 2 seizures secondary to what appears to be alcohol withdrawal, I feel that he will need hospital admission.  I have discussed the case with Dr. Hall of [facility], who agrees to admit the patient.  CRITICAL CARE Performed by: David Glick Total critical care time: 50 minutes Critical care time was exclusive of separately billable procedures and treating other patients. Critical care was necessary to treat or prevent  imminent or life-threatening deterioration. Critical care was time spent personally by me on the following activities: development of treatment plan with patient and/or surrogate as well as nursing, discussions with consultants, evaluation of patient's response to treatment, examination of patient, obtaining history from patient or surrogate, ordering and performing treatments and interventions, ordering and review of laboratory studies, ordering and review of radiographic studies, pulse oximetry and re-evaluation of patient's condition.  Final Clinical Impression(s) / ED Diagnoses Final diagnoses:  Alcohol withdrawal syndrome with complication (HCC)  Seizure (HCC)  Elevated transaminase level  Thrombocytopenia (HCC)    Rx / DC Orders ED Discharge Orders     None         Glick, David, MD 03/28/23 0511  

## 2023-03-29 DIAGNOSIS — F10939 Alcohol use, unspecified with withdrawal, unspecified: Secondary | ICD-10-CM

## 2023-03-29 DIAGNOSIS — G9340 Encephalopathy, unspecified: Secondary | ICD-10-CM

## 2023-03-29 LAB — COMPREHENSIVE METABOLIC PANEL
ALT: 128 U/L — ABNORMAL HIGH (ref 0–44)
AST: 98 U/L — ABNORMAL HIGH (ref 15–41)
Albumin: 3.5 g/dL (ref 3.5–5.0)
Alkaline Phosphatase: 50 U/L (ref 38–126)
Anion gap: 9 (ref 5–15)
BUN: 10 mg/dL (ref 6–20)
CO2: 23 mmol/L (ref 22–32)
Calcium: 8.9 mg/dL (ref 8.9–10.3)
Chloride: 102 mmol/L (ref 98–111)
Creatinine, Ser: 0.97 mg/dL (ref 0.61–1.24)
GFR, Estimated: >60 mL/min (ref >60)
Glucose, Bld: 76 mg/dL (ref 70–99)
Potassium: 3.7 mmol/L (ref 3.5–5.1)
Sodium: 134 mmol/L — ABNORMAL LOW (ref 135–145)
Total Bilirubin: 1.6 mg/dL — ABNORMAL HIGH (ref 0.3–1.2)
Total Protein: 5.9 g/dL — ABNORMAL LOW (ref 6.5–8.1)

## 2023-03-29 LAB — CBC
HCT: 34.6 % — ABNORMAL LOW (ref 39.0–52.0)
Hemoglobin: 12.2 g/dL — ABNORMAL LOW (ref 13.0–17.0)
MCH: 33.7 pg (ref 26.0–34.0)
MCHC: 35.3 g/dL (ref 30.0–36.0)
MCV: 95.6 fL (ref 80.0–100.0)
Platelets: 107 10*3/uL — ABNORMAL LOW (ref 150–400)
RBC: 3.62 MIL/uL — ABNORMAL LOW (ref 4.22–5.81)
RDW: 11.4 % — ABNORMAL LOW (ref 11.5–15.5)
WBC: 2.4 10*3/uL — ABNORMAL LOW (ref 4.0–10.5)
nRBC: 0 % (ref 0.0–0.2)

## 2023-03-29 LAB — MAGNESIUM: Magnesium: 1.8 mg/dL (ref 1.7–2.4)

## 2023-03-29 LAB — PHOSPHORUS: Phosphorus: 3.2 mg/dL (ref 2.5–4.6)

## 2023-03-29 MED ORDER — LOPERAMIDE HCL 2 MG PO CAPS: 2.0000 mg | ORAL_CAPSULE | ORAL | Status: DC | PRN

## 2023-03-29 MED ORDER — CHLORDIAZEPOXIDE HCL 5 MG PO CAPS
25.0000 mg | ORAL_CAPSULE | Freq: Four times a day (QID) | ORAL | Status: AC
  Administered 2023-03-29 (×4): 25 mg via ORAL
  Filled 2023-03-29 (×3): qty 5

## 2023-03-29 MED ORDER — ADULT MULTIVITAMIN W/MINERALS CH
1.0000 | ORAL_TABLET | Freq: Every day | ORAL | Status: DC
  Administered 2023-03-30: 1 via ORAL
  Filled 2023-03-29 (×2): qty 1

## 2023-03-29 MED ORDER — CHLORDIAZEPOXIDE HCL 5 MG PO CAPS
25.0000 mg | ORAL_CAPSULE | Freq: Four times a day (QID) | ORAL | Status: DC | PRN
Start: 2023-03-29 — End: 2023-03-29

## 2023-03-29 MED ORDER — CHLORDIAZEPOXIDE HCL 5 MG PO CAPS: 25.0000 mg | ORAL_CAPSULE | ORAL | Status: DC

## 2023-03-29 MED ORDER — CHLORDIAZEPOXIDE HCL 5 MG PO CAPS
25.0000 mg | ORAL_CAPSULE | Freq: Once | ORAL | Status: DC
  Filled 2023-03-29: qty 5

## 2023-03-29 MED ORDER — THIAMINE HCL 100 MG/ML IJ SOLN: 100.0000 mg | Freq: Once | INTRAMUSCULAR | Status: DC

## 2023-03-29 MED ORDER — ONDANSETRON 4 MG PO TBDP
4.0000 mg | ORAL_TABLET | Freq: Four times a day (QID) | ORAL | Status: DC | PRN
  Administered 2023-03-30: 4 mg via ORAL
  Filled 2023-03-29: qty 1

## 2023-03-29 MED ORDER — LISINOPRIL 20 MG PO TABS
20.0000 mg | ORAL_TABLET | Freq: Every day | ORAL | Status: DC
  Administered 2023-03-30: 20 mg via ORAL
  Filled 2023-03-29 (×2): qty 1

## 2023-03-29 MED ORDER — HYDROXYZINE HCL 25 MG PO TABS: 25.0000 mg | ORAL_TABLET | Freq: Four times a day (QID) | ORAL | Status: DC | PRN

## 2023-03-29 MED ORDER — CHLORDIAZEPOXIDE HCL 5 MG PO CAPS: 25.0000 mg | ORAL_CAPSULE | Freq: Every day | ORAL | Status: DC

## 2023-03-29 MED ORDER — CHLORDIAZEPOXIDE HCL 5 MG PO CAPS
25.0000 mg | ORAL_CAPSULE | Freq: Three times a day (TID) | ORAL | Status: DC
  Administered 2023-03-30: 25 mg via ORAL
  Filled 2023-03-29: qty 5

## 2023-03-29 NOTE — Plan of Care (Signed)
  Problem: Education: Goal: Knowledge of General Education information will improve Description: Including pain rating scale, medication(s)/side effects and non-pharmacologic comfort measures Outcome: Progressing   Problem: Health Behavior/Discharge Planning: Goal: Ability to manage health-related needs will improve Outcome: Progressing   Problem: Pain Managment: Goal: General experience of comfort will improve Outcome: Progressing   

## 2023-03-29 NOTE — Progress Notes (Signed)
   03/28/23 2200  Assess: MEWS Score  Level of Consciousness Alert  Assess: MEWS Score  MEWS Temp 0  MEWS Systolic 0  MEWS Pulse 2  MEWS RR 0  MEWS LOC 0  MEWS Score 2  MEWS Score Color Yellow  Assess: if the MEWS score is Yellow or Red  Were vital signs taken at a resting state? No  Focused Assessment No change from prior assessment  Does the patient meet 2 or more of the SIRS criteria? No  MEWS guidelines implemented  Yes, yellow  Treat  MEWS Interventions Considered administering scheduled or prn medications/treatments as ordered  Take Vital Signs  Increase Vital Sign Frequency  Yellow: Q2hr x1, continue Q4hrs until patient remains green for 12hrs  Escalate  MEWS: Escalate Yellow: Discuss with charge nurse and consider notifying provider and/or RRT  Notify: Charge Nurse/RN  Name of Charge Nurse/RN Notified Francisca RN   D/t patient's admission statis medication was not able to be given at the time for agitation/Dts.

## 2023-03-29 NOTE — Assessment & Plan Note (Signed)
-   Questionable seizure episode upon admission.  Patient not noted to be postictal afterwards however - Neurology consulted; EEG appears to be negative - Continue supporting alcohol withdrawal with CIWA protocol.  Started Librium taper as well -Withdrawal symptoms lessened/CIWA scores down trended and patient considered stable for discharging home and finishing Librium taper

## 2023-03-29 NOTE — Assessment & Plan Note (Addendum)
-   suspect encephalopathy is due to early withdrawal on admission given symptoms of tremors and tachycardia with recent fall too - continue etoh w/d support -MRI brain negative for acute abnormality however does show some volume loss already which is certainly premature at his age (would suspect etoh induced)

## 2023-03-29 NOTE — Assessment & Plan Note (Signed)
-   Prior LFTs consistent with elevated AST to ALT ratio, consistent with alcohol abuse - Recent LFT pattern not fully consistent but still suspect elevation due to his ongoing excessive alcohol use - Hepatitis panel also negative - No significant concern for infection at this time - Trend LFTs for now -RUQ ultrasound notable for hepatic steatosis

## 2023-03-29 NOTE — Hospital Course (Signed)
Adam [name] is a 37 yo male with PMH alcohol abuse, HTN, prior seizure episode December 2023 who presented after a fall at home.  Per EMS on admission, he had had a syncopal episode and fell down approximately 8 stairs.  Patient was noted to be intoxicated at the time and does not remember the fall.  He also presented with visual and auditory hallucinations. Alcohol level in the ER was 300 and he had a witnessed episode of seizure activity by staff.  He was admitted for further seizure workup and neurology evaluation.

## 2023-03-29 NOTE — Plan of Care (Signed)

## 2023-03-29 NOTE — Assessment & Plan Note (Signed)
-   Some elevation of blood pressure in setting of active withdrawal - Resuming lisinopril as able

## 2023-03-29 NOTE — TOC CAGE-AID Note (Signed)
Transition of Care [facility]) - CAGE-AID Screening   Patient Details  Name: Adam [name]. MRN: [id number] Date of Birth: 1986-08-10  Transition of Care [facility]) CM/SW Contact:    Kermit Balo, RN Phone Number: 03/29/2023, 1:30 PM   Clinical Narrative: CM provided him inpatient/ outpatient counseling resources.   CAGE-AID Screening:    Have You Ever Felt You Ought to Cut Down on Your Drinking or Drug Use?: Yes Have People Annoyed You By Critizing Your Drinking Or Drug Use?: Yes Have You Felt Bad Or Guilty About Your Drinking Or Drug Use?: Yes Have You Ever Had a Drink or Used Drugs First Thing In The Morning to Steady Your Nerves or to Get Rid of a Hangover?: Yes CAGE-AID Score: 4  Substance Abuse Education Offered: Yes  Substance abuse interventions: Transport planner, Patient Counseling

## 2023-03-29 NOTE — TOC Initial Note (Signed)
Transition of Care (TOC) - Initial/Assessment Note    Patient Details  Name: Adam [name] [name]. MRN: [id number] Date of Birth: 1985-12-25  Transition of Care [facility]) CM/SW Contact:    Kermit Balo, RN Phone Number: 03/29/2023, 1:35 PM  Clinical Narrative:                 Pt states he is from home with his spouse and children.  No PCP and no health insurance. Pt was in agreement with getting set up with one of the [facility]. CM has left VM to obtain an appointment. No DME at home.  Pt states he does take prescription medications at home and denies any issues.  Pt drives self as needed.  TOC following.  Expected Discharge Plan: Home/Self Care Barriers to Discharge: Continued Medical Work up   Patient Goals and CMS Choice            Expected Discharge Plan and Services   Discharge Planning Services: CM Consult   Living arrangements for the past 2 months: Apartment                                      Prior Living Arrangements/Services Living arrangements for the past 2 months: Apartment Lives with:: Minor Children, Spouse Patient language and need for interpreter reviewed:: Yes Do you feel safe going back to the place where you live?: Yes            Criminal Activity/Legal Involvement Pertinent to Current Situation/Hospitalization: No - Comment as needed  Activities of Daily Living      Permission Sought/Granted                  Emotional Assessment Appearance:: Appears stated age Attitude/Demeanor/Rapport:  (sleepy)   Orientation: : Oriented to Self, Oriented to Place, Oriented to Situation   Psych Involvement: No (comment)  Admission diagnosis:  Acute encephalopathy [G93.40] Patient Active Problem List   Diagnosis Date Noted   Acute encephalopathy 03/28/2023   Alcohol use disorder 03/05/2023   Acute hyperactive alcohol withdrawal delirium (HCC) 01/07/2023   Alcohol withdrawal delirium, acute, hyperactive (HCC) 12/15/2022    AKI (acute kidney injury) (HCC)    Lactic acidosis    Seizures (HCC) 05/25/2022   Avulsion fracture of ankle 05/25/2022   Chronic alcohol abuse 04/24/2022   Acute alcoholic hallucinosis (HCC) 04/24/2022   Transaminitis 04/24/2022   Alcohol withdrawal delirium (HCC) 01/29/2020   Alcohol withdrawal seizure with complication, with unspecified complication (HCC) 01/28/2020   Seizure (HCC) 01/21/2020   Alcohol withdrawal seizure (HCC) 11/10/2019   Benzodiazepine withdrawal (HCC) 11/10/2019   Hypokalemia 11/10/2019   Adjustment disorder with depressed mood 11/10/2019   Alcohol withdrawal (HCC) 09/24/2019   Chest pain 09/24/2019   Alcohol withdrawal seizure without complication (HCC)    Pneumothorax 04/28/2019   Primary hypertension 06/25/2010   PCP:  Pcp, No Pharmacy:   [facility] [address] [place] [address] Phone: [phone] Fax: [phone]  [facility] [address], [place] - [address] [place] [address] Phone: [phone] Fax: [phone]     Social Determinants of Health (SDOH) Social History: SDOH Screenings   Food Insecurity: No Food Insecurity (03/04/2023)  Housing: Low Risk  (03/04/2023)  Transportation Needs: No Transportation Needs (03/04/2023)  Utilities: Not At Risk (03/04/2023)  Alcohol Screen: Medium Risk (04/29/2019)  Tobacco  Use: High Risk (03/04/2023)   SDOH Interventions:     Readmission Risk Interventions     No data to display

## 2023-03-29 NOTE — Progress Notes (Signed)
Progress Note    Adam [name].   ZOX:[id number]  DOB: 01/08/1986  DOA: 03/28/2023     1 PCP: Pcp, No  Initial CC: fall at home; etoh w/d  Hospital Course: Adam [name] is a 37 yo male with PMH alcohol abuse, HTN, prior seizure episode December 2023 who presented after a fall at home.  Per EMS on admission, he had had a syncopal episode and fell down approximately 8 stairs.  Patient was noted to be intoxicated at the time and does not remember the fall.  He also presented with visual and auditory hallucinations. Alcohol level in the ER was 300 and he had a witnessed episode of seizure activity by staff.  He was admitted for further seizure workup and neurology evaluation.  Interval History:  Resting in bed when seen this morning.  Still having active tremoring in his hands.  Mood was cooperative and pleasant.  He wishes to remain hospitalized for further alcohol withdrawal.  Assessment and Plan: * Acute encephalopathy - suspect encephalopathy is due to early withdrawal on admission given symptoms of tremors and tachycardia with recent fall too - continue etoh w/d support -MRI brain negative for acute abnormality however does show some volume loss already which is certainly premature at his age (would suspect etoh induced)  Alcohol withdrawal (HCC) - Questionable seizure episode upon admission.  Patient not noted to be postictal afterwards however - Neurology also following.  EEG appears to be negative; follow-up further recommendations - Continue supporting alcohol withdrawal with CIWA protocol.  Start Librium taper as well  Transaminitis - Prior LFTs consistent with elevated AST to ALT ratio, consistent with alcohol abuse - Recent LFT pattern not fully consistent but still suspect elevation due to his ongoing excessive alcohol use - Hepatitis panel also negative - No significant concern for infection at this time - Trend LFTs for now -RUQ ultrasound notable for hepatic  steatosis  Primary hypertension - Some elevation of blood pressure in setting of active withdrawal - Resuming lisinopril as able  Old records reviewed in assessment of this patient  Antimicrobials:   DVT prophylaxis:  SCDs Start: 03/29/23 0124   Code Status:   Code Status: Full Code  Mobility Assessment (last 72 hours)     Mobility Assessment     Row Name 03/29/23 0745 03/28/23 2200         Does patient have an order for bedrest or is patient medically unstable -- No - Continue assessment      What is the highest level of mobility based on the progressive mobility assessment? Level 6 (Walks independently in room and hall) - Balance while walking in room without assist - Complete Level 6 (Walks independently in room and hall) - Balance while walking in room without assist - Complete               Barriers to discharge: none Disposition Plan:  Home Status is: Inpt  Objective: Blood pressure 121/80, pulse (!) 59, temperature 97.9 F (36.6 C), temperature source Oral, resp. rate 16, SpO2 97 %.  Examination:  Physical Exam Constitutional:      Comments: No distress but active tremoring appreciated in upper extremities  HENT:     Head: Normocephalic and atraumatic.     Mouth/Throat:     Mouth: Mucous membranes are moist.  Eyes:     Extraocular Movements: Extraocular movements intact.  Cardiovascular:     Rate and Rhythm: Normal rate and regular rhythm.  Pulmonary:  Effort: Pulmonary effort is normal. No respiratory distress.     Breath sounds: Normal breath sounds. No wheezing.  Abdominal:     General: Bowel sounds are normal. There is no distension.     Palpations: Abdomen is soft.     Tenderness: There is no abdominal tenderness.  Musculoskeletal:        General: Normal range of motion.     Cervical back: Normal range of motion and neck supple.  Skin:    General: Skin is warm and dry.  Neurological:     General: No focal deficit present.     Comments:  Bilateral upper extremity tremor at rest appreciated  Psychiatric:        Mood and Affect: Mood normal.        Behavior: Behavior normal.      Consultants:  Neurology  Procedures:    Data Reviewed: Results for orders placed or performed during the hospital encounter of 03/28/23 (from the past 24 hour(s))  Comprehensive metabolic panel     Status: Abnormal   Collection Time: 03/29/23  5:16 AM  Result Value Ref Range   Sodium 134 (L) 135 - 145 mmol/L   Potassium 3.7 3.5 - 5.1 mmol/L   Chloride 102 98 - 111 mmol/L   CO2 23 22 - 32 mmol/L   Glucose, Bld 76 70 - 99 mg/dL   BUN 10 6 - 20 mg/dL   Creatinine, Ser 4.09 0.61 - 1.24 mg/dL   Calcium 8.9 8.9 - 81.1 mg/dL   Total Protein 5.9 (L) 6.5 - 8.1 g/dL   Albumin 3.5 3.5 - 5.0 g/dL   AST 98 (H) 15 - 41 U/L   ALT 128 (H) 0 - 44 U/L   Alkaline Phosphatase 50 38 - 126 U/L   Total Bilirubin 1.6 (H) 0.3 - 1.2 mg/dL   GFR, Estimated >91 >47 mL/min   Anion gap 9 5 - 15  CBC     Status: Abnormal   Collection Time: 03/29/23  5:16 AM  Result Value Ref Range   WBC 2.4 (L) 4.0 - 10.5 K/uL   RBC 3.62 (L) 4.22 - 5.81 MIL/uL   Hemoglobin 12.2 (L) 13.0 - 17.0 g/dL   HCT 82.9 (L) 56.2 - 13.0 %   MCV 95.6 80.0 - 100.0 fL   MCH 33.7 26.0 - 34.0 pg   MCHC 35.3 30.0 - 36.0 g/dL   RDW 86.5 (L) 78.4 - 69.6 %   Platelets 107 (L) 150 - 400 K/uL   nRBC 0.0 0.0 - 0.2 %  Magnesium     Status: None   Collection Time: 03/29/23  5:16 AM  Result Value Ref Range   Magnesium 1.8 1.7 - 2.4 mg/dL  Phosphorus     Status: None   Collection Time: 03/29/23  5:16 AM  Result Value Ref Range   Phosphorus 3.2 2.5 - 4.6 mg/dL    I have reviewed pertinent nursing notes, vitals, labs, and images as necessary. I have ordered labwork to follow up on as indicated.  I have reviewed the last notes from staff over past 24 hours. I have discussed patient's care plan and test results with nursing staff, CM/SW, and other staff as appropriate.  Time spent: Greater than  50% of the 55 minute visit was spent in counseling/coordination of care for the patient as laid out in the A&P.   LOS: 1 day   Lewie Chamber, MD [facility] 03/29/2023, 12:16 PM

## 2023-03-30 ENCOUNTER — Other Ambulatory Visit (HOSPITAL_COMMUNITY): Payer: Self-pay

## 2023-03-30 ENCOUNTER — Encounter: Payer: Self-pay | Admitting: Internal Medicine

## 2023-03-30 LAB — COMPREHENSIVE METABOLIC PANEL
ALT: 177 U/L — ABNORMAL HIGH (ref 0–44)
AST: 267 U/L — ABNORMAL HIGH (ref 15–41)
Albumin: 3.4 g/dL — ABNORMAL LOW (ref 3.5–5.0)
Alkaline Phosphatase: 53 U/L (ref 38–126)
Anion gap: 9 (ref 5–15)
BUN: 12 mg/dL (ref 6–20)
CO2: 25 mmol/L (ref 22–32)
Calcium: 9 mg/dL (ref 8.9–10.3)
Chloride: 97 mmol/L — ABNORMAL LOW (ref 98–111)
Creatinine, Ser: 1.13 mg/dL (ref 0.61–1.24)
GFR, Estimated: >60 mL/min (ref >60)
Glucose, Bld: 102 mg/dL — ABNORMAL HIGH (ref 70–99)
Potassium: 3.9 mmol/L (ref 3.5–5.1)
Sodium: 131 mmol/L — ABNORMAL LOW (ref 135–145)
Total Bilirubin: 1 mg/dL (ref 0.3–1.2)
Total Protein: 5.7 g/dL — ABNORMAL LOW (ref 6.5–8.1)

## 2023-03-30 LAB — CBC WITH DIFFERENTIAL/PLATELET
Abs Immature Granulocytes: 0.02 10*3/uL (ref 0.00–0.07)
Basophils Absolute: 0 10*3/uL (ref 0.0–0.1)
Basophils Relative: 1 %
Eosinophils Absolute: 0.1 10*3/uL (ref 0.0–0.5)
Eosinophils Relative: 2 %
HCT: 36.6 % — ABNORMAL LOW (ref 39.0–52.0)
Hemoglobin: 13.2 g/dL (ref 13.0–17.0)
Immature Granulocytes: 1 %
Lymphocytes Relative: 19 %
Lymphs Abs: 0.6 10*3/uL — ABNORMAL LOW (ref 0.7–4.0)
MCH: 33.7 pg (ref 26.0–34.0)
MCHC: 36.1 g/dL — ABNORMAL HIGH (ref 30.0–36.0)
MCV: 93.4 fL (ref 80.0–100.0)
Monocytes Absolute: 0.2 10*3/uL (ref 0.1–1.0)
Monocytes Relative: 6 %
Neutro Abs: 2.5 10*3/uL (ref 1.7–7.7)
Neutrophils Relative %: 71 %
Platelets: 105 10*3/uL — ABNORMAL LOW (ref 150–400)
RBC: 3.92 MIL/uL — ABNORMAL LOW (ref 4.22–5.81)
RDW: 11.4 % — ABNORMAL LOW (ref 11.5–15.5)
WBC: 3.4 10*3/uL — ABNORMAL LOW (ref 4.0–10.5)
nRBC: 0 % (ref 0.0–0.2)

## 2023-03-30 LAB — MAGNESIUM: Magnesium: 1.8 mg/dL (ref 1.7–2.4)

## 2023-03-30 MED ORDER — CHLORDIAZEPOXIDE HCL 25 MG PO CAPS
ORAL_CAPSULE | ORAL | 0 refills | Status: DC
  Filled 2023-03-30: qty 6, 3d supply, fill #0

## 2023-03-30 MED ORDER — LISINOPRIL 30 MG PO TABS
30.0000 mg | ORAL_TABLET | Freq: Every day | ORAL | 3 refills | Status: DC
  Filled 2023-03-30: qty 30, 30d supply, fill #0

## 2023-03-30 NOTE — TOC Transition Note (Signed)
Transition of Care [facility]) - CM/SW Discharge Note   Patient Details  Name: Adam [name] [name]. MRN: [id number] Date of Birth: Apr 19, 1986  Transition of [facility] CM/SW Contact:  Kermit Balo, RN Phone Number: 03/30/2023, 9:39 AM   Clinical Narrative:    Patient is discharging home with self care. Medications for home to be delivered to the room per [facility] pharmacy. Pt has resources for alcohol counseling.  Pt has transportation home.    Final next level of care: Home/Self Care Barriers to Discharge: Inadequate or no insurance, Barriers Unresolved (comment)   Patient Goals and CMS Choice      Discharge Placement                         Discharge Plan and Services Additional resources added to the After Visit Summary for     Discharge Planning Services: CM Consult                                 Social Determinants of Health (SDOH) Interventions SDOH Screenings   Food Insecurity: No Food Insecurity (03/04/2023)  Housing: Low Risk  (03/04/2023)  Transportation Needs: No Transportation Needs (03/04/2023)  Utilities: Not At Risk (03/04/2023)  Alcohol Screen: Medium Risk (04/29/2019)  Tobacco Use: High Risk (03/04/2023)     Readmission Risk Interventions     No data to display

## 2023-03-30 NOTE — Discharge Summary (Signed)
Physician Discharge Summary   Adam Peoples Schutter Jr. ZOX:[id number] DOB: Jun 07, 1986 DOA: 03/28/2023  PCP: Pcp, No  Admit date: 03/28/2023 Discharge date: 03/30/2023   Admitted From: Home Disposition:  Home Discharging physician: Lewie Chamber, MD Barriers to discharge: none  Recommendations at discharge: Enourage ongoing etoh cessation    Discharge Condition: stable CODE STATUS: Full Diet recommendation:  Diet Orders (From admission, onward)     Start     Ordered   03/30/23 0000  Diet general        03/30/23 0932   03/28/23 2107  Diet regular Room service appropriate? Yes; Fluid consistency: Thin  Diet effective now       Question Answer Comment  Room service appropriate? Yes   Fluid consistency: Thin      03/28/23 2107            Hospital Course: Mr. Larmore is a 37 yo male with PMH alcohol abuse, HTN, prior seizure episode December 2023 who presented after a fall at home.  Per EMS on admission, he had had a syncopal episode and fell down approximately 8 stairs.  Patient was noted to be intoxicated at the time and does not remember the fall.  He also presented with visual and auditory hallucinations. Alcohol level in the ER was 300 and he had a witnessed episode of seizure activity by staff.  He was admitted for further seizure workup and neurology evaluation.  Assessment and Plan: * Acute encephalopathy-resolved as of 03/30/2023 - suspect encephalopathy is due to early withdrawal on admission given symptoms of tremors and tachycardia with recent fall too - continue etoh w/d support -MRI brain negative for acute abnormality however does show some volume loss already which is certainly premature at his age (would suspect etoh induced)  Alcohol withdrawal (HCC) - Questionable seizure episode upon admission.  Patient not noted to be postictal afterwards however - Neurology consulted; EEG appears to be negative - Continue supporting alcohol withdrawal with CIWA protocol.   Started Librium taper as well -Withdrawal symptoms lessened/CIWA scores down trended and patient considered stable for discharging home and finishing Librium taper  Transaminitis - LFTs consistent with elevated AST to ALT ratio, consistent with alcohol abuse - Hepatitis panel also negative - No significant concern for infection at this time -RUQ ultrasound notable for hepatic steatosis - Total bili not elevated enough to cause high MDF and low suspicion for etoh hepatitis   Primary hypertension - Some elevation of blood pressure in setting of active withdrawal - Resuming lisinopril as able       The patient's chronic medical conditions were treated accordingly per the patient's home medication regimen except as noted.  On day of discharge, patient was felt deemed stable for discharge. Patient/family member advised to call PCP or come back to ER if needed.   Principal Diagnosis: Acute encephalopathy  Discharge Diagnoses: Active Hospital Problems   Diagnosis Date Noted   Alcohol withdrawal (HCC) 09/24/2019    Priority: 1.   Transaminitis 04/24/2022   Primary hypertension 06/25/2010    Resolved Hospital Problems   Diagnosis Date Noted Date Resolved   Acute encephalopathy 03/28/2023 03/30/2023    Priority: 1.     Discharge Instructions     Diet general   Complete by: As directed    Increase activity slowly   Complete by: As directed       Allergies as of 03/30/2023       Reactions   Bee Venom Anaphylaxis   Lovenox [enoxaparin] Rash  Medication List     TAKE these medications    acamprosate 333 MG tablet Commonly known as: CAMPRAL Take 2 tablets (666 mg total) by mouth 3 (three) times daily with meals.   chlordiazePOXIDE 25 MG capsule Commonly known as: LIBRIUM Take 1 capsule 3 times a day for 1 day then 2 times a day for 1 day then daily for 1 day What changed:  medication strength additional instructions   folic acid 1 MG tablet Commonly known  as: FOLVITE Take 1 tablet (1 mg total) by mouth daily.   lisinopril 30 MG tablet Commonly known as: ZESTRIL Take 1 tablet (30 mg total) by mouth daily. What changed:  medication strength how much to take   multivitamin with minerals Tabs tablet Take 1 tablet by mouth daily.   thiamine 100 MG tablet Commonly known as: VITAMIN B1 Take 1 tablet (100 mg total) by mouth daily.        Follow-up Information     [facility] Follow up on 04/06/2023.   Specialty: Family Medicine Why: Your appointment is at 9:30 am. Please arrive early and bring a picture ID and your current medications. Contact information: [address] [place] [address] [phone]        [facility] Follow up.   Why: Use this pharmacy for assistance with your medications Contact information: [address], [place], [place] [address]  Phone: [phone]               Allergies  Allergen Reactions   Bee Venom Anaphylaxis   Lovenox [Enoxaparin] Rash    Consultations: Neurology  Procedures:   Discharge Exam: BP 112/83 (BP Location: Left Arm)   Pulse 61   Temp 98.8 F (37.1 C) (Oral)   Resp 18   SpO2 97%  Physical Exam Constitutional:      Comments: No distress and improved upper extremity tremors  HENT:     Head: Normocephalic and atraumatic.     Mouth/Throat:     Mouth: Mucous membranes are moist.  Eyes:     Extraocular Movements: Extraocular movements intact.  Cardiovascular:     Rate and Rhythm: Normal rate and regular rhythm.  Pulmonary:     Effort: Pulmonary effort is normal. No respiratory distress.     Breath sounds: Normal breath sounds. No wheezing.  Abdominal:     General: Bowel sounds are normal. There is no distension.     Palpations: Abdomen is soft.     Tenderness: There is no abdominal tenderness.  Musculoskeletal:        General: Normal range of motion.     Cervical back: Normal  range of motion and neck supple.  Skin:    General: Skin is warm and dry.  Neurological:     General: No focal deficit present.     Comments: Minimal bilateral upper extremity tremor noted  Psychiatric:        Mood and Affect: Mood normal.        Behavior: Behavior normal.      The results of significant diagnostics from this hospitalization (including imaging, microbiology, ancillary and laboratory) are listed below for reference.   Microbiology: No results found for this or any previous visit (from the past 240 hour(s)).   Labs: BNP (last 3 results) Recent Labs    09/01/22 0248 09/02/22 0309 12/10/22 1648  BNP 109.9* 45.4 15.0   Basic Metabolic Panel: Recent Labs  Lab 03/28/23 0354  03/29/23 0516 03/30/23 0551  NA 137 134* 131*  K 3.6 3.7 3.9  CL 99 102 97*  CO2 22 23 25   GLUCOSE 98 76 102*  BUN 11 10 12   CREATININE 1.19 0.97 1.13  CALCIUM 8.9 8.9 9.0  MG 1.7 1.8 1.8  PHOS  --  3.2  --    Liver Function Tests: Recent Labs  Lab 04-Apr-2023 0354 03/29/23 0516 03/30/23 0551  AST 165* 98* 267*  ALT 172* 128* 177*  ALKPHOS 56 50 53  BILITOT 0.8 1.6* 1.0  PROT 6.7 5.9* 5.7*  ALBUMIN 4.4 3.5 3.4*   No results for input(s): "LIPASE", "AMYLASE" in the last 168 hours. No results for input(s): "AMMONIA" in the last 168 hours. CBC: Recent Labs  Lab 04-04-2023 0354 03/29/23 0516 03/30/23 0338  WBC 5.3 2.4* 3.4*  NEUTROABS 3.1  --  2.5  HGB 13.0 12.2* 13.2  HCT 37.3* 34.6* 36.6*  MCV 95.9 95.6 93.4  PLT 144* 107* 105*   Cardiac Enzymes: No results for input(s): "CKTOTAL", "CKMB", "CKMBINDEX", "TROPONINI" in the last 168 hours. BNP: Invalid input(s): "POCBNP" CBG: Recent Labs  Lab 04-Apr-2023 0256  GLUCAP 80   D-Dimer No results for input(s): "DDIMER" in the last 72 hours. Hgb A1c No results for input(s): "HGBA1C" in the last 72 hours. Lipid Profile No results for input(s): "CHOL", "HDL", "LDLCALC", "TRIG", "CHOLHDL", "LDLDIRECT" in the last 72  hours. Thyroid function studies No results for input(s): "TSH", "T4TOTAL", "T3FREE", "THYROIDAB" in the last 72 hours.  Invalid input(s): "FREET3" Anemia work up Recent Labs    Apr 04, 2023 0700  VITAMINB12 448   Urinalysis    Component Value Date/Time   COLORURINE YELLOW 2023-04-04 0845   APPEARANCEUR CLEAR 04-04-23 0845   LABSPEC 1.014 Apr 04, 2023 0845   PHURINE 6.0 04-04-23 0845   GLUCOSEU NEGATIVE 04/04/2023 0845   HGBUR NEGATIVE 04-04-23 0845   BILIRUBINUR NEGATIVE 04-04-2023 0845   KETONESUR 5 (A) 04-04-23 0845   PROTEINUR NEGATIVE 04-Apr-2023 0845   NITRITE NEGATIVE Apr 04, 2023 0845   LEUKOCYTESUR NEGATIVE 04-04-2023 0845   Sepsis Labs Recent Labs  Lab 2023/04/04 0354 03/29/23 0516 03/30/23 0338  WBC 5.3 2.4* 3.4*   Microbiology No results found for this or any previous visit (from the past 240 hour(s)).  Procedures/Studies: EEG adult  Result Date: 04/04/23 Charlsie Quest, MD     Apr 04, 2023 11:59 AM Patient Name: Wyline Copas MRN: [id number] Epilepsy Attending: Charlsie Quest Referring Physician/Provider: Darlin Drop, DO Date: 2023-04-04 Duration: 22.36 mins Patient history: 37 year old presenting with concern for seizure activity in the setting of acute alcohol intoxication. Level of alertness: Awake, asleep AEDs during EEG study: Ativan Technical aspects: This EEG study was done with scalp electrodes positioned according to the 10-20 International system of electrode placement. Electrical activity was reviewed with band pass filter of 1-70Hz , sensitivity of 7 uV/mm, display speed of 57mm/sec with a 60Hz  notched filter applied as appropriate. EEG data were recorded continuously and digitally stored.  Video monitoring was available and reviewed as appropriate. Description: The posterior dominant rhythm consists of 8 Hz activity of moderate voltage (25-35 uV) seen predominantly in posterior head regions, symmetric and reactive to eye opening and eye closing.  Sleep was characterized by vertex waves, sleep spindles (12 to 14 Hz), maximal frontocentral region. Hyperventilation and photic stimulation were not performed.   IMPRESSION: This study is within normal limits. No seizures or epileptiform discharges were seen throughout the recording. A normal interictal EEG does not exclude the diagnosis  of epilepsy. Priyanka Annabelle Harman   US Abdomen Limited RUQ (LIVER/GB)  Result Date: 03/28/2023 CLINICAL DATA:  221910 Elevated LFTs 221910 EXAM: ULTRASOUND ABDOMEN LIMITED RIGHT UPPER QUADRANT COMPARISON:  None Available. FINDINGS: Gallbladder: No gallstones or wall thickening visualized. No sonographic Murphy sign noted by sonographer. Common bile duct: Diameter: Visualized portion measures 6 mm, within normal limits. Liver: No focal lesion identified. Increased hepatic parenchymal echogenicity with poor acoustic penetrance. Portal vein is patent on color Doppler imaging with normal direction of blood flow towards the liver. Other: None. IMPRESSION: Hepatic steatosis. Electronically Signed   By: Meda Klinefelter M.D.   On: 03/28/2023 07:53   MR BRAIN WO CONTRAST  Result Date: 03/28/2023 CLINICAL DATA:  37 year old male status post syncope, fall down stairs. EXAM: MRI HEAD WITHOUT CONTRAST TECHNIQUE: Multiplanar, multiecho pulse sequences of the brain and surrounding structures were obtained without intravenous contrast. COMPARISON:  Plain head CT 0403 hours today. FINDINGS: Brain: Susceptibility artifact over the anterior vertex is of unclear etiology, not correlated on the earlier CT. DWI and SWI most affected by the susceptibility artifact. Allowing for this no restricted diffusion to suggest acute infarction. No midline shift, mass effect, evidence of mass lesion, ventriculomegaly, extra-axial collection or acute intracranial hemorrhage. Cervicomedullary junction and pituitary are within normal limits. Stable cerebral volume to that described earlier. Wallace Cullens and white matter  signal are within normal limits. No definite chronic cerebral blood products. No encephalomalacia identified. Vascular: Major intracranial vascular flow voids are preserved. Skull and upper cervical spine: Negative visible cervical spine. Visualized bone marrow signal is within normal limits. Sinuses/Orbits: Negative. Other: Visible internal auditory structures appear normal. Negative visible face. IMPRESSION: 1. Scalp vertex Susceptibility artifact of unclear etiology. 2. No acute intracranial abnormality. Negative noncontrast MRI appearance of the brain aside from volume loss greater than expected for age. Electronically Signed   By: Odessa Fleming M.D.   On: 03/28/2023 07:07   CT Head Wo Contrast  Result Date: 03/28/2023 CLINICAL DATA:  37 year old male status post syncope, fall down stairs. EXAM: CT HEAD WITHOUT CONTRAST TECHNIQUE: Contiguous axial images were obtained from the base of the skull through the vertex without intravenous contrast. RADIATION DOSE REDUCTION: This exam was performed according to the departmental dose-optimization program which includes automated exposure control, adjustment of the mA and/or kV according to patient size and/or use of iterative reconstruction technique. COMPARISON:  None Available. FINDINGS: Brain: No midline shift, ventriculomegaly, mass effect, evidence of mass lesion, intracranial hemorrhage or evidence of cortically based acute infarction. Gray-white matter differentiation is within normal limits throughout the brain. But generalized decreased cerebral volume over that expected for age. Vascular: No suspicious intracranial vascular hyperdensity. Skull: Scaphocephaly, normal variant.  No fracture identified. Sinuses/Orbits: Visualized paranasal sinuses and mastoids are clear. Other: No orbit or scalp soft tissue injury identified. IMPRESSION: 1. No acute intracranial abnormality or acute traumatic injury identified. 2. Generalized brain volume loss over that expected for  age, Cerebral Atrophy (ICD10-G31.9). Electronically Signed   By: Odessa Fleming M.D.   On: 03/28/2023 04:15   DG Chest 2 View  Result Date: 03/28/2023 CLINICAL DATA:  37 year old male status post syncope, fall down stairs. EXAM: CHEST - 2 VIEW COMPARISON:  Chest radiographs 09/14/2015 and earlier. FINDINGS: PA and lateral views at 0335 hours. Normal lung volumes and mediastinal contours. Visualized tracheal air column is within normal limits. Both lungs are clear. No pneumothorax or pleural effusion. No acute osseous abnormality identified. Negative visible bowel gas. IMPRESSION: No acute cardiopulmonary abnormality or  acute traumatic injury identified. Electronically Signed   By: Odessa Fleming M.D.   On: 03/28/2023 04:04   CT HEAD WO CONTRAST ( )  Result Date: 03/06/2023 CLINICAL DATA:  Hearing change in tentative EXAM: CT HEAD WITHOUT CONTRAST TECHNIQUE: Contiguous axial images were obtained from the base of the skull through the vertex without intravenous contrast. RADIATION DOSE REDUCTION: This exam was performed according to the departmental dose-optimization program which includes automated exposure control, adjustment of the mA and/or kV according to patient size and/or use of iterative reconstruction technique. COMPARISON:  12/15/2022 FINDINGS: Brain: No evidence of acute infarction, hemorrhage, hydrocephalus, extra-axial collection or mass lesion/mass effect. Generalized brain atrophy for age. Vascular: No hyperdense vessel or unexpected calcification. Skull: Normal. Negative for fracture or focal lesion. Sinuses/Orbits: No acute finding. IMPRESSION: No new or acute finding. Electronically Signed   By: Tiburcio Pea M.D.   On: 03/06/2023 12:28   DG Chest 2 View  Result Date: 03/04/2023 CLINICAL DATA:  Chest pain EXAM: CHEST - 2 VIEW COMPARISON:  Chest radiograph dated 09/12/2022 FINDINGS: Normal lung volumes. No focal consolidations. No pleural effusion or pneumothorax. The heart size and mediastinal  contours are within normal limits. The visualized skeletal structures are unremarkable. IMPRESSION: No active cardiopulmonary disease. Electronically Signed   By: Agustin Cree M.D.   On: 03/04/2023 09:06     Time coordinating discharge: Over 30 minutes    Lewie Chamber, MD  [facility] 03/30/2023, 2:37 PM

## 2023-04-01 LAB — VITAMIN B1: Vitamin B1 (Thiamine): 140.5 nmol/L (ref 66.5–200.0)

## 2023-04-06 ENCOUNTER — Inpatient Hospital Stay (INDEPENDENT_AMBULATORY_CARE_PROVIDER_SITE_OTHER): Payer: Self-pay | Admitting: Primary Care

## 2023-04-10 ENCOUNTER — Emergency Department (HOSPITAL_COMMUNITY): Payer: Medicaid Other

## 2023-04-10 ENCOUNTER — Encounter (HOSPITAL_COMMUNITY): Payer: Self-pay | Admitting: Emergency Medicine

## 2023-04-10 ENCOUNTER — Inpatient Hospital Stay (HOSPITAL_COMMUNITY)
Admission: EM | Admit: 2023-04-10 | Discharge: 2023-04-12 | DRG: 894 | Discharge status: Against Medical Advice | Payer: Medicaid Other | Attending: Internal Medicine | Admitting: Internal Medicine

## 2023-04-10 ENCOUNTER — Other Ambulatory Visit: Payer: Self-pay

## 2023-04-10 DIAGNOSIS — F10932 Alcohol use, unspecified with withdrawal with perceptual disturbance: Secondary | ICD-10-CM | POA: Diagnosis not present

## 2023-04-10 DIAGNOSIS — F10939 Alcohol use, unspecified with withdrawal, unspecified: Secondary | ICD-10-CM | POA: Diagnosis present

## 2023-04-10 DIAGNOSIS — F10129 Alcohol abuse with intoxication, unspecified: Secondary | ICD-10-CM | POA: Diagnosis present

## 2023-04-10 DIAGNOSIS — Y908 Blood alcohol level of 240 mg/100 ml or more: Secondary | ICD-10-CM | POA: Diagnosis present

## 2023-04-10 DIAGNOSIS — R7401 Elevation of levels of liver transaminase levels: Secondary | ICD-10-CM | POA: Diagnosis present

## 2023-04-10 DIAGNOSIS — G4089 Other seizures: Secondary | ICD-10-CM | POA: Diagnosis present

## 2023-04-10 DIAGNOSIS — G319 Degenerative disease of nervous system, unspecified: Secondary | ICD-10-CM | POA: Diagnosis present

## 2023-04-10 DIAGNOSIS — Z5329 Procedure and treatment not carried out because of patient's decision for other reasons: Secondary | ICD-10-CM | POA: Diagnosis not present

## 2023-04-10 DIAGNOSIS — F10930 Alcohol use, unspecified with withdrawal, uncomplicated: Principal | ICD-10-CM

## 2023-04-10 DIAGNOSIS — R7989 Other specified abnormal findings of blood chemistry: Secondary | ICD-10-CM | POA: Diagnosis present

## 2023-04-10 DIAGNOSIS — Z8249 Family history of ischemic heart disease and other diseases of the circulatory system: Secondary | ICD-10-CM

## 2023-04-10 DIAGNOSIS — Z8659 Personal history of other mental and behavioral disorders: Secondary | ICD-10-CM

## 2023-04-10 DIAGNOSIS — D72819 Decreased white blood cell count, unspecified: Secondary | ICD-10-CM | POA: Diagnosis present

## 2023-04-10 DIAGNOSIS — D72818 Other decreased white blood cell count: Secondary | ICD-10-CM | POA: Diagnosis present

## 2023-04-10 DIAGNOSIS — R569 Unspecified convulsions: Secondary | ICD-10-CM

## 2023-04-10 DIAGNOSIS — F10929 Alcohol use, unspecified with intoxication, unspecified: Secondary | ICD-10-CM | POA: Diagnosis present

## 2023-04-10 DIAGNOSIS — Z79899 Other long term (current) drug therapy: Secondary | ICD-10-CM

## 2023-04-10 DIAGNOSIS — K760 Fatty (change of) liver, not elsewhere classified: Secondary | ICD-10-CM | POA: Diagnosis present

## 2023-04-10 DIAGNOSIS — Z888 Allergy status to other drugs, medicaments and biological substances status: Secondary | ICD-10-CM

## 2023-04-10 DIAGNOSIS — F10151 Alcohol abuse with alcohol-induced psychotic disorder with hallucinations: Secondary | ICD-10-CM | POA: Diagnosis present

## 2023-04-10 DIAGNOSIS — Z87891 Personal history of nicotine dependence: Secondary | ICD-10-CM

## 2023-04-10 DIAGNOSIS — K7010 Alcoholic hepatitis without ascites: Secondary | ICD-10-CM | POA: Diagnosis present

## 2023-04-10 DIAGNOSIS — I10 Essential (primary) hypertension: Secondary | ICD-10-CM | POA: Diagnosis present

## 2023-04-10 DIAGNOSIS — F10139 Alcohol abuse with withdrawal, unspecified: Principal | ICD-10-CM | POA: Diagnosis present

## 2023-04-10 LAB — CBC WITH DIFFERENTIAL/PLATELET
Abs Immature Granulocytes: 0 10*3/uL (ref 0.00–0.07)
Basophils Absolute: 0.1 10*3/uL (ref 0.0–0.1)
Basophils Relative: 1 %
Eosinophils Absolute: 0 10*3/uL (ref 0.0–0.5)
Eosinophils Relative: 0 %
HCT: 41.8 % (ref 39.0–52.0)
Hemoglobin: 14.1 g/dL (ref 13.0–17.0)
Immature Granulocytes: 0 %
Lymphocytes Relative: 53 %
Lymphs Abs: 1.9 10*3/uL (ref 0.7–4.0)
MCH: 33.4 pg (ref 26.0–34.0)
MCHC: 33.7 g/dL (ref 30.0–36.0)
MCV: 99.1 fL (ref 80.0–100.0)
Monocytes Absolute: 0.3 10*3/uL (ref 0.1–1.0)
Monocytes Relative: 7 %
Neutro Abs: 1.5 10*3/uL — ABNORMAL LOW (ref 1.7–7.7)
Neutrophils Relative %: 39 %
Platelets: 244 10*3/uL (ref 150–400)
RBC: 4.22 MIL/uL (ref 4.22–5.81)
RDW: 12.4 % (ref 11.5–15.5)
WBC: 3.8 10*3/uL — ABNORMAL LOW (ref 4.0–10.5)
nRBC: 0 % (ref 0.0–0.2)

## 2023-04-10 LAB — COMPREHENSIVE METABOLIC PANEL
ALT: 134 U/L — ABNORMAL HIGH (ref 0–44)
AST: 156 U/L — ABNORMAL HIGH (ref 15–41)
Albumin: 4.8 g/dL (ref 3.5–5.0)
Alkaline Phosphatase: 64 U/L (ref 38–126)
Anion gap: 12 (ref 5–15)
BUN: 14 mg/dL (ref 6–20)
CO2: 26 mmol/L (ref 22–32)
Calcium: 9.1 mg/dL (ref 8.9–10.3)
Chloride: 100 mmol/L (ref 98–111)
Creatinine, Ser: 1.29 mg/dL — ABNORMAL HIGH (ref 0.61–1.24)
GFR, Estimated: >60 mL/min (ref >60)
Glucose, Bld: 107 mg/dL — ABNORMAL HIGH (ref 70–99)
Potassium: 4.1 mmol/L (ref 3.5–5.1)
Sodium: 138 mmol/L (ref 135–145)
Total Bilirubin: 0.7 mg/dL (ref 0.3–1.2)
Total Protein: 7.8 g/dL (ref 6.5–8.1)

## 2023-04-10 LAB — MAGNESIUM: Magnesium: 1.9 mg/dL (ref 1.7–2.4)

## 2023-04-10 LAB — ETHANOL: Alcohol, Ethyl (B): 331 mg/dL (ref <10)

## 2023-04-10 MED ORDER — SODIUM CHLORIDE 0.45 % IV SOLN: INTRAVENOUS | Status: AC

## 2023-04-10 MED ORDER — ACETAMINOPHEN 650 MG RE SUPP: 650.0000 mg | Freq: Four times a day (QID) | RECTAL | Status: DC | PRN

## 2023-04-10 MED ORDER — LORAZEPAM 1 MG PO TABS: 1.0000 mg | ORAL_TABLET | Freq: Two times a day (BID) | ORAL | Status: DC

## 2023-04-10 MED ORDER — LORAZEPAM 1 MG PO TABS: 1.0000 mg | ORAL_TABLET | Freq: Four times a day (QID) | ORAL | Status: DC

## 2023-04-10 MED ORDER — ONDANSETRON HCL 4 MG PO TABS
4.0000 mg | ORAL_TABLET | Freq: Four times a day (QID) | ORAL | Status: DC | PRN
  Administered 2023-04-10: 4 mg via ORAL
  Filled 2023-04-10: qty 1

## 2023-04-10 MED ORDER — CARBAMIDE PEROXIDE 6.5 % OT SOLN
10.0000 [drp] | Freq: Two times a day (BID) | OTIC | Status: DC
  Administered 2023-04-11 (×2): 10 [drp] via OTIC
  Filled 2023-04-10: qty 15

## 2023-04-10 MED ORDER — ACETAMINOPHEN 325 MG PO TABS
650.0000 mg | ORAL_TABLET | Freq: Four times a day (QID) | ORAL | Status: DC | PRN
  Administered 2023-04-10 – 2023-04-11 (×2): 650 mg via ORAL
  Filled 2023-04-10 (×2): qty 2

## 2023-04-10 MED ORDER — LISINOPRIL 20 MG PO TABS: 30.0000 mg | ORAL_TABLET | Freq: Every day | ORAL | Status: DC

## 2023-04-10 MED ORDER — LORAZEPAM 1 MG PO TABS
0.0000 mg | ORAL_TABLET | ORAL | Status: DC
  Administered 2023-04-10: 2 mg via ORAL
  Administered 2023-04-11: 1 mg via ORAL
  Administered 2023-04-11: 4 mg via ORAL
  Filled 2023-04-10: qty 2
  Filled 2023-04-10: qty 1
  Filled 2023-04-10: qty 4
  Filled 2023-04-10: qty 2

## 2023-04-10 MED ORDER — ONDANSETRON HCL 4 MG/2ML IJ SOLN
4.0000 mg | Freq: Once | INTRAMUSCULAR | Status: AC
  Administered 2023-04-10: 4 mg via INTRAVENOUS
  Filled 2023-04-10: qty 2

## 2023-04-10 MED ORDER — LORAZEPAM 1 MG PO TABS: 0.0000 mg | ORAL_TABLET | Freq: Three times a day (TID) | ORAL | Status: DC

## 2023-04-10 MED ORDER — LORAZEPAM 1 MG PO TABS
0.0000 mg | ORAL_TABLET | Freq: Four times a day (QID) | ORAL | Status: DC
  Administered 2023-04-10: 2 mg via ORAL
  Filled 2023-04-10: qty 2

## 2023-04-10 MED ORDER — ONDANSETRON HCL 4 MG/2ML IJ SOLN
4.0000 mg | Freq: Four times a day (QID) | INTRAMUSCULAR | Status: DC | PRN
  Administered 2023-04-11: 4 mg via INTRAVENOUS
  Filled 2023-04-10: qty 2

## 2023-04-10 MED ORDER — LORAZEPAM 1 MG PO TABS: 0.0000 mg | ORAL_TABLET | Freq: Two times a day (BID) | ORAL | Status: DC

## 2023-04-10 MED ORDER — LISINOPRIL 20 MG PO TABS
30.0000 mg | ORAL_TABLET | Freq: Every day | ORAL | Status: DC
  Administered 2023-04-10 – 2023-04-11 (×2): 30 mg via ORAL
  Filled 2023-04-10 (×2): qty 1

## 2023-04-10 MED ORDER — THIAMINE MONONITRATE 100 MG PO TABS
100.0000 mg | ORAL_TABLET | Freq: Every day | ORAL | Status: DC
  Administered 2023-04-11: 100 mg via ORAL
  Filled 2023-04-10: qty 1

## 2023-04-10 MED ORDER — DIAZEPAM 5 MG/ML IJ SOLN
10.0000 mg | Freq: Once | INTRAMUSCULAR | Status: AC
  Administered 2023-04-10: 10 mg via INTRAVENOUS
  Filled 2023-04-10: qty 2

## 2023-04-10 MED ORDER — LORAZEPAM 1 MG PO TABS
1.0000 mg | ORAL_TABLET | ORAL | Status: DC | PRN
  Administered 2023-04-10 (×2): 2 mg via ORAL
  Administered 2023-04-11: 4 mg via ORAL
  Administered 2023-04-11: 3 mg via ORAL
  Administered 2023-04-11: 4 mg via ORAL
  Administered 2023-04-12: 1 mg via ORAL
  Filled 2023-04-10: qty 2
  Filled 2023-04-10: qty 4
  Filled 2023-04-10 (×2): qty 1
  Filled 2023-04-10: qty 2
  Filled 2023-04-10: qty 4
  Filled 2023-04-10: qty 3

## 2023-04-10 MED ORDER — LORAZEPAM 1 MG PO TABS
1.0000 mg | ORAL_TABLET | ORAL | Status: DC | PRN
  Administered 2023-04-10: 1 mg via ORAL

## 2023-04-10 MED ORDER — THIAMINE HCL 100 MG/ML IJ SOLN
100.0000 mg | Freq: Every day | INTRAMUSCULAR | Status: DC
  Administered 2023-04-10: 100 mg via INTRAVENOUS
  Filled 2023-04-10: qty 2

## 2023-04-10 MED ORDER — MAGNESIUM SULFATE 2 GM/50ML IV SOLN
2.0000 g | Freq: Once | INTRAVENOUS | Status: AC
  Administered 2023-04-10: 2 g via INTRAVENOUS
  Filled 2023-04-10: qty 50

## 2023-04-10 NOTE — ED Triage Notes (Signed)
  Patient comes in stating he is trying to stop drinking alcohol.  Patient states he had seizure and fell down a flight of stairs two weeks ago.  States he had a drink earlier yesterday and had a seizure last night.  States he fell down several steps after having seizure earlier this evening.  Endorses a ringing in his ears that has been going on for a week.  Endorses visual/auditory hallucinations.  No SI/HI.  Pain 8/10, headache.

## 2023-04-10 NOTE — H&P (Signed)
History and Physical    Patient: Adam [name]. ZOX:[id number] DOB: 1986/03/21 DOA: 04/10/2023 DOS: the patient was seen and examined on 04/10/2023 PCP: Pcp, No  Patient coming from: Home  Chief Complaint:  Chief Complaint  Patient presents with   Withdrawal   HPI: Adam [name]. is a 37 y.o. male with medical history significant of bulimia, hypertension, alcohol abuse, alcohol related seizure who was recently admitted from 03/28/2023 until 03/30/2023 due to acute encephalopathy secondary to alcohol withdrawal who is coming to the emergency department stating that he would like to stop drinking alcohol.  He stated that he had a seizure last night witnessed by his wife.  He has been having tinnitus, visual and auditory hallucinations.  Denied SI or HI.  He has been drinking 1.5 to 2  fifths of liquor every day.  He denied fever, chills, rhinorrhea, sore throat, wheezing or hemoptysis.  No chest pain, palpitations, diaphoresis, PND, orthopnea or pitting edema of the lower extremities.  No abdominal pain, nausea, emesis, diarrhea, constipation, melena or hematochezia.  No flank pain, dysuria, frequency or hematuria.  No polyuria, polydipsia, polyphagia or blurred vision.   ED course: Initial vital signs were temperature 98.2 F, pulse 82, respiration 18, BP 144/91 mmHg O2 sat 94% on room air.  The patient received 2 mg of oral lorazepam.  I added diazepam 10 mg IVP, magnesium sulfate 2 g IVPB and ondansetron 4 mg IVP.  Lab work: CBC showed a white count of 3.9, hemoglobin 14.1 g/dL and platelets 811.  CMP and chemistry showing AST of 156, ALT of 134 units/L, a glucose of 107, creatinine 1.29, magnesium was 1.9 and alcohol 331 mg/dL.  The rest of the CMP values are normal.  Imaging: CT head without contrast shows cerebral atrophy, but no acute intracranial abnormality.   Review of Systems: As mentioned in the history of present illness. All other systems reviewed and are  negative. Past Medical History:  Diagnosis Date   Alcohol abuse    Eating disorder    history of bulemia   HYPERTENSION 06/25/2010   Seizure (HCC)    ETOH Related Seizure   History reviewed. No pertinent surgical history. Social History:  reports that he has quit smoking. He uses smokeless tobacco. He reports current alcohol use of about 70.0 standard drinks of alcohol per week. He reports that he does not currently use drugs.  Allergies  Allergen Reactions   Bee Venom Anaphylaxis    Family History  Problem Relation Age of Onset   Cancer Mother        lung   Hypertension Father     Prior to Admission medications   Medication Sig Start Date End Date Taking? Authorizing Provider  lisinopril (ZESTRIL) 30 MG tablet Take 1 tablet (30 mg total) by mouth daily. 03/30/23  Yes Lewie Chamber, MD  Multiple Vitamin (MULTIVITAMIN WITH MINERALS) TABS tablet Take 1 tablet by mouth daily. 03/07/23  Yes Lockie Mola, MD  thiamine (VITAMIN B1) 100 MG tablet Take 1 tablet (100 mg total) by mouth daily. 03/07/23  Yes Lockie Mola, MD  acamprosate (CAMPRAL) 333 MG tablet Take 2 tablets (666 mg total) by mouth 3 (three) times daily with meals. Patient not taking: Reported on 04/10/2023 03/07/23   Lockie Mola, MD  chlordiazePOXIDE (LIBRIUM) 25 MG capsule Take 1 capsule 3 times a day for 1 day then 2 times a day for 1 day then daily for 1 day Patient not taking: Reported on 04/10/2023 03/30/23  Lewie Chamber, MD  folic acid (FOLVITE) 1 MG tablet Take 1 tablet (1 mg total) by mouth daily. Patient not taking: Reported on 04/10/2023 09/13/22   Cathren Harsh, MD    Physical Exam: Vitals:   04/10/23 0338 04/10/23 0508 04/10/23 0727 04/10/23 0835  BP: (!) 144/91 (!) 129/94 (!) 90/53 113/66  Pulse: 82 89 88 94  Resp: 18  16 20   Temp: 98.2 F (36.8 C)  (!) 97.5 F (36.4 C)   TempSrc: Oral  Oral   SpO2: 94%  96% 94%  Weight: 72.6 kg     Height: 5\' 7"  (1.702 m)      Physical Exam Vitals and  nursing note reviewed.  Constitutional:      General: He is awake. He is not in acute distress.    Appearance: He is overweight.  HENT:     Head: Normocephalic.     Nose: No rhinorrhea.     Mouth/Throat:     Mouth: Mucous membranes are moist.  Eyes:     General: No scleral icterus.    Pupils: Pupils are equal, round, and reactive to light.  Neck:     Vascular: No JVD.  Cardiovascular:     Rate and Rhythm: Normal rate and regular rhythm.     Heart sounds: S1 normal and S2 normal.  Pulmonary:     Effort: Pulmonary effort is normal.     Breath sounds: Normal breath sounds. No wheezing, rhonchi or rales.  Abdominal:     General: Bowel sounds are normal.     Palpations: Abdomen is soft.  Musculoskeletal:     Cervical back: Neck supple.     Right lower leg: No edema.     Left lower leg: No edema.  Skin:    General: Skin is warm and dry.  Neurological:     General: No focal deficit present.     Mental Status: He is alert and oriented to person, place, and time.     Motor: Tremor present.  Psychiatric:        Mood and Affect: Mood is anxious.        Behavior: Behavior is cooperative.     Data Reviewed:  There are no new results to review at this time.  Assessment and Plan: Principal Problem:   Alcohol withdrawal seizure at home In the setting of:   Alcohol withdrawal (HCC)   Alcohol intoxication (HCC) Observation/PCU. CIWA protocol with lorazepam. Magnesium sulfate supplementation. Folate, MVI and thiamine. Consult TOC team. Alcohol cessation advised. Cerebral atrophy finding discussed. Transaminitis and leukopenia findings discussed.  Active Problems:   Primary hypertension Resume lisinopril 30 mg p.o. daily. Monitor blood pressure.    Transaminitis In the setting of alcohol abuse.    Leukopenia Likely secondary to alcohol abuse. Follow 5 blood cell count in the morning.    Elevated serum creatinine Does not meet criteria for AKI. Time-limited IV  fluids. Follow-up renal function in the morning.     Advance Care Planning:   Code Status: Full Code   Consults:   Family Communication:   Severity of Illness: The appropriate patient status for this patient is OBSERVATION. Observation status is judged to be reasonable and necessary in order to provide the required intensity of service to ensure the patient's safety. The patient's presenting symptoms, physical exam findings, and initial radiographic and laboratory data in the context of their medical condition is felt to place them at decreased risk for further clinical deterioration. Furthermore, it is  anticipated that the patient will be medically stable for discharge from the hospital within 2 midnights of admission.   Author: Bobette Mo, MD 04/10/2023 9:58 AM  For on call review [email or web address].   This document was prepared using Dragon voice recognition software and may contain some unintended transcription errors.

## 2023-04-10 NOTE — Plan of Care (Signed)

## 2023-04-10 NOTE — ED Provider Notes (Signed)
[place] EMERGENCY DEPARTMENT AT [facility] Provider Note   CSN: [id number] Arrival date & time: 04/10/23  4098     History  Chief Complaint  Patient presents with   Withdrawal    Adam [name]. is a 37 y.o. male.  The history is provided by the patient and medical records.  Adam Peoples Terhaar Montez [name]. is a 37 y.o. male who presents to the Emergency Department complaining of alcohol withdrawal.  He presents to the emergency department due to concern for alcohol withdrawal.  He states that he started detoxing on Saturday with last small drink at 6 PM Saturday.  He complains of auditory and visual hallucinations.  His hallucinations are foggy vision and hearing loud voices.  The voices sound crazy but he cannot hear or describe what they are saying.  He has a history of alcohol abuse and when he drinks heavily he drinks about 1/5 or more daily.  He was recently admitted to the hospital and discharged on May 8 with naltrexone and Librium.  He states that he tried these medications but did not feel like it was working because he was still having alcohol cravings.  He started drinking a few days after he left the hospital.  He also reports that around 8:00 in the evening he had a seizure.  He cannot provide any additional history regarding this.  He lives at home with his wife and 3 children.  Has a hx/o HTN.  Lives with wife and three kids.  No tobacco, no drugs.      Home Medications Prior to Admission medications   Medication Sig Start Date End Date Taking? Authorizing Provider  acamprosate (CAMPRAL) 333 MG tablet Take 2 tablets (666 mg total) by mouth 3 (three) times daily with meals. 03/07/23   Lockie Mola, MD  chlordiazePOXIDE (LIBRIUM) 25 MG capsule Take 1 capsule 3 times a day for 1 day then 2 times a day for 1 day then daily for 1 day 03/30/23   Lewie Chamber, MD  folic acid (FOLVITE) 1 MG tablet Take 1 tablet (1 mg total) by mouth daily. 09/13/22   Rai,  Ripudeep K, MD  lisinopril (ZESTRIL) 30 MG tablet Take 1 tablet (30 mg total) by mouth daily. 03/30/23   Lewie Chamber, MD  Multiple Vitamin (MULTIVITAMIN WITH MINERALS) TABS tablet Take 1 tablet by mouth daily. 03/07/23   Lockie Mola, MD  thiamine (VITAMIN B1) 100 MG tablet Take 1 tablet (100 mg total) by mouth daily. 03/07/23   Lockie Mola, MD      Allergies    Bee venom and Lovenox [enoxaparin]    Review of Systems   Review of Systems  All other systems reviewed and are negative.   Physical Exam Updated Vital Signs BP (!) 144/91 (BP Location: Left Arm)   Pulse 82   Temp 98.2 F (36.8 C) (Oral)   Resp 18   Ht 5\' 7"  (1.702 m)   Wt 72.6 kg   SpO2 94%   BMI 25.06 kg/m  Physical Exam Vitals and nursing note reviewed.  Constitutional:      Appearance: He is well-developed.  HENT:     Head: Normocephalic and atraumatic.  Cardiovascular:     Rate and Rhythm: Normal rate and regular rhythm.  Pulmonary:     Effort: Pulmonary effort is normal.     Breath sounds: Normal breath sounds.  Abdominal:     Palpations: Abdomen is soft.     Tenderness: There is no  abdominal tenderness. There is no guarding or rebound.  Musculoskeletal:        General: No swelling or tenderness.  Skin:    General: Skin is warm and dry.  Neurological:     Mental Status: He is alert and oriented to person, place, and time.     Comments: No asymmetry of facial movements.  5 out of 5 strength in all 4 extremities   Psychiatric:        Behavior: Behavior normal.     ED Results / Procedures / Treatments   Labs (all labs ordered are listed, but only abnormal results are displayed) Labs Reviewed - No data to display  EKG None  Radiology No results found.  Procedures Procedures    Medications Ordered in ED Medications - No data to display  ED Course/ Medical Decision Making/ A&P                             Medical Decision Making Amount and/or Complexity of Data Reviewed Labs:  ordered. Radiology: ordered.  Risk OTC drugs. Prescription drug management.   Patient with history of hypertension and alcohol use disorder here for evaluation of what he describes as alcohol withdrawal.  On examination he is not clinically in withdrawal.  He is very calm without tremors or diaphoresis.  He states he is having hallucinations but does not appear to be responding to internal stimuli.  Labs returned with significantly elevated alcohol and suspect that his symptoms are more related to alcohol intoxication.  Patient care transferred pending metabolization of EtOH for repeat evaluation.        Final Clinical Impression(s) / ED Diagnoses Final diagnoses:  None    Rx / DC Orders ED Discharge Orders     None         Tilden Fossa, MD 04/10/23 0740

## 2023-04-10 NOTE — ED Notes (Signed)
ED TO INPATIENT HANDOFF REPORT  Name/Age/Gender Adam Peoples Lunsford Jr. 37 y.o. male  Code Status    Code Status Orders  (From admission, onward)           Start     Ordered   04/10/23 1031  Full code  Continuous       Question:  By:  Answer:  Consent: discussion documented in EHR   04/10/23 1031           Code Status History     Date Active Date Inactive Code Status Order ID Comments User Context   03/28/2023 2107 03/30/2023 1547 Full Code [id number]  Briscoe Deutscher, MD Inpatient   03/28/2023 0642 03/28/2023 2024 Full Code [id number]  Darlin Drop, DO ED   03/04/2023 1211 03/07/2023 2030 Full Code [id number]  Cora Collum, DO ED   01/07/2023 0911 01/08/2023 0048 Full Code [id number]  Lucile Shutters, MD ED   12/15/2022 0952 12/16/2022 1245 Full Code [id number]  Marinda Elk, MD ED   12/10/2022 1458 12/12/2022 0638 Full Code [id number]  Drema Dallas, MD ED   09/11/2022 2324 09/13/2022 2009 Full Code [id number]  Roxy Horseman, PA-C ED   08/30/2022 2006 09/02/2022 2130 Full Code [id number]  Briscoe Deutscher, MD ED   05/25/2022 0055 05/25/2022 2111 Full Code [id number]  Angie Fava, DO ED   04/24/2022 0409 04/25/2022 1931 Full Code [id number]  Mansy, Vernetta Honey, MD ED   03/18/2022 2104 03/20/2022 1648 Full Code [id number]  Gerhard Munch, MD ED   01/29/2020 0135 01/30/2020 1906 Full Code [id number]  Therisa Doyne, MD ED   01/21/2020 2142 01/22/2020 2252 Full Code [id number]  Levora Dredge, MD ED   11/10/2019 0612 11/13/2019 1624 Full Code [id number]  John Giovanni, MD ED   11/10/2019 0500 11/10/2019 0612 Full Code [id number]  Shon Baton, MD ED   09/24/2019 0105 09/27/2019 1551 Full Code [id number]  Eduard Clos, MD ED   04/28/2019 0513 04/30/2019 1854 Full Code [id number]  Berna Bue, MD ED       Home/SNF/Other Home  Chief Complaint Alcohol withdrawal [facility]) [F10.939]  Level of Care/Admitting Diagnosis ED Disposition     ED Disposition  Admit    Condition  --   Comment  Hospital Area: [facility] [[id number]]  Level of Care: Progressive [102]  Admit to Progressive based on following criteria: MULTISYSTEM THREATS such as stable sepsis, metabolic/electrolyte imbalance with or without encephalopathy that is responding to early treatment.  May place patient in observation at [facility] or [facility] if equivalent level of care is available:: No  Covid Evaluation: Asymptomatic - no recent exposure (last 10 days) testing not required  Diagnosis: Alcohol withdrawal (HCC) [291.81.ICD-9-CM]  Admitting Physician: Bobette Mo [[id number]]  Attending Physician: Bobette Mo [[id number]]          Medical History Past Medical History:  Diagnosis Date   Alcohol abuse    Eating disorder    history of bulemia   HYPERTENSION 06/25/2010   Seizure (HCC)    ETOH Related Seizure    Allergies Allergies  Allergen Reactions   Bee Venom Anaphylaxis    IV Location/Drains/Wounds Patient Lines/Drains/Airways Status     Active Line/Drains/Airways     Name Placement date Placement time Site Days   Peripheral IV 04/10/23 20 G Anterior;Proximal;Right Forearm 04/10/23  0518  Forearm  less than 1            Labs/Imaging Results for orders  placed or performed during the hospital encounter of 04/10/23 (from the past 48 hour(s))  Comprehensive metabolic panel     Status: Abnormal   Collection Time: 04/10/23  5:02 AM  Result Value Ref Range   Sodium 138 135 - 145 mmol/L   Potassium 4.1 3.5 - 5.1 mmol/L   Chloride 100 98 - 111 mmol/L   CO2 26 22 - 32 mmol/L   Glucose, Bld 107 (H) 70 - 99 mg/dL    Comment: Glucose reference range applies only to samples taken after fasting for at least 8 hours.   BUN 14 6 - 20 mg/dL   Creatinine, Ser 4.09 (H) 0.61 - 1.24 mg/dL   Calcium 9.1 8.9 - 81.1 mg/dL   Total Protein 7.8 6.5 - 8.1 g/dL   Albumin 4.8 3.5 - 5.0 g/dL   AST 914 (H) 15 - 41 U/L   ALT 134 (H) 0 - 44 U/L    Alkaline Phosphatase 64 38 - 126 U/L   Total Bilirubin 0.7 0.3 - 1.2 mg/dL   GFR, Estimated >78 >29 mL/min    Comment: (NOTE) Calculated using the CKD-EPI Creatinine Equation (2021)    Anion gap 12 5 - 15    Comment: Performed at [facility], [address] [place], [place] [address]  CBC with Differential     Status: Abnormal   Collection Time: 04/10/23  5:02 AM  Result Value Ref Range   WBC 3.8 (L) 4.0 - 10.5 K/uL   RBC 4.22 4.22 - 5.81 MIL/uL   Hemoglobin 14.1 13.0 - 17.0 g/dL   HCT 08.6 57.8 - 46.9 %   MCV 99.1 80.0 - 100.0 fL   MCH 33.4 26.0 - 34.0 pg   MCHC 33.7 30.0 - 36.0 g/dL   RDW 62.9 52.8 - 41.3 %   Platelets 244 150 - 400 K/uL   nRBC 0.0 0.0 - 0.2 %   Neutrophils Relative % 39 %   Neutro Abs 1.5 (L) 1.7 - 7.7 K/uL   Lymphocytes Relative 53 %   Lymphs Abs 1.9 0.7 - 4.0 K/uL   Monocytes Relative 7 %   Monocytes Absolute 0.3 0.1 - 1.0 K/uL   Eosinophils Relative 0 %   Eosinophils Absolute 0.0 0.0 - 0.5 K/uL   Basophils Relative 1 %   Basophils Absolute 0.1 0.0 - 0.1 K/uL   Immature Granulocytes 0 %   Abs Immature Granulocytes 0.00 0.00 - 0.07 K/uL    Comment: Performed at [facility], [address], [place], [place] [address]  Magnesium     Status: None   Collection Time: 04/10/23  5:02 AM  Result Value Ref Range   Magnesium 1.9 1.7 - 2.4 mg/dL    Comment: Performed at [facility], [address], [place], [place] [address]  Ethanol     Status: Abnormal   Collection Time: 04/10/23  5:16 AM  Result Value Ref Range   Alcohol, Ethyl (B) 331 (HH) <10 mg/dL    Comment: CRITICAL RESULT CALLED TO, READ BACK BY AND VERIFIED WITH KELSEY,A AT 0559 ON 04/10/23 BY LUZOLOP (NOTE) Lowest detectable limit for serum alcohol is 10 mg/dL.  For medical purposes only. Performed at [facility], [address], [place], [place] [address]    CT Head Wo Contrast  Result Date: 04/10/2023 CLINICAL  DATA:  37 year old male with altered mental status. Withdrawal. EXAM: CT HEAD WITHOUT CONTRAST TECHNIQUE: Contiguous axial images were obtained from the base of the  skull through the vertex without intravenous contrast. RADIATION DOSE REDUCTION: This exam was performed according to the departmental dose-optimization program which includes automated exposure control, adjustment of the mA and/or kV according to patient size and/or use of iterative reconstruction technique. COMPARISON:  Head CTs 03/06/2023 and earlier.  Brain MRI 04/25/2022. FINDINGS: Brain: Stable cerebral volume. No midline shift, ventriculomegaly, mass effect, evidence of mass lesion, intracranial hemorrhage or evidence of cortically based acute infarction. Gray-white matter differentiation is within normal limits throughout the brain. Mildly asymmetric left lateral ventricular choroid appears stable over this series of exams since 2020, likely normal variant. Generalized cerebral volume loss over that expected for age again noted. Vascular: No suspicious intracranial vascular hyperdensity. Skull: Intact. Sinuses/Orbits: Visualized paranasal sinuses and mastoids are stable and well aerated. Other: Visualized orbits and scalp soft tissues are within normal limits. IMPRESSION: No acute intracranial abnormality.  Cerebral Atrophy (ICD10-G31.9). Electronically Signed   By: Odessa Fleming M.D.   On: 04/10/2023 05:25    Pending Labs Unresulted Labs (From admission, onward)     Start     Ordered   04/11/23 0500  CBC  Tomorrow morning,   R        04/10/23 1031   04/11/23 0500  Comprehensive metabolic panel  Tomorrow morning,   R        04/10/23 1031            Vitals/Pain Today's Vitals   04/10/23 0727 04/10/23 0835 04/10/23 1049 04/10/23 1123  BP: (!) 90/53 113/66 132/88   Pulse: 88 94 85   Resp: 16 20    Temp: (!) 97.5 F (36.4 C)   97.6 F (36.4 C)  TempSrc: Oral   Oral  SpO2: 96% 94%    Weight:      Height:      PainSc:         Isolation Precautions No active isolations  Medications Medications  thiamine (VITAMIN B1) tablet 100 mg ( Oral See Alternative 04/10/23 1107)    Or  thiamine (VITAMIN B1) injection 100 mg (100 mg Intravenous Given 04/10/23 1107)  carbamide peroxide (DEBROX) 6.5 % OTIC (EAR) solution 10 drop (has no administration in time range)  magnesium sulfate IVPB 2 g 50 mL (2 g Intravenous New Bag/Given 04/10/23 1116)  LORazepam (ATIVAN) tablet 1-4 mg (has no administration in time range)    Or  LORazepam (ATIVAN) tablet 1 mg (has no administration in time range)  LORazepam (ATIVAN) tablet 0-4 mg (has no administration in time range)    Followed by  LORazepam (ATIVAN) tablet 0-4 mg (has no administration in time range)  acetaminophen (TYLENOL) tablet 650 mg (has no administration in time range)    Or  acetaminophen (TYLENOL) suppository 650 mg (has no administration in time range)  ondansetron (ZOFRAN) tablet 4 mg (has no administration in time range)    Or  ondansetron (ZOFRAN) injection 4 mg (has no administration in time range)  ondansetron (ZOFRAN) injection 4 mg (4 mg Intravenous Given 04/10/23 1103)  diazepam (VALIUM) injection 10 mg (10 mg Intravenous Given 04/10/23 1109)    Mobility walks

## 2023-04-10 NOTE — TOC Initial Note (Signed)
Transition of Care (TOC) - Initial/Assessment Note    Patient Details  Name: Adam [name] Services. MRN: [id number] Date of Birth: 1986-01-20  Transition of Care [facility]) CM/SW Contact:    Lanier Clam, RN Phone Number: 04/10/2023, 3:54 PM  Clinical Narrative: Provided SA Resources to AVS.                  Expected Discharge Plan: Home/Self Care Barriers to Discharge: Continued Medical Work up   Patient Goals and CMS Choice Patient states their goals for this hospitalization and ongoing recovery are:: Home CMS Medicare.gov Compare Post Acute Care list provided to:: Patient Choice offered to / list presented to : Patient [place] ownership interest in [facility].provided to:: Patient    Expected Discharge Plan and Services   Discharge Planning Services: CM Consult                                          Prior Living Arrangements/Services     Patient language and need for interpreter reviewed:: Yes Do you feel safe going back to the place where you live?: Yes      Need for Family Participation in Patient Care: Yes (Comment) Care giver support system in place?: Yes (comment)   Criminal Activity/Legal Involvement Pertinent to Current Situation/Hospitalization: No - Comment as needed  Activities of Daily Living Home Assistive Devices/Equipment: None ADL Screening (condition at time of admission) Patient's cognitive ability adequate to safely complete daily activities?: Yes Is the patient deaf or have difficulty hearing?: No Does the patient have difficulty seeing, even when wearing glasses/contacts?: No Does the patient have difficulty concentrating, remembering, or making decisions?: No Patient able to express need for assistance with ADLs?: Yes Does the patient have difficulty dressing or bathing?: No Independently performs ADLs?: Yes (appropriate for developmental age) Does the patient have difficulty walking or climbing stairs?: No Weakness  of Legs: None Weakness of Arms/Hands: None  Permission Sought/Granted Permission sought to share information with : Case Manager Permission granted to share information with : Yes, Verbal Permission Granted  Share Information with NAME: Case Manager           Emotional Assessment Appearance:: Appears stated age Attitude/Demeanor/Rapport: Gracious Affect (typically observed): Accepting Orientation: : Oriented to Self, Oriented to Place, Oriented to  Time, Oriented to Situation Alcohol / Substance Use: Alcohol Use Psych Involvement: No (comment)  Admission diagnosis:  Alcohol withdrawal (HCC) [F10.939] Alcohol withdrawal syndrome without complication (HCC) [F10.930] Patient Active Problem List   Diagnosis Date Noted   Leukopenia 04/10/2023   Alcohol intoxication (HCC) 04/10/2023   Elevated serum creatinine 04/10/2023   Alcohol use disorder 03/05/2023   Acute hyperactive alcohol withdrawal delirium (HCC) 01/07/2023   Alcohol withdrawal delirium, acute, hyperactive (HCC) 12/15/2022   AKI (acute kidney injury) (HCC)    Lactic acidosis    Seizures (HCC) 05/25/2022   Avulsion fracture of ankle 05/25/2022   Chronic alcohol abuse 04/24/2022   Acute alcoholic hallucinosis (HCC) 04/24/2022   Transaminitis 04/24/2022   Anxiety 06/27/2021   Alcohol withdrawal delirium (HCC) 01/29/2020   Alcohol withdrawal seizure with complication, with unspecified complication (HCC) 01/28/2020   Seizure (HCC) 01/21/2020   Alcohol withdrawal seizure (HCC) 11/10/2019   Benzodiazepine withdrawal (HCC) 11/10/2019   Hypokalemia 11/10/2019   Adjustment disorder with depressed mood 11/10/2019   Alcohol withdrawal (HCC) 09/24/2019   Chest pain 09/24/2019   Alcohol  withdrawal seizure without complication (HCC)    Pneumothorax 04/28/2019   Primary hypertension 06/25/2010   PCP:  Pcp, No Pharmacy:   [facility] [address] [place] [address] Phone: [phone]  Fax: [phone]  [facility] [address], [place] - [address] [place] [address] Phone: [phone] Fax: [phone]     Social Determinants of Health (SDOH) Social History: SDOH Screenings   Food Insecurity: No Food Insecurity (04/10/2023)  Housing: Low Risk  (04/10/2023)  Transportation Needs: No Transportation Needs (04/10/2023)  Utilities: Not At Risk (04/10/2023)  Alcohol Screen: Medium Risk (04/29/2019)  Tobacco Use: High Risk (04/10/2023)   SDOH Interventions:     Readmission Risk Interventions     No data to display

## 2023-04-11 DIAGNOSIS — G4089 Other seizures: Secondary | ICD-10-CM | POA: Diagnosis present

## 2023-04-11 DIAGNOSIS — Z5329 Procedure and treatment not carried out because of patient's decision for other reasons: Secondary | ICD-10-CM | POA: Diagnosis not present

## 2023-04-11 DIAGNOSIS — F10930 Alcohol use, unspecified with withdrawal, uncomplicated: Secondary | ICD-10-CM

## 2023-04-11 DIAGNOSIS — K7010 Alcoholic hepatitis without ascites: Secondary | ICD-10-CM | POA: Diagnosis present

## 2023-04-11 DIAGNOSIS — K760 Fatty (change of) liver, not elsewhere classified: Secondary | ICD-10-CM | POA: Diagnosis present

## 2023-04-11 DIAGNOSIS — F10139 Alcohol abuse with withdrawal, unspecified: Secondary | ICD-10-CM | POA: Diagnosis present

## 2023-04-11 DIAGNOSIS — I10 Essential (primary) hypertension: Secondary | ICD-10-CM | POA: Diagnosis present

## 2023-04-11 DIAGNOSIS — Z87891 Personal history of nicotine dependence: Secondary | ICD-10-CM | POA: Diagnosis not present

## 2023-04-11 DIAGNOSIS — Y908 Blood alcohol level of 240 mg/100 ml or more: Secondary | ICD-10-CM | POA: Diagnosis present

## 2023-04-11 DIAGNOSIS — F10129 Alcohol abuse with intoxication, unspecified: Secondary | ICD-10-CM | POA: Diagnosis present

## 2023-04-11 DIAGNOSIS — Z888 Allergy status to other drugs, medicaments and biological substances status: Secondary | ICD-10-CM | POA: Diagnosis not present

## 2023-04-11 DIAGNOSIS — D72818 Other decreased white blood cell count: Secondary | ICD-10-CM | POA: Diagnosis present

## 2023-04-11 DIAGNOSIS — F10151 Alcohol abuse with alcohol-induced psychotic disorder with hallucinations: Secondary | ICD-10-CM | POA: Diagnosis present

## 2023-04-11 DIAGNOSIS — R7989 Other specified abnormal findings of blood chemistry: Secondary | ICD-10-CM | POA: Diagnosis present

## 2023-04-11 DIAGNOSIS — F10939 Alcohol use, unspecified with withdrawal, unspecified: Secondary | ICD-10-CM | POA: Diagnosis not present

## 2023-04-11 DIAGNOSIS — Z8249 Family history of ischemic heart disease and other diseases of the circulatory system: Secondary | ICD-10-CM | POA: Diagnosis not present

## 2023-04-11 DIAGNOSIS — Z8659 Personal history of other mental and behavioral disorders: Secondary | ICD-10-CM | POA: Diagnosis not present

## 2023-04-11 DIAGNOSIS — G319 Degenerative disease of nervous system, unspecified: Secondary | ICD-10-CM | POA: Diagnosis present

## 2023-04-11 DIAGNOSIS — Z79899 Other long term (current) drug therapy: Secondary | ICD-10-CM | POA: Diagnosis not present

## 2023-04-11 LAB — CBC
HCT: 38.9 % — ABNORMAL LOW (ref 39.0–52.0)
Hemoglobin: 13.2 g/dL (ref 13.0–17.0)
MCH: 34 pg (ref 26.0–34.0)
MCHC: 33.9 g/dL (ref 30.0–36.0)
MCV: 100.3 fL — ABNORMAL HIGH (ref 80.0–100.0)
Platelets: 178 10*3/uL (ref 150–400)
RBC: 3.88 MIL/uL — ABNORMAL LOW (ref 4.22–5.81)
RDW: 12.2 % (ref 11.5–15.5)
WBC: 2.9 10*3/uL — ABNORMAL LOW (ref 4.0–10.5)
nRBC: 0 % (ref 0.0–0.2)

## 2023-04-11 LAB — COMPREHENSIVE METABOLIC PANEL
ALT: 127 U/L — ABNORMAL HIGH (ref 0–44)
AST: 180 U/L — ABNORMAL HIGH (ref 15–41)
Albumin: 4.1 g/dL (ref 3.5–5.0)
Alkaline Phosphatase: 59 U/L (ref 38–126)
Anion gap: 10 (ref 5–15)
BUN: 12 mg/dL (ref 6–20)
CO2: 23 mmol/L (ref 22–32)
Calcium: 8.7 mg/dL — ABNORMAL LOW (ref 8.9–10.3)
Chloride: 104 mmol/L (ref 98–111)
Creatinine, Ser: 1.25 mg/dL — ABNORMAL HIGH (ref 0.61–1.24)
GFR, Estimated: >60 mL/min (ref >60)
Glucose, Bld: 106 mg/dL — ABNORMAL HIGH (ref 70–99)
Potassium: 4 mmol/L (ref 3.5–5.1)
Sodium: 137 mmol/L (ref 135–145)
Total Bilirubin: 1.1 mg/dL (ref 0.3–1.2)
Total Protein: 6.7 g/dL (ref 6.5–8.1)

## 2023-04-11 MED ORDER — CHLORDIAZEPOXIDE HCL 25 MG PO CAPS: 25.0000 mg | ORAL_CAPSULE | Freq: Four times a day (QID) | ORAL | Status: DC

## 2023-04-11 MED ORDER — CHLORDIAZEPOXIDE HCL 25 MG PO CAPS: 25.0000 mg | ORAL_CAPSULE | Freq: Every day | ORAL | Status: DC

## 2023-04-11 MED ORDER — CHLORDIAZEPOXIDE HCL 25 MG PO CAPS
25.0000 mg | ORAL_CAPSULE | Freq: Four times a day (QID) | ORAL | Status: AC
  Administered 2023-04-11 (×4): 25 mg via ORAL
  Filled 2023-04-11 (×4): qty 1

## 2023-04-11 MED ORDER — CHLORDIAZEPOXIDE HCL 25 MG PO CAPS: 25.0000 mg | ORAL_CAPSULE | ORAL | Status: DC

## 2023-04-11 MED ORDER — CHLORDIAZEPOXIDE HCL 25 MG PO CAPS
25.0000 mg | ORAL_CAPSULE | Freq: Three times a day (TID) | ORAL | Status: DC
Start: 2023-04-12 — End: 2023-04-11

## 2023-04-11 MED ORDER — CHLORDIAZEPOXIDE HCL 25 MG PO CAPS: 25.0000 mg | ORAL_CAPSULE | Freq: Three times a day (TID) | ORAL | Status: DC

## 2023-04-11 NOTE — Plan of Care (Signed)
  Problem: Education: Goal: Knowledge of General Education information will improve Description: Including pain rating scale, medication(s)/side effects and non-pharmacologic comfort measures Outcome: Progressing   Problem: Health Behavior/Discharge Planning: Goal: Ability to manage health-related needs will improve Outcome: Progressing   Problem: Nutrition: Goal: Adequate nutrition will be maintained Outcome: Progressing   Problem: Clinical Measurements: Goal: Ability to maintain clinical measurements within normal limits will improve Outcome: Adequate for Discharge Goal: Will remain free from infection Outcome: Adequate for Discharge   Problem: Activity: Goal: Risk for activity intolerance will decrease Outcome: Adequate for Discharge   Problem: Safety: Goal: Ability to remain free from injury will improve Outcome: Adequate for Discharge   Problem: Skin Integrity: Goal: Risk for impaired skin integrity will decrease Outcome: Adequate for Discharge

## 2023-04-11 NOTE — Hospital Course (Signed)
37 y.o. male with medical history significant of bulimia, hypertension, alcohol abuse, alcohol related seizure who was recently admitted from 03/28/2023 until 03/30/2023 due to acute encephalopathy secondary to alcohol withdrawal who is coming to the emergency department stating that he would like to stop drinking alcohol.  He stated that he had a seizure last night witnessed by his wife.  He has been having tinnitus, visual and auditory hallucinations.  Denied SI or HI.  He has been drinking 1.5 to 2  fifths of liquor every day.  He denied fever, chills, rhinorrhea, sore throat, wheezing or hemoptysis.  No chest pain, palpitations, diaphoresis, PND, orthopnea or pitting edema of the lower extremities.  No abdominal pain, nausea, emesis, diarrhea, constipation, melena or hematochezia.  No flank pain, dysuria, frequency or hematuria.  No polyuria, polydipsia, polyphagia or blurred vision.

## 2023-04-11 NOTE — Progress Notes (Signed)
  Progress Note   Patient: Adam [name] [name]. ZOX:[id number] DOB: 23-Mar-1986 DOA: 04/10/2023     0 DOS: the patient was seen and examined on 04/11/2023   Brief hospital course: 37 y.o. male with medical history significant of bulimia, hypertension, alcohol abuse, alcohol related seizure who was recently admitted from 03/28/2023 until 03/30/2023 due to acute encephalopathy secondary to alcohol withdrawal who is coming to the emergency department stating that he would like to stop drinking alcohol.  He stated that he had a seizure last night witnessed by his wife.  He has been having tinnitus, visual and auditory hallucinations.  Denied SI or HI.  He has been drinking 1.5 to 2  fifths of liquor every day.  He denied fever, chills, rhinorrhea, sore throat, wheezing or hemoptysis.  No chest pain, palpitations, diaphoresis, PND, orthopnea or pitting edema of the lower extremities.  No abdominal pain, nausea, emesis, diarrhea, constipation, melena or hematochezia.  No flank pain, dysuria, frequency or hematuria.  No polyuria, polydipsia, polyphagia or blurred vision.    Assessment and Plan: Principal Problem:   Alcohol withdrawal seizure at home In the setting of:   Alcohol withdrawal (HCC)   Alcohol intoxication (HCC) -CIWA as high as 30. -continue on CIWA protocol with lorazepam. -Have added librium taper -TOC consulted -Head CT reviewd, neg Alcohol cessation advised.  Alcoholic hepatitis -Elevated LFT's noted with AST>ALT -LFT's trending down -Will need to abstain from ETOH -Recent RUQ [place] reviewed, hepatic steatosis noted   Active Problems:   Primary hypertension Continue lisinopril 30 mg p.o. daily. Monitor blood pressure.     Transaminitis In the setting of alcohol abuse.     Leukopenia Likely secondary to alcohol abuse. Recheck cbc in AM Afebrile     Elevated serum creatinine Does not meet criteria for AKI. Given time-limited IV fluids. Cr improved -Recheck bmet in  AM   Subjective: Pt feeling tired, otherwise without complaints  Physical Exam: Vitals:   04/11/23 0013 04/11/23 0411 04/11/23 0730 04/11/23 1213  BP: 131/80 136/78 (!) 141/97 106/74  Pulse: 91 94 85 86  Resp: 18 18 16  (!) 22  Temp: 97.6 F (36.4 C) 98.1 F (36.7 C) 98.1 F (36.7 C) (!) 97.5 F (36.4 C)  TempSrc: Oral Oral Oral Oral  SpO2: 95% 98% 97% 95%  Weight:      Height:       General exam: Awake, laying in bed, in nad Respiratory system: Normal respiratory effort, no wheezing Cardiovascular system: regular rate, s1, s2 Gastrointestinal system: Soft, nondistended, positive BS Central nervous system: CN2-12 grossly intact, strength intact Extremities: Perfused, no clubbing Skin: Normal skin turgor, no notable skin lesions seen Psychiatry: Mood normal // no visual hallucinations   Data Reviewed:  Na 137, K 4.0, Cr 1.25, Hgb 13.2  Family Communication: Pt in room, family not at bedside  Disposition: Status is: Observation The patient will require care spanning > 2 midnights and should be moved to inpatient because: Severity of illness  Planned Discharge Destination: Home    Author: Rickey Barbara, MD 04/11/2023 3:05 PM  For on call review [email or web address].

## 2023-04-12 ENCOUNTER — Other Ambulatory Visit (HOSPITAL_COMMUNITY): Payer: Self-pay

## 2023-04-12 MED ORDER — ALUM & MAG HYDROXIDE-SIMETH 200-200-20 MG/5ML PO SUSP
30.0000 mL | ORAL | Status: DC | PRN
  Administered 2023-04-12: 30 mL via ORAL
  Filled 2023-04-12: qty 30

## 2023-04-12 NOTE — Progress Notes (Signed)
Witnessed patient fully dressed with personal belongings. Attempted to persuade patient to return to room. Patient adamant about leaving. Patient ran down stairwell and exited building.

## 2023-04-12 NOTE — Discharge Summary (Signed)
Physician Discharge Summary   Patient: Adam Michaelsen Aye Jr. MRN: [id number] DOB: 1986/03/27  Admit date:     04/10/2023  Discharge date: 04/12/23  Discharge Physician: Rickey Barbara   Patient Left Against Medical Advice  Discharge Diagnoses: Principal Problem:   Alcohol withdrawal [facility]) Active Problems:   Alcohol withdrawal seizure (HCC)   Primary hypertension   Transaminitis   Leukopenia   Alcohol intoxication (HCC)   Elevated serum creatinine  Resolved Problems:   * No resolved hospital problems. *  Hospital Course: 37 y.o. male with medical history significant of bulimia, hypertension, alcohol abuse, alcohol related seizure who was recently admitted from 03/28/2023 until 03/30/2023 due to acute encephalopathy secondary to alcohol withdrawal who is coming to the emergency department stating that he would like to stop drinking alcohol.  He stated that he had a seizure last night witnessed by his wife.  He has been having tinnitus, visual and auditory hallucinations.  Denied SI or HI.  He has been drinking 1.5 to 2  fifths of liquor every day.  He denied fever, chills, rhinorrhea, sore throat, wheezing or hemoptysis.  No chest pain, palpitations, diaphoresis, PND, orthopnea or pitting edema of the lower extremities.  No abdominal pain, nausea, emesis, diarrhea, constipation, melena or hematochezia.  No flank pain, dysuria, frequency or hematuria.  No polyuria, polydipsia, polyphagia or blurred vision.     Assessment and Plan: Principal Problem:   Alcohol withdrawal seizure at home In the setting of:   Alcohol withdrawal (HCC)   Alcohol intoxication (HCC) -CIWA as high as 30. -Patient was continued on CIWA protocol with lorazepam with librium taper -TOC was consulted -Head CT reviewd, neg   Alcoholic hepatitis -Elevated LFT's noted with AST>ALT -LFT's trending down -Will need to abstain from ETOH -Recent RUQ [place] reviewed, hepatic steatosis noted   Active Problems:    Primary hypertension Continued lisinopril 30 mg p.o. daily. Monitor blood pressure.     Transaminitis In the setting of alcohol abuse.     Leukopenia Likely secondary to alcohol abuse.     Elevated serum creatinine Does not meet criteria for AKI. Given time-limited IV fluids. Cr improved  Chart reviewed. On the early morning of 5/21, patient elected to leave against medical advice. See overnight documentation     DISCHARGE MEDICATION: Allergies as of 04/12/2023       Reactions   Bee Venom Anaphylaxis        Medication List     ASK your doctor about these medications    acamprosate 333 MG tablet Commonly known as: CAMPRAL Take 2 tablets (666 mg total) by mouth 3 (three) times daily with meals.   chlordiazePOXIDE 25 MG capsule Commonly known as: LIBRIUM Take 1 capsule 3 times a day for 1 day then 2 times a day for 1 day then daily for 1 day   folic acid 1 MG tablet Commonly known as: FOLVITE Take 1 tablet (1 mg total) by mouth daily.   lisinopril 30 MG tablet Commonly known as: ZESTRIL Take 1 tablet (30 mg total) by mouth daily.   multivitamin with minerals Tabs tablet Take 1 tablet by mouth daily.   thiamine 100 MG tablet Commonly known as: VITAMIN B1 Take 1 tablet (100 mg total) by mouth daily.        Follow-up Information     [facility]. Schedule an appointment as soon as possible for a visit.   Contact information: [address]  [place] [address] [phone]               Discharge Exam: Ceasar Mons Weights   04/10/23 0338 04/10/23 1245  Weight: 72.6 kg 77.8 kg   Left against medical advice   The results of significant diagnostics from this hospitalization (including imaging, microbiology, ancillary and laboratory) are listed below for reference.   Imaging Studies: CT Head Wo Contrast  Result Date: 04/10/2023 CLINICAL DATA:  37 year old male with altered mental status.  Withdrawal. EXAM: CT HEAD WITHOUT CONTRAST TECHNIQUE: Contiguous axial images were obtained from the base of the skull through the vertex without intravenous contrast. RADIATION DOSE REDUCTION: This exam was performed according to the departmental dose-optimization program which includes automated exposure control, adjustment of the mA and/or kV according to patient size and/or use of iterative reconstruction technique. COMPARISON:  Head CTs 03/06/2023 and earlier.  Brain MRI 04/25/2022. FINDINGS: Brain: Stable cerebral volume. No midline shift, ventriculomegaly, mass effect, evidence of mass lesion, intracranial hemorrhage or evidence of cortically based acute infarction. Gray-white matter differentiation is within normal limits throughout the brain. Mildly asymmetric left lateral ventricular choroid appears stable over this series of exams since 2020, likely normal variant. Generalized cerebral volume loss over that expected for age again noted. Vascular: No suspicious intracranial vascular hyperdensity. Skull: Intact. Sinuses/Orbits: Visualized paranasal sinuses and mastoids are stable and well aerated. Other: Visualized orbits and scalp soft tissues are within normal limits. IMPRESSION: No acute intracranial abnormality.  Cerebral Atrophy (ICD10-G31.9). Electronically Signed   By: Odessa Fleming M.D.   On: 04/10/2023 05:25   EEG adult  Result Date: 03/28/2023 Charlsie Quest, MD     03/28/2023 11:59 AM Patient Name: Adam [name] MRN: [id number] Epilepsy Attending: Charlsie Quest Referring Physician/Provider: Darlin Drop, DO Date: 03/28/2023 Duration: 22.36 mins Patient history: 37 year old presenting with concern for seizure activity in the setting of acute alcohol intoxication. Level of alertness: Awake, asleep AEDs during EEG study: Ativan Technical aspects: This EEG study was done with scalp electrodes positioned according to the 10-20 International system of electrode placement. Electrical activity was  reviewed with band pass filter of 1-70Hz , sensitivity of 7 uV/mm, display speed of 41mm/sec with a 60Hz  notched filter applied as appropriate. EEG data were recorded continuously and digitally stored.  Video monitoring was available and reviewed as appropriate. Description: The posterior dominant rhythm consists of 8 Hz activity of moderate voltage (25-35 uV) seen predominantly in posterior head regions, symmetric and reactive to eye opening and eye closing. Sleep was characterized by vertex waves, sleep spindles (12 to 14 Hz), maximal frontocentral region. Hyperventilation and photic stimulation were not performed.   IMPRESSION: This study is within normal limits. No seizures or epileptiform discharges were seen throughout the recording. A normal interictal EEG does not exclude the diagnosis of epilepsy. Priyanka Annabelle Harman   US Abdomen Limited RUQ (LIVER/GB)  Result Date: 03/28/2023 CLINICAL DATA:  221910 Elevated LFTs 221910 EXAM: ULTRASOUND ABDOMEN LIMITED RIGHT UPPER QUADRANT COMPARISON:  None Available. FINDINGS: Gallbladder: No gallstones or wall thickening visualized. No sonographic Murphy sign noted by sonographer. Common bile duct: Diameter: Visualized portion measures 6 mm, within normal limits. Liver: No focal lesion identified. Increased hepatic parenchymal echogenicity with poor acoustic penetrance. Portal vein is patent on color Doppler imaging with normal direction of blood flow towards the liver. Other: None. IMPRESSION: Hepatic steatosis. Electronically Signed   By: Meda Klinefelter M.D.   On: 03/28/2023 07:53   MR BRAIN WO CONTRAST  Result Date: 03/28/2023 CLINICAL  DATA:  37 year old male status post syncope, fall down stairs. EXAM: MRI HEAD WITHOUT CONTRAST TECHNIQUE: Multiplanar, multiecho pulse sequences of the brain and surrounding structures were obtained without intravenous contrast. COMPARISON:  Plain head CT 0403 hours today. FINDINGS: Brain: Susceptibility artifact over the anterior  vertex is of unclear etiology, not correlated on the earlier CT. DWI and SWI most affected by the susceptibility artifact. Allowing for this no restricted diffusion to suggest acute infarction. No midline shift, mass effect, evidence of mass lesion, ventriculomegaly, extra-axial collection or acute intracranial hemorrhage. Cervicomedullary junction and pituitary are within normal limits. Stable cerebral volume to that described earlier. Wallace Cullens and white matter signal are within normal limits. No definite chronic cerebral blood products. No encephalomalacia identified. Vascular: Major intracranial vascular flow voids are preserved. Skull and upper cervical spine: Negative visible cervical spine. Visualized bone marrow signal is within normal limits. Sinuses/Orbits: Negative. Other: Visible internal auditory structures appear normal. Negative visible face. IMPRESSION: 1. Scalp vertex Susceptibility artifact of unclear etiology. 2. No acute intracranial abnormality. Negative noncontrast MRI appearance of the brain aside from volume loss greater than expected for age. Electronically Signed   By: Odessa Fleming M.D.   On: 03/28/2023 07:07   CT Head Wo Contrast  Result Date: 03/28/2023 CLINICAL DATA:  37 year old male status post syncope, fall down stairs. EXAM: CT HEAD WITHOUT CONTRAST TECHNIQUE: Contiguous axial images were obtained from the base of the skull through the vertex without intravenous contrast. RADIATION DOSE REDUCTION: This exam was performed according to the departmental dose-optimization program which includes automated exposure control, adjustment of the mA and/or kV according to patient size and/or use of iterative reconstruction technique. COMPARISON:  None Available. FINDINGS: Brain: No midline shift, ventriculomegaly, mass effect, evidence of mass lesion, intracranial hemorrhage or evidence of cortically based acute infarction. Gray-white matter differentiation is within normal limits throughout the  brain. But generalized decreased cerebral volume over that expected for age. Vascular: No suspicious intracranial vascular hyperdensity. Skull: Scaphocephaly, normal variant.  No fracture identified. Sinuses/Orbits: Visualized paranasal sinuses and mastoids are clear. Other: No orbit or scalp soft tissue injury identified. IMPRESSION: 1. No acute intracranial abnormality or acute traumatic injury identified. 2. Generalized brain volume loss over that expected for age, Cerebral Atrophy (ICD10-G31.9). Electronically Signed   By: Odessa Fleming M.D.   On: 03/28/2023 04:15   DG Chest 2 View  Result Date: 03/28/2023 CLINICAL DATA:  37 year old male status post syncope, fall down stairs. EXAM: CHEST - 2 VIEW COMPARISON:  Chest radiographs 09/14/2015 and earlier. FINDINGS: PA and lateral views at 0335 hours. Normal lung volumes and mediastinal contours. Visualized tracheal air column is within normal limits. Both lungs are clear. No pneumothorax or pleural effusion. No acute osseous abnormality identified. Negative visible bowel gas. IMPRESSION: No acute cardiopulmonary abnormality or acute traumatic injury identified. Electronically Signed   By: Odessa Fleming M.D.   On: 03/28/2023 04:04    Microbiology: Results for orders placed or performed during the hospital encounter of 03/04/23  MRSA Next Gen by PCR, Nasal     Status: None   Collection Time: 03/04/23  6:07 PM   Specimen: Nasal Mucosa; Nasal Swab  Result Value Ref Range Status   MRSA by PCR Next Gen NOT DETECTED NOT DETECTED Final    Comment: (NOTE) The GeneXpert MRSA Assay (FDA approved for NASAL specimens only), is one component of a comprehensive MRSA colonization surveillance program. It is not intended to diagnose MRSA infection nor to guide or monitor treatment for MRSA  infections. Test performance is not FDA approved in patients less than 45 years old. Performed at [facility], [address] [place], [place] [address]     Labs: CBC: Recent  Labs  Lab 04/10/23 0502 04/11/23 0501  WBC 3.8* 2.9*  NEUTROABS 1.5*  --   HGB 14.1 13.2  HCT 41.8 38.9*  MCV 99.1 100.3*  PLT 244 178   Basic Metabolic Panel: Recent Labs  Lab 04/10/23 0502 04/11/23 0501  NA 138 137  K 4.1 4.0  CL 100 104  CO2 26 23  GLUCOSE 107* 106*  BUN 14 12  CREATININE 1.29* 1.25*  CALCIUM 9.1 8.7*  MG 1.9  --    Liver Function Tests: Recent Labs  Lab 04/10/23 0502 04/11/23 0501  AST 156* 180*  ALT 134* 127*  ALKPHOS 64 59  BILITOT 0.7 1.1  PROT 7.8 6.7  ALBUMIN 4.8 4.1   CBG: No results for input(s): "GLUCAP" in the last 168 hours.  Discharge time spent: less than 30 minutes.  Signed: Rickey Barbara, MD [facility] 04/12/2023

## 2023-04-12 NOTE — Progress Notes (Signed)
.                                                                                                                                    Against Medical Advice Patient at this time expresses desire to leave the Hospital immediately, patient has been warned that this is not Medically advisable at this time, and can result in Medical complications like Death and Disability, patient understands and accepts the risks involved and assumes full responsibilty of this decision.  This patient has also been advised that if they feel the need for further medical assistance to return to any available ER or dial [phone].  Informed by Nursing staff that this patient has left care and has signed the form  Against Medical Advice on 04/12/2023 at 0510 hrs.  Chinita Greenland BSN MSNA MSN ACNPC-AG Acute Care Nurse Practitioner [facility]

## 2023-05-12 ENCOUNTER — Inpatient Hospital Stay (HOSPITAL_COMMUNITY)
Admission: EM | Admit: 2023-05-12 | Discharge: 2023-05-14 | DRG: 897 | Disposition: A | Discharge status: Home / Self Care | Payer: Medicaid Other | Attending: Internal Medicine | Admitting: Internal Medicine

## 2023-05-12 ENCOUNTER — Other Ambulatory Visit: Payer: Self-pay

## 2023-05-12 ENCOUNTER — Encounter (HOSPITAL_COMMUNITY): Payer: Self-pay

## 2023-05-12 DIAGNOSIS — F10930 Alcohol use, unspecified with withdrawal, uncomplicated: Principal | ICD-10-CM

## 2023-05-12 DIAGNOSIS — F10939 Alcohol use, unspecified with withdrawal, unspecified: Secondary | ICD-10-CM | POA: Diagnosis present

## 2023-05-12 DIAGNOSIS — Z720 Tobacco use: Secondary | ICD-10-CM | POA: Diagnosis not present

## 2023-05-12 DIAGNOSIS — Z8249 Family history of ischemic heart disease and other diseases of the circulatory system: Secondary | ICD-10-CM | POA: Diagnosis not present

## 2023-05-12 DIAGNOSIS — D72819 Decreased white blood cell count, unspecified: Secondary | ICD-10-CM | POA: Diagnosis present

## 2023-05-12 DIAGNOSIS — I10 Essential (primary) hypertension: Secondary | ICD-10-CM | POA: Diagnosis present

## 2023-05-12 DIAGNOSIS — R7401 Elevation of levels of liver transaminase levels: Secondary | ICD-10-CM | POA: Diagnosis present

## 2023-05-12 DIAGNOSIS — Z6826 Body mass index (BMI) 26.0-26.9, adult: Secondary | ICD-10-CM

## 2023-05-12 DIAGNOSIS — Y901 Blood alcohol level of 20-39 mg/100 ml: Secondary | ICD-10-CM | POA: Diagnosis present

## 2023-05-12 DIAGNOSIS — Z79899 Other long term (current) drug therapy: Secondary | ICD-10-CM | POA: Diagnosis not present

## 2023-05-12 DIAGNOSIS — E669 Obesity, unspecified: Secondary | ICD-10-CM | POA: Diagnosis present

## 2023-05-12 DIAGNOSIS — F10230 Alcohol dependence with withdrawal, uncomplicated: Principal | ICD-10-CM | POA: Diagnosis present

## 2023-05-12 DIAGNOSIS — D696 Thrombocytopenia, unspecified: Secondary | ICD-10-CM | POA: Diagnosis present

## 2023-05-12 DIAGNOSIS — Z801 Family history of malignant neoplasm of trachea, bronchus and lung: Secondary | ICD-10-CM

## 2023-05-12 DIAGNOSIS — F10931 Alcohol use, unspecified with withdrawal delirium: Secondary | ICD-10-CM | POA: Diagnosis not present

## 2023-05-12 DIAGNOSIS — Z91030 Bee allergy status: Secondary | ICD-10-CM

## 2023-05-12 LAB — COMPREHENSIVE METABOLIC PANEL
ALT: 90 U/L — ABNORMAL HIGH (ref 0–44)
AST: 105 U/L — ABNORMAL HIGH (ref 15–41)
Albumin: 4 g/dL (ref 3.5–5.0)
Alkaline Phosphatase: 47 U/L (ref 38–126)
Anion gap: 11 (ref 5–15)
BUN: 10 mg/dL (ref 6–20)
CO2: 20 mmol/L — ABNORMAL LOW (ref 22–32)
Calcium: 9 mg/dL (ref 8.9–10.3)
Chloride: 104 mmol/L (ref 98–111)
Creatinine, Ser: 0.94 mg/dL (ref 0.61–1.24)
GFR, Estimated: >60 mL/min (ref >60)
Glucose, Bld: 94 mg/dL (ref 70–99)
Potassium: 3.6 mmol/L (ref 3.5–5.1)
Sodium: 135 mmol/L (ref 135–145)
Total Bilirubin: 0.8 mg/dL (ref 0.3–1.2)
Total Protein: 6.3 g/dL — ABNORMAL LOW (ref 6.5–8.1)

## 2023-05-12 LAB — GLUCOSE, CAPILLARY: Glucose-Capillary: 77 mg/dL (ref 70–99)

## 2023-05-12 LAB — CBC WITH DIFFERENTIAL/PLATELET
Abs Immature Granulocytes: 0.01 10*3/uL (ref 0.00–0.07)
Basophils Absolute: 0.1 10*3/uL (ref 0.0–0.1)
Basophils Relative: 2 %
Eosinophils Absolute: 0 10*3/uL (ref 0.0–0.5)
Eosinophils Relative: 0 %
HCT: 39 % (ref 39.0–52.0)
Hemoglobin: 13.5 g/dL (ref 13.0–17.0)
Immature Granulocytes: 0 %
Lymphocytes Relative: 33 %
Lymphs Abs: 1 10*3/uL (ref 0.7–4.0)
MCH: 34.5 pg — ABNORMAL HIGH (ref 26.0–34.0)
MCHC: 34.6 g/dL (ref 30.0–36.0)
MCV: 99.7 fL (ref 80.0–100.0)
Monocytes Absolute: 0.3 10*3/uL (ref 0.1–1.0)
Monocytes Relative: 9 %
Neutro Abs: 1.8 10*3/uL (ref 1.7–7.7)
Neutrophils Relative %: 56 %
Platelets: 166 10*3/uL (ref 150–400)
RBC: 3.91 MIL/uL — ABNORMAL LOW (ref 4.22–5.81)
RDW: 12.5 % (ref 11.5–15.5)
WBC: 3.1 10*3/uL — ABNORMAL LOW (ref 4.0–10.5)
nRBC: 0 % (ref 0.0–0.2)

## 2023-05-12 LAB — RAPID URINE DRUG SCREEN, HOSP PERFORMED
Amphetamines: NOT DETECTED
Barbiturates: NOT DETECTED
Benzodiazepines: POSITIVE — AB
Cocaine: NOT DETECTED
Opiates: NOT DETECTED
Tetrahydrocannabinol: NOT DETECTED

## 2023-05-12 LAB — ETHANOL: Alcohol, Ethyl (B): 36 mg/dL — ABNORMAL HIGH (ref <10)

## 2023-05-12 MED ORDER — ONDANSETRON HCL 4 MG PO TABS: 4.0000 mg | ORAL_TABLET | Freq: Four times a day (QID) | ORAL | Status: DC | PRN

## 2023-05-12 MED ORDER — ACETAMINOPHEN 325 MG PO TABS
650.0000 mg | ORAL_TABLET | Freq: Four times a day (QID) | ORAL | Status: DC | PRN
  Administered 2023-05-12: 650 mg via ORAL
  Filled 2023-05-12: qty 2

## 2023-05-12 MED ORDER — LORAZEPAM 1 MG PO TABS
1.0000 mg | ORAL_TABLET | ORAL | Status: DC | PRN
  Administered 2023-05-13: 2 mg via ORAL
  Administered 2023-05-14: 1 mg via ORAL
  Filled 2023-05-12: qty 1
  Filled 2023-05-12: qty 2
  Filled 2023-05-12: qty 1

## 2023-05-12 MED ORDER — ADULT MULTIVITAMIN W/MINERALS CH
1.0000 | ORAL_TABLET | Freq: Every day | ORAL | Status: DC
  Administered 2023-05-12 – 2023-05-14 (×3): 1 via ORAL
  Filled 2023-05-12 (×3): qty 1

## 2023-05-12 MED ORDER — LORAZEPAM 1 MG PO TABS
0.0000 mg | ORAL_TABLET | Freq: Four times a day (QID) | ORAL | Status: DC
  Administered 2023-05-12: 2 mg via ORAL
  Filled 2023-05-12: qty 2

## 2023-05-12 MED ORDER — POLYETHYLENE GLYCOL 3350 17 G PO PACK: 17.0000 g | PACK | Freq: Every day | ORAL | Status: DC | PRN

## 2023-05-12 MED ORDER — DIAZEPAM 5 MG PO TABS
5.0000 mg | ORAL_TABLET | Freq: Four times a day (QID) | ORAL | Status: DC
  Administered 2023-05-12 – 2023-05-13 (×5): 5 mg via ORAL
  Filled 2023-05-12 (×5): qty 1

## 2023-05-12 MED ORDER — SODIUM CHLORIDE 0.9 % IV SOLN: INTRAVENOUS | Status: DC

## 2023-05-12 MED ORDER — LORAZEPAM 1 MG PO TABS: 0.0000 mg | ORAL_TABLET | Freq: Two times a day (BID) | ORAL | Status: DC

## 2023-05-12 MED ORDER — LACTATED RINGERS IV SOLN: INTRAVENOUS | Status: DC

## 2023-05-12 MED ORDER — MAGNESIUM HYDROXIDE 400 MG/5ML PO SUSP: 30.0000 mL | Freq: Every day | ORAL | Status: DC | PRN

## 2023-05-12 MED ORDER — VITAMIN B-12 100 MCG PO TABS: 100.0000 ug | ORAL_TABLET | Freq: Every day | ORAL | Status: DC

## 2023-05-12 MED ORDER — BISACODYL 5 MG PO TBEC: 5.0000 mg | DELAYED_RELEASE_TABLET | Freq: Every day | ORAL | Status: DC | PRN

## 2023-05-12 MED ORDER — FOLIC ACID 1 MG PO TABS: 1.0000 mg | ORAL_TABLET | Freq: Every day | ORAL | Status: DC

## 2023-05-12 MED ORDER — DOCUSATE SODIUM 100 MG PO CAPS
100.0000 mg | ORAL_CAPSULE | Freq: Two times a day (BID) | ORAL | Status: DC
  Administered 2023-05-12 – 2023-05-13 (×2): 100 mg via ORAL
  Filled 2023-05-12 (×5): qty 1

## 2023-05-12 MED ORDER — SODIUM CHLORIDE 0.9% FLUSH
3.0000 mL | Freq: Two times a day (BID) | INTRAVENOUS | Status: DC
  Administered 2023-05-12 – 2023-05-13 (×3): 3 mL via INTRAVENOUS

## 2023-05-12 MED ORDER — HYDRALAZINE HCL 20 MG/ML IJ SOLN: 5.0000 mg | INTRAMUSCULAR | Status: DC | PRN

## 2023-05-12 MED ORDER — THIAMINE HCL 100 MG/ML IJ SOLN: 100.0000 mg | Freq: Every day | INTRAMUSCULAR | Status: DC

## 2023-05-12 MED ORDER — ACETAMINOPHEN 500 MG PO TABS
500.0000 mg | ORAL_TABLET | Freq: Four times a day (QID) | ORAL | Status: DC | PRN
  Administered 2023-05-13 – 2023-05-14 (×2): 500 mg via ORAL
  Filled 2023-05-12 (×2): qty 1

## 2023-05-12 MED ORDER — ENOXAPARIN SODIUM 40 MG/0.4ML IJ SOSY
40.0000 mg | PREFILLED_SYRINGE | INTRAMUSCULAR | Status: DC
  Filled 2023-05-12 (×3): qty 0.4

## 2023-05-12 MED ORDER — ENOXAPARIN SODIUM 40 MG/0.4ML IJ SOSY: 40.0000 mg | PREFILLED_SYRINGE | INTRAMUSCULAR | Status: DC

## 2023-05-12 MED ORDER — LORAZEPAM 2 MG/ML IJ SOLN: 0.0000 mg | Freq: Two times a day (BID) | INTRAMUSCULAR | Status: DC

## 2023-05-12 MED ORDER — THIAMINE MONONITRATE 100 MG PO TABS: 100.0000 mg | ORAL_TABLET | Freq: Every day | ORAL | Status: DC

## 2023-05-12 MED ORDER — ADULT MULTIVITAMIN W/MINERALS CH: 1.0000 | ORAL_TABLET | Freq: Every day | ORAL | Status: DC

## 2023-05-12 MED ORDER — ONDANSETRON HCL 4 MG/2ML IJ SOLN: 4.0000 mg | Freq: Four times a day (QID) | INTRAMUSCULAR | Status: DC | PRN

## 2023-05-12 MED ORDER — LORAZEPAM 2 MG/ML IJ SOLN: 0.0000 mg | Freq: Three times a day (TID) | INTRAMUSCULAR | Status: DC

## 2023-05-12 MED ORDER — TRAZODONE HCL 50 MG PO TABS: 25.0000 mg | ORAL_TABLET | Freq: Every evening | ORAL | Status: DC | PRN

## 2023-05-12 MED ORDER — THIAMINE MONONITRATE 100 MG PO TABS
100.0000 mg | ORAL_TABLET | Freq: Every day | ORAL | Status: DC
  Administered 2023-05-12 – 2023-05-14 (×3): 100 mg via ORAL
  Filled 2023-05-12 (×3): qty 1

## 2023-05-12 MED ORDER — LORAZEPAM 2 MG/ML IJ SOLN
1.0000 mg | INTRAMUSCULAR | Status: DC | PRN
  Administered 2023-05-13: 1 mg via INTRAVENOUS
  Filled 2023-05-12: qty 1

## 2023-05-12 MED ORDER — LISINOPRIL 20 MG PO TABS: 30.0000 mg | ORAL_TABLET | Freq: Every day | ORAL | Status: DC

## 2023-05-12 MED ORDER — LORAZEPAM 2 MG/ML IJ SOLN
0.0000 mg | INTRAMUSCULAR | Status: AC
  Administered 2023-05-12 (×2): 2 mg via INTRAVENOUS
  Filled 2023-05-12 (×3): qty 1

## 2023-05-12 MED ORDER — THIAMINE HCL 100 MG PO TABS: 100.0000 mg | ORAL_TABLET | Freq: Every day | ORAL | Status: DC

## 2023-05-12 MED ORDER — LORAZEPAM 2 MG/ML IJ SOLN
0.0000 mg | Freq: Four times a day (QID) | INTRAMUSCULAR | Status: DC
  Administered 2023-05-12: 3 mg via INTRAVENOUS
  Filled 2023-05-12: qty 2

## 2023-05-12 MED ORDER — ACETAMINOPHEN 650 MG RE SUPP: 650.0000 mg | Freq: Four times a day (QID) | RECTAL | Status: DC | PRN

## 2023-05-12 MED ORDER — FOLIC ACID 1 MG PO TABS
1.0000 mg | ORAL_TABLET | Freq: Every day | ORAL | Status: DC
  Administered 2023-05-13 – 2023-05-14 (×2): 1 mg via ORAL
  Filled 2023-05-12 (×2): qty 1

## 2023-05-12 NOTE — ED Notes (Signed)
ED TO INPATIENT HANDOFF REPORT  ED Nurse Name and Phone #: [phone]  S Name/Age/Gender Adam Peoples Jolliff Jr. 37 y.o. male Room/Bed: 042C/042C  Code Status   Code Status: Full Code  Home/SNF/Other Home Patient oriented to: self, place, time, and situation Is this baseline? Yes   Triage Complete: Triage complete  Chief Complaint Alcohol withdrawal [facility]) [F10.939]  Triage Note Patient BIB [facility] from home requesting detox from Alcohol.   Patient reports having visual hallucinations, shaking, accidentally taking (3) 30 mg lisinopril.   Patient reports normally drinks 1 fifth per day, last drink 02/08/23 am.   Patient reports being at [facility] 1 week ago for same.    Allergies Allergies  Allergen Reactions   Bee Venom Anaphylaxis    Level of Care/Admitting Diagnosis ED Disposition     ED Disposition  Admit   Condition  --   Comment  Hospital Area: [facility] [[id number]]  Level of Care: Progressive [102]  Admit to Progressive based on following criteria: NEUROLOGICAL AND NEUROSURGICAL complex patients with significant risk of instability, who do not meet ICU criteria, yet require close observation or frequent assessment (< / = every 2 - 4 hours) with medical / nursing intervention.  Admit to Progressive based on following criteria: ACUTE MENTAL DISORDER-RELATED Drug/Alcohol Ingestion/Overdose/Withdrawal, Suicidal Ideation/attempt requiring safety sitter and < Q2h monitoring/assessments, moderate to severe agitation that is managed with medication/sitter, CIWA-Ar score < 20.  May admit patient to [facility] or [facility] if equivalent level of care is available:: Yes  Covid Evaluation: Asymptomatic - no recent exposure (last 10 days) testing not required  Diagnosis: Alcohol withdrawal (HCC) [291.81.ICD-9-CM]  Admitting Physician: Hannah Beat [[id number]]  Attending Physician: Hannah Beat [[id number]]  Certification:: I certify this patient will need inpatient  services for at least 2 midnights  Estimated Length of Stay: 2          B Medical/Surgery History Past Medical History:  Diagnosis Date   Alcohol abuse    Eating disorder    history of bulemia   HYPERTENSION 06/25/2010   Seizure (HCC)    ETOH Related Seizure   History reviewed. No pertinent surgical history.   A IV Location/Drains/Wounds Patient Lines/Drains/Airways Status     Active Line/Drains/Airways     Name Placement date Placement time Site Days   Peripheral IV 05/12/23 22 G Posterior;Right Hand 05/12/23  0253  Hand  less than 1            Intake/Output Last 24 hours No intake or output data in the 24 hours ending 05/12/23 1112  Labs/Imaging Results for orders placed or performed during the hospital encounter of 05/12/23 (from the past 48 hour(s))  CBC with Differential     Status: Abnormal   Collection Time: 05/12/23  2:51 AM  Result Value Ref Range   WBC 3.1 (L) 4.0 - 10.5 K/uL   RBC 3.91 (L) 4.22 - 5.81 MIL/uL   Hemoglobin 13.5 13.0 - 17.0 g/dL   HCT 44.0 10.2 - 72.5 %   MCV 99.7 80.0 - 100.0 fL   MCH 34.5 (H) 26.0 - 34.0 pg   MCHC 34.6 30.0 - 36.0 g/dL   RDW 36.6 44.0 - 34.7 %   Platelets 166 150 - 400 K/uL   nRBC 0.0 0.0 - 0.2 %   Neutrophils Relative % 56 %   Neutro Abs 1.8 1.7 - 7.7 K/uL   Lymphocytes Relative 33 %   Lymphs Abs 1.0 0.7 - 4.0 K/uL  Monocytes Relative 9 %   Monocytes Absolute 0.3 0.1 - 1.0 K/uL   Eosinophils Relative 0 %   Eosinophils Absolute 0.0 0.0 - 0.5 K/uL   Basophils Relative 2 %   Basophils Absolute 0.1 0.0 - 0.1 K/uL   Immature Granulocytes 0 %   Abs Immature Granulocytes 0.01 0.00 - 0.07 K/uL    Comment: Performed at [facility], [address], [place], [place] [address]  Ethanol     Status: Abnormal   Collection Time: 05/12/23  2:51 AM  Result Value Ref Range   Alcohol, Ethyl (B) 36 (H) <10 mg/dL    Comment: (NOTE) Lowest detectable limit for serum alcohol is 10 mg/dL.  For medical purposes  only. Performed at [facility], [address], [place], [place] [address]   Comprehensive metabolic panel     Status: Abnormal   Collection Time: 05/12/23  4:33 AM  Result Value Ref Range   Sodium 135 135 - 145 mmol/L   Potassium 3.6 3.5 - 5.1 mmol/L   Chloride 104 98 - 111 mmol/L   CO2 20 (L) 22 - 32 mmol/L   Glucose, Bld 94 70 - 99 mg/dL    Comment: Glucose reference range applies only to samples taken after fasting for at least 8 hours.   BUN 10 6 - 20 mg/dL   Creatinine, Ser 0.98 0.61 - 1.24 mg/dL   Calcium 9.0 8.9 - 11.9 mg/dL   Total Protein 6.3 (L) 6.5 - 8.1 g/dL   Albumin 4.0 3.5 - 5.0 g/dL   AST 147 (H) 15 - 41 U/L   ALT 90 (H) 0 - 44 U/L   Alkaline Phosphatase 47 38 - 126 U/L   Total Bilirubin 0.8 0.3 - 1.2 mg/dL   GFR, Estimated >82 >95 mL/min    Comment: (NOTE) Calculated using the CKD-EPI Creatinine Equation (2021)    Anion gap 11 5 - 15    Comment: Performed at [facility], [address], [place], [place] [address]  Rapid urine drug screen (hospital performed)     Status: Abnormal   Collection Time: 05/12/23  5:21 AM  Result Value Ref Range   Opiates NONE DETECTED NONE DETECTED   Cocaine NONE DETECTED NONE DETECTED   Benzodiazepines POSITIVE (A) NONE DETECTED   Amphetamines NONE DETECTED NONE DETECTED   Tetrahydrocannabinol NONE DETECTED NONE DETECTED   Barbiturates NONE DETECTED NONE DETECTED    Comment: (NOTE) DRUG SCREEN FOR MEDICAL PURPOSES ONLY.  IF CONFIRMATION IS NEEDED FOR ANY PURPOSE, NOTIFY LAB WITHIN 5 DAYS.  LOWEST DETECTABLE LIMITS FOR URINE DRUG SCREEN Drug Class                     Cutoff (ng/mL) Amphetamine and metabolites    1000 Barbiturate and metabolites    200 Benzodiazepine                 200 Opiates and metabolites        300 Cocaine and metabolites        300 THC                            50 Performed at [facility], [address], [place], [place] [address]    No results found.  Pending  Labs Unresulted Labs (From admission, onward)     Start     Ordered   05/13/23 0500  Comprehensive metabolic  panel  Tomorrow morning,   R        05/12/23 0615   05/13/23 0500  CBC  Tomorrow morning,   R        05/12/23 0615            Vitals/Pain Today's Vitals   05/12/23 0845 05/12/23 1016 05/12/23 1032 05/12/23 1059  BP: (!) 99/54 121/80    Pulse: (!) 59 (!) 58 (!) 55   Resp:  17 13   Temp:    98.1 F (36.7 C)  TempSrc:    Oral  SpO2:  97% 97%   Weight:      Height:      PainSc:        Isolation Precautions No active isolations  Medications Medications  LORazepam (ATIVAN) tablet 1-4 mg (has no administration in time range)    Or  LORazepam (ATIVAN) injection 1-4 mg (has no administration in time range)  thiamine (VITAMIN B1) tablet 100 mg (100 mg Oral Given 05/12/23 1011)    Or  thiamine (VITAMIN B1) injection 100 mg ( Intravenous See Alternative 05/12/23 1011)  folic acid (FOLVITE) tablet 1 mg (has no administration in time range)  multivitamin with minerals tablet 1 tablet (1 tablet Oral Given 05/12/23 1011)  enoxaparin (LOVENOX) injection 40 mg (40 mg Subcutaneous Not Given 05/12/23 1011)  sodium chloride flush (NS) 0.9 % injection 3 mL (3 mLs Intravenous Given 05/12/23 1012)  lactated ringers infusion ( Intravenous New Bag/Given 05/12/23 1012)  acetaminophen (TYLENOL) tablet 500 mg (has no administration in time range)  docusate sodium (COLACE) capsule 100 mg (100 mg Oral Given 05/12/23 1011)  polyethylene glycol (MIRALAX / GLYCOLAX) packet 17 g (has no administration in time range)  bisacodyl (DULCOLAX) EC tablet 5 mg (has no administration in time range)  ondansetron (ZOFRAN) tablet 4 mg (has no administration in time range)    Or  ondansetron (ZOFRAN) injection 4 mg (has no administration in time range)  hydrALAZINE (APRESOLINE) injection 5 mg (has no administration in time range)  LORazepam (ATIVAN) injection 0-4 mg ( Intravenous Not Given 05/12/23 0845)     Followed by  LORazepam (ATIVAN) injection 0-4 mg (has no administration in time range)  diazepam (VALIUM) tablet 5 mg (5 mg Oral Given 05/12/23 1011)    Mobility walks with person assist     Focused Assessments Neuro Assessment Handoff:  Swallow screen pass? No          Neuro Assessment:   Neuro Checks:      Has TPA been given? No If patient is a Neuro Trauma and patient is going to OR before floor call report to 4N Charge nurse: [phone] or [phone]   R Recommendations: See Admitting Provider Note  Report given to:   Additional Notes:

## 2023-05-12 NOTE — ED Notes (Signed)
Admitting Provider at bedside. 

## 2023-05-12 NOTE — H&P (Signed)
History and Physical    Patient: Adam [name] [name]. OZH:[id number] DOB: 25-May-1986 DOA: 05/12/2023 DOS: the patient was seen and examined on 05/12/2023 PCP: Pcp, No  Patient coming from: Home - lives with wife and 3 children; NOK: Wife, Blain Pais, [phone]   Chief Complaint: ETOH dependence  HPI: Adam [name]. is a 37 y.o. male with medical history significant of ETOH dependence, eating d/o, HTN, and ETOH-associated seizure presenting for detox.  Patient reports ETOH withdrawal, decided to stop drinking at his wife's insistence.  He started drinking at [age], drinks a 5th a day of bourbon.  Last drink was 1 1/2 days ago (ETOH level still positive).  Current withdrawal symptoms include hallucinations (seeing fuzzy things), tremor, diaphoresis.  He has never been to rehab, does not have a sponsor, has tried to set up AA meetings in the past but does not appear to have ever actually been.  He does have a h/o withdrawal seizures.  He reports a desire to quit.    ER Course:  Carryover, per Dr. Arville Care:  Alcohol abuse and dependence with history of alcohol withdrawal seizures and hypertension coming with alcohol withdrawal with alcohol level of 36, CIWA of 19 and after Ativan 13.  His wife told him that he cannot come back home before detoxification.      Review of Systems: As mentioned in the history of present illness. All other systems reviewed and are negative. Past Medical History:  Diagnosis Date   Alcohol abuse    Eating disorder    history of bulemia   HYPERTENSION 06/25/2010   Seizure (HCC)    ETOH Related Seizure   History reviewed. No pertinent surgical history. Social History:  reports that he has quit smoking. He uses smokeless tobacco. He reports current alcohol use of about 70.0 standard drinks of alcohol per week. He reports that he does not currently use drugs.  Allergies  Allergen Reactions   Bee Venom Anaphylaxis    Family History  Problem  Relation Age of Onset   Cancer Mother        lung   Hypertension Father     Prior to Admission medications   Medication Sig Start Date End Date Taking? Authorizing Provider  Cyanocobalamin (VITAMIN B12 PO) Take 1 tablet by mouth daily.   Yes [provider]  folic acid (FOLVITE) 1 MG tablet Take 1 tablet (1 mg total) by mouth daily. 09/13/22  Yes Rai, Ripudeep K, MD  lisinopril (ZESTRIL) 30 MG tablet Take 1 tablet (30 mg total) by mouth daily. 03/30/23  Yes Lewie Chamber, MD  thiamine (VITAMIN B1) 100 MG tablet Take 1 tablet (100 mg total) by mouth daily. 03/07/23  Yes Lockie Mola, MD  Multiple Vitamin (MULTIVITAMIN WITH MINERALS) TABS tablet Take 1 tablet by mouth daily. Patient not taking: Reported on 05/12/2023 03/07/23   Lockie Mola, MD    Physical Exam: Vitals:   05/12/23 0630 05/12/23 0631 05/12/23 0640 05/12/23 0751  BP: 106/64 106/64  (!) 99/54  Pulse: 81 70 88 (!) 59  Resp: (!) 27  14 16   Temp:   98 F (36.7 C)   TempSrc:   Oral   SpO2: 96%  96% 92%  Weight:      Height:       General:  Appears somnolent and diaphoretic but otherwise calm and comfortable and is in NAD Eyes:   EOMI, normal lids, iris ENT:  grossly normal hearing, lips & tongue, mmm Neck:  no LAD,  masses or thyromegaly Cardiovascular:  RRR, no m/r/g. No LE edema.  Respiratory:   CTA bilaterally with no wheezes/rales/rhonchi.  Normal respiratory effort. Abdomen:  soft, NT, ND Skin:  no rash or induration seen on limited exam Musculoskeletal:  grossly normal tone BUE/BLE, good ROM, no bony abnormality Psychiatric:  blunted/somnolent mood and affect, speech fluent and appropriate, AOx3 Neurologic:  CN 2-12 grossly intact, moves all extremities in coordinated fashion, mildly tremulous   Radiological Exams on Admission: Independently reviewed - see discussion in A/P where applicable  No results found.  EKG: Independently reviewed.  NSR with rate 71; no evidence of acute ischemia   Labs  on Admission: I have personally reviewed the available labs and imaging studies at the time of the admission.  Pertinent labs:    CO2 20 AST 105/ALT 90 WBC 3.1 ETOH 36 UDS + BZD   Assessment and Plan: Principal Problem:   Alcohol withdrawal (HCC)    ETOH dependence with withdrawal -Patient with chronic ETOH dependence -Number of drinks per day: a 5th of bourbon -Number of admissions for management of alcohol withdrawal syndrome (detox): 11 since January, most recently 5/19-21 with ETOH withdrawal seizure -History of withdrawal seizures, DTs: yes (currently c/o hallucinations) -Patient is exhibiting active s/sx of withdrawal, CIWA score was 19 on presentation, currently down to 3 -BAL on admission: 36 -He is at high risk for complications of withdrawal including seizures, DTs; he is at high risk for needing Precedex to help get through DTs -Will admit to progressive care at this time -CIWA protocol -Folate, thiamine, and MVI ordered -Will provide fixed-dose (Valium 5 mg PO q6h) and also symptom-triggered BZD (Ativan per CIWA protocol) given his high BAL level; h/o severe withdrawal; and/or current signs of severe withdrawal. -TOC team consult for possible inpatient treatment -Likely needs 32-month driving restriction after discharge -Elevated LFTs are likely related to alcoholism -Consider offering a medication for Alcohol Use Disorder at the time of d/c, to include Disulfuram; Naltrexone; or Acamprosate.  HTN -Hold lisinopril in the setting of decreased BP in the ER (withdrawal currently controlled)    Advance Care Planning:   Code Status: Full Code   Consults: TOC team  DVT Prophylaxis: Lovenox  Family Communication: None present; I spoke with his wife by telephone at the time of admission  Severity of Illness: The appropriate patient status for this patient is INPATIENT. Inpatient status is judged to be reasonable and necessary in order to provide the required  intensity of service to ensure the patient's safety. The patient's presenting symptoms, physical exam findings, and initial radiographic and laboratory data in the context of their chronic comorbidities is felt to place them at high risk for further clinical deterioration. Furthermore, it is not anticipated that the patient will be medically stable for discharge from the hospital within 2 midnights of admission.   * I certify that at the point of admission it is my clinical judgment that the patient will require inpatient hospital care spanning beyond 2 midnights from the point of admission due to high intensity of service, high risk for further deterioration and high frequency of surveillance required.*  Author: Jonah Blue, MD 05/12/2023 9:08 AM  For on call review [email or web address].

## 2023-05-12 NOTE — TOC CM/SW Note (Signed)
Transition of Care [facility]) - Inpatient Brief Assessment   Patient Details  Name: Adam [name] [name]. MRN: [id number] Date of Birth: May 13, 1986  Transition of Care [facility]) CM/SW Contact:    Gordy Clement, RN Phone Number: 05/12/2023, 3:49 PM   Clinical Narrative:  Brief assessment completed. Patient from home with Wife. Patient here for detox from alcohol.   TOC will continue to follow patient for any additional discharge needs      Transition of Care Asessment: Insurance and Status: Insurance coverage has been reviewed Patient has primary care physician: No (One will be requested) Home environment has been reviewed: from home with Wife Prior level of function:: Independent Prior/Current Home Services: No current home services Social Determinants of Health Reivew: SDOH reviewed interventions complete Readmission risk has been reviewed: Yes (40%) Transition of care needs: transition of care needs identified, TOC will continue to follow

## 2023-05-12 NOTE — ED Triage Notes (Signed)
Patient BIB [facility] from home requesting detox from Alcohol.   Patient reports having visual hallucinations, shaking, accidentally taking (3) 30 mg lisinopril.   Patient reports normally drinks 1 fifth per day, last drink 02/08/23 am.   Patient reports being at [facility] 1 week ago for same.

## 2023-05-12 NOTE — ED Provider Notes (Signed)
[place] [facility]ERGENCY DEPARTMENT AT [facility] Provider Note   CSN: [id number] Arrival date & time: 05/12/23  1191     History  Chief Complaint  Patient presents with   Withdrawal    Adam Peoples Freeberg Montez Hageman. is a 37 y.o. male.  HPI     This is a 37 year old male who presents requesting detox.  Patient reports that he drinks 1/5 of liquor daily.  He last drank earlier yesterday morning and states that he only "had 1 shot."  Patient reports that he was recently at [facility] for the same.  He left AGAINST MEDICAL ADVICE approximately 1 week ago.  He is here at the urging of his wife in an attempt to get detox.  Denies any other drug use.  Reports tremulousness and hallucinations.  Reports remote history of seizures with withdrawal.  Chart reviewed.  Unable to access outside records from [facility].  However he has had several recent visits including 4/15, 5/6 and 5/19 all for alcohol related admissions.  He did reportedly have a seizure related to alcohol withdrawal during his April admission.  Home Medications Prior to Admission medications   Medication Sig Start Date End Date Taking? Authorizing Provider  acamprosate (CAMPRAL) 333 MG tablet Take 2 tablets (666 mg total) by mouth 3 (three) times daily with meals. Patient not taking: Reported on 04/10/2023 03/07/23   Lockie Mola, MD  chlordiazePOXIDE (LIBRIUM) 25 MG capsule Take 1 capsule 3 times a day for 1 day then 2 times a day for 1 day then daily for 1 day Patient not taking: Reported on 04/10/2023 03/30/23   Lewie Chamber, MD  folic acid (FOLVITE) 1 MG tablet Take 1 tablet (1 mg total) by mouth daily. Patient not taking: Reported on 04/10/2023 09/13/22   Rai, Delene Ruffini, MD  lisinopril (ZESTRIL) 30 MG tablet Take 1 tablet (30 mg total) by mouth daily. 03/30/23   Lewie Chamber, MD  Multiple Vitamin (MULTIVITAMIN WITH MINERALS) TABS tablet Take 1 tablet by mouth daily. 03/07/23   Lockie Mola, MD  thiamine  (VITAMIN B1) 100 MG tablet Take 1 tablet (100 mg total) by mouth daily. 03/07/23   Lockie Mola, MD      Allergies    Bee venom    Review of Systems   Review of Systems  Constitutional:  Negative for fever.  Respiratory:  Negative for shortness of breath.   Neurological:  Positive for tremors.  Psychiatric/Behavioral:  Positive for hallucinations.   All other systems reviewed and are negative.   Physical Exam Updated Vital Signs BP 130/84   Pulse 88   Temp 98.1 F (36.7 C) (Oral)   Resp 17   Ht 1.702 m (5\' 7" )   Wt 77.1 kg   SpO2 98%   BMI 26.63 kg/m  Physical Exam Vitals and nursing note reviewed.  Constitutional:      Appearance: He is well-developed. He is diaphoretic. He is not ill-appearing.  HENT:     Head: Normocephalic and atraumatic.  Eyes:     Pupils: Pupils are equal, round, and reactive to light.  Cardiovascular:     Rate and Rhythm: Normal rate and regular rhythm.     Heart sounds: Normal heart sounds. No murmur heard. Pulmonary:     Effort: Pulmonary effort is normal. No respiratory distress.     Breath sounds: Normal breath sounds. No wheezing.  Abdominal:     General: Bowel sounds are normal.     Palpations: Abdomen is soft.  Tenderness: There is no abdominal tenderness. There is no rebound.  Musculoskeletal:     Cervical back: Neck supple.  Lymphadenopathy:     Cervical: No cervical adenopathy.  Skin:    General: Skin is warm.  Neurological:     Mental Status: He is alert and oriented to person, place, and time.     Comments: Slightly tremulous  Psychiatric:        Mood and Affect: Mood normal.     ED Results / Procedures / Treatments   Labs (all labs ordered are listed, but only abnormal results are displayed) Labs Reviewed  CBC WITH DIFFERENTIAL/PLATELET - Abnormal; Notable for the following components:      Result Value   WBC 3.1 (*)    RBC 3.91 (*)    MCH 34.5 (*)    All other components within normal limits  ETHANOL -  Abnormal; Notable for the following components:   Alcohol, Ethyl (B) 36 (*)    All other components within normal limits  COMPREHENSIVE METABOLIC PANEL - Abnormal; Notable for the following components:   CO2 20 (*)    Total Protein 6.3 (*)    AST 105 (*)    ALT 90 (*)    All other components within normal limits  RAPID URINE DRUG SCREEN, HOSP PERFORMED    EKG EKG Interpretation  Date/Time:  Thursday May 12 2023 02:52:37 EDT Ventricular Rate:  71 PR Interval:  51 QRS Duration: 92 QT Interval:  386 QTC Calculation: 420 R Axis:   62 Text Interpretation: Sinus rhythm Short PR interval Confirmed by Ross Marcus ([id number]) on 05/12/2023 3:12:41 AM  Radiology No results found.  Procedures .Critical Care  Performed by: Shon Baton, MD Authorized by: Shon Baton, MD   Critical care provider statement:    Critical care time (minutes):  45   Critical care was necessary to treat or prevent imminent or life-threatening deterioration of the following conditions: Alcohol withdrawal.   Critical care was time spent personally by me on the following activities:  Development of treatment plan with patient or surrogate, discussions with consultants, evaluation of patient's response to treatment, examination of patient, ordering and review of laboratory studies, ordering and review of radiographic studies, ordering and performing treatments and interventions, pulse oximetry, re-evaluation of patient's condition and review of old charts     Medications Ordered in ED Medications  LORazepam (ATIVAN) injection 0-4 mg ( Intravenous See Alternative 05/12/23 0429)    Or  LORazepam (ATIVAN) tablet 0-4 mg (2 mg Oral Given 05/12/23 0429)  LORazepam (ATIVAN) injection 0-4 mg (has no administration in time range)    Or  LORazepam (ATIVAN) tablet 0-4 mg (has no administration in time range)  thiamine (VITAMIN B1) tablet 100 mg (has no administration in time range)    Or  thiamine (VITAMIN  B1) injection 100 mg (has no administration in time range)    ED Course/ Medical Decision Making/ A&P Clinical Course as of 05/12/23 0532  Thu May 12, 2023  0531 Had a long discussion with the patient.  He is at high risk for alcohol withdrawal seizure and DTs given his history.  Blood alcohol level only 36.  However, given recent multiple hospitalizations for the same, discussed with him that this would not be effective if he does not make the decision to stop drinking and go through formal rehab.  Patient stated understanding.  States that he is committed to stop drinking.  Because of his high risk, will admit  to the hospital for detox.  He has scored 19 and 13 on his CIWA and has received several doses of Ativan at this point. [CH]    Clinical Course User Index [CH] Adam [name], Mayer Masker, MD                             Medical Decision Making Amount and/or Complexity of Data Reviewed Labs: ordered.  Risk OTC drugs. Prescription drug management. Decision regarding hospitalization.   This patient presents to the ED for concern of alcohol withdrawal, this involves an extensive number of treatment options, and is a complaint that carries with it a high risk of complications and morbidity.  I considered the following differential and admission for this acute, potentially life threatening condition.  The differential diagnosis includes alcohol withdrawal, intoxication, complication secondary to alcohol use  MDM:    This is a 37 year old male who presents with concern for alcohol withdrawal.  History of the same.  Has had multiple hospital admissions for alcohol withdrawal in the past including for alcohol withdrawal seizures.  Reports last alcohol use was yesterday morning.  He is overall nontoxic.  He is hypertensive.  He is tremulous and diaphoretic.  Initial CIWA score 19.  Received Ativan per protocol.  Labs obtained and reviewed.  He has moderately elevated LFTs which are close to his  baseline.  Blood alcohol level is 36.  Given that he is within 24 hours of his last drink and a fairly low alcohol level, he is at high risk.  After receiving Ativan, CIWA score down to 13.  See clinical course above.  Because he is high risk for alcohol withdrawal complication including seizure and DTs, will plan for admission.  (Labs, imaging, consults)  Labs: I Ordered, and personally interpreted labs.  The pertinent results include: CBC, CMP, EtOH  Imaging Studies ordered: I ordered imaging studies including none I independently visualized and interpreted imaging. I agree with the radiologist interpretation  Additional history obtained from chart review.  External records from outside source obtained and reviewed including prior evaluations  Cardiac Monitoring: The patient was maintained on a cardiac monitor.  If on the cardiac monitor, I personally viewed and interpreted the cardiac monitored which showed an underlying rhythm of: Sinus  Reevaluation: After the interventions noted above, I reevaluated the patient and found that they have :improved  Social Determinants of Health:  lives independently  Disposition: Admit  Co morbidities that complicate the patient evaluation  Past Medical History:  Diagnosis Date   Alcohol abuse    Eating disorder    history of bulemia   HYPERTENSION 06/25/2010   Seizure (HCC)    ETOH Related Seizure     Medicines Meds ordered this encounter  Medications   OR Linked Order Group    LORazepam (ATIVAN) injection 0-4 mg     Order Specific Question:   CIWA-AR < 5 =     Answer:   0 mg     Order Specific Question:   CIWA-AR 5 -10 =     Answer:   1 mg     Order Specific Question:   CIWA-AR 11 -15 =     Answer:   2 mg     Order Specific Question:   CIWA-AR 16 -20 =     Answer:   3 mg     Order Specific Question:   CIWA-AR 16 -20 =     Answer:  Recheck CIWA-AR in 1 hour; if > 20 notify MD     Order Specific Question:   CIWA-AR > 20 =      Answer:   4 mg     Order Specific Question:   CIWA-AR > 20 =     Answer:   Notify MD    LORazepam (ATIVAN) tablet 0-4 mg     Order Specific Question:   CIWA-AR < 5 =     Answer:   0 mg     Order Specific Question:   CIWA-AR 5 -10 =     Answer:   1 mg     Order Specific Question:   CIWA-AR 11 -15 =     Answer:   2 mg     Order Specific Question:   CIWA-AR 16 -20 =     Answer:   3 mg     Order Specific Question:   CIWA-AR 16 -20 =     Answer:   Recheck CIWA-AR in 1 hour; if > 20 notify MD     Order Specific Question:   CIWA-AR > 20 =     Answer:   4 mg     Order Specific Question:   CIWA-AR > 20 =     Answer:   Notify MD   OR Linked Order Group    LORazepam (ATIVAN) injection 0-4 mg     Order Specific Question:   CIWA-AR < 5 =     Answer:   0 mg     Order Specific Question:   CIWA-AR 5 -10 =     Answer:   1 mg     Order Specific Question:   CIWA-AR 11 -15 =     Answer:   2 mg     Order Specific Question:   CIWA-AR 16 -20 =     Answer:   3 mg     Order Specific Question:   CIWA-AR 16 -20 =     Answer:   Recheck CIWA-AR in 1 hour; if > 20 notify MD     Order Specific Question:   CIWA-AR > 20 =     Answer:   4 mg     Order Specific Question:   CIWA-AR > 20 =     Answer:   Notify MD    LORazepam (ATIVAN) tablet 0-4 mg     Order Specific Question:   CIWA-AR < 5 =     Answer:   0 mg     Order Specific Question:   CIWA-AR 5 -10 =     Answer:   1 mg     Order Specific Question:   CIWA-AR 11 -15 =     Answer:   2 mg     Order Specific Question:   CIWA-AR 16 -20 =     Answer:   3 mg     Order Specific Question:   CIWA-AR 16 -20 =     Answer:   Recheck CIWA-AR in 1 hour; if > 20 notify MD     Order Specific Question:   CIWA-AR > 20 =     Answer:   4 mg     Order Specific Question:   CIWA-AR > 20 =     Answer:   Notify MD   OR Linked Order Group    thiamine (VITAMIN B1) tablet 100 mg    thiamine (VITAMIN B1) injection 100 mg    I have  reviewed the patients home medicines and  have made adjustments as needed  Problem List / ED Course: Problem List Items Addressed This Visit       Other   Alcohol withdrawal (HCC) - Primary                Final Clinical Impression(s) / ED Diagnoses Final diagnoses:  Alcohol withdrawal syndrome without complication [facility])    Rx / DC Orders ED Discharge Orders     None         Shon Baton, MD 05/12/23 [phone]

## 2023-05-12 NOTE — ED Notes (Signed)
ED TO INPATIENT HANDOFF REPORT  ED Nurse Name and Phone #: Beatris Ship RN [phone]  S Name/Age/Gender Adam [name]. 37 y.o. male Room/Bed: 026C/026C  Code Status   Code Status: Full Code  Home/SNF/Other Home Patient oriented to: self, place, time, and situation Is this baseline? Yes   Triage Complete: Triage complete  Chief Complaint Alcohol withdrawal [facility]) [F10.939]  Triage Note Patient BIB [facility] from home requesting detox from Alcohol.   Patient reports having visual hallucinations, shaking, accidentally taking (3) 30 mg lisinopril.   Patient reports normally drinks 1 fifth per day, last drink 02/08/23 am.   Patient reports being at [facility] 1 week ago for same.    Allergies Allergies  Allergen Reactions   Bee Venom Anaphylaxis    Level of Care/Admitting Diagnosis ED Disposition     ED Disposition  Admit   Condition  --   Comment  Hospital Area: [facility] [[id number]]  Level of Care: Telemetry Medical [104]  May admit patient to [facility] or [facility] if equivalent level of care is available:: Yes  Covid Evaluation: Asymptomatic - no recent exposure (last 10 days) testing not required  Diagnosis: Alcohol withdrawal (HCC) [291.81.ICD-9-CM]  Admitting Physician: Hannah Beat [[id number]]  Attending Physician: Hannah Beat [[id number]]  Certification:: I certify this patient will need inpatient services for at least 2 midnights  Estimated Length of Stay: 2          B Medical/Surgery History Past Medical History:  Diagnosis Date   Alcohol abuse    Eating disorder    history of bulemia   HYPERTENSION 06/25/2010   Seizure (HCC)    ETOH Related Seizure   History reviewed. No pertinent surgical history.   A IV Location/Drains/Wounds Patient Lines/Drains/Airways Status     Active Line/Drains/Airways     Name Placement date Placement time Site Days   Peripheral IV 05/12/23 22 G Posterior;Right Hand 05/12/23  0253  Hand  less than  1            Intake/Output Last 24 hours No intake or output data in the 24 hours ending 05/12/23 0748  Labs/Imaging Results for orders placed or performed during the hospital encounter of 05/12/23 (from the past 48 hour(s))  CBC with Differential     Status: Abnormal   Collection Time: 05/12/23  2:51 AM  Result Value Ref Range   WBC 3.1 (L) 4.0 - 10.5 K/uL   RBC 3.91 (L) 4.22 - 5.81 MIL/uL   Hemoglobin 13.5 13.0 - 17.0 g/dL   HCT 78.2 95.6 - 21.3 %   MCV 99.7 80.0 - 100.0 fL   MCH 34.5 (H) 26.0 - 34.0 pg   MCHC 34.6 30.0 - 36.0 g/dL   RDW 08.6 57.8 - 46.9 %   Platelets 166 150 - 400 K/uL   nRBC 0.0 0.0 - 0.2 %   Neutrophils Relative % 56 %   Neutro Abs 1.8 1.7 - 7.7 K/uL   Lymphocytes Relative 33 %   Lymphs Abs 1.0 0.7 - 4.0 K/uL   Monocytes Relative 9 %   Monocytes Absolute 0.3 0.1 - 1.0 K/uL   Eosinophils Relative 0 %   Eosinophils Absolute 0.0 0.0 - 0.5 K/uL   Basophils Relative 2 %   Basophils Absolute 0.1 0.0 - 0.1 K/uL   Immature Granulocytes 0 %   Abs Immature Granulocytes 0.01 0.00 - 0.07 K/uL    Comment: Performed at [facility], [address],  [place], [place] [address]  Ethanol     Status: Abnormal   Collection Time: 05/12/23  2:51 AM  Result Value Ref Range   Alcohol, Ethyl (B) 36 (H) <10 mg/dL    Comment: (NOTE) Lowest detectable limit for serum alcohol is 10 mg/dL.  For medical purposes only. Performed at [facility], [address] [place], [place] [address]   Comprehensive metabolic panel     Status: Abnormal   Collection Time: 05/12/23  4:33 AM  Result Value Ref Range   Sodium 135 135 - 145 mmol/L   Potassium 3.6 3.5 - 5.1 mmol/L   Chloride 104 98 - 111 mmol/L   CO2 20 (L) 22 - 32 mmol/L   Glucose, Bld 94 70 - 99 mg/dL    Comment: Glucose reference range applies only to samples taken after fasting for at least 8 hours.   BUN 10 6 - 20 mg/dL   Creatinine, Ser 0.98 0.61 - 1.24 mg/dL   Calcium 9.0 8.9 - 11.9 mg/dL   Total  Protein 6.3 (L) 6.5 - 8.1 g/dL   Albumin 4.0 3.5 - 5.0 g/dL   AST 147 (H) 15 - 41 U/L   ALT 90 (H) 0 - 44 U/L   Alkaline Phosphatase 47 38 - 126 U/L   Total Bilirubin 0.8 0.3 - 1.2 mg/dL   GFR, Estimated >82 >95 mL/min    Comment: (NOTE) Calculated using the CKD-EPI Creatinine Equation (2021)    Anion gap 11 5 - 15    Comment: Performed at [facility], [address] [place], [place] [address]  Rapid urine drug screen (hospital performed)     Status: Abnormal   Collection Time: 05/12/23  5:21 AM  Result Value Ref Range   Opiates NONE DETECTED NONE DETECTED   Cocaine NONE DETECTED NONE DETECTED   Benzodiazepines POSITIVE (A) NONE DETECTED   Amphetamines NONE DETECTED NONE DETECTED   Tetrahydrocannabinol NONE DETECTED NONE DETECTED   Barbiturates NONE DETECTED NONE DETECTED    Comment: (NOTE) DRUG SCREEN FOR MEDICAL PURPOSES ONLY.  IF CONFIRMATION IS NEEDED FOR ANY PURPOSE, NOTIFY LAB WITHIN 5 DAYS.  LOWEST DETECTABLE LIMITS FOR URINE DRUG SCREEN Drug Class                     Cutoff (ng/mL) Amphetamine and metabolites    1000 Barbiturate and metabolites    200 Benzodiazepine                 200 Opiates and metabolites        300 Cocaine and metabolites        300 THC                            50 Performed at [facility], [address], [place], [place] [address]    No results found.  Pending Labs Unresulted Labs (From admission, onward)     Start     Ordered   05/13/23 0500  Comprehensive metabolic panel  Tomorrow morning,   R        05/12/23 0615   05/13/23 0500  CBC  Tomorrow morning,   R        05/12/23 0615            Vitals/Pain Today's Vitals   05/12/23 0630 05/12/23 0631 05/12/23 0640 05/12/23 0710  BP: 106/64 106/64    Pulse: 81 70 88  Resp: (!) 27  14   Temp:   98 F (36.7 C)   TempSrc:   Oral   SpO2: 96%  96%   Weight:      Height:      PainSc:    Asleep    Isolation Precautions No active  isolations  Medications Medications  LORazepam (ATIVAN) injection 0-4 mg ( Intravenous See Alternative 05/12/23 0429)    Or  LORazepam (ATIVAN) tablet 0-4 mg (2 mg Oral Given 05/12/23 0429)  LORazepam (ATIVAN) injection 0-4 mg (has no administration in time range)    Or  LORazepam (ATIVAN) tablet 0-4 mg (has no administration in time range)  thiamine (VITAMIN B1) tablet 100 mg (has no administration in time range)    Or  thiamine (VITAMIN B1) injection 100 mg (has no administration in time range)  lisinopril (ZESTRIL) tablet 30 mg (has no administration in time range)  vitamin B-12 (CYANOCOBALAMIN) tablet 100 mcg (has no administration in time range)  folic acid (FOLVITE) tablet 1 mg (has no administration in time range)  enoxaparin (LOVENOX) injection 40 mg (has no administration in time range)  0.9 %  sodium chloride infusion ( Intravenous New Bag/Given 05/12/23 0630)  acetaminophen (TYLENOL) tablet 650 mg (650 mg Oral Given 05/12/23 0636)    Or  acetaminophen (TYLENOL) suppository 650 mg ( Rectal See Alternative 05/12/23 0636)  traZODone (DESYREL) tablet 25 mg (has no administration in time range)  magnesium hydroxide (MILK OF MAGNESIA) suspension 30 mL (has no administration in time range)  ondansetron (ZOFRAN) tablet 4 mg (has no administration in time range)    Or  ondansetron (ZOFRAN) injection 4 mg (has no administration in time range)    Mobility walks      R Recommendations: See Admitting Provider Note  Report given to: 5M20

## 2023-05-13 DIAGNOSIS — F10930 Alcohol use, unspecified with withdrawal, uncomplicated: Secondary | ICD-10-CM | POA: Diagnosis not present

## 2023-05-13 LAB — GLUCOSE, CAPILLARY
Glucose-Capillary: 85 mg/dL (ref 70–99)
Glucose-Capillary: 86 mg/dL (ref 70–99)
Glucose-Capillary: 95 mg/dL (ref 70–99)
Glucose-Capillary: 103 mg/dL — ABNORMAL HIGH (ref 70–99)

## 2023-05-13 LAB — CBC
HCT: 38.7 % — ABNORMAL LOW (ref 39.0–52.0)
Hemoglobin: 12.9 g/dL — ABNORMAL LOW (ref 13.0–17.0)
MCH: 34.6 pg — ABNORMAL HIGH (ref 26.0–34.0)
MCHC: 33.3 g/dL (ref 30.0–36.0)
MCV: 103.8 fL — ABNORMAL HIGH (ref 80.0–100.0)
Platelets: 143 10*3/uL — ABNORMAL LOW (ref 150–400)
RBC: 3.73 MIL/uL — ABNORMAL LOW (ref 4.22–5.81)
RDW: 12.2 % (ref 11.5–15.5)
WBC: 3.7 10*3/uL — ABNORMAL LOW (ref 4.0–10.5)
nRBC: 0 % (ref 0.0–0.2)

## 2023-05-13 LAB — COMPREHENSIVE METABOLIC PANEL
ALT: 77 U/L — ABNORMAL HIGH (ref 0–44)
AST: 72 U/L — ABNORMAL HIGH (ref 15–41)
Albumin: 3.6 g/dL (ref 3.5–5.0)
Alkaline Phosphatase: 44 U/L (ref 38–126)
Anion gap: 12 (ref 5–15)
BUN: 14 mg/dL (ref 6–20)
CO2: 22 mmol/L (ref 22–32)
Calcium: 9.3 mg/dL (ref 8.9–10.3)
Chloride: 104 mmol/L (ref 98–111)
Creatinine, Ser: 1.02 mg/dL (ref 0.61–1.24)
GFR, Estimated: >60 mL/min (ref >60)
Glucose, Bld: 89 mg/dL (ref 70–99)
Potassium: 3.7 mmol/L (ref 3.5–5.1)
Sodium: 138 mmol/L (ref 135–145)
Total Bilirubin: 1 mg/dL (ref 0.3–1.2)
Total Protein: 5.8 g/dL — ABNORMAL LOW (ref 6.5–8.1)

## 2023-05-13 MED ORDER — DIAZEPAM 5 MG PO TABS
5.0000 mg | ORAL_TABLET | Freq: Three times a day (TID) | ORAL | Status: DC
  Administered 2023-05-13 – 2023-05-14 (×3): 5 mg via ORAL
  Filled 2023-05-13 (×3): qty 1

## 2023-05-13 NOTE — Plan of Care (Signed)

## 2023-05-13 NOTE — Progress Notes (Signed)
Patient states that he does not need an IV and does not want one.  Bedside RN updated and consult for IV team start completed.

## 2023-05-13 NOTE — Progress Notes (Signed)
PROGRESS NOTE        PATIENT DETAILS Name: Adam [name]. Age: 37 y.o. Sex: male Date of Birth: August 07, 1986 Admit Date: 05/12/2023 Admitting Physician Hannah Beat, MD PCP:Pcp, No  Brief Summary: Patient is a 37 y.o.  male with tree of EtOH use, HTN-presented with EtOH withdrawal symptoms.  Significant events: 6/20>> admit to [facility]  Significant studies: None  Significant microbiology data: None  Procedures: None  Consults: None  Subjective: Lying comfortably in bed-denies any chest pain or shortness of breath.  Claims to feel much better.  No hallucinations.  Minimal tremor this morning.  Objective: Vitals: Blood pressure 133/85, pulse 73, temperature 98 F (36.7 C), temperature source Oral, resp. rate 16, height 5\' 7"  (1.702 m), weight 77.1 kg, SpO2 97 %.   Exam: Gen Exam:Alert awake-not in any distress HEENT:atraumatic, normocephalic Chest: B/L clear to auscultation anteriorly CVS:S1S2 regular Abdomen:soft non tender, non distended Extremities:no edema Neurology: Non focal Skin: no rash  Pertinent Labs/Radiology:    Latest Ref Rng & Units 05/13/2023    3:57 AM 05/12/2023    2:51 AM 04/11/2023    5:01 AM  CBC  WBC 4.0 - 10.5 K/uL 3.7  3.1  2.9   Hemoglobin 13.0 - 17.0 g/dL 16.1  09.6  04.5   Hematocrit 39.0 - 52.0 % 38.7  39.0  38.9   Platelets 150 - 400 K/uL 143  166  178     Lab Results  Component Value Date   NA 138 05/13/2023   K 3.7 05/13/2023   CL 104 05/13/2023   CO2 22 05/13/2023      Assessment/Plan: EtOH withdrawal Improved-last drink was on 6/19 Hardly any tremors-no hallucinations this morning Taper Valium to every 8 hours-continue Ativan per CIWA protocol Continue inpatient monitoring-remains in the window for withdrawal symptoms.  HTN BP stable off lisinopril-resume when able  Transaminitis Mild Likely a sequelae of EtOH use Improving-repeat LFTs  periodically  Leukopenia/thrombocytopenia Mild-likely bone marrow suppression from alcohol use Should improve with ongoing alcohol cessation Repeat CBC periodically.  BMI: Estimated body mass index is 26.63 kg/m as calculated from the following:   Height as of this encounter: 5\' 7"  (1.702 m).   Weight as of this encounter: 77.1 kg.   Code status:   Code Status: Full Code   DVT Prophylaxis: enoxaparin (LOVENOX) injection 40 mg Start: 05/12/23 1000   Family Communication: None at bedside   Disposition Plan: Status is: Inpatient Remains inpatient appropriate because: Severity of illness   Planned Discharge Destination:Home   Diet: Diet Order             Diet regular Room service appropriate? Yes; Fluid consistency: Thin  Diet effective now                     Antimicrobial agents: Anti-infectives (From admission, onward)    None        MEDICATIONS: Scheduled Meds:  diazepam  5 mg Oral Q6H   docusate sodium  100 mg Oral BID   enoxaparin (LOVENOX) injection  40 mg Subcutaneous Q24H   folic acid  1 mg Oral Daily   LORazepam  0-4 mg Intravenous Q4H   Followed by   Melene Muller ON 05/14/2023] LORazepam  0-4 mg Intravenous Q8H   multivitamin with minerals  1 tablet Oral Daily   sodium chloride flush  3 mL Intravenous Q12H   thiamine  100 mg Oral Daily   Or   thiamine  100 mg Intravenous Daily   Continuous Infusions:  lactated ringers 75 mL/hr at 05/12/23 1012   PRN Meds:.acetaminophen, bisacodyl, hydrALAZINE, LORazepam **OR** LORazepam, ondansetron **OR** ondansetron (ZOFRAN) IV, polyethylene glycol   I have personally reviewed following labs and imaging studies  LABORATORY DATA: CBC: Recent Labs  Lab 05/12/23 0251 05/13/23 0357  WBC 3.1* 3.7*  NEUTROABS 1.8  --   HGB 13.5 12.9*  HCT 39.0 38.7*  MCV 99.7 103.8*  PLT 166 143*    Basic Metabolic Panel: Recent Labs  Lab 05/12/23 0433 05/13/23 0357  NA 135 138  K 3.6 3.7  CL 104 104  CO2  20* 22  GLUCOSE 94 89  BUN 10 14  CREATININE 0.94 1.02  CALCIUM 9.0 9.3    GFR: Estimated Creatinine Clearance: 95.4 mL/min (by C-G formula based on SCr of 1.02 mg/dL).  Liver Function Tests: Recent Labs  Lab 05/12/23 0433 05/13/23 0357  AST 105* 72*  ALT 90* 77*  ALKPHOS 47 44  BILITOT 0.8 1.0  PROT 6.3* 5.8*  ALBUMIN 4.0 3.6   No results for input(s): "LIPASE", "AMYLASE" in the last 168 hours. No results for input(s): "AMMONIA" in the last 168 hours.  Coagulation Profile: No results for input(s): "INR", "PROTIME" in the last 168 hours.  Cardiac Enzymes: No results for input(s): "CKTOTAL", "CKMB", "CKMBINDEX", "TROPONINI" in the last 168 hours.  BNP (last 3 results) No results for input(s): "PROBNP" in the last 8760 hours.  Lipid Profile: No results for input(s): "CHOL", "HDL", "LDLCALC", "TRIG", "CHOLHDL", "LDLDIRECT" in the last 72 hours.  Thyroid Function Tests: No results for input(s): "TSH", "T4TOTAL", "FREET4", "T3FREE", "THYROIDAB" in the last 72 hours.  Anemia Panel: No results for input(s): "VITAMINB12", "FOLATE", "FERRITIN", "TIBC", "IRON", "RETICCTPCT" in the last 72 hours.  Urine analysis:    Component Value Date/Time   COLORURINE YELLOW 03/28/2023 0845   APPEARANCEUR CLEAR 03/28/2023 0845   LABSPEC 1.014 03/28/2023 0845   PHURINE 6.0 03/28/2023 0845   GLUCOSEU NEGATIVE 03/28/2023 0845   HGBUR NEGATIVE 03/28/2023 0845   BILIRUBINUR NEGATIVE 03/28/2023 0845   KETONESUR 5 (A) 03/28/2023 0845   PROTEINUR NEGATIVE 03/28/2023 0845   NITRITE NEGATIVE 03/28/2023 0845   LEUKOCYTESUR NEGATIVE 03/28/2023 0845    Sepsis Labs: Lactic Acid, Venous    Component Value Date/Time   LATICACIDVEN 2.9 (HH) 12/10/2022 1402    MICROBIOLOGY: No results found for this or any previous visit (from the past 240 hour(s)).  RADIOLOGY STUDIES/RESULTS: No results found.   LOS: 1 day   Jeoffrey Massed, MD  [facility]    To contact the attending  provider between 7A-7P or the covering provider during after hours 7P-7A, please log into the web site [email or web address] and access using universal Box password for that web site. If you do not have the password, please call the hospital operator.  05/13/2023, 9:45 AM

## 2023-05-14 DIAGNOSIS — F10930 Alcohol use, unspecified with withdrawal, uncomplicated: Secondary | ICD-10-CM | POA: Diagnosis not present

## 2023-05-14 DIAGNOSIS — I10 Essential (primary) hypertension: Secondary | ICD-10-CM

## 2023-05-14 LAB — COMPREHENSIVE METABOLIC PANEL
ALT: 93 U/L — ABNORMAL HIGH (ref 0–44)
AST: 93 U/L — ABNORMAL HIGH (ref 15–41)
Albumin: 4 g/dL (ref 3.5–5.0)
Alkaline Phosphatase: 49 U/L (ref 38–126)
Anion gap: 11 (ref 5–15)
BUN: 12 mg/dL (ref 6–20)
CO2: 23 mmol/L (ref 22–32)
Calcium: 9.3 mg/dL (ref 8.9–10.3)
Chloride: 104 mmol/L (ref 98–111)
Creatinine, Ser: 1.02 mg/dL (ref 0.61–1.24)
GFR, Estimated: >60 mL/min (ref >60)
Glucose, Bld: 81 mg/dL (ref 70–99)
Potassium: 3.7 mmol/L (ref 3.5–5.1)
Sodium: 138 mmol/L (ref 135–145)
Total Bilirubin: 0.9 mg/dL (ref 0.3–1.2)
Total Protein: 6.3 g/dL — ABNORMAL LOW (ref 6.5–8.1)

## 2023-05-14 LAB — PHOSPHORUS: Phosphorus: 2.7 mg/dL (ref 2.5–4.6)

## 2023-05-14 LAB — MAGNESIUM: Magnesium: 1.9 mg/dL (ref 1.7–2.4)

## 2023-05-14 MED ORDER — CLONIDINE HCL 0.2 MG PO TABS
0.2000 mg | ORAL_TABLET | Freq: Once | ORAL | Status: AC
  Administered 2023-05-14: 0.2 mg via ORAL
  Filled 2023-05-14: qty 1

## 2023-05-14 MED ORDER — LISINOPRIL 20 MG PO TABS
20.0000 mg | ORAL_TABLET | Freq: Every day | ORAL | Status: DC
  Administered 2023-05-14: 20 mg via ORAL
  Filled 2023-05-14: qty 1

## 2023-05-14 MED ORDER — DIAZEPAM 5 MG PO TABS: 5.0000 mg | ORAL_TABLET | Freq: Two times a day (BID) | ORAL | Status: DC

## 2023-05-14 NOTE — Discharge Summary (Signed)
PATIENT DETAILS Name: Adam [name] Services. Age: 37 y.o. Sex: male Date of Birth: Dec 31, 1985 MRN: [id number]. Admitting Physician: Hannah Beat, MD PCP:Pcp, No  Admit Date: 05/12/2023 Discharge date: 05/14/2023  Recommendations for Outpatient Follow-up:  Follow up with PCP in 1-2 weeks Please obtain CMP/CBC in one week  Admitted From:  Home  Disposition: Home   Discharge Condition: good  CODE STATUS:   Code Status: Full Code   Diet recommendation:  Diet Order             Diet - low sodium heart healthy           Diet regular Room service appropriate? Yes; Fluid consistency: Thin  Diet effective now                    Brief Summary: Patient is a 37 y.o.  male with tree of EtOH use, HTN-presented with EtOH withdrawal symptoms.   Significant events: 6/20>> admit to [facility]   Significant studies: None   Significant microbiology data: None   Procedures: None   Consults: None  Brief Hospital Course: EtOH withdrawal Improved-last drink was on 6/19 No tremors/no hallucinations Plan was to taper Valium slowly-however he is wanting to go home today (wanted to go home yesterday as well-stated after I asked him to)-claims he really does not want to be hospitalized any longer-and has Librium taper at home from a prior hospitalization.  He claims he intends not to drink any further. Since he is awake/alert-he is being discharged home at his own request. Extensive counseling done regarding importance of stopping alcohol use.  HTN BP slowly creeping up Resume lisinopril.   Transaminitis Mild Likely a sequelae of EtOH use Improving-repeat LFTs in 1 week at PCPs office   Leukopenia/thrombocytopenia Mild-likely bone marrow suppression from alcohol use Should improve with ongoing alcohol cessation Repeat CBC in 1 week at PCPs office   BMI: Estimated body mass index is 26.63 kg/m as calculated from the following:   Height as of this encounter: 5\' 7"   (1.702 m).   Weight as of this encounter: 77.1 kg.   Obesity: Estimated body mass index is 26.63 kg/m as calculated from the following:   Height as of this encounter: 5\' 7"  (1.702 m).   Weight as of this encounter: 77.1 kg.   Discharge Diagnoses:  Principal Problem:   Alcohol withdrawal [facility])  Discharge Instructions:  Activity:  As tolerated   Discharge Instructions     Call MD for:  extreme fatigue   Complete by: As directed    Call MD for:  persistant dizziness or light-headedness   Complete by: As directed    Diet - low sodium heart healthy   Complete by: As directed    Discharge instructions   Complete by: As directed    Follow with Primary MD in 1-2 weeks  Please get a complete blood count and chemistry panel checked by your Primary MD at your next visit, and again as instructed by your Primary MD.  Get Medicines reviewed and adjusted: Please take all your medications with you for your next visit with your Primary MD  Laboratory/radiological data: Please request your Primary MD to go over all hospital tests and procedure/radiological results at the follow up, please ask your Primary MD to get all Hospital records sent to his/her office.  In some cases, they will be blood work, cultures and biopsy results pending at the time of your discharge. Please request that your primary care  M.D. follows up on these results.  Also Note the following: If you experience worsening of your admission symptoms, develop shortness of breath, life threatening emergency, suicidal or homicidal thoughts you must seek medical attention immediately by calling 911 or calling your MD immediately  if symptoms less severe.  You must read complete instructions/literature along with all the possible adverse reactions/side effects for all the Medicines you take and that have been prescribed to you. Take any new Medicines after you have completely understood and accpet all the possible adverse  reactions/side effects.   Do not drive when taking Pain medications or sleeping medications (Benzodaizepines)  Do not take more than prescribed Pain, Sleep and Anxiety Medications. It is not advisable to combine anxiety,sleep and pain medications without talking with your primary care practitioner  Special Instructions: If you have smoked or chewed Tobacco  in the last 2 yrs please stop smoking, stop any regular Alcohol  and or any Recreational drug use.  Wear Seat belts while driving.  Please note: You were cared for by a hospitalist during your hospital stay. Once you are discharged, your primary care physician will handle any further medical issues. Please note that NO REFILLS for any discharge medications will be authorized once you are discharged, as it is imperative that you return to your primary care physician (or establish a relationship with a primary care physician if you do not have one) for your post hospital discharge needs so that they can reassess your need for medications and monitor your lab values.   Increase activity slowly   Complete by: As directed       Allergies as of 05/14/2023       Reactions   Bee Venom Anaphylaxis        Medication List     TAKE these medications    folic acid 1 MG tablet Commonly known as: FOLVITE Take 1 tablet (1 mg total) by mouth daily.   lisinopril 30 MG tablet Commonly known as: ZESTRIL Take 1 tablet (30 mg total) by mouth daily.   multivitamin with minerals Tabs tablet Take 1 tablet by mouth daily.   thiamine 100 MG tablet Commonly known as: VITAMIN B1 Take 1 tablet (100 mg total) by mouth daily.   VITAMIN B12 PO Take 1 tablet by mouth daily.        Follow-up Information     Primary care practitioner. Schedule an appointment as soon as possible for a visit in 1 week(s).                 Allergies  Allergen Reactions   Bee Venom Anaphylaxis     Other Procedures/Studies: No results  found.   TODAY-DAY OF DISCHARGE:  Subjective:   Adam [name] today has no headache,no chest abdominal pain,no new weakness tingling or numbness, feels much better wants to go home today.   Objective:   Blood pressure (!) 150/104, pulse 80, temperature 98.4 F (36.9 C), temperature source Oral, resp. rate 16, height 5\' 7"  (1.702 m), weight 77.1 kg, SpO2 97 %.  Intake/Output Summary (Last 24 hours) at 05/14/2023 0843 Last data filed at 05/14/2023 0200 Gross per 24 hour  Intake 1000 ml  Output --  Net 1000 ml   Filed Weights   05/12/23 0236  Weight: 77.1 kg    Exam: Awake Alert, Oriented *3, No new F.N deficits, Normal affect [place].AT,PERRAL Supple Neck,No JVD, No cervical lymphadenopathy appriciated.  Symmetrical Chest wall movement, Good air movement bilaterally, CTAB RRR,No Gallops,Rubs  or new Murmurs, No Parasternal Heave +ve B.Sounds, Abd Soft, Non tender, No organomegaly appriciated, No rebound -guarding or rigidity. No Cyanosis, Clubbing or edema, No new Rash or bruise   PERTINENT RADIOLOGIC STUDIES: No results found.   PERTINENT LAB RESULTS: CBC: Recent Labs    05/12/23 0251 05/13/23 0357  WBC 3.1* 3.7*  HGB 13.5 12.9*  HCT 39.0 38.7*  PLT 166 143*   CMET CMP     Component Value Date/Time   NA 138 05/14/2023 0317   NA 140 05/14/2012 1916   K 3.7 05/14/2023 0317   K 4.4 05/14/2012 1916   CL 104 05/14/2023 0317   CL 102 05/14/2012 1916   CO2 23 05/14/2023 0317   CO2 31 05/14/2012 1916   GLUCOSE 81 05/14/2023 0317   GLUCOSE 79 05/14/2012 1916   BUN 12 05/14/2023 0317   BUN 10 05/14/2012 1916   CREATININE 1.02 05/14/2023 0317   CREATININE 0.89 05/14/2012 1916   CALCIUM 9.3 05/14/2023 0317   CALCIUM 9.3 05/14/2012 1916   PROT 6.3 (L) 05/14/2023 0317   PROT 8.0 05/14/2012 1916   ALBUMIN 4.0 05/14/2023 0317   ALBUMIN 4.6 05/14/2012 1916   AST 93 (H) 05/14/2023 0317   AST 16 05/14/2012 1916   ALT 93 (H) 05/14/2023 0317   ALT 22 05/14/2012 1916    ALKPHOS 49 05/14/2023 0317   ALKPHOS 71 05/14/2012 1916   BILITOT 0.9 05/14/2023 0317   BILITOT 0.7 05/14/2012 1916   GFRNONAA >60 05/14/2023 0317   GFRNONAA >60 05/14/2012 1916    GFR Estimated Creatinine Clearance: 95.4 mL/min (by C-G formula based on SCr of 1.02 mg/dL). No results for input(s): "LIPASE", "AMYLASE" in the last 72 hours. No results for input(s): "CKTOTAL", "CKMB", "CKMBINDEX", "TROPONINI" in the last 72 hours. Invalid input(s): "POCBNP" No results for input(s): "DDIMER" in the last 72 hours. No results for input(s): "HGBA1C" in the last 72 hours. No results for input(s): "CHOL", "HDL", "LDLCALC", "TRIG", "CHOLHDL", "LDLDIRECT" in the last 72 hours. No results for input(s): "TSH", "T4TOTAL", "T3FREE", "THYROIDAB" in the last 72 hours.  Invalid input(s): "FREET3" No results for input(s): "VITAMINB12", "FOLATE", "FERRITIN", "TIBC", "IRON", "RETICCTPCT" in the last 72 hours. Coags: No results for input(s): "INR" in the last 72 hours.  Invalid input(s): "PT" Microbiology: No results found for this or any previous visit (from the past 240 hour(s)).  FURTHER DISCHARGE INSTRUCTIONS:  Get Medicines reviewed and adjusted: Please take all your medications with you for your next visit with your Primary MD  Laboratory/radiological data: Please request your Primary MD to go over all hospital tests and procedure/radiological results at the follow up, please ask your Primary MD to get all Hospital records sent to his/her office.  In some cases, they will be blood work, cultures and biopsy results pending at the time of your discharge. Please request that your primary care M.D. goes through all the records of your hospital data and follows up on these results.  Also Note the following: If you experience worsening of your admission symptoms, develop shortness of breath, life threatening emergency, suicidal or homicidal thoughts you must seek medical attention immediately by  calling 911 or calling your MD immediately  if symptoms less severe.  You must read complete instructions/literature along with all the possible adverse reactions/side effects for all the Medicines you take and that have been prescribed to you. Take any new Medicines after you have completely understood and accpet all the possible adverse reactions/side effects.   Do not  drive when taking Pain medications or sleeping medications (Benzodaizepines)  Do not take more than prescribed Pain, Sleep and Anxiety Medications. It is not advisable to combine anxiety,sleep and pain medications without talking with your primary care practitioner  Special Instructions: If you have smoked or chewed Tobacco  in the last 2 yrs please stop smoking, stop any regular Alcohol  and or any Recreational drug use.  Wear Seat belts while driving.  Please note: You were cared for by a hospitalist during your hospital stay. Once you are discharged, your primary care physician will handle any further medical issues. Please note that NO REFILLS for any discharge medications will be authorized once you are discharged, as it is imperative that you return to your primary care physician (or establish a relationship with a primary care physician if you do not have one) for your post hospital discharge needs so that they can reassess your need for medications and monitor your lab values.  Total Time spent coordinating discharge including counseling, education and face to face time equals greater than 30 minutes.  SignedJeoffrey Massed 05/14/2023 8:43 AM

## 2023-05-14 NOTE — Progress Notes (Signed)
Feels much better Wants to go home-he is completely awake and alert-no tremors Claims he has Librium taper from prior admission that he will take- claims that he is very committed to not drinking anymore. Since awake/alert-and requesting discharge-he will be discharged home at his own request. See discharge summary for details.

## 2023-06-14 ENCOUNTER — Other Ambulatory Visit (HOSPITAL_COMMUNITY): Payer: Self-pay

## 2023-06-21 ENCOUNTER — Other Ambulatory Visit: Payer: Self-pay

## 2023-06-21 ENCOUNTER — Encounter (HOSPITAL_COMMUNITY): Payer: Self-pay

## 2023-06-21 ENCOUNTER — Emergency Department (HOSPITAL_COMMUNITY)
Admission: EM | Admit: 2023-06-21 | Discharge: 2023-06-22 | Disposition: A | Discharge status: Home / Self Care | Payer: Medicaid Other | Attending: Emergency Medicine | Admitting: Emergency Medicine

## 2023-06-21 DIAGNOSIS — F10920 Alcohol use, unspecified with intoxication, uncomplicated: Secondary | ICD-10-CM | POA: Insufficient documentation

## 2023-06-21 DIAGNOSIS — Y908 Blood alcohol level of 240 mg/100 ml or more: Secondary | ICD-10-CM | POA: Diagnosis not present

## 2023-06-21 DIAGNOSIS — I10 Essential (primary) hypertension: Secondary | ICD-10-CM | POA: Insufficient documentation

## 2023-06-21 NOTE — ED Triage Notes (Signed)
Pt reports he drinks a bottle of a 5th of whisky a day, last drank yesterday. He reports he is withdrawing, states he is hallucinating by seeing things and is having blurred vision Last seizure a month ago. He reports chest pain and tinnitus, as well.

## 2023-06-22 ENCOUNTER — Emergency Department (HOSPITAL_COMMUNITY): Payer: Medicaid Other

## 2023-06-22 LAB — TROPONIN I (HIGH SENSITIVITY): Troponin I (High Sensitivity): <2 ng/L (ref <18)

## 2023-06-22 MED ORDER — THIAMINE MONONITRATE 100 MG PO TABS
100.0000 mg | ORAL_TABLET | Freq: Every day | ORAL | Status: DC
  Administered 2023-06-22: 100 mg via ORAL
  Filled 2023-06-22: qty 1

## 2023-06-22 MED ORDER — LORAZEPAM 1 MG PO TABS: 1.0000 mg | ORAL_TABLET | Freq: Two times a day (BID) | ORAL | Status: DC

## 2023-06-22 MED ORDER — SODIUM CHLORIDE 0.9 % IV BOLUS
1000.0000 mL | Freq: Once | INTRAVENOUS | Status: AC
  Administered 2023-06-22: 1000 mL via INTRAVENOUS

## 2023-06-22 MED ORDER — LORAZEPAM 1 MG PO TABS
1.0000 mg | ORAL_TABLET | Freq: Four times a day (QID) | ORAL | Status: DC
  Administered 2023-06-22: 1 mg via ORAL

## 2023-06-22 MED ORDER — PROCHLORPERAZINE EDISYLATE 10 MG/2ML IJ SOLN
5.0000 mg | Freq: Once | INTRAMUSCULAR | Status: AC
  Administered 2023-06-22: 5 mg via INTRAVENOUS
  Filled 2023-06-22: qty 2

## 2023-06-22 MED ORDER — LORAZEPAM 1 MG PO TABS: 1.0000 mg | ORAL_TABLET | Freq: Once | ORAL | Status: DC

## 2023-06-22 MED ORDER — CHLORDIAZEPOXIDE HCL 25 MG PO CAPS: ORAL_CAPSULE | ORAL | 0 refills | Status: DC

## 2023-06-22 MED ORDER — LORAZEPAM 1 MG PO TABS
0.0000 mg | ORAL_TABLET | Freq: Four times a day (QID) | ORAL | Status: DC
  Filled 2023-06-22: qty 1

## 2023-06-22 MED ORDER — THIAMINE HCL 100 MG/ML IJ SOLN: 100.0000 mg | Freq: Every day | INTRAMUSCULAR | Status: DC

## 2023-06-22 NOTE — ED Provider Notes (Cosign Needed Addendum)
[place] EMERGENCY DEPARTMENT AT [facility] Provider Note   CSN: [id number] Arrival date & time: 06/21/23  2347     History  Chief Complaint  Patient presents with   Alcohol Problem    Adam [name]. is a 37 y.o. male.  HPI   Patient with medical history including hypertension, alcohol dependency, seizures, presenting with complaints of alcohol withdrawal.  Patient states that over the last month and a half he has been drinking about 1.5 pint of liquor daily, states that his last drink was yesterday (Tuesday) early in the morning, states about 2pm on tuesday started to have withdrawals, full body aches, has a headache, this tremulous and states that he has visual changes/hallucinations.  He states that over the last 3 days his vision is "blurry" bilaterally, states that will come and go, last 30 minutes and resolved, mainly come on when he has a headache, no associated paresthesias or weakness upper/ lower extremities, also notes that he is having hallucinations, states they are visual, he states he just sees "things", when I asked him to elaborate he will not describe them, he denies any SI or HI, states that he needs help with alcohol use and will stop today.  I reviewed patient's chart he has been seen multiple times for similar presentation, most recently was seen and admitted on 06/20 discharged on the 22nd, reading patient's note he also endorsed hallucination as well as blurred vision, patient was placed on CIWA protocol, and patient was discharged later on a Librium taper.  Patient has not actually gone to  Home Medications Prior to Admission medications   Medication Sig Start Date End Date Taking? Authorizing Provider  chlordiazePOXIDE (LIBRIUM) 25 MG capsule 50mg  PO TID x 1D, then 25-50mg  PO BID X 1D, then 25-50mg  PO QD X 1D 06/22/23  Yes Carroll Sage, PA-C  Cyanocobalamin (VITAMIN B12 PO) Take 1 tablet by mouth daily.    [provider]   folic acid (FOLVITE) 1 MG tablet Take 1 tablet (1 mg total) by mouth daily. 09/13/22   Rai, Ripudeep K, MD  lisinopril (ZESTRIL) 30 MG tablet Take 1 tablet (30 mg total) by mouth daily. 03/30/23   Lewie Chamber, MD  Multiple Vitamin (MULTIVITAMIN WITH MINERALS) TABS tablet Take 1 tablet by mouth daily. Patient not taking: Reported on 05/12/2023 03/07/23   Lockie Mola, MD  thiamine (VITAMIN B1) 100 MG tablet Take 1 tablet (100 mg total) by mouth daily. 03/07/23   Lockie Mola, MD      Allergies    Bee venom    Review of Systems   Review of Systems  Constitutional:  Negative for chills and fever.  Eyes:  Positive for visual disturbance.  Respiratory:  Negative for shortness of breath.   Cardiovascular:  Negative for chest pain.  Gastrointestinal:  Negative for abdominal pain.  Neurological:  Positive for headaches.  Psychiatric/Behavioral:  Positive for hallucinations.     Physical Exam Updated Vital Signs BP 116/67   Pulse 94   Temp 97.9 F (36.6 C) (Oral)   Resp 17   Ht 5\' 8"  (1.727 m)   Wt 77.1 kg   SpO2 96%   BMI 25.85 kg/m  Physical Exam Vitals and nursing note reviewed.  Constitutional:      General: He is not in acute distress.    Appearance: He is not ill-appearing.     Comments: Patient is found resting comfortably, vital signs reassuring, showing no evidence of acute distress no  obvious tremors while rest during my exam.  HENT:     Head: Normocephalic and atraumatic.     Nose: No congestion.  Eyes:     Conjunctiva/sclera: Conjunctivae normal.  Cardiovascular:     Rate and Rhythm: Normal rate and regular rhythm.     Pulses: Normal pulses.     Heart sounds: No murmur heard.    No friction rub. No gallop.  Pulmonary:     Effort: No respiratory distress.     Breath sounds: No wheezing, rhonchi or rales.  Abdominal:     Palpations: Abdomen is soft.     Tenderness: There is no abdominal tenderness. There is no right CVA tenderness or left CVA tenderness.   Skin:    General: Skin is warm and dry.  Neurological:     Mental Status: He is alert.     GCS: GCS eye subscore is 4. GCS verbal subscore is 5. GCS motor subscore is 6.     Cranial Nerves: Cranial nerves 2-12 are intact.     Sensory: Sensation is intact.     Motor: No weakness.     Coordination: Romberg sign negative. Finger-Nose-Finger Test normal.     Comments: Cranial nerves II through XII grossly intact no difficulty with word finding following his up commands there is no unilateral weakness present.  Psychiatric:        Mood and Affect: Mood normal.     Comments: Patient denies HI or SI, he is responding appropriately, does not appear to be responding internal stimuli, endorses visual hallucinations, on my exam patient will occasion have tremors in the upper extremity, when he is distracted tremors resolved.  Patient makes good eye contact, has good insight.     ED Results / Procedures / Treatments   Labs (all labs ordered are listed, but only abnormal results are displayed) Labs Reviewed  COMPREHENSIVE METABOLIC PANEL - Abnormal; Notable for the following components:      Result Value   CO2 20 (*)    Glucose, Bld 109 (*)    Creatinine, Ser 1.43 (*)    AST 121 (*)    ALT 131 (*)    Anion gap 16 (*)    All other components within normal limits  ETHANOL - Abnormal; Notable for the following components:   Alcohol, Ethyl (B) 272 (*)    All other components within normal limits  CBC - Abnormal; Notable for the following components:   MCH 34.3 (*)    All other components within normal limits  RAPID URINE DRUG SCREEN, HOSP PERFORMED  TROPONIN I (HIGH SENSITIVITY)  TROPONIN I (HIGH SENSITIVITY)    EKG None  Radiology CT Head Wo Contrast  Result Date: 06/22/2023 CLINICAL DATA:  Headache with increasing frequency or severity. EXAM: CT HEAD WITHOUT CONTRAST TECHNIQUE: Contiguous axial images were obtained from the base of the skull through the vertex without intravenous  contrast. RADIATION DOSE REDUCTION: This exam was performed according to the departmental dose-optimization program which includes automated exposure control, adjustment of the mA and/or kV according to patient size and/or use of iterative reconstruction technique. COMPARISON:  04/10/2023 FINDINGS: Brain: No evidence of acute infarction, hemorrhage, hydrocephalus, extra-axial collection or mass lesion/mass effect. Stable, generalized low cerebral volume for age. Vascular: No hyperdense vessel or unexpected calcification. Skull: Normal. Negative for fracture or focal lesion. Sinuses/Orbits: No acute finding. IMPRESSION: Stable head CT.  No explanation for headache. Electronically Signed   By: Tiburcio Pea M.D.   On: 06/22/2023 04:25  DG Chest 2 View  Result Date: 06/22/2023 CLINICAL DATA:  chest pain EXAM: CHEST - 2 VIEW COMPARISON:  Chest x-ray 03/04/2023 FINDINGS: The heart and mediastinal contours are within normal limits. Low lung volumes. No focal consolidation. No pulmonary edema. No pleural effusion. No pneumothorax. No acute osseous abnormality. IMPRESSION: Low lung volumes with no active cardiopulmonary disease. Electronically Signed   By: Tish Frederickson M.D.   On: 06/22/2023 01:15    Procedures Procedures    Medications Ordered in ED Medications  LORazepam (ATIVAN) tablet 1 mg (1 mg Oral Given 06/22/23 0440)    Or  LORazepam (ATIVAN) tablet 0-4 mg ( Oral See Alternative 06/22/23 0440)  LORazepam (ATIVAN) tablet 1 mg (has no administration in time range)    Or  LORazepam (ATIVAN) tablet 1 mg (has no administration in time range)  thiamine (VITAMIN B1) tablet 100 mg (100 mg Oral Given 06/22/23 0443)    Or  thiamine (VITAMIN B1) injection 100 mg ( Intravenous See Alternative 06/22/23 0443)  sodium chloride 0.9 % bolus 1,000 mL (0 mLs Intravenous Stopped 06/22/23 0545)  prochlorperazine (COMPAZINE) injection 5 mg (5 mg Intravenous Given 06/22/23 0444)    ED Course/ Medical Decision  Making/ A&P                                 Medical Decision Making Amount and/or Complexity of Data Reviewed Labs: ordered. Radiology: ordered.  Risk OTC drugs. Prescription drug management.   This patient presents to the ED for concern of alcohol withdrawal, this involves an extensive number of treatment options, and is a complaint that carries with it a high risk of complications and morbidity.  The differential diagnosis includes metabolic abnormality, HI/SI, withdraws    Additional history obtained:  Additional history obtained from N/A External records from outside source obtained and reviewed including recent hospitalization   Co morbidities that complicate the patient evaluation  Alcohol dependency  Social Determinants of Health:  No PCP    Lab Tests:  I Ordered, and personally interpreted labs.  The pertinent results include: CBC is unremarkable, CMP reveals CO2 of 20, glucose 109, creatinine 1.4, AST 121 ALT 131 both of which are at baseline and gap 16 ethanol 272, drug screen negative, negative delta troponin   Imaging Studies ordered:  I ordered imaging studies including chest x-ray, CT head I independently visualized and interpreted imaging which showed x-ray negative I agree with the radiologist interpretation   Cardiac Monitoring:  The patient was maintained on a cardiac monitor.  I personally viewed and interpreted the cardiac monitored which showed an underlying rhythm of: EKG without signs of ischemia   Medicines ordered and prescription drug management:  I ordered medication including benzodiazepine, thiamine, migraine cocktail I have reviewed the patients home medicines and have made adjustments as needed  Critical Interventions:  N/A   Reevaluation:  Presents with withdrawals triage obtain basic lab workup imaging which I personally viewed, shows likely ketoacidosis, had no focal deficit on my exam but endorses" blurred" vision and  headache, suspect likely migraine, will provide a migraine cocktail, obtain CT head for further evaluation, also start him on CIWA protocol  On reassessment patient was found asleep, nontachycardic, nontremulous on  my exam, no evidence of withdrawals, he is tolerating p.o., states he is feeling better, he is agreement for discharge at this time.  Consultations Obtained:  N/A    Test Considered:  N/A  Rule out low suspicion for internal head bleed and or mass as CT imaging is negative for acute findings.  Low suspicion for CVA she has no focal deficit present my exam.  Low suspicion for dissection of the vertebral or carotid artery as presentation atypical of etiology.  Low suspicion for meningitis as she has no meningeal sign present.  Doubt patient needs to be admitted for alcohol withdrawals as he is nontremulous my exam, vital signs are reassuring, CIWA is less than 3.  I doubt patient needs to be admitted for acute psychiatric emergency denies SI or HI, does not appear to be respond to internal stimuli.     Dispostion and problem list  After consideration of the diagnostic results and the patients response to treatment, I feel that the patent would benefit from discharge.  Alcohol intoxication- uncomplicated, will start on Librium taper, refer him to outpatient resources.            Final Clinical Impression(s) / ED Diagnoses Final diagnoses:  Alcoholic intoxication without complication [facility])    Rx / DC Orders ED Discharge Orders          Ordered    chlordiazePOXIDE (LIBRIUM) 25 MG capsule        06/22/23 0643              Carroll Sage, PA-C 06/22/23 0644    Carroll Sage, PA-C 06/22/23 1610    Tilden Fossa, MD 06/23/23 [phone]

## 2023-06-22 NOTE — Discharge Instructions (Signed)
I have given you medication called Librium which will help with your alcohol withdrawals, please take as prescribed.  I have given the outpatient resources for alcohol use please review the also follow-up with the behavioral health urgent care.  Come back to the emergency department if you develop chest pain, shortness of breath, severe abdominal pain, uncontrolled nausea, vomiting, diarrhea.

## 2023-07-05 ENCOUNTER — Other Ambulatory Visit (HOSPITAL_COMMUNITY): Payer: Self-pay

## 2023-07-05 ENCOUNTER — Other Ambulatory Visit: Payer: Self-pay | Admitting: Family Medicine

## 2023-07-07 ENCOUNTER — Ambulatory Visit (INDEPENDENT_AMBULATORY_CARE_PROVIDER_SITE_OTHER): Payer: Medicaid Other | Admitting: Family Medicine

## 2023-07-07 ENCOUNTER — Encounter: Payer: Self-pay | Admitting: Family Medicine

## 2023-07-07 VITALS — BP 127/82 | HR 77 | Temp 98.3°F | Resp 20 | Ht 67.0 in | Wt 173.7 lb

## 2023-07-07 DIAGNOSIS — I10 Essential (primary) hypertension: Secondary | ICD-10-CM

## 2023-07-07 DIAGNOSIS — H6123 Impacted cerumen, bilateral: Secondary | ICD-10-CM | POA: Diagnosis not present

## 2023-07-07 DIAGNOSIS — F1090 Alcohol use, unspecified, uncomplicated: Secondary | ICD-10-CM

## 2023-07-07 DIAGNOSIS — H9313 Tinnitus, bilateral: Secondary | ICD-10-CM

## 2023-07-07 DIAGNOSIS — Z7689 Persons encountering health services in other specified circumstances: Secondary | ICD-10-CM

## 2023-07-07 MED ORDER — LISINOPRIL 30 MG PO TABS
30.0000 mg | ORAL_TABLET | Freq: Every day | ORAL | 1 refills | Status: DC
Start: 2023-07-07 — End: 2024-01-19

## 2023-07-07 MED ORDER — FOLIC ACID 1 MG PO TABS: 1.0000 mg | ORAL_TABLET | Freq: Every day | ORAL | 1 refills | Status: AC

## 2023-07-07 MED ORDER — THIAMINE HCL 100 MG PO TABS: 100.0000 mg | ORAL_TABLET | Freq: Every day | ORAL | 1 refills | Status: AC

## 2023-07-07 NOTE — Patient Instructions (Addendum)
[facility]   [address], [place], [place] [address] Phone: [phone]

## 2023-07-07 NOTE — Progress Notes (Signed)
New Patient Office Visit  Subjective    Patient ID: Adam [name]., male    DOB: 08/08/1986  Age: 37 y.o. MRN: [id number]  CC:  Chief Complaint  Patient presents with   Establish Care    Patient is here to establish care, patient states that he has never had a primary care provider and states that he needs refills on his BP medication as well as his medication for alcohol withdrawals,he states that he also has ringing in both ears that began about 7 months and states that it is very painful at times the ringing is primarily in his right but affects both     HPI [name]. presents to establish care. Pt is new to me.  Pt reports he has lost his job in January 2024. He was always going to urgent care for his medical needs. He has insurance now and is here to establish care with PCP.   Ringing in his ears He reports it's been present for the last 7 months. Right side is worse than the left side. He reports the ringing is always present. He isn't exposed to loud noise. He has tried ear wax removal. He denies hearing loss.   Hypertension He has been taking Lisinopril 30 mg daily. He ran out yesterday and needs this refilled.   Hx of alcohol abuse He reports this isn't an issue at this time. He use to be on Librium for this. He reports he started drinking when his mother passed when he was 74. He has had multiple urgent care and ER visits for the alcohol abuse.   Outpatient Encounter Medications as of 07/07/2023  Medication Sig   chlordiazePOXIDE (LIBRIUM) 25 MG capsule 50mg  PO TID x 1D, then 25-50mg  PO BID X 1D, then 25-50mg  PO QD X 1D   Cyanocobalamin (VITAMIN B12 PO) Take 1 tablet by mouth daily.   folic acid (FOLVITE) 1 MG tablet Take 1 tablet (1 mg total) by mouth daily.   lisinopril (ZESTRIL) 30 MG tablet Take 1 tablet (30 mg total) by mouth daily.   Multiple Vitamin (MULTIVITAMIN WITH MINERALS) TABS tablet Take 1 tablet by mouth daily. (Patient not taking:  Reported on 05/12/2023)   thiamine (VITAMIN B1) 100 MG tablet Take 1 tablet (100 mg total) by mouth daily.   No facility-administered encounter medications on file as of 07/07/2023.    Past Medical History:  Diagnosis Date   Alcohol abuse    Eating disorder    history of bulemia   HYPERTENSION 06/25/2010   Seizure (HCC)    ETOH Related Seizure    History reviewed. No pertinent surgical history.  Family History  Problem Relation Age of Onset   Cancer Mother        lung   Hypertension Father     Social History   Socioeconomic History   Marital status: Married    Spouse name: Not on file   Number of children: Not on file   Years of education: Not on file   Highest education level: 10th grade  Occupational History   Not on file  Tobacco Use   Smoking status: Never    Passive exposure: Never   Smokeless tobacco: Never  Vaping Use   Vaping status: Never Used  Substance and Sexual Activity   Alcohol use: Yes    Alcohol/week: 70.0 standard drinks of alcohol    Types: 70 Shots of liquor per week   Drug use: Yes   Sexual  activity: Yes  Other Topics Concern   Not on file  Social History Narrative   ** Merged History Encounter **       Social Determinants of Health   Financial Resource Strain: High Risk (07/05/2023)   Overall Financial Resource Strain (CARDIA)    Difficulty of Paying Living Expenses: Very hard  Food Insecurity: Food Insecurity Present (07/05/2023)   Hunger Vital Sign    Worried About Running Out of Food in the Last Year: Often true    Ran Out of Food in the Last Year: Often true  Transportation Needs: Unmet Transportation Needs (07/05/2023)   PRAPARE - Administrator, Civil Service (Medical): Yes    Lack of Transportation (Non-Medical): Yes  Physical Activity: Sufficiently Active (07/05/2023)   Exercise Vital Sign    Days of Exercise per Week: 7 days    Minutes of Exercise per Session: 50 min  Stress: Stress Concern Present (07/05/2023)    [employer] of Occupational Health - Occupational Stress Questionnaire    Feeling of Stress : Very much  Social Connections: Unknown (07/05/2023)   Social Connection and Isolation Panel [NHANES]    Frequency of Communication with Friends and Family: Patient declined    Frequency of Social Gatherings with Friends and Family: Never    Attends Religious Services: Patient declined    Database administrator or Organizations: No    Attends Engineer, structural: Not on file    Marital Status: Married  Catering manager Violence: Not At Risk (05/12/2023)   Humiliation, Afraid, Rape, and Kick questionnaire    Fear of Current or Ex-Partner: No    Emotionally Abused: No    Physically Abused: No    Sexually Abused: No    Review of Systems  HENT:  Positive for tinnitus. Negative for ear pain and hearing loss.   All other systems reviewed and are negative.       Objective    BP 127/82   Pulse 77   Temp 98.3 F (36.8 C) (Oral)   Resp 20   Ht 5\' 7"  (1.702 m)   Wt 173 lb 11.2 oz (78.8 kg)   SpO2 98%   BMI 27.21 kg/m   Physical Exam Vitals and nursing note reviewed.  Constitutional:      Appearance: Normal appearance. He is normal weight.  HENT:     Head: Normocephalic and atraumatic.     Right Ear: There is impacted cerumen.     Left Ear: There is impacted cerumen.     Nose: Nose normal.     Mouth/Throat:     Mouth: Mucous membranes are moist.     Pharynx: Oropharynx is clear.  Eyes:     Pupils: Pupils are equal, round, and reactive to light.  Cardiovascular:     Rate and Rhythm: Normal rate and regular rhythm.     Pulses: Normal pulses.     Heart sounds: Normal heart sounds.  Pulmonary:     Effort: Pulmonary effort is normal.     Breath sounds: Normal breath sounds.  Skin:    General: Skin is warm.     Capillary Refill: Capillary refill takes less than 2 seconds.  Neurological:     General: No focal deficit present.     Mental Status: He is alert and  oriented to person, place, and time. Mental status is at baseline.  Psychiatric:        Mood and Affect: Mood normal.  Behavior: Behavior normal.        Thought Content: Thought content normal.        Judgment: Judgment normal.       Assessment & Plan:   Problem List Items Addressed This Visit   None Encounter to establish care with new doctor  Tinnitus of both ears -     Ear wax removal -     Ambulatory referral to ENT  Bilateral impacted cerumen -     Ear wax removal -     Ambulatory referral to ENT  Primary hypertension -     Lisinopril; Take 1 tablet (30 mg total) by mouth daily.  Dispense: 90 tablet; Refill: 1  Alcohol use disorder -     Ambulatory referral to Behavioral Health -     Thiamine HCl; Take 1 tablet (100 mg total) by mouth daily.  Dispense: 90 tablet; Refill: 1 -     Folic Acid; Take 1 tablet (1 mg total) by mouth daily.  Dispense: 90 tablet; Refill: 1   Tinnitus in both ears could be from impacted cerumen.  Irrigation attempted and was successful for right ear, unsuccessful on left ear. Referral to ENT made for further treatment options. Refilled Lisinopril 30mg  daily for HTN Referral to [facility] for continued alcohol abuse management. Also given pt contact information for [facility] for future needs.    No follow-ups on file.   Suzan Slick, MD

## 2023-09-01 ENCOUNTER — Emergency Department (HOSPITAL_COMMUNITY)
Admission: EM | Admit: 2023-09-01 | Discharge: 2023-09-01 | Discharge status: Against Medical Advice | Payer: MEDICAID | Attending: Emergency Medicine | Admitting: Emergency Medicine

## 2023-09-01 DIAGNOSIS — S01112A Laceration without foreign body of left eyelid and periocular area, initial encounter: Secondary | ICD-10-CM | POA: Diagnosis present

## 2023-09-01 DIAGNOSIS — Y92410 Unspecified street and highway as the place of occurrence of the external cause: Secondary | ICD-10-CM | POA: Insufficient documentation

## 2023-09-01 DIAGNOSIS — R519 Headache, unspecified: Secondary | ICD-10-CM | POA: Insufficient documentation

## 2023-09-01 DIAGNOSIS — Z5321 Procedure and treatment not carried out due to patient leaving prior to being seen by health care provider: Secondary | ICD-10-CM | POA: Insufficient documentation

## 2023-09-01 NOTE — ED Notes (Signed)
No room available for pt triage when pt arrived via EMS. Pt was waiting in a wheelchair in front of nurses station. When this RN stepped out of triage room with another pt, pt was assigned to available triage room pt was no longer in front of nurses station. Staff reported pt had to step out to see his wife.

## 2023-09-01 NOTE — ED Notes (Signed)
Pt walked out from triage area per NT Ladona Ridgel to give wife his duffle bag. Has not returned to triage. Called x1
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
## 2023-09-01 NOTE — ED Notes (Signed)
Upon arrival, pt stated that his wife was outside with their babies and he needed to hand her his bag. Pt stated he would be right back, would not allow staff to obtain VS or wait for a provider. Pt walked out and did not come back.

## 2023-09-08 ENCOUNTER — Inpatient Hospital Stay (HOSPITAL_COMMUNITY)
Admission: EM | Admit: 2023-09-08 | Discharge: 2023-09-10 | DRG: 894 | Discharge status: Against Medical Advice | Payer: MEDICAID | Attending: Internal Medicine | Admitting: Internal Medicine

## 2023-09-08 ENCOUNTER — Other Ambulatory Visit: Payer: Self-pay

## 2023-09-08 ENCOUNTER — Inpatient Hospital Stay (HOSPITAL_COMMUNITY): Payer: MEDICAID

## 2023-09-08 ENCOUNTER — Institutional Professional Consult (permissible substitution) (INDEPENDENT_AMBULATORY_CARE_PROVIDER_SITE_OTHER): Payer: MEDICAID | Admitting: Otolaryngology

## 2023-09-08 DIAGNOSIS — E871 Hypo-osmolality and hyponatremia: Secondary | ICD-10-CM | POA: Diagnosis present

## 2023-09-08 DIAGNOSIS — R7401 Elevation of levels of liver transaminase levels: Secondary | ICD-10-CM | POA: Diagnosis present

## 2023-09-08 DIAGNOSIS — F10932 Alcohol use, unspecified with withdrawal with perceptual disturbance: Secondary | ICD-10-CM | POA: Diagnosis present

## 2023-09-08 DIAGNOSIS — Z91030 Bee allergy status: Secondary | ICD-10-CM

## 2023-09-08 DIAGNOSIS — Z79899 Other long term (current) drug therapy: Secondary | ICD-10-CM | POA: Diagnosis not present

## 2023-09-08 DIAGNOSIS — F10929 Alcohol use, unspecified with intoxication, unspecified: Secondary | ICD-10-CM | POA: Diagnosis present

## 2023-09-08 DIAGNOSIS — F10920 Alcohol use, unspecified with intoxication, uncomplicated: Principal | ICD-10-CM

## 2023-09-08 DIAGNOSIS — I10 Essential (primary) hypertension: Secondary | ICD-10-CM | POA: Diagnosis present

## 2023-09-08 DIAGNOSIS — F419 Anxiety disorder, unspecified: Secondary | ICD-10-CM | POA: Diagnosis present

## 2023-09-08 DIAGNOSIS — F10232 Alcohol dependence with withdrawal with perceptual disturbance: Secondary | ICD-10-CM | POA: Diagnosis not present

## 2023-09-08 DIAGNOSIS — Z8249 Family history of ischemic heart disease and other diseases of the circulatory system: Secondary | ICD-10-CM | POA: Diagnosis not present

## 2023-09-08 DIAGNOSIS — F10220 Alcohol dependence with intoxication, uncomplicated: Secondary | ICD-10-CM | POA: Diagnosis present

## 2023-09-08 DIAGNOSIS — Y908 Blood alcohol level of 240 mg/100 ml or more: Secondary | ICD-10-CM | POA: Diagnosis present

## 2023-09-08 LAB — TROPONIN I (HIGH SENSITIVITY): Troponin I (High Sensitivity): <2 ng/L (ref <18)

## 2023-09-08 LAB — CBC
HCT: 46.8 % (ref 39.0–52.0)
Hemoglobin: 15.8 g/dL (ref 13.0–17.0)
MCH: 32.6 pg (ref 26.0–34.0)
MCHC: 33.8 g/dL (ref 30.0–36.0)
MCV: 96.7 fL (ref 80.0–100.0)
Platelets: 211 10*3/uL (ref 150–400)
RBC: 4.84 MIL/uL (ref 4.22–5.81)
RDW: 11.8 % (ref 11.5–15.5)
WBC: 6 10*3/uL (ref 4.0–10.5)
nRBC: 0 % (ref 0.0–0.2)

## 2023-09-08 LAB — COMPREHENSIVE METABOLIC PANEL
ALT: 72 U/L — ABNORMAL HIGH (ref 0–44)
AST: 78 U/L — ABNORMAL HIGH (ref 15–41)
Albumin: 4.7 g/dL (ref 3.5–5.0)
Alkaline Phosphatase: 52 U/L (ref 38–126)
Anion gap: 16 — ABNORMAL HIGH (ref 5–15)
BUN: 16 mg/dL (ref 6–20)
CO2: 22 mmol/L (ref 22–32)
Calcium: 8.9 mg/dL (ref 8.9–10.3)
Chloride: 94 mmol/L — ABNORMAL LOW (ref 98–111)
Creatinine, Ser: 0.97 mg/dL (ref 0.61–1.24)
GFR, Estimated: >60 mL/min (ref >60)
Glucose, Bld: 108 mg/dL — ABNORMAL HIGH (ref 70–99)
Potassium: 3.6 mmol/L (ref 3.5–5.1)
Sodium: 132 mmol/L — ABNORMAL LOW (ref 135–145)
Total Bilirubin: 0.7 mg/dL (ref 0.3–1.2)
Total Protein: 7.6 g/dL (ref 6.5–8.1)

## 2023-09-08 LAB — MRSA NEXT GEN BY PCR, NASAL: MRSA by PCR Next Gen: NOT DETECTED

## 2023-09-08 LAB — MAGNESIUM: Magnesium: 2.1 mg/dL (ref 1.7–2.4)

## 2023-09-08 LAB — PHOSPHORUS: Phosphorus: 3.2 mg/dL (ref 2.5–4.6)

## 2023-09-08 LAB — ETHANOL: Alcohol, Ethyl (B): 430 mg/dL (ref <10)

## 2023-09-08 MED ORDER — IOHEXOL 9 MG/ML PO SOLN
ORAL | Status: AC
  Filled 2023-09-08: qty 1000

## 2023-09-08 MED ORDER — ONDANSETRON HCL 4 MG PO TABS: 4.0000 mg | ORAL_TABLET | Freq: Four times a day (QID) | ORAL | Status: DC | PRN

## 2023-09-08 MED ORDER — MAGNESIUM SULFATE 2 GM/50ML IV SOLN
2.0000 g | Freq: Once | INTRAVENOUS | Status: AC
  Administered 2023-09-08: 2 g via INTRAVENOUS
  Filled 2023-09-08: qty 50

## 2023-09-08 MED ORDER — PHENOBARBITAL 32.4 MG PO TABS
64.8000 mg | ORAL_TABLET | Freq: Two times a day (BID) | ORAL | Status: AC
  Administered 2023-09-08 (×2): 64.8 mg via ORAL
  Filled 2023-09-08 (×2): qty 2

## 2023-09-08 MED ORDER — IOHEXOL 300 MG/ML  SOLN
100.0000 mL | Freq: Once | INTRAMUSCULAR | Status: AC | PRN
  Administered 2023-09-08: 100 mL via INTRAVENOUS

## 2023-09-08 MED ORDER — ACETAMINOPHEN 650 MG RE SUPP: 650.0000 mg | Freq: Four times a day (QID) | RECTAL | Status: DC | PRN

## 2023-09-08 MED ORDER — ONDANSETRON HCL 4 MG/2ML IJ SOLN
4.0000 mg | Freq: Once | INTRAMUSCULAR | Status: AC
  Administered 2023-09-08: 4 mg via INTRAVENOUS
  Filled 2023-09-08: qty 2

## 2023-09-08 MED ORDER — PHENOBARBITAL 16.2 MG PO TABS
16.2000 mg | ORAL_TABLET | Freq: Two times a day (BID) | ORAL | Status: DC
  Administered 2023-09-10: 16.2 mg via ORAL
  Filled 2023-09-08: qty 1

## 2023-09-08 MED ORDER — LISINOPRIL 10 MG PO TABS
10.0000 mg | ORAL_TABLET | Freq: Every day | ORAL | Status: DC
  Administered 2023-09-08 – 2023-09-10 (×3): 10 mg via ORAL
  Filled 2023-09-08 (×3): qty 1

## 2023-09-08 MED ORDER — ONDANSETRON HCL 4 MG/2ML IJ SOLN
4.0000 mg | Freq: Four times a day (QID) | INTRAMUSCULAR | Status: DC | PRN
  Administered 2023-09-09: 4 mg via INTRAVENOUS
  Filled 2023-09-08: qty 2

## 2023-09-08 MED ORDER — ORAL CARE MOUTH RINSE: 15.0000 mL | OROMUCOSAL | Status: DC | PRN

## 2023-09-08 MED ORDER — CHLORHEXIDINE GLUCONATE CLOTH 2 % EX PADS
6.0000 | MEDICATED_PAD | Freq: Every day | CUTANEOUS | Status: DC
  Administered 2023-09-08: 6 via TOPICAL

## 2023-09-08 MED ORDER — FOLIC ACID 1 MG PO TABS
1.0000 mg | ORAL_TABLET | Freq: Every day | ORAL | Status: DC
  Administered 2023-09-08 – 2023-09-10 (×3): 1 mg via ORAL
  Filled 2023-09-08 (×3): qty 1

## 2023-09-08 MED ORDER — THIAMINE MONONITRATE 100 MG PO TABS
100.0000 mg | ORAL_TABLET | Freq: Every day | ORAL | Status: DC
  Administered 2023-09-09 – 2023-09-10 (×2): 100 mg via ORAL
  Filled 2023-09-08 (×3): qty 1

## 2023-09-08 MED ORDER — LORAZEPAM 2 MG/ML IJ SOLN
0.0000 mg | INTRAMUSCULAR | Status: AC
  Administered 2023-09-08 (×3): 2 mg via INTRAVENOUS
  Administered 2023-09-08: 3 mg via INTRAVENOUS
  Administered 2023-09-09: 2 mg via INTRAVENOUS
  Administered 2023-09-09: 1 mg via INTRAVENOUS
  Administered 2023-09-09: 2 mg via INTRAVENOUS
  Administered 2023-09-10: 1 mg via INTRAVENOUS
  Filled 2023-09-08: qty 1
  Filled 2023-09-08: qty 2
  Filled 2023-09-08 (×8): qty 1

## 2023-09-08 MED ORDER — PHENOBARBITAL 32.4 MG PO TABS
32.4000 mg | ORAL_TABLET | Freq: Two times a day (BID) | ORAL | Status: AC
  Administered 2023-09-09 (×2): 32.4 mg via ORAL
  Filled 2023-09-08 (×2): qty 1

## 2023-09-08 MED ORDER — LORAZEPAM 2 MG/ML IJ SOLN
0.0000 mg | Freq: Three times a day (TID) | INTRAMUSCULAR | Status: DC
  Administered 2023-09-10: 1 mg via INTRAVENOUS
  Filled 2023-09-08: qty 1

## 2023-09-08 MED ORDER — MORPHINE SULFATE (PF) 4 MG/ML IV SOLN
4.0000 mg | INTRAVENOUS | Status: DC | PRN
  Administered 2023-09-08 (×2): 4 mg via INTRAVENOUS
  Filled 2023-09-08 (×2): qty 1

## 2023-09-08 MED ORDER — LORAZEPAM 2 MG/ML IJ SOLN
1.0000 mg | INTRAMUSCULAR | Status: DC | PRN
  Administered 2023-09-09 (×2): 2 mg via INTRAVENOUS

## 2023-09-08 MED ORDER — ACETAMINOPHEN 325 MG PO TABS: 650.0000 mg | ORAL_TABLET | Freq: Four times a day (QID) | ORAL | Status: DC | PRN

## 2023-09-08 MED ORDER — KCL IN DEXTROSE-NACL 20-5-0.9 MEQ/L-%-% IV SOLN
INTRAVENOUS | Status: AC
  Filled 2023-09-08: qty 1000

## 2023-09-08 MED ORDER — LORAZEPAM 1 MG PO TABS
1.0000 mg | ORAL_TABLET | ORAL | Status: DC | PRN
  Administered 2023-09-10 (×2): 1 mg via ORAL
  Filled 2023-09-08 (×2): qty 1

## 2023-09-08 MED ORDER — LORAZEPAM 2 MG/ML IJ SOLN
2.0000 mg | Freq: Once | INTRAMUSCULAR | Status: AC
  Administered 2023-09-08: 2 mg via INTRAVENOUS
  Filled 2023-09-08: qty 1

## 2023-09-08 MED ORDER — ADULT MULTIVITAMIN W/MINERALS CH
1.0000 | ORAL_TABLET | Freq: Every day | ORAL | Status: DC
  Administered 2023-09-08 – 2023-09-10 (×3): 1 via ORAL
  Filled 2023-09-08 (×3): qty 1

## 2023-09-08 MED ORDER — THIAMINE HCL 100 MG/ML IJ SOLN
100.0000 mg | Freq: Every day | INTRAMUSCULAR | Status: DC
  Administered 2023-09-08: 100 mg via INTRAVENOUS
  Filled 2023-09-08: qty 2

## 2023-09-08 MED ORDER — IOHEXOL 9 MG/ML PO SOLN
500.0000 mL | ORAL | Status: AC
  Administered 2023-09-08 (×2): 500 mL via ORAL

## 2023-09-08 NOTE — ED Notes (Signed)
Pt remains in triage room 9. Pt is calm after cell phone was returned. Pt remains in triage until a room is available. No rooms are available at this time per triage RN

## 2023-09-08 NOTE — ED Triage Notes (Signed)
Pt BIB [facility] from [facility]. Pt  Sts he is having visual and auditory hallucinations. +ETOH. Requesting alcohol rehab. Drinks 1/5 liquor, last drink today. Also c/o chest pain and headache.

## 2023-09-08 NOTE — ED Provider Notes (Signed)
Patient signed out to me from Sharilyn Sites PA pending metabolizing and reassessment  This is a 37 year old male with history of alcohol abuse, transaminitis who presents to emergency department by EMS complaining of hallucinations.  He was located at [facility] when he requested staff to call EMS for him. Upon arrival at ED, he complained of chest pain to triage staff.  Currently he is interested in alcohol detox. He denies SI/HI, self harm  He has been admitted several times this year for withdrawal but 08/21/23, 08/05/23, 07/14/23 most recently  ED workup significant for ethanol 430 and transaminitis similar to baseline likely due to chronic alcohol use. EKG NSR No leukocytosis nor elevated troponin  PE: Awake, A&Ox3 with mild tremors, seizure like activity, or focal deficits Complains of auditory and visual hallucinations.  CIWA 17.  2 mg Ativan and 4mg  zofran given in ED  He has no current complaints of chest pain, shortness of breath  Hospitalist consulted for admission Sanda Klein MD accepted patient for admission   Judithann Sheen, [place] 09/08/23 1208    Nira Conn, MD 09/09/23 [phone]

## 2023-09-08 NOTE — ED Notes (Signed)
ED TO INPATIENT HANDOFF REPORT  ED Nurse Name and Phone #: Suzanna Obey [phone]  S Name/Age/Gender Adam Peoples Heying Jr. 37 y.o. male Room/Bed: WA12/WA12  Code Status   Code Status: Full Code  Home/SNF/Other Home Patient oriented to: self, place, time, and situation Is this baseline? 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 Pt  Sts he is having visual and auditory hallucinations. +ETOH. Requesting alcohol rehab. Drinks 1/5 liquor, last drink today. Also c/o chest pain and headache.   Allergies Allergies  Allergen Reactions   Bee Venom Anaphylaxis    Level of Care/Admitting Diagnosis ED Disposition     ED Disposition  Admit   Condition  --   Comment  Hospital Area: [facility] [[id number]]  Level of Care: Stepdown [14]  Admit to SDU based on following criteria: Severe physiological/psychological symptoms:  Any diagnosis requiring assessment & intervention at least every 4 hours on an ongoing basis to obtain desired patient outcomes including stability and rehabilitation  May admit patient to [facility] or [facility] if equivalent level of care is available:: No  Covid Evaluation: Asymptomatic - no recent exposure (last 10 days) testing not required  Diagnosis: Alcohol withdrawal hallucinosis [facility]) [[id number]]  Admitting Physician: Bobette Mo [[id number]]  Attending Physician: Bobette Mo [[id number]]  Certification:: I certify this patient will need inpatient services for at least 2 midnights  Expected Medical Readiness: 09/11/2023          B Medical/Surgery History Past Medical History:  Diagnosis Date   Alcohol abuse    Eating disorder    history of bulemia   HYPERTENSION 06/25/2010   Seizure (HCC)    ETOH Related Seizure   No past surgical history on file.   A IV Location/Drains/Wounds Patient  Lines/Drains/Airways Status     Active Line/Drains/Airways     Name Placement date Placement time Site Days   Peripheral IV 09/08/23 20 G 1" Posterior;Right Forearm 09/08/23  1121  Forearm  less than 1            Intake/Output Last 24 hours No intake or output data in the 24 hours ending 09/08/23 1147  Labs/Imaging Results for orders placed or performed during the hospital encounter of 09/08/23 (from the past 48 hour(s))  Comprehensive metabolic panel     Status: Abnormal   Collection Time: 09/08/23  3:17 AM  Result Value Ref Range   Sodium 132 (L) 135 - 145 mmol/L   Potassium 3.6 3.5 - 5.1 mmol/L   Chloride 94 (L) 98 - 111 mmol/L   CO2 22 22 - 32 mmol/L   Glucose, Bld 108 (H) 70 - 99 mg/dL    Comment: Glucose reference range applies only to samples taken after fasting for at least 8 hours.   BUN 16 6 - 20 mg/dL   Creatinine, Ser 0.86 0.61 - 1.24 mg/dL   Calcium 8.9 8.9 - 57.8 mg/dL   Total Protein 7.6 6.5 - 8.1 g/dL   Albumin 4.7 3.5 - 5.0 g/dL   AST 78 (H) 15 - 41 U/L   ALT 72 (H) 0 - 44 U/L   Alkaline Phosphatase 52 38 - 126 U/L   Total Bilirubin 0.7 0.3 - 1.2 mg/dL   GFR, Estimated >46 >96 mL/min    Comment: (NOTE) Calculated using the CKD-EPI Creatinine Equation (2021)    Anion gap 16 (H) 5 - 15  Comment: Performed at [facility], [address] [place], [place] [address]  Ethanol     Status: Abnormal   Collection Time: 09/08/23  3:17 AM  Result Value Ref Range   Alcohol, Ethyl (B) 430 (HH) <10 mg/dL    Comment: CRITICAL RESULT CALLED TO, READ BACK BY AND VERIFIED WITH RIVERS,TJ RN @ [phone] 09/08/23 BY CHILDRESS,E (NOTE) Lowest detectable limit for serum alcohol is 10 mg/dL.  For medical purposes only. Performed at [facility], [address] [place], [place] [address]   cbc     Status: None   Collection Time: 09/08/23  3:17 AM  Result Value Ref Range   WBC 6.0 4.0 - 10.5 K/uL   RBC 4.84 4.22 - 5.81 MIL/uL    Hemoglobin 15.8 13.0 - 17.0 g/dL   HCT 19.1 47.8 - 29.5 %   MCV 96.7 80.0 - 100.0 fL   MCH 32.6 26.0 - 34.0 pg   MCHC 33.8 30.0 - 36.0 g/dL   RDW 62.1 30.8 - 65.7 %   Platelets 211 150 - 400 K/uL   nRBC 0.0 0.0 - 0.2 %    Comment: Performed at [facility], [address], [place], [place] [address]  Troponin I (High Sensitivity)     Status: None   Collection Time: 09/08/23  3:20 AM  Result Value Ref Range   Troponin I (High Sensitivity) <2 <18 ng/L    Comment: (NOTE) Elevated high sensitivity troponin I (hsTnI) values and significant  changes across serial measurements may suggest ACS but many other  chronic and acute conditions are known to elevate hsTnI results.  Refer to the "Links" section for chest pain algorithms and additional  guidance. Performed at [facility], [address] [place], [place] [address]    No results found.  Pending Labs Unresulted Labs (From admission, onward)     Start     Ordered   09/09/23 0500  CBC  Tomorrow morning,   R        09/08/23 1123   09/09/23 0500  Comprehensive metabolic panel  Tomorrow morning,   R        09/08/23 1123   09/08/23 1119  Magnesium  Add-on,   AD        09/08/23 1118   09/08/23 1119  Phosphorus  Add-on,   AD        09/08/23 1118   09/08/23 0303  Rapid urine drug screen (hospital performed)  Once,   STAT        09/08/23 0303            Vitals/Pain Today's Vitals   09/08/23 0301 09/08/23 0302 09/08/23 0707 09/08/23 0829  BP: (!) 137/91   106/70  Pulse: 98   77  Resp: 18   16  Temp: 98.7 F (37.1 C)   97.9 F (36.6 C)  TempSrc: Oral   Oral  SpO2: 95%   100%  Weight:  78.5 kg    Height:  5\' 7"  (1.702 m)    PainSc:  0-No pain 0-No pain     Isolation Precautions No active isolations  Medications Medications  LORazepam (ATIVAN) tablet 1-4 mg (has no administration in time range)    Or  LORazepam (ATIVAN) injection 1-4 mg (has no administration in time range)   thiamine (VITAMIN B1) tablet 100 mg ( Oral See Alternative 09/08/23 1139)    Or  thiamine (VITAMIN B1) injection 100 mg (100 mg Intravenous  Given 09/08/23 1139)  folic acid (FOLVITE) tablet 1 mg (has no administration in time range)  multivitamin with minerals tablet 1 tablet (1 tablet Oral Given 09/08/23 1140)  LORazepam (ATIVAN) injection 0-4 mg (has no administration in time range)    Followed by  LORazepam (ATIVAN) injection 0-4 mg (has no administration in time range)  magnesium sulfate IVPB 2 g 50 mL (2 g Intravenous New Bag/Given 09/08/23 1140)  acetaminophen (TYLENOL) tablet 650 mg (has no administration in time range)    Or  acetaminophen (TYLENOL) suppository 650 mg (has no administration in time range)  ondansetron (ZOFRAN) tablet 4 mg (has no administration in time range)    Or  ondansetron (ZOFRAN) injection 4 mg (has no administration in time range)  LORazepam (ATIVAN) injection 2 mg (2 mg Intravenous Given 09/08/23 1129)  ondansetron (ZOFRAN) injection 4 mg (4 mg Intravenous Given 09/08/23 1125)    Mobility walks     Focused Assessments N/A   R Recommendations: See Admitting Provider Note  Report given to:   Additional Notes: N/A

## 2023-09-08 NOTE — ED Notes (Signed)
Pt remains sleeping in triage room 9. No other behavioral disturbances / agitation.

## 2023-09-08 NOTE — ED Provider Notes (Signed)
[place] EMERGENCY DEPARTMENT AT [facility] Provider Note   CSN: [id number] Arrival date & time: 09/08/23  0257     History  Chief Complaint  Patient presents with   Psychiatric Evaluation    Adam [name] Montez Hageman. is a 37 y.o. male.  The history is provided by the patient and medical records.   37 year old male with history of alcohol abuse, transaminitis, presenting to the ED reporting hallucinations.  He was apparently at [facility] and asked staff to call EMS for him.  He does admit to EtOH.  Reported chest pain and headache to triage, does not make any mention of this to me on exam.  Denies SI/HI but wants detox.  Per chart review patient was admitted 07/14/23, 08/05/23, and 08/21/23 for alcohol withdrawal.    Home Medications Prior to Admission medications   Medication Sig Start Date End Date Taking? Authorizing Provider  chlordiazePOXIDE (LIBRIUM) 25 MG capsule 50mg  PO TID x 1D, then 25-50mg  PO BID X 1D, then 25-50mg  PO QD X 1D 06/22/23   Carroll Sage, PA-C  Cyanocobalamin (VITAMIN B12 PO) Take 1 tablet by mouth daily.    [provider]  folic acid (FOLVITE) 1 MG tablet Take 1 tablet (1 mg total) by mouth daily. 07/07/23   Suzan Slick, MD  lisinopril (ZESTRIL) 30 MG tablet Take 1 tablet (30 mg total) by mouth daily. 07/07/23   Suzan Slick, MD  thiamine (VITAMIN B1) 100 MG tablet Take 1 tablet (100 mg total) by mouth daily. 07/07/23   Suzan Slick, MD      Allergies    Bee venom    Review of Systems   Review of Systems  Psychiatric/Behavioral:         EtOH  All other systems reviewed and are negative.   Physical Exam Updated Vital Signs BP (!) 137/91 (BP Location: Left Arm)   Pulse 98   Temp 98.7 F (37.1 C) (Oral)   Resp 18   Ht 5\' 7"  (1.702 m)   Wt 78.5 kg   SpO2 95%   BMI 27.10 kg/m   Physical Exam Vitals and nursing note reviewed.  Constitutional:      Appearance: He is well-developed.      Comments: Appears intoxicated  HENT:     Head: Normocephalic and atraumatic.     Mouth/Throat:     Comments: Breath smells of EtOH Eyes:     Conjunctiva/sclera: Conjunctivae normal.     Pupils: Pupils are equal, round, and reactive to light.  Cardiovascular:     Rate and Rhythm: Normal rate and regular rhythm.     Heart sounds: Normal heart sounds.  Pulmonary:     Effort: Pulmonary effort is normal.     Breath sounds: Normal breath sounds.  Abdominal:     General: Bowel sounds are normal.     Palpations: Abdomen is soft.  Musculoskeletal:        General: Normal range of motion.     Cervical back: Normal range of motion.  Skin:    General: Skin is warm and dry.  Neurological:     Mental Status: He is alert.     Comments: Awake, alert, seems intoxicated, no seizure activity observed, no focal deficits     ED Results / Procedures / Treatments   Labs (all labs ordered are listed, but only abnormal results are displayed) Labs Reviewed  COMPREHENSIVE METABOLIC PANEL - Abnormal; Notable for the following components:  Result Value   Sodium 132 (*)    Chloride 94 (*)    Glucose, Bld 108 (*)    AST 78 (*)    ALT 72 (*)    Anion gap 16 (*)    All other components within normal limits  ETHANOL - Abnormal; Notable for the following components:   Alcohol, Ethyl (B) 430 (*)    All other components within normal limits  CBC  RAPID URINE DRUG SCREEN, HOSP PERFORMED  TROPONIN I (HIGH SENSITIVITY)    EKG EKG Interpretation Date/Time:  Thursday September 08 2023 03:23:45 EDT Ventricular Rate:  94 PR Interval:  153 QRS Duration:  95 QT Interval:  347 QTC Calculation: 434 R Axis:   70  Text Interpretation: Sinus rhythm Borderline T abnormalities, inferior leads Borderline ST elevation, anterolateral leads No significant change since last tracing Confirmed by Drema Pry [phone]) on 09/08/2023 4:03:07 AM  Radiology No results found.  Procedures Procedures     Medications Ordered in ED Medications - No data to display  ED Course/ Medical Decision Making/ A&P                                 Medical Decision Making Amount and/or Complexity of Data Reviewed Labs: ordered. ECG/medicine tests: ordered and independent interpretation performed.    37 year old male here requesting alcohol detox.  Multiple recent admission to various facilities for detox/withdrawal.  He appears quite intoxicated currently, breath smells heavily of alcohol.  Denies SI/HI when questioned.  Labs as above--ethanol 430.  He does have transaminitis which appears chronic compared with prior values, suspect likely due to alcohol abuse.    No signs/symptoms of withdrawal at this time.  Will need to metabolize and reassess.  Care signed out to oncoming provider at shift change.  Final Clinical Impression(s) / ED Diagnoses Final diagnoses:  Alcoholic intoxication without complication [facility])    Rx / DC Orders ED Discharge Orders     None         Garlon Hatchet, PA-C 09/08/23 0636    Nira Conn, MD 09/09/23 [phone]

## 2023-09-08 NOTE — ED Notes (Signed)
Pt increasingly agitated that he can't speak to his wife. Cell phone given back to pt

## 2023-09-08 NOTE — H&P (Signed)
History and Physical    Patient: Adam [name]. ZOX:[id number] DOB: [birth year] DOA: 09/08/2023 DOS: the patient was seen and examined on 09/08/2023 PCP: Suzan Slick, MD  Patient coming from: Home  Chief Complaint:  Chief Complaint  Patient presents with   Psychiatric Evaluation   HPI: Adam [name]. is a 37 y.o. male with medical history significant of bulimia, hypertension, alcohol abuse, alcohol withdrawal hallucinations, alcohol withdrawal seizures who is known to me from a prior EtOH withdrawal admission who presented to the emergency department requesting alcohol detoxification.  He was started through the hotel and requested the staff there to call EMS for him.  He is complaining of hearing things and seeing spots in the wall that he knows are not there.  He has been drinking about half a gallon of liquor/day recently.  He wants to cease alcohol consumption so he can be with his family.  He also stated he has been having left-sided abdominal pain, multiple episodes of diarrhea and emesis earlier this morning.  He feels dehydrated.Marland Kitchen He denied fever, chills, rhinorrhea, sore throat, wheezing or hemoptysis.  No chest pain, palpitations, diaphoresis, PND, orthopnea or pitting edema of the lower extremities.  No constipation, melena or hematochezia.  No flank pain, dysuria, frequency or hematuria.  No polyuria, polydipsia, polyphagia or blurred vision.   Lab work: CBC was normal.  Alcohol level 430 mg/dL.  CMP showed a sodium 132, potassium 3.6, chloride 94 and CO2 22 mmol/L with an anion gap of 16.  Glucose 108 mg/deciliter, AST 78 and ALT 72 units/L.  The rest of the CMP measurements were normal.  ED course: Initial vital signs were temperature 98.7 F, pulse 107, respiration 18, BP 137/91 mmHg O2 sat 92% on room air.  The patient received lorazepam 2 mg IVP and ondansetron 4 mg IVP.   Review of Systems: As mentioned in the history of present illness. All other  systems reviewed and are negative. Past Medical History:  Diagnosis Date   Alcohol abuse    Eating disorder    history of bulemia   HYPERTENSION 06/25/2010   Seizure (HCC)    ETOH Related Seizure   No past surgical history on file. Social History:  reports that he has never smoked. He has never been exposed to tobacco smoke. He has never used smokeless tobacco. He reports current alcohol use of about 70.0 standard drinks of alcohol per week. He reports current drug use.  Allergies  Allergen Reactions   Bee Venom Anaphylaxis    Family History  Problem Relation Age of Onset   Cancer Mother        lung   Hypertension Father     Prior to Admission medications   Medication Sig Start Date End Date Taking? Authorizing Provider  chlordiazePOXIDE (LIBRIUM) 25 MG capsule 50mg  PO TID x 1D, then 25-50mg  PO BID X 1D, then 25-50mg  PO QD X 1D 06/22/23   Carroll Sage, PA-C  Cyanocobalamin (VITAMIN B12 PO) Take 1 tablet by mouth daily.    [provider]  folic acid (FOLVITE) 1 MG tablet Take 1 tablet (1 mg total) by mouth daily. 07/07/23   Suzan Slick, MD  lisinopril (ZESTRIL) 30 MG tablet Take 1 tablet (30 mg total) by mouth daily. 07/07/23   Suzan Slick, MD  thiamine (VITAMIN B1) 100 MG tablet Take 1 tablet (100 mg total) by mouth daily. 07/07/23   Suzan Slick, MD    Physical Exam: Vitals:  09/08/23 0300 09/08/23 0301 09/08/23 0302 09/08/23 0829  BP: (!) 137/91 (!) 137/91  106/70  Pulse: (!) 107 98  77  Resp:  18  16  Temp:  98.7 F (37.1 C)  97.9 F (36.6 C)  TempSrc:  Oral  Oral  SpO2: 92% 95%  100%  Weight:   78.5 kg   Height:   5\' 7"  (1.702 m)    Physical Exam Vitals and nursing note reviewed.  Constitutional:      General: He is awake.     Appearance: Normal appearance. He is ill-appearing.  HENT:     Head: Normocephalic.     Nose: No rhinorrhea.     Mouth/Throat:     Mouth: Mucous membranes are dry.  Eyes:     General: No scleral  icterus.    Pupils: Pupils are equal, round, and reactive to light.  Neck:     Vascular: No JVD.  Cardiovascular:     Rate and Rhythm: Normal rate and regular rhythm.     Heart sounds: S1 normal and S2 normal.  Pulmonary:     Effort: Pulmonary effort is normal.     Breath sounds: Normal breath sounds.  Abdominal:     General: Bowel sounds are normal. There is no distension.     Palpations: Abdomen is soft.     Tenderness: There is abdominal tenderness. There is left CVA tenderness. There is no guarding or rebound.  Musculoskeletal:     Cervical back: Neck supple.     Right lower leg: No edema.     Left lower leg: No edema.  Skin:    General: Skin is warm and dry.  Neurological:     General: No focal deficit present.     Mental Status: He is alert and oriented to person, place, and time.     Cranial Nerves: No cranial nerve deficit.     Sensory: No sensory deficit.     Motor: Tremor present.  Psychiatric:        Mood and Affect: Mood is anxious.        Behavior: Behavior normal. Behavior is cooperative.    Data Reviewed:  Results are pending, will review when available.  EKG: Vent. rate 94 BPM PR interval 153 ms QRS duration 95 ms QT/QTcB 347/434 ms P-R-T axes 50 70 -6 Sinus rhythm Borderline T abnormalities, inferior leads Borderline ST elevation, anterolateral leads  Assessment and Plan: Principal Problem:   Alcohol intoxication (HCC) Now developing:   Alcohol withdrawal hallucinosis (HCC) Stepdown/inpatient. CIWA protocol with lorazepam. Given history of EtOH withdrawal seizures -Will also add phenobarbital taper. Magnesium sulfate supplementation. Folate, MVI and thiamine. Consult TOC team.  Active Problems:   Primary hypertension BP initially soft. Continue lisinopril 10 mg p.o. daily.    Transaminitis Secondary to alcohol use. Alcohol cessation to decrease risk of cirrhosis.    Anxiety Currently on CIWA protocol.    Hyponatremia In the  setting of alcohol consumption. Follow-up sodium level in the morning.    Advance Care Planning:   Code Status: Full Code   Consults:   Family Communication:   Severity of Illness: The appropriate patient status for this patient is INPATIENT. Inpatient status is judged to be reasonable and necessary in order to provide the required intensity of service to ensure the patient's safety. The patient's presenting symptoms, physical exam findings, and initial radiographic and laboratory data in the context of their chronic comorbidities is felt to place them at high risk for  further clinical deterioration. Furthermore, it is not anticipated that the patient will be medically stable for discharge from the hospital within 2 midnights of admission.   * I certify that at the point of admission it is my clinical judgment that the patient will require inpatient hospital care spanning beyond 2 midnights from the point of admission due to high intensity of service, high risk for further deterioration and high frequency of surveillance required.*  Author: Bobette Mo, MD 09/08/2023 11:00 AM  For on call review [email or web address].   This document was prepared using Dragon voice recognition software and may contain some unintended transcription errors.

## 2023-09-08 NOTE — ED Notes (Signed)
Patient is awake and asking for the doctor. PA messaged.

## 2023-09-08 NOTE — ED Provider Notes (Signed)
10:45 AM Pt notably tremulous on exam.  Hx of severe alcohol withdrawal.  Having hallucinations.  At risk for sever complications.   IV ativan ordered, plan on admission   Linwood Dibbles, MD 09/08/23 1046

## 2023-09-08 NOTE — ED Notes (Signed)
Pt's pocket knife with security. Has been dressed out in purple scrubs. Wanded by security.  Belongings placed in triage cabinet by NT [facility].

## 2023-09-09 DIAGNOSIS — F10920 Alcohol use, unspecified with intoxication, uncomplicated: Secondary | ICD-10-CM | POA: Diagnosis not present

## 2023-09-09 DIAGNOSIS — F10932 Alcohol use, unspecified with withdrawal with perceptual disturbance: Secondary | ICD-10-CM

## 2023-09-09 LAB — COMPREHENSIVE METABOLIC PANEL
ALT: 54 U/L — ABNORMAL HIGH (ref 0–44)
AST: 55 U/L — ABNORMAL HIGH (ref 15–41)
Albumin: 4 g/dL (ref 3.5–5.0)
Alkaline Phosphatase: 46 U/L (ref 38–126)
Anion gap: 11 (ref 5–15)
BUN: 16 mg/dL (ref 6–20)
CO2: 26 mmol/L (ref 22–32)
Calcium: 9.2 mg/dL (ref 8.9–10.3)
Chloride: 96 mmol/L — ABNORMAL LOW (ref 98–111)
Creatinine, Ser: 0.95 mg/dL (ref 0.61–1.24)
GFR, Estimated: >60 mL/min (ref >60)
Glucose, Bld: 123 mg/dL — ABNORMAL HIGH (ref 70–99)
Potassium: 4 mmol/L (ref 3.5–5.1)
Sodium: 133 mmol/L — ABNORMAL LOW (ref 135–145)
Total Bilirubin: 1.7 mg/dL — ABNORMAL HIGH (ref 0.3–1.2)
Total Protein: 6.6 g/dL (ref 6.5–8.1)

## 2023-09-09 LAB — CBC
HCT: 40.4 % (ref 39.0–52.0)
Hemoglobin: 13.5 g/dL (ref 13.0–17.0)
MCH: 33.5 pg (ref 26.0–34.0)
MCHC: 33.4 g/dL (ref 30.0–36.0)
MCV: 100.2 fL — ABNORMAL HIGH (ref 80.0–100.0)
Platelets: 108 10*3/uL — ABNORMAL LOW (ref 150–400)
RBC: 4.03 MIL/uL — ABNORMAL LOW (ref 4.22–5.81)
RDW: 11.4 % — ABNORMAL LOW (ref 11.5–15.5)
WBC: 4 10*3/uL (ref 4.0–10.5)
nRBC: 0 % (ref 0.0–0.2)

## 2023-09-09 NOTE — Progress Notes (Signed)
  Progress Note   Patient: Adam [name]. WUJ:[id number] DOB: [birth year] DOA: 09/08/2023     1 DOS: the patient was seen and examined on 09/09/2023   Brief hospital course: 37 y.o. male with medical history significant of bulimia, hypertension, alcohol abuse, alcohol withdrawal hallucinations, alcohol withdrawal seizures who is known to me from a prior EtOH withdrawal admission who presented to the emergency department requesting alcohol detoxification.  He was started through the hotel and requested the staff there to call EMS for him.  He is complaining of hearing things and seeing spots in the wall that he knows are not there.  He has been drinking about half a gallon of liquor/day recently.  He wants to cease alcohol consumption so he can be with his family.  He also stated he has been having left-sided abdominal pain, multiple episodes of diarrhea and emesis earlier this morning.  He feels dehydrated.Marland Kitchen He denied fever, chills, rhinorrhea, sore throat, wheezing or hemoptysis.  No chest pain, palpitations, diaphoresis, PND, orthopnea or pitting edema of the lower extremities.  No constipation, melena or hematochezia.  No flank pain, dysuria, frequency or hematuria.  No polyuria, polydipsia, polyphagia or blurred vision.   Assessment and Plan: Principal Problem:   Alcohol intoxication (HCC) Now developing:   Alcohol withdrawal hallucinosis (HCC) -Presenting CIWA score as high as 18 at presentation -Pt has been continued on CIWA protocol with phenobarb taper -CIWA score is now trending down. Pt is conversant, alert and oriented -Cont folate and multivitamin -recheck cmp in AM -Will need to abstain from future ETOH. Cessation done. Pt is open to ETOH programs. Will consult TOC   Active Problems:   Primary hypertension Continue lisinopril 10 mg p.o. daily. -bp stable at this time     Transaminitis Secondary to alcohol use. LFT's are trending down     Anxiety Currently on CIWA  protocol.     Hyponatremia In the setting of alcohol consumption. Slowly trending up   Subjective: Asking about going home.   Physical Exam: Vitals:   09/09/23 1244 09/09/23 1300 09/09/23 1400 09/09/23 1500  BP:  118/69 (!) 140/93 (!) 149/83  Pulse:  60 62 (!) 102  Resp:  11 12 13   Temp: 99.1 F (37.3 C)     TempSrc: Oral     SpO2:  94% 94% 97%  Weight:      Height:       General exam: Awake, laying in bed, in nad Respiratory system: Normal respiratory effort, no wheezing Cardiovascular system: regular rate, s1, s2 Gastrointestinal system: Soft, nondistended, positive BS Central nervous system: CN2-12 grossly intact, strength intact Extremities: Perfused, no clubbing Skin: Normal skin turgor, no notable skin lesions seen Psychiatry: Mood normal // no visual hallucinations   Data Reviewed:  Labs reviewed: Na 133, Cr 0.95, WBC 4.0, Hgb 13.5, Plts 108  Family Communication: Pt in room, family not at bedside  Disposition: Status is: Inpatient Remains inpatient appropriate because: severity of illness  Planned Discharge Destination: Home    Author: Rickey Barbara, MD 09/09/2023 3:46 PM  For on call review [email or web address].

## 2023-09-09 NOTE — Hospital Course (Signed)
37 y.o. male with medical history significant of bulimia, hypertension, alcohol abuse, alcohol withdrawal hallucinations, alcohol withdrawal seizures who is known to me from a prior EtOH withdrawal admission who presented to the emergency department requesting alcohol detoxification.  He was started through the hotel and requested the staff there to call EMS for him.  He is complaining of hearing things and seeing spots in the wall that he knows are not there.  He has been drinking about half a gallon of liquor/day recently.  He wants to cease alcohol consumption so he can be with his family.  He also stated he has been having left-sided abdominal pain, multiple episodes of diarrhea and emesis earlier this morning.  He feels dehydrated.Marland Kitchen He denied fever, chills, rhinorrhea, sore throat, wheezing or hemoptysis.  No chest pain, palpitations, diaphoresis, PND, orthopnea or pitting edema of the lower extremities.  No constipation, melena or hematochezia.  No flank pain, dysuria, frequency or hematuria.  No polyuria, polydipsia, polyphagia or blurred vision.

## 2023-09-09 NOTE — Plan of Care (Signed)
[facility] Tonsillectomy/Adenoidectomy, Postoperative PEDS care plan entered in error.

## 2023-09-09 NOTE — Plan of Care (Signed)
Plan of care, CIWA and goals reviewed with patient, time given for questions and answers, patient handbook.guide at bedside.  Problem: Education: Goal: Knowledge of General Education information will improve Description: Including pain rating scale, medication(s)/side effects and non-pharmacologic comfort measures Outcome: Progressing   Problem: Health Behavior/Discharge Planning: Goal: Ability to manage health-related needs will improve Outcome: Progressing   Problem: Clinical Measurements: Goal: Ability to maintain clinical measurements within normal limits will improve Outcome: Progressing Goal: Will remain free from infection Outcome: Progressing Goal: Diagnostic test results will improve Outcome: Progressing Goal: Respiratory complications will improve Outcome: Progressing Goal: Cardiovascular complication will be avoided Outcome: Progressing   Problem: Activity: Goal: Risk for activity intolerance will decrease Outcome: Progressing   Problem: Nutrition: Goal: Adequate nutrition will be maintained Outcome: Progressing   Problem: Coping: Goal: Level of anxiety will decrease Outcome: Progressing   Problem: Elimination: Goal: Will not experience complications related to bowel motility Outcome: Progressing Goal: Will not experience complications related to urinary retention Outcome: Progressing   Problem: Pain Managment: Goal: General experience of comfort will improve Outcome: Progressing   Problem: Safety: Goal: Ability to remain free from injury will improve Outcome: Progressing   Problem: Skin Integrity: Goal: Risk for impaired skin integrity will decrease Outcome: Progressing

## 2023-09-10 ENCOUNTER — Encounter (HOSPITAL_COMMUNITY): Payer: Self-pay

## 2023-09-10 DIAGNOSIS — F10920 Alcohol use, unspecified with intoxication, uncomplicated: Secondary | ICD-10-CM | POA: Diagnosis not present

## 2023-09-10 DIAGNOSIS — F10932 Alcohol use, unspecified with withdrawal with perceptual disturbance: Secondary | ICD-10-CM

## 2023-09-10 LAB — CBC
HCT: 47.7 % (ref 39.0–52.0)
Hemoglobin: 15.6 g/dL (ref 13.0–17.0)
MCH: 32.4 pg (ref 26.0–34.0)
MCHC: 32.7 g/dL (ref 30.0–36.0)
MCV: 99.2 fL (ref 80.0–100.0)
Platelets: 126 10*3/uL — ABNORMAL LOW (ref 150–400)
RBC: 4.81 MIL/uL (ref 4.22–5.81)
RDW: 11 % — ABNORMAL LOW (ref 11.5–15.5)
WBC: 3.9 10*3/uL — ABNORMAL LOW (ref 4.0–10.5)
nRBC: 0 % (ref 0.0–0.2)

## 2023-09-10 LAB — COMPREHENSIVE METABOLIC PANEL
ALT: 60 U/L — ABNORMAL HIGH (ref 0–44)
AST: 60 U/L — ABNORMAL HIGH (ref 15–41)
Albumin: 4.5 g/dL (ref 3.5–5.0)
Alkaline Phosphatase: 56 U/L (ref 38–126)
Anion gap: 13 (ref 5–15)
BUN: 16 mg/dL (ref 6–20)
CO2: 24 mmol/L (ref 22–32)
Calcium: 10 mg/dL (ref 8.9–10.3)
Chloride: 97 mmol/L — ABNORMAL LOW (ref 98–111)
Creatinine, Ser: 0.9 mg/dL (ref 0.61–1.24)
GFR, Estimated: >60 mL/min (ref >60)
Glucose, Bld: 106 mg/dL — ABNORMAL HIGH (ref 70–99)
Potassium: 4.2 mmol/L (ref 3.5–5.1)
Sodium: 134 mmol/L — ABNORMAL LOW (ref 135–145)
Total Bilirubin: 1.4 mg/dL — ABNORMAL HIGH (ref 0.3–1.2)
Total Protein: 7.7 g/dL (ref 6.5–8.1)

## 2023-09-10 NOTE — Progress Notes (Signed)
Pt eloped sometime between 2020 and 2045 on 09/10/2023. Pt left with IV in place. Pt was A&O x (person, place, situation ). Notified the Charge RN, security, [facility] and the NP [facility] floor coverage. Filed the AMA form.

## 2023-09-10 NOTE — Progress Notes (Addendum)
     Patient Name: Adam [name] [facility].           DOB: 11-25-1985  MRN: [id number]      Admission Date: 09/08/2023  Attending Provider: Jerald Kief, MD  Primary Diagnosis: Alcohol withdrawal hallucinosis (HCC)   Level of care: Med-Surg   OVERNIGHT PROGRESS REPORT   Notified by bedside RN that pt has eloped.  RN last saw patient around 2020. He was A/O x4 per RN assessment. Security was called, and they have not been unable to locate patient.     Anthoney Harada, DNP, ACNPC- AG [facility]

## 2023-09-10 NOTE — Progress Notes (Signed)
  Progress Note   Patient: Adam [name]. ZOX:[id number] DOB: 02-25-1986 DOA: 09/08/2023     2 DOS: the patient was seen and examined on 09/10/2023   Brief hospital course: 37 y.o. male with medical history significant of bulimia, hypertension, alcohol abuse, alcohol withdrawal hallucinations, alcohol withdrawal seizures who is known to me from a prior EtOH withdrawal admission who presented to the emergency department requesting alcohol detoxification.  He was started through the hotel and requested the staff there to call EMS for him.  He is complaining of hearing things and seeing spots in the wall that he knows are not there.  He has been drinking about half a gallon of liquor/day recently.  He wants to cease alcohol consumption so he can be with his family.  He also stated he has been having left-sided abdominal pain, multiple episodes of diarrhea and emesis earlier this morning.  He feels dehydrated.Marland Kitchen He denied fever, chills, rhinorrhea, sore throat, wheezing or hemoptysis.  No chest pain, palpitations, diaphoresis, PND, orthopnea or pitting edema of the lower extremities.  No constipation, melena or hematochezia.  No flank pain, dysuria, frequency or hematuria.  No polyuria, polydipsia, polyphagia or blurred vision.   Assessment and Plan: Principal Problem:   Alcohol intoxication (HCC) Now developing:   Alcohol withdrawal hallucinosis (HCC) -Presenting CIWA score as high as 18 at presentation -Pt has been continued on CIWA protocol with phenobarb taper -CIWA score is now trending down, although it still remains elevated this AM to 10. Pt still visibly trembling -Cont folate and multivitamin -recheck cmp in AM -Will need to abstain from future ETOH. Cessation done. Pt is open to ETOH programs. TOC was consulted   Active Problems:   Primary hypertension Continue lisinopril 10 mg p.o. daily. -bp stable at this time     Transaminitis Secondary to alcohol use. LFT's had trended  down from presentation, stable at present     Anxiety Currently on CIWA protocol.     Hyponatremia In the setting of alcohol consumption. Slowly trending up   Subjective: Reports feeling better overall. Did vomit last night  Physical Exam: Vitals:   09/10/23 1045 09/10/23 1100 09/10/23 1151 09/10/23 1208  BP: 119/60 118/63    Pulse: (!) 102 84 77   Resp: 17 15 13    Temp:    98.1 F (36.7 C)  TempSrc:    Oral  SpO2: 94% 98% 92%   Weight:      Height:       General exam: Conversant, in no acute distress Respiratory system: normal chest rise, clear, no audible wheezing Cardiovascular system: regular rhythm, s1-s2 Gastrointestinal system: Nondistended, nontender, pos BS Central nervous system: No seizures, no tremors Extremities: No cyanosis, no joint deformities Skin: No rashes, no pallor Psychiatry: Affect normal // no auditory hallucinations   Data Reviewed:  Labs reviewed: Na 134, K 4.2, Cr 0.90, WBC 3.9, Hgb 15.6, Plts 126  Family Communication: Pt in room, family not at bedside  Disposition: Status is: Inpatient Remains inpatient appropriate because: severity of illness  Planned Discharge Destination: Home    Author: Rickey Barbara, MD 09/10/2023 3:29 PM  For on call review [email or web address].

## 2023-09-12 NOTE — Discharge Summary (Signed)
Physician Discharge Summary   Patient: Adam Goetschius Duhamel Jr. MRN: [id number] DOB: July 14, 1986  Admit date:     09/08/2023  Discharge date: 09/10/2023  Discharge Physician: Rickey Barbara   PCP: Suzan Slick, MD   PATIENT LEFT AGAINST MEDICAL ADVICE  Discharge Diagnoses: Principal Problem:   Alcohol withdrawal hallucinosis (HCC) Active Problems:   Primary hypertension   Transaminitis   Alcohol intoxication (HCC)   Anxiety   Hyponatremia  Resolved Problems:   * No resolved hospital problems. *  Hospital Course: 37 y.o. male with medical history significant of bulimia, hypertension, alcohol abuse, alcohol withdrawal hallucinations, alcohol withdrawal seizures who is known to me from a prior EtOH withdrawal admission who presented to the emergency department requesting alcohol detoxification.  He was started through the hotel and requested the staff there to call EMS for him.  He is complaining of hearing things and seeing spots in the wall that he knows are not there.  He has been drinking about half a gallon of liquor/day recently.  He wants to cease alcohol consumption so he can be with his family.  He also stated he has been having left-sided abdominal pain, multiple episodes of diarrhea and emesis earlier this morning.  He feels dehydrated.Marland Kitchen He denied fever, chills, rhinorrhea, sore throat, wheezing or hemoptysis.  No chest pain, palpitations, diaphoresis, PND, orthopnea or pitting edema of the lower extremities.  No constipation, melena or hematochezia.  No flank pain, dysuria, frequency or hematuria.  No polyuria, polydipsia, polyphagia or blurred vision.   Assessment and Plan: Principal Problem:   Alcohol intoxication (HCC) Now developing:   Alcohol withdrawal hallucinosis (HCC) -Presenting CIWA score as high as 18 at presentation -Pt has been continued on CIWA protocol with phenobarb taper -CIWA score remained around the 10 range -Continued folate and multivitamin while  in hospital -On the evening of 10/19, patient reportedly eloped from hospital. Pt was noted to have been alert and oriented at the time   Active Problems:   Primary hypertension Continued lisinopril 10 mg p.o. daily while in hospital -bp had remained stable     Transaminitis Secondary to alcohol use. LFT's had trended down from presentation     Anxiety Was continued on CIWA protocol     Hyponatremia In the setting of alcohol consumption. Slowly trended up      DISCHARGE MEDICATION: Allergies as of 09/10/2023       Reactions   Bee Venom Anaphylaxis        Medication List     ASK your doctor about these medications    chlordiazePOXIDE 25 MG capsule Commonly known as: LIBRIUM 50mg  PO TID x 1D, then 25-50mg  PO BID X 1D, then 25-50mg  PO QD X 1D   folic acid 1 MG tablet Commonly known as: FOLVITE Take 1 tablet (1 mg total) by mouth daily.   lisinopril 30 MG tablet Commonly known as: ZESTRIL Take 1 tablet (30 mg total) by mouth daily.   thiamine 100 MG tablet Commonly known as: VITAMIN B1 Take 1 tablet (100 mg total) by mouth daily.   VITAMIN B12 PO Take 1 tablet by mouth daily.        Follow-up Information     Schedule an appointment as soon as possible for a visit  with Suzan Slick, MD.   Specialty: [facility] Medicine Contact information: [address] [place] [address] [phone]                Discharge Exam: Ceasar Mons Weights  09/08/23 0302 09/08/23 1228  Weight: 78.5 kg 76.7 kg    Condition at discharge: Left Against Medical Advice  The results of significant diagnostics from this hospitalization (including imaging, microbiology, ancillary and laboratory) are listed below for reference.   Imaging Studies: CT ABDOMEN PELVIS W CONTRAST  Result Date: 09/08/2023 CLINICAL DATA:  Acute non localized abdominal pain. EXAM: CT ABDOMEN AND PELVIS WITH CONTRAST TECHNIQUE: Multidetector CT imaging of the abdomen and pelvis was  performed using the standard protocol following bolus administration of intravenous contrast. RADIATION DOSE REDUCTION: This exam was performed according to the departmental dose-optimization program which includes automated exposure control, adjustment of the mA and/or kV according to patient size and/or use of iterative reconstruction technique. CONTRAST:  OMNIPAQUE IOHEXOL 300 MG/ML  SOLN COMPARISON:  None Available. FINDINGS: Lower Chest: No acute findings. Hepatobiliary: No suspicious hepatic masses identified. Moderate to severe diffuse hepatic steatosis is seen. Gallbladder is unremarkable. No evidence of biliary ductal dilatation. Pancreas:  No mass or inflammatory changes. Spleen: Within normal limits in size and appearance. Adrenals/Urinary Tract: No suspicious masses identified. No evidence of ureteral calculi or hydronephrosis. Stomach/Bowel: No evidence of obstruction, inflammatory process or abnormal fluid collections. Normal appendix visualized. Vascular/Lymphatic: No pathologically enlarged lymph nodes. No acute vascular findings. Reproductive:  No mass or other significant abnormality. Other:  None. Musculoskeletal:  No suspicious bone lesions identified. IMPRESSION: No acute findings. Moderate to severe hepatic steatosis. Electronically Signed   By: Danae Orleans M.D.   On: 09/08/2023 17:05    Microbiology: Results for orders placed or performed during the hospital encounter of 09/08/23  MRSA Next Gen by PCR, Nasal     Status: None   Collection Time: 09/08/23 12:31 PM   Specimen: Nasal Mucosa; Nasal Swab  Result Value Ref Range Status   MRSA by PCR Next Gen NOT DETECTED NOT DETECTED Final    Comment: (NOTE) The GeneXpert MRSA Assay (FDA approved for NASAL specimens only), is one component of a comprehensive MRSA colonization surveillance program. It is not intended to diagnose MRSA infection nor to guide or monitor treatment for MRSA infections. Test performance is not FDA  approved in patients less than 50 years old. Performed at [facility], [address], [place], [place] [address]     Labs: CBC: Recent Labs  Lab 09/08/23 0317 09/09/23 0934 09/10/23 0300  WBC 6.0 4.0 3.9*  HGB 15.8 13.5 15.6  HCT 46.8 40.4 47.7  MCV 96.7 100.2* 99.2  PLT 211 108* 126*   Basic Metabolic Panel: Recent Labs  Lab 09/08/23 0317 09/09/23 0934 09/10/23 0300  NA 132* 133* 134*  K 3.6 4.0 4.2  CL 94* 96* 97*  CO2 22 26 24   GLUCOSE 108* 123* 106*  BUN 16 16 16   CREATININE 0.97 0.95 0.90  CALCIUM 8.9 9.2 10.0  MG 2.1  --   --   PHOS 3.2  --   --    Liver Function Tests: Recent Labs  Lab 09/08/23 0317 09/09/23 0934 09/10/23 0300  AST 78* 55* 60*  ALT 72* 54* 60*  ALKPHOS 52 46 56  BILITOT 0.7 1.7* 1.4*  PROT 7.6 6.6 7.7  ALBUMIN 4.7 4.0 4.5   CBG: No results for input(s): "GLUCAP" in the last 168 hours.   Signed: Rickey Barbara, MD [facility] 09/12/2023

## 2023-09-20 ENCOUNTER — Encounter (HOSPITAL_COMMUNITY): Payer: Self-pay

## 2023-09-20 ENCOUNTER — Other Ambulatory Visit: Payer: Self-pay

## 2023-09-20 ENCOUNTER — Emergency Department (HOSPITAL_COMMUNITY)
Admission: EM | Admit: 2023-09-20 | Discharge: 2023-09-20 | Disposition: A | Discharge status: Home / Self Care | Payer: MEDICAID | Attending: Emergency Medicine | Admitting: Emergency Medicine

## 2023-09-20 DIAGNOSIS — R1013 Epigastric pain: Secondary | ICD-10-CM | POA: Insufficient documentation

## 2023-09-20 DIAGNOSIS — Z79899 Other long term (current) drug therapy: Secondary | ICD-10-CM | POA: Insufficient documentation

## 2023-09-20 DIAGNOSIS — Y908 Blood alcohol level of 240 mg/100 ml or more: Secondary | ICD-10-CM | POA: Insufficient documentation

## 2023-09-20 DIAGNOSIS — F1029 Alcohol dependence with unspecified alcohol-induced disorder: Secondary | ICD-10-CM | POA: Diagnosis present

## 2023-09-20 DIAGNOSIS — R079 Chest pain, unspecified: Secondary | ICD-10-CM | POA: Diagnosis not present

## 2023-09-20 LAB — CBC WITH DIFFERENTIAL/PLATELET
Abs Immature Granulocytes: 0.01 10*3/uL (ref 0.00–0.07)
Basophils Absolute: 0.1 10*3/uL (ref 0.0–0.1)
Basophils Relative: 1 %
Eosinophils Absolute: 0 10*3/uL (ref 0.0–0.5)
Eosinophils Relative: 0 %
HCT: 41 % (ref 39.0–52.0)
Hemoglobin: 13.9 g/dL (ref 13.0–17.0)
Immature Granulocytes: 0 %
Lymphocytes Relative: 34 %
Lymphs Abs: 1.7 10*3/uL (ref 0.7–4.0)
MCH: 32.6 pg (ref 26.0–34.0)
MCHC: 33.9 g/dL (ref 30.0–36.0)
MCV: 96 fL (ref 80.0–100.0)
Monocytes Absolute: 0.3 10*3/uL (ref 0.1–1.0)
Monocytes Relative: 7 %
Neutro Abs: 2.8 10*3/uL (ref 1.7–7.7)
Neutrophils Relative %: 58 %
Platelets: 255 10*3/uL (ref 150–400)
RBC: 4.27 MIL/uL (ref 4.22–5.81)
RDW: 11.5 % (ref 11.5–15.5)
WBC: 4.8 10*3/uL (ref 4.0–10.5)
nRBC: 0 % (ref 0.0–0.2)

## 2023-09-20 LAB — COMPREHENSIVE METABOLIC PANEL
ALT: 88 U/L — ABNORMAL HIGH (ref 0–44)
AST: 66 U/L — ABNORMAL HIGH (ref 15–41)
Albumin: 4.8 g/dL (ref 3.5–5.0)
Alkaline Phosphatase: 44 U/L (ref 38–126)
Anion gap: 18 — ABNORMAL HIGH (ref 5–15)
BUN: 19 mg/dL (ref 6–20)
CO2: 21 mmol/L — ABNORMAL LOW (ref 22–32)
Calcium: 10 mg/dL (ref 8.9–10.3)
Chloride: 99 mmol/L (ref 98–111)
Creatinine, Ser: 1.35 mg/dL — ABNORMAL HIGH (ref 0.61–1.24)
GFR, Estimated: >60 mL/min (ref >60)
Glucose, Bld: 118 mg/dL — ABNORMAL HIGH (ref 70–99)
Potassium: 4 mmol/L (ref 3.5–5.1)
Sodium: 138 mmol/L (ref 135–145)
Total Bilirubin: 0.9 mg/dL (ref 0.3–1.2)
Total Protein: 7.4 g/dL (ref 6.5–8.1)

## 2023-09-20 LAB — ETHANOL: Alcohol, Ethyl (B): 296 mg/dL — ABNORMAL HIGH (ref <10)

## 2023-09-20 MED ORDER — CHLORDIAZEPOXIDE HCL 25 MG PO CAPS: ORAL_CAPSULE | ORAL | 0 refills | Status: AC

## 2023-09-20 MED ORDER — FOLIC ACID 1 MG PO TABS
1.0000 mg | ORAL_TABLET | Freq: Every day | ORAL | Status: DC
  Administered 2023-09-20: 1 mg via ORAL
  Filled 2023-09-20: qty 1

## 2023-09-20 MED ORDER — THIAMINE HCL 100 MG/ML IJ SOLN
100.0000 mg | Freq: Every day | INTRAMUSCULAR | Status: DC
  Administered 2023-09-20: 100 mg via INTRAVENOUS
  Filled 2023-09-20: qty 2

## 2023-09-20 MED ORDER — MIDAZOLAM HCL 2 MG/2ML IJ SOLN: 2.0000 mg | Freq: Once | INTRAMUSCULAR | Status: DC

## 2023-09-20 MED ORDER — THIAMINE MONONITRATE 100 MG PO TABS
100.0000 mg | ORAL_TABLET | Freq: Every day | ORAL | Status: DC
  Filled 2023-09-20: qty 1

## 2023-09-20 MED ORDER — ADULT MULTIVITAMIN W/MINERALS CH
1.0000 | ORAL_TABLET | Freq: Every day | ORAL | Status: DC
  Administered 2023-09-20: 1 via ORAL
  Filled 2023-09-20: qty 1

## 2023-09-20 MED ORDER — LORAZEPAM 1 MG PO TABS: 1.0000 mg | ORAL_TABLET | ORAL | Status: DC | PRN

## 2023-09-20 MED ORDER — CHLORDIAZEPOXIDE HCL 25 MG PO CAPS
100.0000 mg | ORAL_CAPSULE | Freq: Once | ORAL | Status: AC
  Administered 2023-09-20: 100 mg via ORAL
  Filled 2023-09-20: qty 4

## 2023-09-20 MED ORDER — LORAZEPAM 2 MG/ML IJ SOLN
1.0000 mg | INTRAMUSCULAR | Status: DC | PRN
  Administered 2023-09-20: 3 mg via INTRAVENOUS
  Filled 2023-09-20: qty 2
  Filled 2023-09-20: qty 1

## 2023-09-20 NOTE — ED Notes (Signed)
Dr. Adela Lank at the bedside to answer pt's questions regarding plan of care and what to expect for length of stay

## 2023-09-20 NOTE — ED Provider Triage Note (Signed)
Emergency Medicine Provider Triage Evaluation Note  Fender Stansberry [facility]. , a 37 y.o. male  was evaluated in triage.  Pt complains of withdrawing.  Drinks about 1/5 a day, states he is having auditory and visual hallucinations now, and has nausea, vomiting, diarrhea.  Last drink was at 10 AM today.  History of seizures with withdrawal.  Review of Systems  Positive: Hallucinations Negative: Suicidal ideation  Physical Exam  BP 114/67   Pulse 90   Temp 97.9 F (36.6 C)   Resp 18   SpO2 98%  Gen:   Awake, no distress   Resp:  Normal effort  MSK:   Moves extremities without difficulty  Other:  Tremulous  Medical Decision Making  Medically screening exam initiated at 6:55 PM.  Appropriate orders placed.  Ledell Peoples Clarin Jr. was informed that the remainder of the evaluation will be completed by another provider, this initial triage assessment does not replace that evaluation, and the importance of remaining in the ED until their evaluation is complete.  Informed charge RN, patient has CIWA of 18, will need room.  For further monitoring.   Pete Pelt, [place] 09/20/23 1902

## 2023-09-20 NOTE — ED Triage Notes (Signed)
Patient BIB [facility] from hotel, similar to previous admission patient needs alcohol detox d/t hallucinations, cold sweats, shaking, this time however patient's job has cleared him to take time off for recovery. Patient has hx of same, last drink this morning. Currently A&Ox4, c/o headache.

## 2023-09-20 NOTE — ED Notes (Signed)
Sort NT Adam [name] is aware of pt is in no shape to drive himself, pt is waiting on his father, advised pt has a personal vehicle outside, would defer pt leaving by himself, should wait for his father to drive him him, father reported on phone to this RN he was on the way.

## 2023-09-20 NOTE — Progress Notes (Signed)
Patient is pending medical work-up. Disposition still pending.

## 2023-09-20 NOTE — ED Notes (Signed)
Was able to reach pt's father, Pharrell Silverthorne Sr. Advised pt needs a sober driver to get home, pt's father advised he will come get pt here in a few minutes, advise pt will be in [facility] lobby, advised Engineer, materials in [facility] lobby that pt is waiting on sober driver and should defer pt from leaving in his own personal vehicle.

## 2023-09-20 NOTE — ED Provider Notes (Signed)
[place] EMERGENCY DEPARTMENT AT [facility] Provider Note   CSN: [id number] Arrival date & time: 09/20/23  1823     History  Chief Complaint  Patient presents with   Alcohol Intoxication    Adam [name]. is a 37 y.o. male.  37 yo M with a chief complaints of wanting alcohol rehabilitation.  The patient was just here and left AGAINST MEDICAL ADVICE after being in the hospital for a few days.  He tells me that he is now arranged with his work that he can stay as long as he needs to.  Feels that he has been having some chest pain as well.  Send in the epigastrium and left lateral aspect.  Seems to come and go.  Denies any trauma.   Alcohol Intoxication       Home Medications Prior to Admission medications   Medication Sig Start Date End Date Taking? Authorizing Provider  chlordiazePOXIDE (LIBRIUM) 25 MG capsule 50mg  PO TID x 1D, then 25-50mg  PO BID X 1D, then 25-50mg  PO QD X 1D 09/20/23   Melene Plan, DO  Cyanocobalamin (VITAMIN B12 PO) Take 1 tablet by mouth daily.    [provider]  folic acid (FOLVITE) 1 MG tablet Take 1 tablet (1 mg total) by mouth daily. 07/07/23   Suzan Slick, MD  lisinopril (ZESTRIL) 30 MG tablet Take 1 tablet (30 mg total) by mouth daily. 07/07/23   Suzan Slick, MD  thiamine (VITAMIN B1) 100 MG tablet Take 1 tablet (100 mg total) by mouth daily. 07/07/23   Suzan Slick, MD      Allergies    Bee venom    Review of Systems   Review of Systems  Physical Exam Updated Vital Signs BP 108/65   Pulse 94   Temp 97.9 F (36.6 C)   Resp 14   SpO2 97%  Physical Exam Vitals and nursing note reviewed.  Constitutional:      Appearance: He is well-developed.  HENT:     Head: Normocephalic and atraumatic.  Eyes:     Pupils: Pupils are equal, round, and reactive to light.  Neck:     Vascular: No JVD.  Cardiovascular:     Rate and Rhythm: Normal rate and regular rhythm.     Heart sounds: No murmur  heard.    No friction rub. No gallop.  Pulmonary:     Effort: No respiratory distress.     Breath sounds: No wheezing.  Abdominal:     General: There is no distension.     Tenderness: There is no abdominal tenderness. There is no guarding or rebound.  Musculoskeletal:        General: Normal range of motion.     Cervical back: Normal range of motion and neck supple.  Skin:    Coloration: Skin is not pale.     Findings: No rash.  Neurological:     Mental Status: He is alert and oriented to person, place, and time.  Psychiatric:        Behavior: Behavior normal.     ED Results / Procedures / Treatments   Labs (all labs ordered are listed, but only abnormal results are displayed) Labs Reviewed  ETHANOL - Abnormal; Notable for the following components:      Result Value   Alcohol, Ethyl (B) 296 (*)    All other components within normal limits  COMPREHENSIVE METABOLIC PANEL - Abnormal; Notable for the following components:   CO2  21 (*)    Glucose, Bld 118 (*)    Creatinine, Ser 1.35 (*)    AST 66 (*)    ALT 88 (*)    Anion gap 18 (*)    All other components within normal limits  CBC WITH DIFFERENTIAL/PLATELET  RAPID URINE DRUG SCREEN, HOSP PERFORMED  URINALYSIS, ROUTINE W REFLEX MICROSCOPIC    EKG None  Radiology No results found.  Procedures Procedures    Medications Ordered in ED Medications  LORazepam (ATIVAN) tablet 1-4 mg ( Oral See Alternative 09/20/23 2022)    Or  LORazepam (ATIVAN) injection 1-4 mg (3 mg Intravenous Given 09/20/23 2022)  thiamine (VITAMIN B1) tablet 100 mg ( Oral See Alternative 09/20/23 2016)    Or  thiamine (VITAMIN B1) injection 100 mg (100 mg Intravenous Given 09/20/23 2016)  folic acid (FOLVITE) tablet 1 mg (1 mg Oral Given 09/20/23 2016)  multivitamin with minerals tablet 1 tablet (1 tablet Oral Given 09/20/23 2017)  chlordiazePOXIDE (LIBRIUM) capsule 100 mg (has no administration in time range)    ED Course/ Medical Decision  Making/ A&P                                 Medical Decision Making Risk Prescription drug management.   37 yo M with a chief complaints of concern for withdrawal from alcohol.  Patient has been seen multiple times in the ED recently.  He was admitted and then left AGAINST MEDICAL ADVICE during his stay.  He is not clinically in withdrawal for me.  He has no tremors he has no tachycardia he has no hypertension.  His alcohol level is almost 300.  He is able to talk with me and make decisions.  I do not feel that he is actively withdrawing from alcohol.  I discussed with him that our typical policy is to have him try to follow-up as an outpatient as inpatient rehab does not seem to be successful and was not for him most recently.  He had a laboratory evaluation here with no significant changes renal function.  He has an anion gap and no significant anemia.  Will discharge the patient home.  Given outpatient resources.  Librium taper.  8:50 PM:  I have discussed the diagnosis/risks/treatment options with the patient.  Evaluation and diagnostic testing in the emergency department does not suggest an emergent condition requiring admission or immediate intervention beyond what has been performed at this time.  They will follow up with PCP. We also discussed returning to the ED immediately if new or worsening sx occur. We discussed the sx which are most concerning (e.g., sudden worsening pain, fever, inability to tolerate by mouth) that necessitate immediate return. Medications administered to the patient during their visit and any new prescriptions provided to the patient are listed below.  Medications given during this visit Medications  LORazepam (ATIVAN) tablet 1-4 mg ( Oral See Alternative 09/20/23 2022)    Or  LORazepam (ATIVAN) injection 1-4 mg (3 mg Intravenous Given 09/20/23 2022)  thiamine (VITAMIN B1) tablet 100 mg ( Oral See Alternative 09/20/23 2016)    Or  thiamine (VITAMIN B1) injection  100 mg (100 mg Intravenous Given 09/20/23 2016)  folic acid (FOLVITE) tablet 1 mg (1 mg Oral Given 09/20/23 2016)  multivitamin with minerals tablet 1 tablet (1 tablet Oral Given 09/20/23 2017)  chlordiazePOXIDE (LIBRIUM) capsule 100 mg (has no administration in time range)  The patient appears reasonably screen and/or stabilized for discharge and I doubt any other medical condition or other [facility] requiring further screening, evaluation, or treatment in the ED at this time prior to discharge.          Final Clinical Impression(s) / ED Diagnoses Final diagnoses:  Alcohol dependence with unspecified alcohol-induced disorder [facility])    Rx / DC Orders ED Discharge Orders          Ordered    chlordiazePOXIDE (LIBRIUM) 25 MG capsule        09/20/23 2048              Melene Plan, DO 09/20/23 2050

## 2023-09-20 NOTE — Discharge Instructions (Addendum)
Please try to follow-up with an outpatient center to see if they can help you with your issues with drinking.

## 2024-01-10 ENCOUNTER — Emergency Department (HOSPITAL_BASED_OUTPATIENT_CLINIC_OR_DEPARTMENT_OTHER)
Admission: EM | Admit: 2024-01-10 | Discharge: 2024-01-10 | Disposition: A | Discharge status: Home / Self Care | Payer: MEDICAID | Attending: Emergency Medicine | Admitting: Emergency Medicine

## 2024-01-10 ENCOUNTER — Emergency Department (HOSPITAL_BASED_OUTPATIENT_CLINIC_OR_DEPARTMENT_OTHER): Payer: MEDICAID

## 2024-01-10 ENCOUNTER — Emergency Department (HOSPITAL_BASED_OUTPATIENT_CLINIC_OR_DEPARTMENT_OTHER): Payer: MEDICAID | Admitting: Radiology

## 2024-01-10 ENCOUNTER — Other Ambulatory Visit: Payer: Self-pay

## 2024-01-10 ENCOUNTER — Encounter (HOSPITAL_BASED_OUTPATIENT_CLINIC_OR_DEPARTMENT_OTHER): Payer: Self-pay | Admitting: Emergency Medicine

## 2024-01-10 DIAGNOSIS — I10 Essential (primary) hypertension: Secondary | ICD-10-CM | POA: Insufficient documentation

## 2024-01-10 DIAGNOSIS — Z23 Encounter for immunization: Secondary | ICD-10-CM | POA: Diagnosis not present

## 2024-01-10 DIAGNOSIS — Z79899 Other long term (current) drug therapy: Secondary | ICD-10-CM | POA: Diagnosis not present

## 2024-01-10 DIAGNOSIS — R7989 Other specified abnormal findings of blood chemistry: Secondary | ICD-10-CM | POA: Diagnosis not present

## 2024-01-10 DIAGNOSIS — W450XXA Nail entering through skin, initial encounter: Secondary | ICD-10-CM | POA: Insufficient documentation

## 2024-01-10 DIAGNOSIS — H538 Other visual disturbances: Secondary | ICD-10-CM | POA: Diagnosis not present

## 2024-01-10 DIAGNOSIS — R0789 Other chest pain: Secondary | ICD-10-CM | POA: Insufficient documentation

## 2024-01-10 DIAGNOSIS — S91332A Puncture wound without foreign body, left foot, initial encounter: Secondary | ICD-10-CM | POA: Diagnosis not present

## 2024-01-10 DIAGNOSIS — R42 Dizziness and giddiness: Secondary | ICD-10-CM | POA: Insufficient documentation

## 2024-01-10 DIAGNOSIS — R0602 Shortness of breath: Secondary | ICD-10-CM | POA: Insufficient documentation

## 2024-01-10 DIAGNOSIS — M79672 Pain in left foot: Secondary | ICD-10-CM | POA: Diagnosis present

## 2024-01-10 LAB — BASIC METABOLIC PANEL
Anion gap: 10 (ref 5–15)
BUN: 18 mg/dL (ref 6–20)
CO2: 31 mmol/L (ref 22–32)
Calcium: 10.3 mg/dL (ref 8.9–10.3)
Chloride: 94 mmol/L — ABNORMAL LOW (ref 98–111)
Creatinine, Ser: 1.62 mg/dL — ABNORMAL HIGH (ref 0.61–1.24)
GFR, Estimated: 56 mL/min — ABNORMAL LOW (ref >60)
Glucose, Bld: 92 mg/dL (ref 70–99)
Potassium: 4.1 mmol/L (ref 3.5–5.1)
Sodium: 135 mmol/L (ref 135–145)

## 2024-01-10 LAB — CBC
HCT: 46.4 % (ref 39.0–52.0)
Hemoglobin: 16.1 g/dL (ref 13.0–17.0)
MCH: 30.7 pg (ref 26.0–34.0)
MCHC: 34.7 g/dL (ref 30.0–36.0)
MCV: 88.5 fL (ref 80.0–100.0)
Platelets: 302 10*3/uL (ref 150–400)
RBC: 5.24 MIL/uL (ref 4.22–5.81)
RDW: 10.7 % — ABNORMAL LOW (ref 11.5–15.5)
WBC: 4.4 10*3/uL (ref 4.0–10.5)
nRBC: 0 % (ref 0.0–0.2)

## 2024-01-10 LAB — URINALYSIS, ROUTINE W REFLEX MICROSCOPIC
Bacteria, UA: NONE SEEN
Bilirubin Urine: NEGATIVE
Glucose, UA: NEGATIVE mg/dL
Hgb urine dipstick: NEGATIVE
Ketones, ur: 15 mg/dL — AB
Leukocytes,Ua: NEGATIVE
Nitrite: NEGATIVE
Protein, ur: 30 mg/dL — AB
Specific Gravity, Urine: 1.03 (ref 1.005–1.030)
pH: 6 (ref 5.0–8.0)

## 2024-01-10 LAB — HEPATIC FUNCTION PANEL
ALT: 41 U/L (ref 0–44)
AST: 24 U/L (ref 15–41)
Albumin: 4.9 g/dL (ref 3.5–5.0)
Alkaline Phosphatase: 66 U/L (ref 38–126)
Bilirubin, Direct: 0.1 mg/dL (ref 0.0–0.2)
Indirect Bilirubin: 0.5 mg/dL (ref 0.3–0.9)
Total Bilirubin: 0.6 mg/dL (ref 0.0–1.2)
Total Protein: 8.7 g/dL — ABNORMAL HIGH (ref 6.5–8.1)

## 2024-01-10 LAB — LIPASE, BLOOD: Lipase: 18 U/L (ref 11–51)

## 2024-01-10 LAB — CBG MONITORING, ED: Glucose-Capillary: 71 mg/dL (ref 70–99)

## 2024-01-10 LAB — TROPONIN I (HIGH SENSITIVITY): Troponin I (High Sensitivity): 5 ng/L (ref <18)

## 2024-01-10 MED ORDER — SODIUM CHLORIDE 0.9 % IV BOLUS
1000.0000 mL | Freq: Once | INTRAVENOUS | Status: AC
  Administered 2024-01-10: 1000 mL via INTRAVENOUS

## 2024-01-10 MED ORDER — TETANUS-DIPHTH-ACELL PERTUSSIS 5-2.5-18.5 LF-MCG/0.5 IM SUSY
0.5000 mL | PREFILLED_SYRINGE | Freq: Once | INTRAMUSCULAR | Status: AC
  Administered 2024-01-10: 0.5 mL via INTRAMUSCULAR
  Filled 2024-01-10: qty 0.5

## 2024-01-10 NOTE — ED Triage Notes (Signed)
Reports dizziness, lightheadedness, fatigue  Reports low BP at home 90s/50s X a few weeks Some chest pain and constant pain on left side rib areas x 3 months   Hx of etoh but not drinking in a while, has not been taking home BP meds due to low BP Reports eating and drinking ok, but some bloating

## 2024-01-10 NOTE — ED Notes (Signed)
Patient refusing IV and IV fluids. States "I prefer to drink". PA made aware. Will speak to patient

## 2024-01-10 NOTE — Discharge Instructions (Signed)
You were seen today for intermittent dizziness, shortness of breath, nail puncture wound.  Your labs and imaging were all very reassuring at this time.  Your kidney function was slightly decreased which is probably due to your alcohol intake yesterday recommend they continue to hydrate well and have a follow-up lab done with your primary care within the week to repeat evaluate.  Recommend that you follow-up with PCP for further workup at this time as low suspicion for any emergent cause at this time due to reassuring labs, physical exam, imaging.  Return to the ED for any new or worsening symptoms.
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
## 2024-01-19 ENCOUNTER — Encounter: Payer: Self-pay | Admitting: Family Medicine

## 2024-01-19 ENCOUNTER — Ambulatory Visit (INDEPENDENT_AMBULATORY_CARE_PROVIDER_SITE_OTHER): Payer: MEDICAID | Admitting: Family Medicine

## 2024-01-19 VITALS — BP 117/68 | HR 72 | Temp 97.9°F | Resp 18 | Ht 67.0 in | Wt 162.8 lb

## 2024-01-19 DIAGNOSIS — I10 Essential (primary) hypertension: Secondary | ICD-10-CM

## 2024-01-19 DIAGNOSIS — R42 Dizziness and giddiness: Secondary | ICD-10-CM | POA: Diagnosis not present

## 2024-01-19 DIAGNOSIS — H9313 Tinnitus, bilateral: Secondary | ICD-10-CM

## 2024-01-19 DIAGNOSIS — R1032 Left lower quadrant pain: Secondary | ICD-10-CM

## 2024-01-19 MED ORDER — LISINOPRIL 20 MG PO TABS: 20.0000 mg | ORAL_TABLET | Freq: Every day | ORAL | 1 refills | Status: DC

## 2024-01-19 NOTE — Progress Notes (Signed)
 Established Patient Office Visit  Subjective   Patient ID: Adam [name]., male    DOB: 1986/03/10  Age: 38 y.o. MRN: [id number]  Chief Complaint  Patient presents with   Dizziness    Patient states that he is here for dizziness and states that it has been happening for a while he states that it comes and goes and happens when he is working   Tinnitus    Patient states that he is still dealing with the ringing in his right ear, he states that he did not follow up as he was supposed to and states that it has not improved    Dizziness Associated symptoms include abdominal pain. Pertinent negatives include no nausea or vomiting.   Tinnitus Pt was seen last year for ringing in his right ear. He was referred to ENT last year in August 2024 and he had appt in October but no showed.  He reports he has ringing in both ears but right ear is worse. He reports he always has ringing. He was seen in ER on 2/18 for dizziness. He had ct scan of head and was negative. Labs done and was essentially normal.  He reports dizziness started a few months ago. He reports when he is doing activity or working, he has dizziness. He reports it doesn't happen when he moves his head. He says his balance is off.   He reports he has constant side on his left side. It's been present for the several months. He reports normal bowel movements. No dysuria or urinary symptoms.   Hypertension Pt has been on Lisinopril 30mg  daily. He reports his blood pressure was on the lower side a few weeks ago. He reports he wasn't drinking any alcohol at that time.    Review of Systems  HENT:  Positive for tinnitus.   Gastrointestinal:  Positive for abdominal pain. Negative for blood in stool, constipation, diarrhea, heartburn, melena, nausea and vomiting.  Genitourinary:  Positive for dysuria. Negative for urgency.  Neurological:  Positive for dizziness.  All other systems reviewed and are negative.    Objective:      There were no vitals taken for this visit. BP Readings from Last 3 Encounters:  01/19/24 117/68  01/10/24 113/72  09/20/23 108/65      Physical Exam Vitals and nursing note reviewed.  Constitutional:      Appearance: Normal appearance. He is normal weight.  HENT:     Head: Normocephalic and atraumatic.     Right Ear: Tympanic membrane, ear canal and external ear normal.     Left Ear: Tympanic membrane, ear canal and external ear normal.     Nose: Nose normal.     Mouth/Throat:     Mouth: Mucous membranes are moist.     Pharynx: Oropharynx is clear.  Eyes:     Conjunctiva/sclera: Conjunctivae normal.     Pupils: Pupils are equal, round, and reactive to light.  Cardiovascular:     Rate and Rhythm: Normal rate and regular rhythm.     Pulses: Normal pulses.     Heart sounds: Normal heart sounds.  Pulmonary:     Effort: Pulmonary effort is normal.     Breath sounds: Normal breath sounds.  Abdominal:     General: Abdomen is flat. Bowel sounds are normal.  Skin:    General: Skin is warm.     Capillary Refill: Capillary refill takes less than 2 seconds.  Neurological:     General: No  focal deficit present.     Mental Status: He is alert and oriented to person, place, and time. Mental status is at baseline.  Psychiatric:        Mood and Affect: Mood normal.        Behavior: Behavior normal.        Thought Content: Thought content normal.        Judgment: Judgment normal.    No results found for any visits on 01/19/24.  Last CBC Lab Results  Component Value Date   WBC 4.4 01/10/2024   HGB 16.1 01/10/2024   HCT 46.4 01/10/2024   MCV 88.5 01/10/2024   MCH 30.7 01/10/2024   RDW 10.7 (L) 01/10/2024   PLT 302 01/10/2024   Last metabolic panel Lab Results  Component Value Date   GLUCOSE 92 01/10/2024   NA 135 01/10/2024   K 4.1 01/10/2024   CL 94 (L) 01/10/2024   CO2 31 01/10/2024   BUN 18 01/10/2024   CREATININE 1.62 (H) 01/10/2024   GFRNONAA 56 (L) 01/10/2024    CALCIUM 10.3 01/10/2024   PHOS 3.2 09/08/2023   PROT 8.7 (H) 01/10/2024   ALBUMIN 4.9 01/10/2024   BILITOT 0.6 01/10/2024   ALKPHOS 66 01/10/2024   AST 24 01/10/2024   ALT 41 01/10/2024   ANIONGAP 10 01/10/2024      The ASCVD Risk score (Arnett DK, et al., 2019) failed to calculate for the following reasons:   The 2019 ASCVD risk score is only valid for ages 29 to 41    Assessment & Plan:   Problem List Items Addressed This Visit   None Dizziness -     Ambulatory referral to ENT  Tinnitus of both ears -     Ambulatory referral to ENT  Left lower quadrant pain -     DG Abd 2 Views; Future  Primary hypertension -     Lisinopril; Take 1 tablet (20 mg total) by mouth daily.  Dispense: 90 tablet; Refill: 1   Due to dizziness and tinnitus,could be ear related? Will refer to Ent for further evaluation. Pt with lower left quadrant pain for months of unknown etiology. Will refer for abdominal xray. He has HTN and has been on Lisinopril 30mg . He noted low blood pressures a few weeks ago. Today is lower than 120 sbp. Will bump this dose down some to 20mg  daily and monitor. Will follow up in 3 months sooner pending xray results.  No follow-ups on file.    Suzan Slick, MD

## 2024-01-26 IMAGING — US US ABDOMEN LIMITED
1 series · 14 of 25 positions shown · non-contrast
Comparison: None Available.

CLINICAL DATA: Pain.  Nausea vomiting.

EXAM:
ULTRASOUND ABDOMEN LIMITED RIGHT UPPER QUADRANT

[Series 1: us abdomen limited ruq (liver/gb) · 14 of 50 slices shown]
[im 1/50]
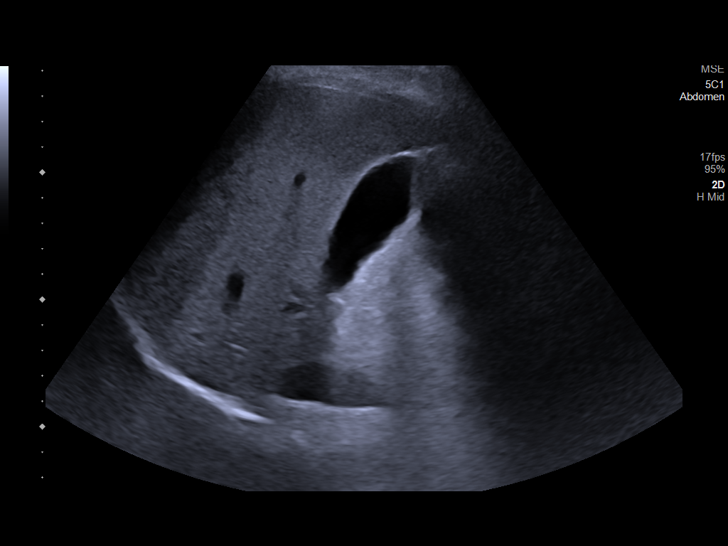
[im 5/50]
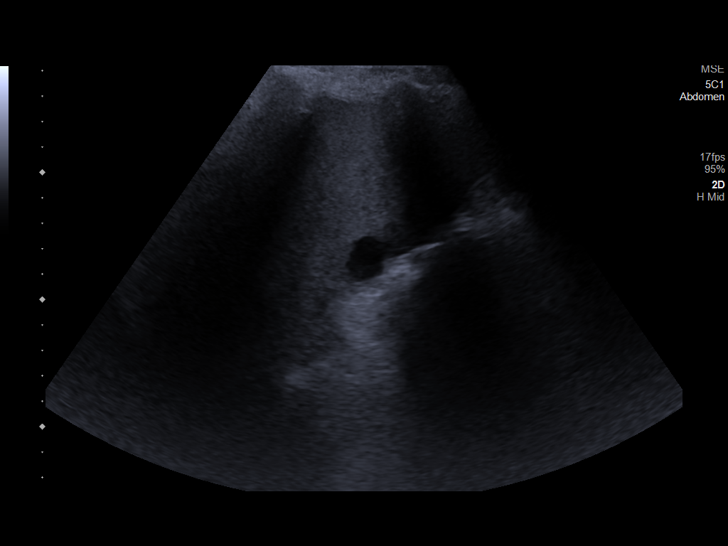
[im 9/50]
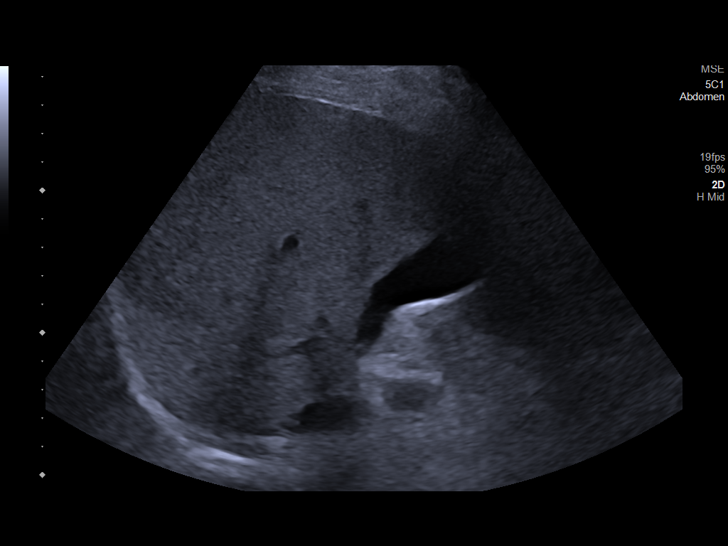
[im 13/50]
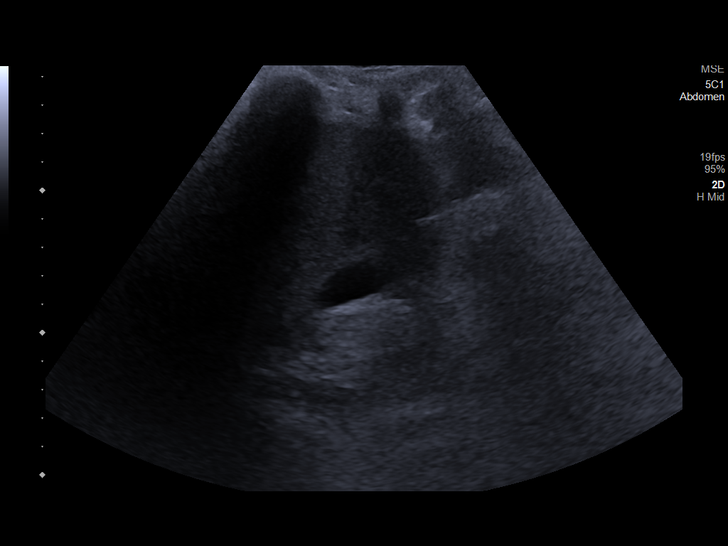
[im 17/50]
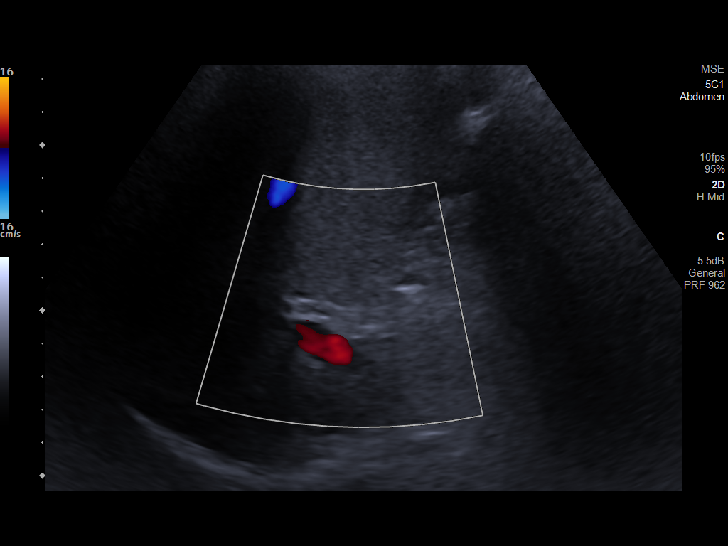
[im 19/50]
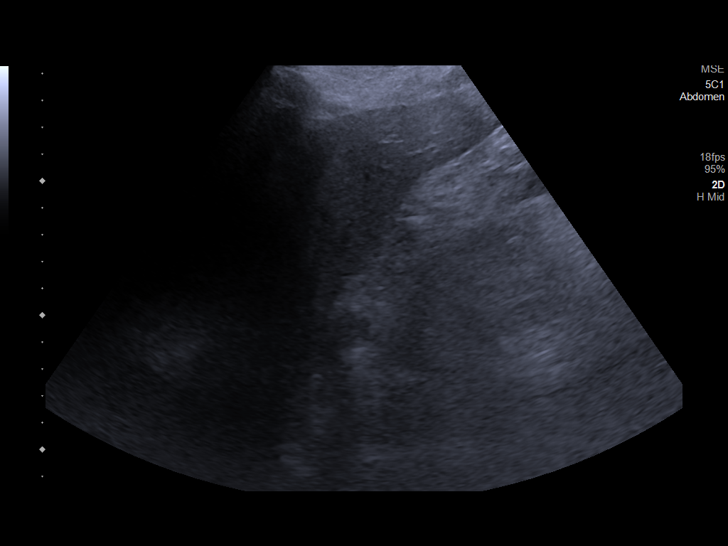
[im 23/50]
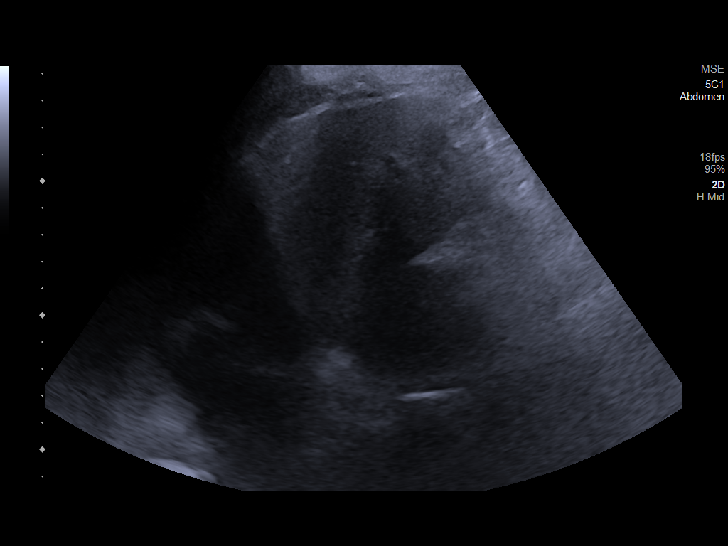
[im 27/50]
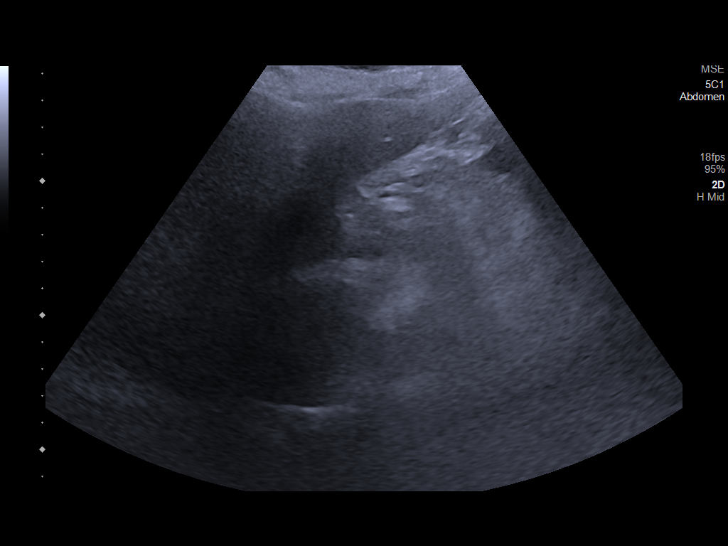
[im 31/50]
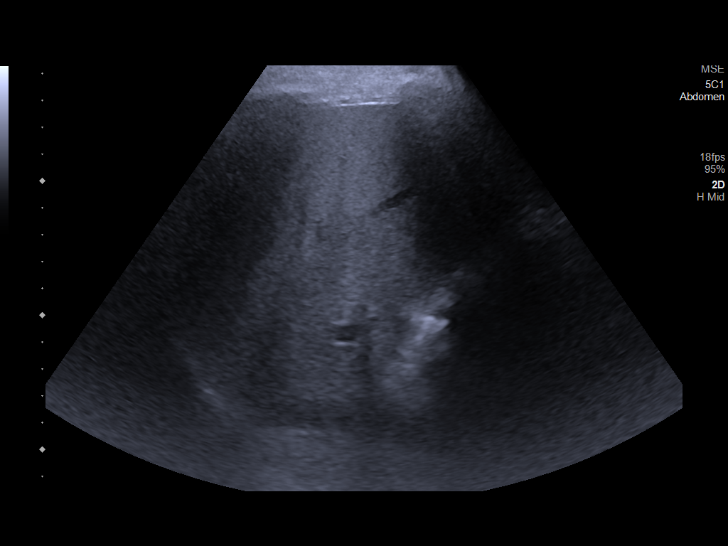
[im 33/50]
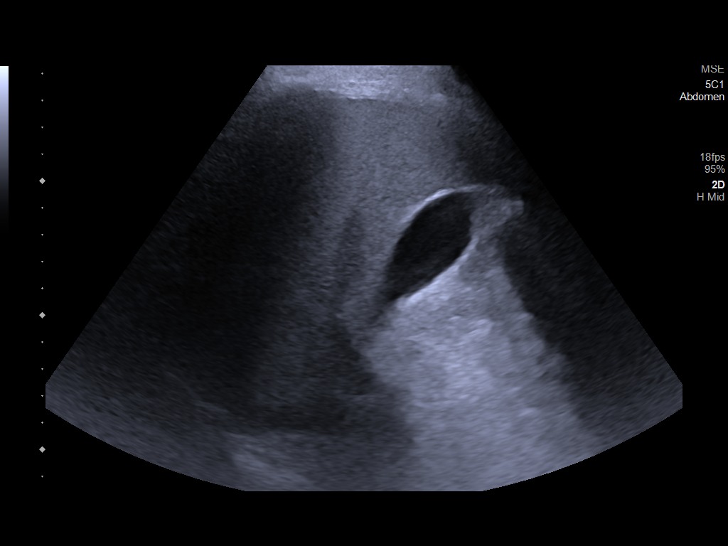
[im 37/50]
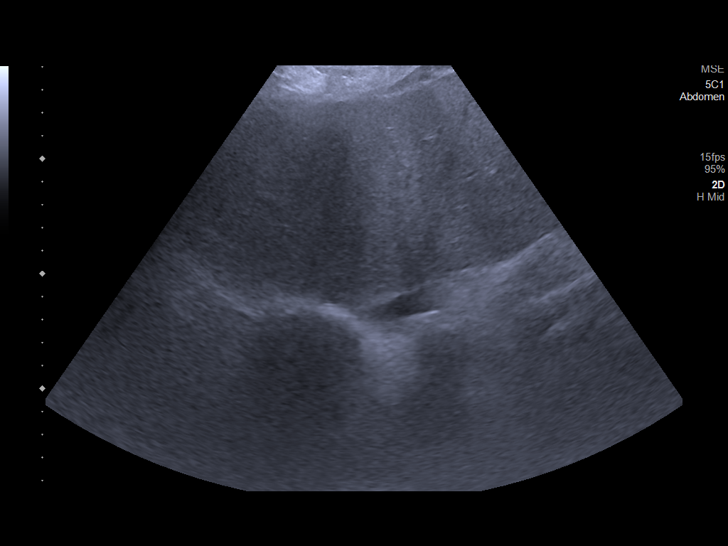
[im 41/50]
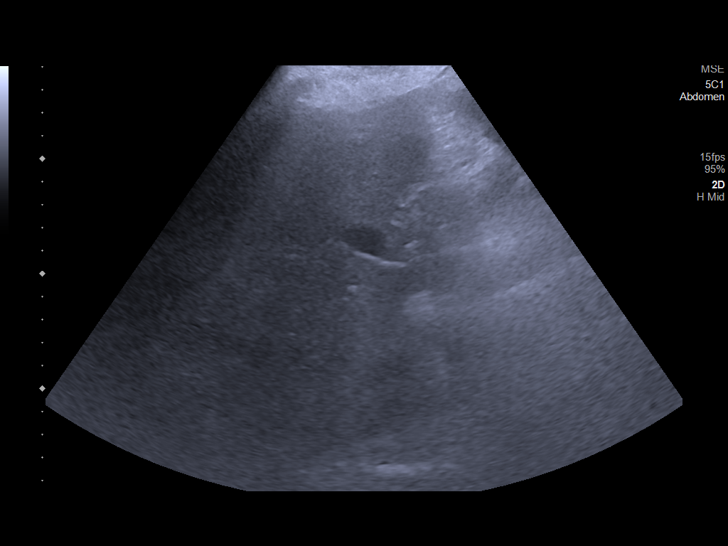
[im 45/50]
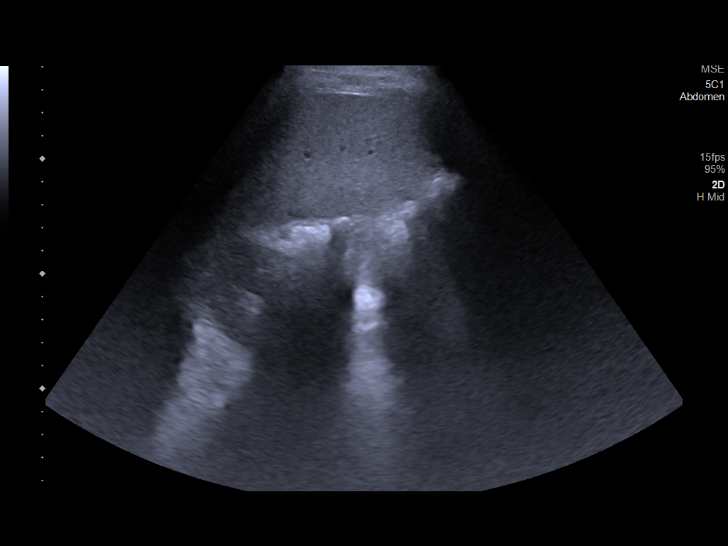
[im 50/50]
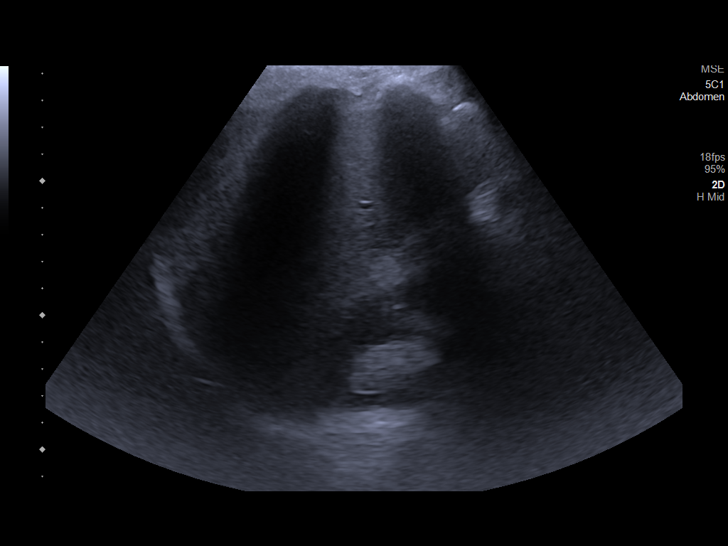

[14 of 25 positions shown; findings below may reference images not displayed]

FINDINGS: Gallbladder:

No gallstones or wall thickening visualized. No sonographic Murphy
sign noted by sonographer.

Common bile duct:

Diameter: 4.2 mm

Liver:

Diffuse increased echogenicity. No focal mass. Portal vein is patent
on color Doppler imaging with normal direction of blood flow towards
the liver.

Other: None.
IMPRESSION: 1. No cause for pain identified.
2. Diffuse increased echogenicity in the liver is nonspecific but
often due to hepatic steatosis.
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
## 2024-04-03 ENCOUNTER — Telehealth (INDEPENDENT_AMBULATORY_CARE_PROVIDER_SITE_OTHER): Payer: Self-pay | Admitting: Otolaryngology

## 2024-04-03 NOTE — Telephone Encounter (Signed)
 LVM for patient to call back to reschedule Dr. Basilio Both appointment - provider not in office. [phone] afm

## 2024-04-05 ENCOUNTER — Ambulatory Visit (INDEPENDENT_AMBULATORY_CARE_PROVIDER_SITE_OTHER): Payer: MEDICAID | Admitting: Audiology

## 2024-04-05 ENCOUNTER — Institutional Professional Consult (permissible substitution) (INDEPENDENT_AMBULATORY_CARE_PROVIDER_SITE_OTHER): Payer: MEDICAID | Admitting: Otolaryngology

## 2024-06-04 ENCOUNTER — Telehealth: Payer: Self-pay | Admitting: Family Medicine

## 2024-06-04 DIAGNOSIS — I10 Essential (primary) hypertension: Secondary | ICD-10-CM

## 2024-06-04 MED ORDER — LISINOPRIL 20 MG PO TABS: 20.0000 mg | ORAL_TABLET | Freq: Every day | ORAL | 1 refills | Status: AC
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
